# Patient Record
Sex: Male | Born: 1960 | Race: White | Hispanic: No | Marital: Married | Smoking: Never smoker
Health system: Southern US, Community
[De-identification: ages and names within clinical notes are randomized; demographics above are authoritative.]

## PROBLEM LIST (undated history)

## (undated) DIAGNOSIS — R0600 Dyspnea, unspecified: Secondary | ICD-10-CM

## (undated) DIAGNOSIS — T8859XA Other complications of anesthesia, initial encounter: Secondary | ICD-10-CM

## (undated) DIAGNOSIS — I4891 Unspecified atrial fibrillation: Secondary | ICD-10-CM

## (undated) DIAGNOSIS — N289 Disorder of kidney and ureter, unspecified: Secondary | ICD-10-CM

## (undated) DIAGNOSIS — M1990 Unspecified osteoarthritis, unspecified site: Secondary | ICD-10-CM

## (undated) DIAGNOSIS — T4145XA Adverse effect of unspecified anesthetic, initial encounter: Secondary | ICD-10-CM

## (undated) DIAGNOSIS — M109 Gout, unspecified: Secondary | ICD-10-CM

## (undated) DIAGNOSIS — N186 End stage renal disease: Secondary | ICD-10-CM

## (undated) DIAGNOSIS — R7303 Prediabetes: Secondary | ICD-10-CM

## (undated) DIAGNOSIS — C4492 Squamous cell carcinoma of skin, unspecified: Secondary | ICD-10-CM

## (undated) DIAGNOSIS — I5032 Chronic diastolic (congestive) heart failure: Secondary | ICD-10-CM

## (undated) DIAGNOSIS — I10 Essential (primary) hypertension: Secondary | ICD-10-CM

## (undated) DIAGNOSIS — H269 Unspecified cataract: Secondary | ICD-10-CM

## (undated) DIAGNOSIS — R42 Dizziness and giddiness: Secondary | ICD-10-CM

## (undated) DIAGNOSIS — I77810 Thoracic aortic ectasia: Secondary | ICD-10-CM

## (undated) DIAGNOSIS — E785 Hyperlipidemia, unspecified: Secondary | ICD-10-CM

## (undated) DIAGNOSIS — I480 Paroxysmal atrial fibrillation: Secondary | ICD-10-CM

## (undated) DIAGNOSIS — Z992 Dependence on renal dialysis: Secondary | ICD-10-CM

## (undated) DIAGNOSIS — N2881 Hypertrophy of kidney: Secondary | ICD-10-CM

## (undated) DIAGNOSIS — I499 Cardiac arrhythmia, unspecified: Secondary | ICD-10-CM

## (undated) HISTORY — DX: Squamous cell carcinoma of skin, unspecified: C44.92

## (undated) HISTORY — DX: Unspecified cataract: H26.9

## (undated) HISTORY — PX: EYE SURGERY: SHX253

## (undated) HISTORY — PX: TOTAL NEPHRECTOMY: SHX415

## (undated) HISTORY — DX: Dizziness and giddiness: R42

## (undated) HISTORY — DX: Unspecified atrial fibrillation: I48.91

## (undated) HISTORY — DX: Disorder of kidney and ureter, unspecified: N28.9

## (undated) HISTORY — PX: AV FISTULA PLACEMENT: SHX1204

## (undated) HISTORY — DX: Hypertrophy of kidney: N28.81

## (undated) HISTORY — PX: OTHER SURGICAL HISTORY: SHX169

## (undated) HISTORY — DX: Paroxysmal atrial fibrillation: I48.0

## (undated) HISTORY — PX: HERNIA REPAIR: SHX51

---

## 1999-08-03 ENCOUNTER — Emergency Department (HOSPITAL_COMMUNITY): Admission: EM | Admit: 1999-08-03 | Discharge: 1999-08-03 | Payer: Self-pay | Admitting: Emergency Medicine

## 1999-12-02 HISTORY — PX: KNEE SURGERY: SHX244

## 2000-12-03 ENCOUNTER — Emergency Department (HOSPITAL_COMMUNITY): Admission: EM | Admit: 2000-12-03 | Discharge: 2000-12-03 | Payer: Self-pay | Admitting: Emergency Medicine

## 2000-12-03 ENCOUNTER — Encounter: Payer: Self-pay | Admitting: Emergency Medicine

## 2000-12-04 ENCOUNTER — Encounter: Payer: Self-pay | Admitting: Orthopedic Surgery

## 2000-12-05 ENCOUNTER — Encounter (INDEPENDENT_AMBULATORY_CARE_PROVIDER_SITE_OTHER): Payer: Self-pay | Admitting: Specialist

## 2000-12-05 ENCOUNTER — Observation Stay (HOSPITAL_COMMUNITY): Admission: RE | Admit: 2000-12-05 | Discharge: 2000-12-06 | Payer: Self-pay | Admitting: Orthopedic Surgery

## 2001-08-15 ENCOUNTER — Emergency Department (HOSPITAL_COMMUNITY): Admission: EM | Admit: 2001-08-15 | Discharge: 2001-08-15 | Payer: Self-pay | Admitting: Emergency Medicine

## 2001-11-20 ENCOUNTER — Inpatient Hospital Stay (HOSPITAL_COMMUNITY): Admission: EM | Admit: 2001-11-20 | Discharge: 2001-11-23 | Payer: Self-pay | Admitting: Emergency Medicine

## 2001-11-20 ENCOUNTER — Encounter: Payer: Self-pay | Admitting: Cardiology

## 2006-08-10 ENCOUNTER — Emergency Department (HOSPITAL_COMMUNITY): Admission: EM | Admit: 2006-08-10 | Discharge: 2006-08-10 | Payer: Self-pay | Admitting: Emergency Medicine

## 2007-05-14 ENCOUNTER — Emergency Department (HOSPITAL_COMMUNITY): Admission: EM | Admit: 2007-05-14 | Discharge: 2007-05-14 | Payer: Self-pay | Admitting: Emergency Medicine

## 2008-08-08 ENCOUNTER — Ambulatory Visit: Payer: Self-pay | Admitting: Cardiovascular Disease

## 2008-08-15 ENCOUNTER — Ambulatory Visit: Payer: Self-pay

## 2008-08-22 ENCOUNTER — Ambulatory Visit: Payer: Self-pay

## 2009-05-04 ENCOUNTER — Encounter (INDEPENDENT_AMBULATORY_CARE_PROVIDER_SITE_OTHER): Payer: Self-pay | Admitting: *Deleted

## 2010-06-04 ENCOUNTER — Encounter (INDEPENDENT_AMBULATORY_CARE_PROVIDER_SITE_OTHER): Payer: Self-pay | Admitting: *Deleted

## 2010-12-31 NOTE — Letter (Signed)
Summary: Previsit letter  [facility]  [place], [place] [address]   Phone: [phone]  Fax: [phone]       06/04/2010 MRN: [id number]  [address] [place], [place]  [address]  Dear Mr. Verastegui,  Welcome to the Gastroenterology Division at [facility].    You are scheduled to see a nurse for your pre-procedure visit on June 17, 2010 at 1:30 PM on the 3rd floor at [facility] [place].  We ask that you try to arrive at our office 15 minutes prior to your appointment time to allow for check-in.  Your nurse visit will consist of discussing your medical and surgical history, your immediate family medical history, and your medications.    Please bring a complete list of all your medications or, if you prefer, bring the medication bottles and we will list them.  We will need to be aware of both prescribed and over the counter drugs.  We will need to know exact dosage information as well.  If you are on blood thinners (Coumadin, Plavix, Aggrenox, Ticlid, etc.) please call our office today/prior to your appointment, as we need to consult with your physician about holding your medication.   Please be prepared to read and sign documents such as consent forms, a financial agreement, and acknowledgement forms.  If necessary, and with your consent, a friend or relative is welcome to sit-in on the nurse visit with you.  Please bring your insurance card so that we may make a copy of it.  If your insurance requires a referral to see a specialist, please bring your referral form from your primary care physician.  No co-pay is required for this nurse visit.     If you cannot keep your appointment, please call [phone] to cancel or reschedule prior to your appointment date.  This allows [place] the opportunity to schedule an appointment for another patient in need of care.    Thank you for choosing [facility] for your  medical needs.  We appreciate the opportunity to care for you.  Please visit [place] at our website  to learn more about our practice.                     Sincerely.                                                                                                                   The Gastroenterology Division

## 2011-03-24 ENCOUNTER — Ambulatory Visit (INDEPENDENT_AMBULATORY_CARE_PROVIDER_SITE_OTHER): Payer: 59 | Admitting: Surgery

## 2011-03-24 ENCOUNTER — Encounter (INDEPENDENT_AMBULATORY_CARE_PROVIDER_SITE_OTHER): Payer: 59

## 2011-03-24 DIAGNOSIS — N186 End stage renal disease: Secondary | ICD-10-CM

## 2011-03-24 DIAGNOSIS — N184 Chronic kidney disease, stage 4 (severe): Secondary | ICD-10-CM

## 2011-03-24 DIAGNOSIS — Z01810 Encounter for preprocedural cardiovascular examination: Secondary | ICD-10-CM

## 2011-03-25 NOTE — Assessment & Plan Note (Signed)
OFFICE VISIT  [name] [name] DOB:  [birth year]                                       03/24/2011 CHART#:[id number]  REASON FOR VISIT:  Dialysis access.  HISTORY:  This is a very pleasant 50 year old gentleman I am seeing at the request of Dr. Lorrene [name] for dialysis access.  The patient is not yet on dialysis.  He is right-handed.  His renal failure is secondary to presumed hypertension.  The patient suffers from hypertension and hypercholesterolemia which are medically managed.  REVIEW OF SYSTEMS:  CARDIOVASCULAR:  Positive for remote history of arrhythmia. MUSCULOSKELETAL:  Positive for arthritis and joint pain.  PAST MEDICAL HISTORY:  Hypertension, hypercholesterolemia, heart murmur (Dr. Johnsie [name] is his cardiologist).  PAST SURGICAL HISTORY:  Right knee repair of ruptured patella tendon and cardiac catheterization in 2001.  SOCIAL HISTORY:  He is married, 2 children.  Does not drink or smoke.  FAMILY HISTORY:  Positive for heart disease in his mother.  ALLERGIES:  None.  PHYSICAL EXAMINATION:  Vital signs:  Heart rate 60, blood pressure 120/82, O2 sats 99%.  General:  Well-appearing, in no distress. Respirations:  Nonlabored.  Cardiovascular:  Palpable radial and left brachial pulse.  Musculoskeletal:  No major deformities.  Neurological: No focal deficits.  DIAGNOSTIC STUDIES:  Vein mapping was performed today.  He has an adequate cephalic vein beginning at the antecubital crease.  ASSESSMENT AND PLAN:  Chronic renal insufficiency needing permanent access.  Based on the patient's vein mapping I think he would be a good candidate for a left upper arm AV fistula.  I discussed the risks and benefits of the procedure including the risk of nonmaturity and the need for future interventions, the risk of steal syndrome.  I discussed that I may make parallel longitudinal incisions which would be dictated based on the location of the artery and vein.   This will be a decision made in the operating room.  We have scheduled this operation for May 18.    [name] Abrahams, MD Electronically Signed  VWB/MEDQ  D:  03/24/2011  T:  03/25/2011  Job:  [id number]  cc:   [name] [name]. [name] [name], M.D.

## 2011-04-08 NOTE — Procedures (Unsigned)
CEPHALIC VEIN MAPPING  INDICATION:  Preoperative vein mapping for AVF placement.  HISTORY: Chronic kidney disease stage 4, hypertension, hyperlipidemia.  EXAM: The right cephalic vein is compressible with diameter measurements ranging from 0.28 to 0.60 cm.  The right basilic vein is compressible with diameter measurements ranging from 0.27 to 0.37 cm.  The left cephalic vein is compressible with diameter measurements ranging from 0.10 to 0.47 cm.  The left basilic vein is compressible with diameter measurements ranging from 0.25 to 0.29 cm.  See attached worksheet for all measurements.  IMPRESSION:  Patent right and left cephalic and basilic veins with diameter measurements as described above.  ___________________________________________ V. Leia Alf, MD  EM/MEDQ  D:  03/24/2011  T:  03/24/2011  Job:  [id number]

## 2011-04-14 ENCOUNTER — Other Ambulatory Visit (HOSPITAL_COMMUNITY): Payer: 59

## 2011-04-15 ENCOUNTER — Encounter (HOSPITAL_COMMUNITY)
Admission: RE | Admit: 2011-04-15 | Discharge: 2011-04-15 | Disposition: A | Discharge status: Home / Self Care | Payer: 59 | Source: Ambulatory Visit | Attending: Surgery | Admitting: Surgery

## 2011-04-15 ENCOUNTER — Other Ambulatory Visit: Payer: Self-pay | Admitting: Surgery

## 2011-04-15 ENCOUNTER — Ambulatory Visit (HOSPITAL_COMMUNITY)
Admission: RE | Admit: 2011-04-15 | Discharge: 2011-04-15 | Disposition: A | Discharge status: Home / Self Care | Payer: 59 | Source: Ambulatory Visit | Attending: Surgery | Admitting: Surgery

## 2011-04-15 DIAGNOSIS — N186 End stage renal disease: Secondary | ICD-10-CM

## 2011-04-15 DIAGNOSIS — Z01818 Encounter for other preprocedural examination: Secondary | ICD-10-CM | POA: Insufficient documentation

## 2011-04-15 DIAGNOSIS — Z01812 Encounter for preprocedural laboratory examination: Secondary | ICD-10-CM | POA: Insufficient documentation

## 2011-04-15 DIAGNOSIS — Z01810 Encounter for preprocedural cardiovascular examination: Secondary | ICD-10-CM | POA: Insufficient documentation

## 2011-04-15 DIAGNOSIS — I10 Essential (primary) hypertension: Secondary | ICD-10-CM | POA: Insufficient documentation

## 2011-04-15 LAB — POCT I-STAT 4, (NA,K, GLUC, HGB,HCT)
Glucose, Bld: 99 mg/dL (ref 70–99)
HCT: 45 % (ref 39.0–52.0)
Hemoglobin: 15.3 g/dL (ref 13.0–17.0)
Potassium: 3.9 mEq/L (ref 3.5–5.1)
Sodium: 142 mEq/L (ref 135–145)

## 2011-04-15 LAB — SURGICAL PCR SCREEN
MRSA, PCR: NEGATIVE
Staphylococcus aureus: POSITIVE — AB

## 2011-04-15 NOTE — Assessment & Plan Note (Signed)
[place] OFFICE NOTE   Arihaan, Pyatt Nathanial                     MRN:          [id number]  DATE:08/08/2008                            DOB:          1961/04/01    HISTORY OF PRESENT ILLNESS:  [name] is referred today by our  Primary Care Department and also [facility].  He was recently  hospitalized for paroxysmal atrial fibrillation.  I receive records from  [facility] and the time of the review 20 minutes, approximately  35 pages.  The patient was admitted on July 25, 2008 and discharged on  July 27, 2008.  He had substernal chest pain and palpitations.  He was  found to be in rapid atrial fibrillation.  He subsequently converted on  Cardizem.   He had a Mali score of 2 and was not sent home on Coumadin.  There was  some issue regarding taking a very high-dose aspirin.   The patient previously had been seen by me in 2003 for an episode of  atrial fibrillation.  He has not had a recurrence since that time.  He  has been hypertensive with mild chronic renal insufficiency.  He had a  cath back in 2003, which showed no significant coronary artery disease.  He had a MUGA scan, showed an EF of 64%.   Coronary risk factors include hyperlipidemia and hypertension.   Reviewing the records from [facility], he had an echocardiogram  that was essentially normal.  He did have septal thickness of 15 mm.   The patient and his wife do not want to be on Coumadin.  They understand  the relative risk and benefits, given the Mali score 2 and 2 isolated  episodes, I would agree.   However, the patient was apparently told not to go home on some blood  pressure medicine.  I told him that, I thought it was really important  for him to keep his blood pressure under good control since he does have  LVH by echocardiography.  It is not clear to me that his blood pressures  has been properly  controlled.   He is also under a lot of stress.  His daughter from a first marriage is  getting married on Friday and he is not going to be walking her down the  aisle, was not invited to be part of the wedding, this has caused quite  a bit of stress.  He is trying to work through this and we talked about  it for while.  Otherwise, he did have significant chest pressure when he  had his rapid atrial fibrillation.  He ruled out for myocardial  infarction, did not have a wall motion abnormality, but he needs a  followup stress test.  His baseline EKG after conversion showed  nonspecific ST-T wave changes.  I have told him, I think it would be  reasonable to have a followup stress Myoview to assess his heart rate  and blood pressure response to exercise and rule out coronary artery  disease, given his multiple risk factors.  His the past medical history  is otherwise remarkable for an isolated episode of PAF 8 years ago,  hypertension, hyperlipidemia, and gout.  He has mild chronic renal  insufficiency.  He has had previous right knee surgery for meniscus  tear.   He is happily married.  He has children with the previous wife, which  has caused some stress recently.  He does not smoke or drink.  He is  fairly sedentary.  He is up over 300 pounds.  We talked about his diet  some.  Interestingly, he does not have a lot of carbohydrates.   FAMILY HISTORY:  Noncontributory.   ALLERGIES:  He has no known allergies.   CURRENT MEDICATIONS:  1. Micardis 40 a day.  2. Lisinopril and HCTZ 20/25.  3. Lipitor 10 a day.  4. TriCor 145 a day.  5. Amlodipine 10 a day.  6. Allopurinol 100 a day.  7. Baby aspirin.   PHYSICAL EXAMINATION:  GENERAL:  Remarkable for an overweight white  male, in no distress.  VITAL SIGNS:  His weight is 305 pounds, pulse is 70 and regular, blood  pressure 140/80, respiratory rate 14, afebrile.  HEENT:  Unremarkable.  NECK:  Carotids are normal without bruit, no  lymphadenopathy, no  thyromegaly, no JVP elevation.  LUNGS:  Clear.  Good diaphragmatic motion.  No wheezing.  S1 and S2.  Normal heart sounds.  PMI normal.  ABDOMEN:  Benign.  Bowel sounds positive.  No AAA, no tenderness, no  bruit, no hepatosplenomegaly, no hepatojugular reflux, no tenderness, no  bruit.  EXTREMITIES:  Distal pulses are intact.  No edema.  NEURO:  Nonfocal.  SKIN:  Warm and dry.  MUSCULOSKELETAL:  No muscular weakness.   IMPRESSION:  1. Paroxysmal atrial fibrillation.  Mali score 2.  Continue aspirin      and beta-blocker therapy.  2. Chest pain.  Multiple coronary risk factors, nonspecific ST-T wave      changes on baseline EKG.  Followup stress Myoview.  3. Hyperlipidemia.  Continue statin drug.  Lipid and liver profile in      6 months.  4. Anxiety and stress, situational.  Encouraged to talk over the      situation with his daughter.  We would be happy to prescribe p.r.n.      Xanax and selective serotonin reuptake inhibitor in the short term.      He will call me if he thinks he needs it.  Overall, I think it is a      right decision      not to initiate Coumadin therapy and I will see him back in 6      months so long his stress Myoview is nonischemic.     Wallis Bamberg. Johnsie Cancel, MD, [facility]  Electronically Signed    PCN/MedQ  DD: 08/08/2008  DT: 08/09/2008  Job #: [id number]   cc:   Heinz Knuckles. Norins, MD

## 2011-04-18 ENCOUNTER — Ambulatory Visit (HOSPITAL_COMMUNITY)
Admission: RE | Admit: 2011-04-18 | Discharge: 2011-04-18 | Disposition: A | Discharge status: Home / Self Care | Payer: 59 | Source: Ambulatory Visit | Attending: Surgery | Admitting: Surgery

## 2011-04-18 DIAGNOSIS — N186 End stage renal disease: Secondary | ICD-10-CM

## 2011-04-18 DIAGNOSIS — Z01812 Encounter for preprocedural laboratory examination: Secondary | ICD-10-CM | POA: Insufficient documentation

## 2011-04-18 DIAGNOSIS — N189 Chronic kidney disease, unspecified: Secondary | ICD-10-CM | POA: Insufficient documentation

## 2011-04-18 DIAGNOSIS — E669 Obesity, unspecified: Secondary | ICD-10-CM | POA: Insufficient documentation

## 2011-04-18 DIAGNOSIS — Z01818 Encounter for other preprocedural examination: Secondary | ICD-10-CM | POA: Insufficient documentation

## 2011-04-18 DIAGNOSIS — I129 Hypertensive chronic kidney disease with stage 1 through stage 4 chronic kidney disease, or unspecified chronic kidney disease: Secondary | ICD-10-CM | POA: Insufficient documentation

## 2011-04-18 DIAGNOSIS — I120 Hypertensive chronic kidney disease with stage 5 chronic kidney disease or end stage renal disease: Secondary | ICD-10-CM

## 2011-04-18 DIAGNOSIS — Z01810 Encounter for preprocedural cardiovascular examination: Secondary | ICD-10-CM | POA: Insufficient documentation

## 2011-04-18 LAB — POCT I-STAT 4, (NA,K, GLUC, HGB,HCT)
HCT: 45 % (ref 39.0–52.0)
Hemoglobin: 15.3 g/dL (ref 13.0–17.0)
Potassium: 3.8 mEq/L (ref 3.5–5.1)

## 2011-04-18 NOTE — Discharge Summary (Signed)
[place]. [facility]  Patient:    [name] [name], [name] [name] Visit Number: [id number] MRN: [id number]          Service Type: MED Location: [phone] Attending Physician:  Wadie Lessen Dictated by:   Mannie Stabile, P.A. Admit Date:  11/20/2001 Discharge Date: 11/23/2001   CC:         [name] [name], M.D.   Referring Physician Discharge [place]  REASON FOR ADMISSION:  [name] [name] is a 49 year old male with history of reportedly normal cardiac catheterization approximately four years ago and cardiac risk factors notable for hypertension and family history of coronary artery disease who presented with new-onset tachypalpitations associated with lightheadedness.  Given its persistence, he presented to the emergency room and was found in atrial fibrillation with ventricular response of approximately 140 BPM.  Please refer to dictated admission note for full details.  LABORATORY DATA:  Cardiac enzymes:  Elevated CPK 379/MB 6 (1.6%); troponin I 0.02, 0.01.  TSH 1.29.  Serial CBCs normal.  Elevated potassium on admission (7.4) - hemolyzed.  BUN 30/creatinine 1.9 on admission - 19/1.5 respectively at discharge.  Heparin-associated antibodies pending.  Admission CXR:  Negative.  HOSPITAL COURSE:  Patient was admitted for management and evaluation of new-onset atrial fibrillation with rapid ventricular response.  Following a loading dose of 20 mg IV diltiazem, he was placed on diltiazem drip.  He subsequently converted to NSR while in the emergency room and remained in normal rhythm thereafter.  In fact, he developed a sinus bradycardia and was taken off any A-V nodal blocking agents.  Serial cardiac enzymes were negative for myocardial ischemia.  Of note, patient had also received an initial dose of 0.5 mg IV digoxin while in the emergency room.  Cardiac workup consisted of a 2-D echocardiogram which suggested mild LV dysfunction.  This was reviewed by  Dr. Johnsie Cancel who recommended following this up with an outpatient MUGA scan for assessment of ejection fraction.  Recommended medications at time of discharge:  Low-dose aspirin and Altace.  DISCHARGE MEDICATIONS: 1. Coated aspirin 81 mg q.d. 2. Altace 5 mg q.d.  INSTRUCTIONS:  Patient will have a MUGA scan on January 07, 2002 at 2 p.m. for assessment of ejection fraction.  He is then scheduled to follow up with Dr. Johnsie Cancel at 4:15 p.m., later that same day.  DISCHARGE DIAGNOSES: 1. New-onset paroxysmal atrial fibrillation with rapid ventricular response.    a. Converted to sinus bradycardia after intravenous diltiazem and digoxin.    b. Mild left ventricular dysfunction (2-D echocardiogram) - follow up MUGA       scan recommended. 2. History of normal coronary arteries/left ventricular function.    Cardiac catheterization March 1998. 3. Hypertension. 4. Mild renal insufficiency. 5. History of gout. Dictated by:   Mannie Stabile, P.A. Attending Physician:  Wadie Lessen DD:  11/23/01 TD:  11/23/01 Job: [id number]

## 2011-04-18 NOTE — H&P (Signed)
[place]. [facility]  Patient:    [name] [name], [name] [name] Visit Number: [id number] MRN: [id number]          Service Type: MED Location: [phone] Attending Physician:  Wadie Lessen Dictated by:   Loretha Brasil Lia Foyer, M.D. [facility] Admit Date:  11/20/2001   CC:         [name], M.D.   History and Physical  CHIEF COMPLAINT: Palpitations.  HISTORY OF PRESENT ILLNESS: Kyrion is a very pleasant 50 year old white male, who has had previous cardiac catheterization four years ago, but no subsequent follow-up.  He was not told of any problems.  He has had a history of hypertension and has been followed by Dr. Lorrene Reid.  Today he presented as at about 10:30 p.m. last night while sitting had light headedness and some nausea.  He denies any chest pain.  He slept but awoke with persistent palpitations this morning and came to the emergency room, where EKG revealed atrial fibrillation with rapid ventricular response.  He was given intravenous diltiazem in the emergency room with reduction in his heart rate to 90.  He denies any exertional chest pain.  He had walking pneumonia about two weeks ago and was given Z-Pak for this.  PAST MEDICAL HISTORY:  1. Hypertension x 4 years.  2. Prior cardiac catheterization.  3. Right knee surgery for patella rupture.  4. Gout.  MEDICATIONS:  1. Norvasc 10 mg q.d.  2. Prinzide 10/12.5 mg b.i.d.  3. Allopurinol 100 mg q.d.  ALLERGIES: No known drug allergies.  SOCIAL HISTORY: The patient is married.  He has two children.  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  He, unfortunately, drinks three liters of Diet Coke daily.  FAMILY HISTORY: Mother in her 82s and has CAD and prior percutaneous intervention.  REVIEW OF SYSTEMS: He denies fever, chills, sweats, or weight loss.  HEENT: Unremarkable.  There has been mild dyspnea with effort.  GU:  Negative. NEUROLOGIC/PSYCHOLOGIC/MUSCULOSKELETAL: Negative.  GI: No bleeding.  All other systems were negative.  There is no prior history of rheumatic fever.  PHYSICAL EXAMINATION:  GENERAL: The patient is an alert and oriented, well-developed, well-nourished male in no acute distress.  VITAL SIGNS: Temperature 98.7 degrees, pulse 83, respiratory rate 20, blood pressure 142/63.  HEENT: PERRL.  EOMI.  Sclerae clear.  NECK: No carotid bruits.  Neck supple.  Upstrokes brisk.  No lymphadenopathy.  HEART: Cardiac rhythm regular with normal first and second heart sound, and no significant murmur, rub, or gallop.  CHEST: Lung fields clear to auscultation and percussion.  ABDOMEN: No obvious masses.  Slight protuberance without significant bruits. No hepatosplenomegaly noted.  EXTREMITIES: No rashes.  No edema.  NEUROLOGIC: Nonfocal.  RECTAL: Deferred.  LABORATORY DATA: Hemoglobin 18, hematocrit 52.  Sodium 136, potassium 3.9, chloride 111, CO2 23, BUN 30, creatinine 1.9, glucose 109.  EKG reveals atrial fibrillation with rapid ventricular response and minor nonspecific ST-T wave abnormalities; vertical axis noted.  Chest x-ray pending.  IMPRESSION:  1. New onset paroxysmal atrial fibrillation with rapid ventricular response,     less than 24 hours in duration.  2. History of hypertension.  3. Mild renal insufficiency, question secondary to hypertension and     medications.  4. Mild elevation in hemoglobin.  5. Excess Diet Coke use.  6. Recent pneumonia.  PLAN:  1. Lanoxin load and addition of Cardizem to regimen.  2. Hold other antihypertensive medications at the  present time.  3. Consult Dr. Jamal Maes with regard to the patients allopurinol.  4. Add heparin to regimen.  5. Check thyroid function studies.  6. Obtain 2D echocardiogram.  7. Oral diltiazem for rate control. Dictated by:   Loretha Brasil Lia Foyer, M.D. [place] Attending Physician:  Wadie Lessen DD:   10/21/01 TD:  11/22/01 Job: [id number]

## 2011-04-18 NOTE — Op Note (Signed)
[facility]  Patient:    [name] [name], [name] [name]                     MRN: [id number] Proc. Date: 12/05/00 Adm. Date:  [id number] Disc. Date: [id number] Attending:  Wynona Meals K                           Operative Report  PREOPERATIVE DIAGNOSIS:  Torn patellar tendon, right knee.  POSTOPERATIVE DIAGNOSIS:  Avulsion of portion of tibial tubercle with disruption of patellar tendon, right knee.  OPERATION:  Removal of avulsed portion of tibial tubercle and repair of patellar tendon, right knee.  SURGEON:  Laurice Record. [place]ton, M.D.  ASSISTANT:  Nurse.  ANESTHESIA:  General.  PATHOLOGY AND JUSTIFICATION FOR PROCEDURE:  He had never had any problems with his right knee until the night of December 02, 2000 when he slipped on the ice at work, twisting his right knee.  He was seen by Dr. Arlan Organ. Doran Durand. in the office on December 03, 2000, with considerable pain in the right knee and was noted to have a sulcus in the patellar tendon and was very tender over the tibial tubercle.  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  He is here today for repair; see operative description below for additional details and pathology.  DESCRIPTION OF PROCEDURE:  After satisfactory general anesthesia and pneumatic tourniquet, DuraPrep from tourniquet to ankle, he was draped in a sterile field.  A vertical midline incision from roughly the midportion of the patella down to slightly past the tibial tubercle and went down through the tendon and inspected the patellar tendon.  It was intact with no evidence of injury proximally.  Distally, I could feel motion of the attachment of the patellar tendon and I opened the tendon vertically at the area of motion and sulcus and found a 2 x 1-cm fragment with a good bit of yellowish fluid  present, indicating injury where it had avulsed off the parent tibial tubercle. With sharp dissection, I excised this fragment and then reconstituted the defect in the patellar tendon with interrupted #1 Vicryl sutures deep and a second layer superficially.  Distally, I wanted to advance the tendon and I placed a Mitek SuperAnchor in the tibial tubercle and brought the two limbs proximally on the two halves of the patellar tendon so that it cinched it down.  I could pull this distally and then I also crossed them in figure-of-eight fashion so that we had a double fixation and knot; this gave a nice smooth patellar tendon with the previous defect corrected.  I then buttressed this distally with several more sutures using the wings of the Mitek anchor suture.  The wound was then once again irrigated with antibiotic solution, as we had throughout the case.  The tendon sheath was reapproximated with interrupted 2-0 Vicryls and the subcutaneous tissue and skin with staples.  Betadine, Adaptic, dry sterile dressing and knee immobilizer were applied.  He tolerated the procedure well and was taken to the recovery room in satisfactory condition with no known complications.  Just prior to completing the skin staples, I did place 0.5% Marcaine with Adrenalin in the wound.  DD:  12/05/00 TD:  12/05/00 Job: [id number]

## 2011-04-22 NOTE — Op Note (Signed)
NAMEHERSHEY, CURCIO            ACCOUNT NO.:  [id number]  MEDICAL RECORD NO.:  [id number]           PATIENT TYPE:  O  LOCATION:  [facility]                         FACILITY:  [facility]  PHYSICIAN:  Theotis Burrow IV, MDDATE OF BIRTH:  [birth year]  DATE OF PROCEDURE:  04/18/2011 DATE OF DISCHARGE:  04/18/2011                              OPERATIVE REPORT   PREOPERATIVE DIAGNOSIS:  Chronic renal insufficiency.  POSTOPERATIVE DIAGNOSIS:  Chronic renal insufficiency.  PROCEDURE PERFORMED: 1. Exploration left cephalic vein. 2. Left basilic vein transposition.  ANESTHESIA:  MAC.  SURGEON: 1. Leia Alf, MD  ASSISTANT:  Evorn Gong, PA  BLOOD LOSS:  Minimal.  FINDINGS:  The cephalic vein was occluded and not usable for the cephalic vein fistula.  Therefore, basilic vein transposition was performed.  INDICATIONS:  This is a 50 year old gentleman now here on renal replacement therapy who requires fistula for future access.  PROCEDURE:  The patient was identified in the holding area and taken to room 8, placed supine on the table.  MAC anesthesia was administered. The patient was prepped and draped in usual fashion.  A time-out was called.  Antibiotics were given.  Ultrasound was used to map the course of the cephalic vein of the upper arm.  A transverse incision was made above the antecubital crease.  The cephalic vein was identified within the incision and mobilized proximally and distally.  Once adequate vein had been exposed, the brachial artery was dissected out sharply.  This was a 3.5-mm artery without significant disease.  The patient was heparinized.  I placed a right-angle on the distal cephalic vein and transected.  The distal end was ligated with 2-0 silk tie.  I then went to instill the vein with heparinized saline, however, there was no lumen.  I therefore transected the vein more proximal and again it was without a lumen.  It was chronically occluded and  I finally was able to get to a lumen within the cephalic vein.  The vein was too short to reach the brachial artery.  At this point, the patient was deemed not to be a candidate for cephalic vein fistula.  I have not discussed proceeding with basilic vein transposition.  At this time, I did evaluate the basilic vein with ultrasound.  It was a robust looking vein approximately 4-5 mm by ultrasound.  At this point, I left the room and met with the patient's wife in a counseling room.  I told her the findings and told her the options which would be termination of the procedure and bring him back for basilic vein transposition which would be the most likely.  The next option will be going ahead and proceeding with this at this time.  We felt that proceeding with the basilic vein transposition was warranted in this scenario.  I then went back to the room.  The patient at this time was interactive and therefore I told him the findings and expressed proceeding with basilic vein transposition and he agreed.  Through the medial incisions in the arm the basilic vein was exposed and circumferentially dissected free ligating the side branches with 3-0  silk ties and metal clips.  The vein was very robust at least 4-5 mm throughout its course.  It was dissected out up to the axilla where it joined the brachial vein.  Once the basilic vein was fully mobilized, the right angle was placed distally.  The vein was transected, the distal end was ligated with 2-0 silk tie.  The vein was brought out onto the field and with a serrefine clamp at the basilic brachial junction the vein was instilled with heparinized saline.  It dilated nicely and was marked with an ink pen to ensure proper orientation.  Next, a straight tunneler was used to create a tunnel in the subcutaneous tissue.  The patient was re-heparinized.  The artery was occluded and then 11 blade was used to make an arteriotomy which was extended  longitudinally with Potts scissors.  The vein was then cut to the appropriate length and beveled to fit the size of the arteriotomy. A running anastomosis was created with 6-0 Prolene.  Prior to completion, appropriate flush maneuvers were performed and the anastomosis was completed.  There was an excellent thrill within the fistula.  There were no kinks present.  The patient had preservation of palpable radial pulse.  At this point in time, the patient's heparin was reversed with protamine.  The incisions were closed with two layers of 3- 0 Vicryl and Dermabond was placed to the wound.  The patient tolerated the procedure well and there were no complications. Dictating X     V. Leia Alf, MD     VWB/MEDQ  D:  04/20/2011  T:  04/21/2011  Job:  [id number]  Electronically Signed by Orvan Falconer IV MD on 04/22/2011 10:11:23 PM

## 2011-05-01 ENCOUNTER — Ambulatory Visit: Payer: 59

## 2011-05-01 NOTE — Assessment & Plan Note (Signed)
OFFICE VISIT  [name], [name] DOB:  July 17, 1961                                       05/01/2011 CHART#:[id number]  DATE OF SURGERY:  Apr 18, 2011.  The patient is a 50 year old gentleman who presents for evaluation of left basilic vein transposition.  The patient had an exploration of the left cephalic vein and left basilic vein transposition performed on Apr 18, 2011 in anticipation of need for hemodialysis in the future.  The patient's cephalic vein was occluded at the time and was not usable for fistula; therefore, the basilic vein transposition was performed.  The patient presented for evaluation by his nephrologist yesterday, who stated that she did not hear a bruit in the fistula and requested evaluation today.  The patient denies all signs and symptoms of steal. Again, he is not on dialysis at this time.  PHYSICAL EXAMINATION:  Blood pressure 121/78, right arm sitting, heart rate 87, O2 saturation 99% on room air.  In general, he is well- nourished in no acute distress.  There is a 2+ left radial pulse noted. The hand is warm and well-perfused.  There is no thrill or bruit present in the basilic vein transposition.  The incisions are clean, dry, and intact and healing well.  There is no edema in the left upper extremity.  ASSESSMENT/PLAN:  The patient's fistula has failed.  He is not on hemodialysis yet and has a follow-up appointment scheduled with Dr. Trula Slade for routine evaluation on May 12, 2011.  The patient has been instructed to continue to proceed with this follow-up appointment, at which time Dr. Trula Slade will make a decision regarding whether or not to proceed with additional access procedures at this time or wait until the patient's dialysis is imminent.  The patient understands and is satisfied with this plan.  Leta Baptist, PA  Charles E. Fields, MD Electronically Signed  AY/MEDQ  D:  05/01/2011  T:  05/01/2011  Job:   [id number]

## 2011-05-12 ENCOUNTER — Ambulatory Visit (INDEPENDENT_AMBULATORY_CARE_PROVIDER_SITE_OTHER): Payer: 59 | Admitting: Surgery

## 2011-05-12 ENCOUNTER — Ambulatory Visit: Payer: 59 | Admitting: Surgery

## 2011-05-12 DIAGNOSIS — N186 End stage renal disease: Secondary | ICD-10-CM

## 2011-05-12 DIAGNOSIS — T82898A Other specified complication of vascular prosthetic devices, implants and grafts, initial encounter: Secondary | ICD-10-CM

## 2011-05-13 NOTE — Assessment & Plan Note (Signed)
OFFICE VISIT  Gillott, Rockney H DOB:  12-27-1960                                       05/12/2011 CHART#:[id number]  The patient is a 50 year old gentleman not yet on renal replacement therapy.  He underwent left basilic vein transposition on 04/18/2011. Unfortunately this occluded.  He did develop the flu a week after his operation.  I cannot think of any other explanation as to why this occluded.  Nonetheless he needs new access.  Based on his vein mapping I think he would be a candidate for right radiocephalic fistula.  His most recent creatinine was 2.7.  He is scheduled to see Dr. Lorrene Reid in July. He would like to talk with her and then call and schedule the fistula following that.  I think that sounds reasonable.  I am clearing him to go back to work in 1 month because of his left arm surgery.    Eldridge Abrahams, MD Electronically Signed  VWB/MEDQ  D:  05/12/2011  T:  05/13/2011  Job:  [id number]  cc:   Elzie Rings. Lorrene Reid, M.D.

## 2011-05-21 ENCOUNTER — Ambulatory Visit: Payer: 59

## 2011-05-22 ENCOUNTER — Ambulatory Visit: Payer: 59

## 2011-09-18 LAB — CBC
HCT: 46.1
Platelets: 211
WBC: 8.7

## 2011-09-18 LAB — DIFFERENTIAL
Basophils Absolute: 0
Basophils Relative: 0
Eosinophils Relative: 3
Monocytes Absolute: 0.4
Monocytes Relative: 5

## 2011-09-18 LAB — COMPREHENSIVE METABOLIC PANEL
ALT: 40
AST: 33
Albumin: 4
Alkaline Phosphatase: 36 — ABNORMAL LOW
BUN: 24 — ABNORMAL HIGH
Chloride: 109
GFR calc Af Amer: 42 — ABNORMAL LOW
Potassium: 4.1
Sodium: 142
Total Bilirubin: 1.4 — ABNORMAL HIGH
Total Protein: 8.1

## 2013-07-27 ENCOUNTER — Emergency Department (HOSPITAL_COMMUNITY)
Admission: EM | Admit: 2013-07-27 | Discharge: 2013-07-27 | Disposition: A | Discharge status: Home / Self Care | Payer: 59 | Attending: Emergency Medicine | Admitting: Emergency Medicine

## 2013-07-27 ENCOUNTER — Emergency Department (HOSPITAL_COMMUNITY): Payer: 59

## 2013-07-27 ENCOUNTER — Encounter (HOSPITAL_COMMUNITY): Payer: Self-pay | Admitting: *Deleted

## 2013-07-27 DIAGNOSIS — M109 Gout, unspecified: Secondary | ICD-10-CM | POA: Insufficient documentation

## 2013-07-27 DIAGNOSIS — Z87448 Personal history of other diseases of urinary system: Secondary | ICD-10-CM | POA: Insufficient documentation

## 2013-07-27 DIAGNOSIS — Z79899 Other long term (current) drug therapy: Secondary | ICD-10-CM | POA: Insufficient documentation

## 2013-07-27 DIAGNOSIS — Z7982 Long term (current) use of aspirin: Secondary | ICD-10-CM | POA: Insufficient documentation

## 2013-07-27 DIAGNOSIS — R002 Palpitations: Secondary | ICD-10-CM | POA: Insufficient documentation

## 2013-07-27 DIAGNOSIS — I10 Essential (primary) hypertension: Secondary | ICD-10-CM | POA: Insufficient documentation

## 2013-07-27 HISTORY — DX: Disorder of kidney and ureter, unspecified: N28.9

## 2013-07-27 HISTORY — DX: Gout, unspecified: M10.9

## 2013-07-27 HISTORY — DX: Essential (primary) hypertension: I10

## 2013-07-27 LAB — CBC
HCT: 45.4 % (ref 39.0–52.0)
Hemoglobin: 15.8 g/dL (ref 13.0–17.0)
MCHC: 34.8 g/dL (ref 30.0–36.0)
MCV: 88.2 fL (ref 78.0–100.0)
RDW: 13.2 % (ref 11.5–15.5)

## 2013-07-27 LAB — BASIC METABOLIC PANEL
BUN: 34 mg/dL — ABNORMAL HIGH (ref 6–23)
Chloride: 106 mEq/L (ref 96–112)
Creatinine, Ser: 3.08 mg/dL — ABNORMAL HIGH (ref 0.50–1.35)
GFR calc Af Amer: 25 mL/min — ABNORMAL LOW (ref >90)
GFR calc non Af Amer: 21 mL/min — ABNORMAL LOW (ref >90)
Glucose, Bld: 111 mg/dL — ABNORMAL HIGH (ref 70–99)
Potassium: 3.8 mEq/L (ref 3.5–5.1)

## 2013-07-27 NOTE — ED Provider Notes (Signed)
CSN: [id number]     Arrival date & time 07/27/13  1635 History   First MD Initiated Contact with Patient 07/27/13 1653     Chief Complaint  Patient presents with  . Palpitations   (Consider location/radiation/quality/duration/timing/severity/associated sxs/prior Treatment) HPI This is a 89 are old male with a history of hypertension, and kidney disease who presents with palpitations. The patient reports getting home from work at approximately 1 AM this morning. At 3 AM he had onset of feeling like his heart was racing. He took his pulse and it is best we 160 beats per minute. At that time he had no chest pain or shortness of breath. He states he took B. deep breaths which seem to help. He states that he stayed in bed all day and at times noted his pulse to be as low as 45. At 3 pm he states he began to feel he did. He denies any chest pain shortness of breath or diaphoresis at that time. Since then all his symptoms have resolved. He has a history of similar symptoms for which he is followed by cardiology. He had a stress test about 5 years ago. He takes a daily 81 mg aspirin. He is completely asymptomatic at this time. Past Medical History  Diagnosis Date  . Hypertension   . Renal disorder   . Gout    History reviewed. No pertinent past surgical history. History reviewed. No pertinent family history. History  Substance Use Topics  . Smoking status: Not on file  . Smokeless tobacco: Not on file  . Alcohol Use: Not on file    Review of Systems  Constitutional: Negative.  Negative for fever.  Respiratory: Negative.  Negative for chest tightness and shortness of breath.   Cardiovascular: Positive for palpitations. Negative for chest pain.  Gastrointestinal: Negative.  Negative for abdominal pain.  Genitourinary: Negative.  Negative for dysuria.  Musculoskeletal: Negative for back pain and joint swelling.  Skin: Negative for rash.  Neurological: Negative for headaches.  All other systems  reviewed and are negative.    Allergies  Review of patient's allergies indicates no known allergies.  Home Medications   Current Outpatient Rx  Name  Route  Sig  Dispense  Refill  . amLODipine (NORVASC) 10 MG tablet   Oral   Take 10 mg by mouth daily.         Marland Kitchen aspirin 81 MG tablet   Oral   Take 81 mg by mouth daily.         Marland Kitchen atorvastatin (LIPITOR) 10 MG tablet   Oral   Take 10 mg by mouth daily.         . febuxostat (ULORIC) 40 MG tablet   Oral   Take 80 mg by mouth daily.         . fenofibrate 160 MG tablet   Oral   Take 160 mg by mouth daily.         Marland Kitchen lisinopril-hydrochlorothiazide (PRINZIDE,ZESTORETIC) 20-25 MG per tablet   Oral   Take 1 tablet by mouth daily.         . Vitamin D, Ergocalciferol, (DRISDOL) 50000 UNITS CAPS capsule   Oral   Take 50,000 Units by mouth. Take twice a week.No specific days          BP 119/77  Pulse 104  Resp 12  Wt 300 lb (136.079 kg)  SpO2 100% Physical Exam  Nursing note and vitals reviewed. Constitutional: He is oriented to person, place, and  time. He appears well-developed and well-nourished.  HENT:  Head: Normocephalic and atraumatic.  Neck: Neck supple.  Cardiovascular: Normal rate, regular rhythm and normal heart sounds.   No murmur heard. Pulmonary/Chest: Effort normal and breath sounds normal. No respiratory distress. He has no wheezes.  Abdominal: Soft. Bowel sounds are normal. There is no tenderness. There is no rebound.  Musculoskeletal: He exhibits no edema.  Lymphadenopathy:    He has no cervical adenopathy.  Neurological: He is alert and oriented to person, place, and time.  Skin: Skin is warm and dry.  Psychiatric: He has a normal mood and affect.    ED Course  Procedures (including critical care time) Labs Review Labs Reviewed  BASIC METABOLIC PANEL - Abnormal; Notable for the following:    Glucose, Bld 111 (*)    BUN 34 (*)    Creatinine, Ser 3.08 (*)    GFR calc non Af Amer 21  (*)    GFR calc Af Amer 25 (*)    All other components within normal limits  CBC  POCT I-STAT TROPONIN I   Imaging Review Dg Chest 2 View  07/27/2013   CLINICAL DATA:  Palpitations.  EXAM: CHEST  2 VIEW  COMPARISON:  04/15/2011.  FINDINGS: Right diaphragmatic eventration. Left basilar calcified granuloma. Normal sized heart and clear lungs. Unremarkable bones.  IMPRESSION: No acute abnormality.   Electronically Signed   By: Enrique Sack   On: 07/27/2013 17:55   EKG independently reviewed by myself: Sinus rhythm with a rate of 92, no evidence of ST elevation or ischemia, no arrhythmia present, no interval prolongation noted. Unchanged from prior  MDM   1. Palpitations     This a 52 year old male who presents with palpitations. He is currently asymptomatic. He is nontoxic-appearing on exam. Initial vital signs were notable for heart rate of 104. EKG shows no evidence of arrhythmia. Patient denies any chest pain or shortness of breath during these episodes. Patient has been followed by cardiologist in the past for similar episodes. Screening lab work was done including troponin which is negative. Chest x-ray is within normal limits. Patient remained asymptomatic on telemetry while in the ER. Lab work was notable for creatinine of 3.08 which is elevated from our last known creatinine. The patient states that he is followed every 3 months and that he thinks his creatinine is 2.8 at baseline. Patient will be discharged home. He was instructed to call his cardiologist tomorrow for followup. After history, exam, and medical workup I feel the patient has been appropriately medically screened and is safe for discharge home. Pertinent diagnoses were discussed with the patient. Patient was given return precautions.  Merryl Hacker, MD 07/27/13 2052

## 2013-07-27 NOTE — ED Notes (Signed)
Placed patient back on the monitor and introduced myself to him and his family that is at bedside.

## 2013-07-27 NOTE — ED Notes (Signed)
Pt in c/o palpitations that started this am around 3, pt states he rested throughout the day, he could tell his heart rate was irregular, history of the same, states the symptoms seem to have improved this evening, denies chest pain at this time or during this event, denies shortness of breath, pt alert, skin warm and dry

## 2013-07-27 NOTE — ED Notes (Signed)
Patient transported to X-ray 

## 2013-07-27 NOTE — ED Notes (Signed)
Patient presents with c/o palpitations for the past 12 hours. Started early this AM around 3am while he was in the bed sleeping and has lasted all day until around 4 pm, just prior to him leaving to come here. Patient has stayed in bed all day. Has gotten up periodically to use the restroom, etc. Denies any CP, SOB, dizziness, N/V, abd pain or headaches. Endorses some diaphoresis this afternoon. Patient has had this happen about 4 times in the past. Evaluated by cardiologist for this 5 years ago with negative stress test. Currently patient denies any palpitations, CP, feelings of heart racing, SOB. Skin warm and dry. VSS and WNL, NAD noted at this time.

## 2013-08-29 ENCOUNTER — Encounter: Payer: Self-pay | Admitting: Cardiovascular Disease

## 2013-09-20 ENCOUNTER — Encounter: Payer: Self-pay | Admitting: *Deleted

## 2013-09-20 DIAGNOSIS — N289 Disorder of kidney and ureter, unspecified: Secondary | ICD-10-CM | POA: Insufficient documentation

## 2013-09-20 DIAGNOSIS — M109 Gout, unspecified: Secondary | ICD-10-CM | POA: Insufficient documentation

## 2013-09-20 DIAGNOSIS — I10 Essential (primary) hypertension: Secondary | ICD-10-CM | POA: Insufficient documentation

## 2013-09-20 DIAGNOSIS — I4891 Unspecified atrial fibrillation: Secondary | ICD-10-CM | POA: Insufficient documentation

## 2013-09-21 ENCOUNTER — Encounter: Payer: Self-pay | Admitting: Cardiovascular Disease

## 2013-09-21 ENCOUNTER — Ambulatory Visit (INDEPENDENT_AMBULATORY_CARE_PROVIDER_SITE_OTHER): Payer: 59 | Admitting: Cardiovascular Disease

## 2013-09-21 VITALS — BP 126/86 | HR 55 | Ht 71.0 in | Wt 303.0 lb

## 2013-09-21 DIAGNOSIS — I4891 Unspecified atrial fibrillation: Secondary | ICD-10-CM

## 2013-09-21 DIAGNOSIS — I480 Paroxysmal atrial fibrillation: Secondary | ICD-10-CM

## 2013-09-21 DIAGNOSIS — N289 Disorder of kidney and ureter, unspecified: Secondary | ICD-10-CM

## 2013-09-21 DIAGNOSIS — I10 Essential (primary) hypertension: Secondary | ICD-10-CM

## 2013-09-21 NOTE — Progress Notes (Signed)
Patient ID: [name] [name], male   DOB: 09-Mar-1961, 52 y.o.   MRN: [id number]       52 yo referred by Dr Mercy Moore for irregular heart beat.  I have seen him in 2003 and 2009  His mother in law is a patient of mine [employer].  He has had a normal cath in 2003 and normal myovue in 2009.  Has CRF.  Failed LUE fistula by Dr Trula Slade.  Concerned that he may not be a renal transplant candidate because of his obesity.  Gets very infrequent palpitations. Seen in ER 8/27 with presumed 12 hour episode of PAF  By time he was evaluated in ER NSR.  Has not wanted to be on coumadin in past [place] score 2.  No chest pain syncope or dyspnea.  Started on lopressor by Dr Lorrene Reid.  Had some calf tightness and he lowered dose himself.  Asymptomatic now ROS: Denies fever, malais, weight loss, blurry vision, decreased visual acuity, cough, sputum, SOB, hemoptysis, pleuritic pain, palpitaitons, heartburn, abdominal pain, melena, lower extremity edema, claudication, or rash.  All other systems reviewed and negative   General: Affect appropriate Healthy:  appears stated age 52: normal Neck supple with no adenopathy JVP normal no bruits no thyromegaly Lungs clear with no wheezing and good diaphragmatic motion Heart:  S1/S2 no murmur,rub, gallop or click PMI normal Abdomen: benighn, BS positve, no tenderness, no AAA no bruit.  No HSM or HJR Distal pulses intact with no bruits  Failed LUE fistula with no thrill  No edema Neuro non-focal Skin warm and dry No muscular weakness  Medications Current Outpatient Prescriptions  Medication Sig Dispense Refill  . amLODipine (NORVASC) 10 MG tablet Take 10 mg by mouth daily.      Marland Kitchen aspirin 81 MG tablet Take 81 mg by mouth daily.      Marland Kitchen atorvastatin (LIPITOR) 10 MG tablet Take 10 mg by mouth daily.      . febuxostat (ULORIC) 40 MG tablet Take 80 mg by mouth daily.      . fenofibrate 160 MG tablet Take 160 mg by mouth daily.      Marland Kitchen lisinopril-hydrochlorothiazide  (PRINZIDE,ZESTORETIC) 20-25 MG per tablet Take 1 tablet by mouth daily.      . Vitamin D, Ergocalciferol, (DRISDOL) 50000 UNITS CAPS capsule Take 50,000 Units by mouth. Take twice a week.No specific days       No current facility-administered medications for this visit.    Allergies Review of patient's allergies indicates no known allergies.  Family History: Family History  Problem Relation Age of Onset  . CAD      Social History: History   Social History  . Marital Status: Married    Spouse Name: N/A    Number of Children: N/A  . Years of Education: N/A   Occupational History  . Not on file.   Social History Main Topics  . Smoking status: Never Smoker   . Smokeless tobacco: Not on file  . Alcohol Use: No  . Drug Use: No  . Sexual Activity: Not on file   Other Topics Concern  . Not on file   Social History Narrative  . No narrative on file    Electrocardiogram:  8/27  SR rate 92 normal ECG   Assessment and Plan

## 2013-09-21 NOTE — Patient Instructions (Signed)
Your physician wants you to follow-up in:   [place] will receive a reminder letter in the mail two months in advance. If you don't receive a letter, please call our office to schedule the follow-up appointment. Your physician recommends that you continue on your current medications as directed. Please refer to the Current Medication list given to you today. Your physician has requested that you have an echocardiogram. Echocardiography is a painless test that uses sound waves to create images of your heart. It provides your doctor with information about the size and shape of your heart and how well your heart's chambers and valves are working. This procedure takes approximately one hour. There are no restrictions for this procedure.   DR  Kaylyn Lim  [phone]

## 2013-09-21 NOTE — Assessment & Plan Note (Signed)
Well controlled.  Continue current medications and low sodium Dash type diet.    

## 2013-09-21 NOTE — Assessment & Plan Note (Signed)
He really wants to pursue transplant evaluation. Discussed lapband evaluation with Dr Hassell Done so he could loose enough weight to be a candidate for transplant He will f/u with this.  Some of the diets he has looked into are not compatable with his CRF due to excess fluid or protein.  F/U Dr Lorrene Reid  No rub or uremic symptoms

## 2013-09-21 NOTE — Assessment & Plan Note (Signed)
Infrequent and stable with Mali 2  Continue asa and beta blocker Echo to assess chamber sizes

## 2013-09-30 ENCOUNTER — Ambulatory Visit (INDEPENDENT_AMBULATORY_CARE_PROVIDER_SITE_OTHER): Payer: Self-pay | Admitting: Surgery

## 2013-10-06 ENCOUNTER — Other Ambulatory Visit: Payer: Self-pay

## 2013-10-06 ENCOUNTER — Ambulatory Visit (HOSPITAL_COMMUNITY): Payer: 59 | Attending: Cardiovascular Disease

## 2013-10-06 DIAGNOSIS — I4891 Unspecified atrial fibrillation: Secondary | ICD-10-CM

## 2013-10-06 DIAGNOSIS — I480 Paroxysmal atrial fibrillation: Secondary | ICD-10-CM

## 2013-10-11 ENCOUNTER — Telehealth: Payer: Self-pay | Admitting: Cardiovascular Disease

## 2013-10-11 NOTE — Telephone Encounter (Signed)
New message     Returned Christine's call.  Pls call back at your convenience.dp

## 2013-10-11 NOTE — Telephone Encounter (Signed)
PT'S WIFE  AWARE OF ECHO RESULTS./CY

## 2013-10-14 ENCOUNTER — Ambulatory Visit (INDEPENDENT_AMBULATORY_CARE_PROVIDER_SITE_OTHER): Payer: Self-pay | Admitting: Surgery

## 2014-01-04 ENCOUNTER — Encounter (INDEPENDENT_AMBULATORY_CARE_PROVIDER_SITE_OTHER): Payer: Self-pay | Admitting: Surgery

## 2014-01-04 ENCOUNTER — Ambulatory Visit (INDEPENDENT_AMBULATORY_CARE_PROVIDER_SITE_OTHER): Payer: 59 | Admitting: Surgery

## 2014-01-04 HISTORY — DX: Morbid (severe) obesity due to excess calories: E66.01

## 2014-01-04 NOTE — Patient Instructions (Signed)
Avoid simple carbohydrates

## 2014-01-04 NOTE — Progress Notes (Signed)
Chief Complaint:  Obesity and chronic renal failure.  Needs to lose weight to be candidate for renal transplant  History of Present Illness:  [name] [name] is an 53 y.o. male who comes in with his wife to discuss possible lap band surgery. He is 5 feet 11 and weighs 306 pounds giving him a BMI of 43. He is however a very large framed man and muscular so in his case the BMI probably over states his degree of obesity.  I spent about 40 minutes in toto talking with them about weight loss surgery and about changes in behavior and diet to make the successful. I think that before recommending a lap band I would want to try work with him to see if I could help him lose his much as possible with a diet. I went over a diet that is low in simple carbohydrates and with fluids and proteins in a fashion that should make this weight loss achievable. He has underlying idiopathic renal failure although it may be from untreated hypertension.  They seemed very motivated to try to get his weight down so he can be a transplant candidate as he would like to avoid going on hemodialysis. I also considered chemical treatment with medications such as phentermine but these have implications for folks with hypertension and with chronic renal disease.  Past Medical History  Diagnosis Date  . Hypertension   . Renal insufficiency   . Gout   . PAF (paroxysmal atrial fibrillation)     Past Surgical History  Procedure Laterality Date  . Knee surgery      Current Outpatient Prescriptions  Medication Sig Dispense Refill  . amLODipine (NORVASC) 10 MG tablet Take 10 mg by mouth daily.      Marland Kitchen aspirin 81 MG tablet Take 81 mg by mouth daily.      Marland Kitchen atorvastatin (LIPITOR) 10 MG tablet Take 10 mg by mouth daily.      . febuxostat (ULORIC) 40 MG tablet Take 80 mg by mouth daily.      . fenofibrate 160 MG tablet Take 160 mg by mouth daily.      Marland Kitchen lisinopril-hydrochlorothiazide (PRINZIDE,ZESTORETIC) 20-25 MG per tablet Take 1  tablet by mouth daily.      . metoprolol succinate (TOPROL-XL) 50 MG 24 hr tablet Take 50 mg by mouth daily. Take with or immediately following a meal.      . Vitamin D, Ergocalciferol, (DRISDOL) 50000 UNITS CAPS capsule Take 50,000 Units by mouth every 7 (seven) days. Take twice a week.No specific days       No current facility-administered medications for this visit.   Review of patient's allergies indicates no known allergies. Family History  Problem Relation Age of Onset  . CAD    . Hypertension Mother   . Cancer Father     bone  . Cancer Sister     breast  . Cancer Sister     breast   Social History:   reports that he has never smoked. He has never used smokeless tobacco. He reports that he does not drink alcohol or use illicit drugs.   REVIEW OF SYSTEMS - PERTINENT POSITIVES ONLY: Noncontributory.  Physical Exam:   Blood pressure 140/98, pulse 72, temperature 97.4 F (36.3 C), temperature source Temporal, resp. rate 15, height 5\' 11"  (1.803 m), weight 306 lb 6.4 oz (138.982 kg). Body mass index is 42.75 kg/(m^2).  Gen:  WDWN white male NAD  Neurological: Alert and oriented to person, place,  and time. Motor and sensory function is grossly intact  Head: Normocephalic and atraumatic.  Eyes: Conjunctivae are normal. Pupils are equal, round, and reactive to light. No scleral icterus.  Neck: Normal range of motion. Neck supple. No tracheal deviation or thyromegaly present.  Abdomen:  Prominent umbilical hernia and some centripetal obesity. Musculoskeletal: Normal range of motion. Extremities are nontender. No cyanosis, edema or clubbing noted Lymphadenopathy: No cervical, preauricular, postauricular or axillary adenopathy is present Skin: Skin is warm and dry. No rash noted. No diaphoresis. No erythema. No pallor. Pscyh: Normal mood and affect. Behavior is normal. Judgment and thought content normal.   LABORATORY RESULTS: No results found for this or any previous visit (from  the past 48 hour(s)).  RADIOLOGY RESULTS: No results found.  Problem List: Patient Active Problem List   Diagnosis Date Noted  . Morbid obesity BMI 42 01/04/2014  . Hypertension   . Renal disorder   . Gout   . PAF (paroxysmal atrial fibrillation)     Assessment & Plan: Obesity and chronic renal failure. Plan dietary intervention. I will work with him on diet alone   and we'll see him back in 6 weeks and see what her progress he is making.    Matt B. Hassell Done, MD, [facility] [phone] beeper [phone]  01/04/2014 11:48 AM

## 2014-02-17 ENCOUNTER — Encounter (INDEPENDENT_AMBULATORY_CARE_PROVIDER_SITE_OTHER): Payer: Self-pay | Admitting: Surgery

## 2014-02-17 ENCOUNTER — Ambulatory Visit (INDEPENDENT_AMBULATORY_CARE_PROVIDER_SITE_OTHER): Payer: 59 | Admitting: Surgery

## 2014-02-17 DIAGNOSIS — I480 Paroxysmal atrial fibrillation: Secondary | ICD-10-CM

## 2014-02-17 DIAGNOSIS — I4891 Unspecified atrial fibrillation: Secondary | ICD-10-CM

## 2014-02-17 DIAGNOSIS — I10 Essential (primary) hypertension: Secondary | ICD-10-CM

## 2014-02-17 DIAGNOSIS — M109 Gout, unspecified: Secondary | ICD-10-CM

## 2014-02-17 NOTE — Progress Notes (Signed)
Chief Complaint:  Morbid obesity and occult renal failure-impending need for kidney transplant  History of Present Illness:  [name] [name] is an 53 y.o. male 2  saw him last has lost 12 pounds. At that time we talked about a 10 carbon dioxide and I suggested ways to avoid salt. Since then his creatinine apparently has gone up a little and his blood pressure has gone down and he is feeling much better. They have decided they would like to move forward with laparoscopic adjustable gastric band and I will agree to do that. 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   Past Medical History  Diagnosis Date  . Hypertension   . Renal insufficiency   . Gout   . PAF (paroxysmal atrial fibrillation)     Past Surgical History  Procedure Laterality Date  . Knee surgery      Current Outpatient Prescriptions  Medication Sig Dispense Refill  . amLODipine (NORVASC) 10 MG tablet Take 10 mg by mouth daily.      Marland Kitchen aspirin 81 MG tablet Take 81 mg by mouth daily.      Marland Kitchen atorvastatin (LIPITOR) 10 MG tablet Take 10 mg by mouth daily.      . febuxostat (ULORIC) 40 MG tablet Take 80 mg by mouth daily.      . fenofibrate 160 MG tablet Take 160 mg by mouth daily.      Marland Kitchen lisinopril-hydrochlorothiazide (PRINZIDE,ZESTORETIC) 20-25 MG per tablet Take 1 tablet by mouth daily.      . metoprolol succinate (TOPROL-XL) 50 MG 24 hr tablet Take 50 mg by mouth daily. Take with or immediately following a meal.      . Vitamin D, Ergocalciferol, (DRISDOL) 50000 UNITS CAPS capsule Take 50,000 Units by mouth every 7 (seven) days. Take twice a week.No specific days       No current facility-administered medications for this visit.   Review of patient's allergies indicates no known allergies. Family History  Problem Relation Age of Onset  . CAD    . Hypertension Mother   . Cancer Father     bone  . Cancer Sister     breast  . Cancer Sister     breast   Social History:   reports that he has never smoked. He  has never used smokeless tobacco. He reports that he does not drink alcohol or use illicit drugs.   REVIEW OF SYSTEMS - PERTINENT POSITIVES ONLY: Positive for occult hypertension probably leading to his renal failure.  Physical Exam:   Pulse 82, temperature 98.4 F (36.9 C), temperature source Temporal, resp. rate 14, height 5\' 11"  (1.803 m), weight 295 lb 6.4 oz (133.993 kg). Body mass index is 41.22 kg/(m^2).  Gen:  WDWN white male NAD  Neurological: Alert and oriented to person, place, and time. Motor and sensory function is grossly intact  Head: Normocephalic and atraumatic.  Eyes: Conjunctivae are normal. Pupils are equal, round, and reactive to light. No scleral icterus.  Neck: Normal range of motion. Neck supple. No tracheal deviation or thyromegaly present.  Cardiovascular:  SR without murmurs or gallops.  No carotid bruits Respiratory: Effort normal.  No respiratory distress. No chest wall tenderness. Breath sounds normal.  No wheezes, rales or rhonchi.  Abdomen:  Umbilical hernia the would need to be repaired at the time of his lap band. GU: Musculoskeletal: Normal range of motion. Extremities are nontender. No cyanosis, edema or clubbing noted Lymphadenopathy: No cervical, preauricular, postauricular or axillary adenopathy is  present Skin: Skin is warm and dry. No rash noted. No diaphoresis. No erythema. No pallor. Pscyh: Normal mood and affect. Behavior is normal. Judgment and thought content normal.   LABORATORY RESULTS: No results found for this or any previous visit (from the past 48 hour(s)).  RADIOLOGY RESULTS: No results found.  Problem List: Patient Active Problem List   Diagnosis Date Noted  . Morbid obesity BMI 42 01/04/2014  . Hypertension   . Renal disorder   . Gout   . PAF (paroxysmal atrial fibrillation)     Assessment & Plan: Renal failure, obesity and need to lose weight to qualify for a renal transplant. Umbilical hernia We'll work toward approval  for laparoscopic adjustable gastric banding.    Matt B. Hassell Done, MD, [facility] [phone] beeper [phone]  02/17/2014 5:28 PM

## 2014-02-17 NOTE — Addendum Note (Signed)
Addended by: Ivor Costa on: 02/17/2014 05:39 PM   Modules accepted: Orders

## 2014-02-17 NOTE — Patient Instructions (Signed)

## 2014-03-13 ENCOUNTER — Ambulatory Visit (HOSPITAL_COMMUNITY): Payer: 59

## 2014-03-13 ENCOUNTER — Other Ambulatory Visit: Payer: Self-pay

## 2014-03-13 ENCOUNTER — Ambulatory Visit (HOSPITAL_COMMUNITY)
Admission: RE | Admit: 2014-03-13 | Discharge: 2014-03-13 | Disposition: A | Discharge status: Home / Self Care | Payer: 59 | Source: Ambulatory Visit | Attending: Surgery | Admitting: Surgery

## 2014-03-13 ENCOUNTER — Other Ambulatory Visit (HOSPITAL_COMMUNITY): Payer: 59

## 2014-03-13 DIAGNOSIS — I4891 Unspecified atrial fibrillation: Secondary | ICD-10-CM | POA: Insufficient documentation

## 2014-03-13 DIAGNOSIS — I10 Essential (primary) hypertension: Secondary | ICD-10-CM

## 2014-03-13 DIAGNOSIS — I480 Paroxysmal atrial fibrillation: Secondary | ICD-10-CM

## 2014-03-13 DIAGNOSIS — M109 Gout, unspecified: Secondary | ICD-10-CM

## 2014-03-13 DIAGNOSIS — K7689 Other specified diseases of liver: Secondary | ICD-10-CM | POA: Insufficient documentation

## 2014-03-13 DIAGNOSIS — K8020 Calculus of gallbladder without cholecystitis without obstruction: Secondary | ICD-10-CM | POA: Insufficient documentation

## 2014-03-13 DIAGNOSIS — Q619 Cystic kidney disease, unspecified: Secondary | ICD-10-CM | POA: Insufficient documentation

## 2014-03-13 DIAGNOSIS — J8410 Pulmonary fibrosis, unspecified: Secondary | ICD-10-CM | POA: Insufficient documentation

## 2014-03-13 DIAGNOSIS — N289 Disorder of kidney and ureter, unspecified: Secondary | ICD-10-CM | POA: Insufficient documentation

## 2014-03-13 DIAGNOSIS — Z6841 Body Mass Index (BMI) 40.0 and over, adult: Secondary | ICD-10-CM | POA: Insufficient documentation

## 2014-03-17 ENCOUNTER — Other Ambulatory Visit (HOSPITAL_COMMUNITY): Payer: 59

## 2014-03-17 ENCOUNTER — Ambulatory Visit (HOSPITAL_COMMUNITY): Payer: 59

## 2014-03-20 ENCOUNTER — Telehealth (INDEPENDENT_AMBULATORY_CARE_PROVIDER_SITE_OTHER): Payer: Self-pay

## 2014-03-20 ENCOUNTER — Other Ambulatory Visit (INDEPENDENT_AMBULATORY_CARE_PROVIDER_SITE_OTHER): Payer: Self-pay

## 2014-03-20 DIAGNOSIS — R935 Abnormal findings on diagnostic imaging of other abdominal regions, including retroperitoneum: Secondary | ICD-10-CM

## 2014-03-20 NOTE — Telephone Encounter (Signed)
Called and left message for patient to call our office, need to speak with him regarding his [place] results.

## 2014-03-23 ENCOUNTER — Other Ambulatory Visit (HOSPITAL_COMMUNITY): Payer: Self-pay | Admitting: Urology

## 2014-03-23 DIAGNOSIS — N281 Cyst of kidney, acquired: Secondary | ICD-10-CM

## 2014-03-30 ENCOUNTER — Encounter: Payer: Self-pay | Admitting: Dietician

## 2014-03-30 ENCOUNTER — Encounter: Payer: 59 | Attending: Surgery | Admitting: Dietician

## 2014-03-30 DIAGNOSIS — Z713 Dietary counseling and surveillance: Secondary | ICD-10-CM | POA: Insufficient documentation

## 2014-03-30 DIAGNOSIS — Z01818 Encounter for other preprocedural examination: Secondary | ICD-10-CM | POA: Insufficient documentation

## 2014-03-30 NOTE — Progress Notes (Signed)
  Pre-Op Assessment Visit:  Pre-Operative LAGB Surgery  Medical Nutrition Therapy:  Appt start time: 1400   End time:  [id number]  Patient was seen on 03/30/2014 for Pre-Operative LAGB Nutrition Assessment. Assessment and letter of approval faxed to [facility] Bariatric Surgery Program coordinator on 03/30/2014.   Preferred Learning Style:   No preference indicated   Learning Readiness:   Ready  Handouts given during visit include:  Pre-Op Goals Bariatric Surgery Protein Shakes  Teaching Method Utilized: Visual Auditory Hands on  Barriers to learning/adherence to lifestyle change: none  Demonstrated degree of understanding via:  Teach Back   [facility] to [facility] the [facility] to enroll in Pre-Op and Post-Op Nutrition Education when surgery date is scheduled.

## 2014-04-03 ENCOUNTER — Ambulatory Visit (HOSPITAL_COMMUNITY)
Admission: RE | Admit: 2014-04-03 | Discharge: 2014-04-03 | Disposition: A | Discharge status: Home / Self Care | Payer: 59 | Source: Ambulatory Visit | Attending: Urology | Admitting: Urology

## 2014-04-03 ENCOUNTER — Other Ambulatory Visit (HOSPITAL_COMMUNITY): Payer: Self-pay | Admitting: Urology

## 2014-04-03 DIAGNOSIS — N1330 Unspecified hydronephrosis: Secondary | ICD-10-CM | POA: Insufficient documentation

## 2014-04-03 DIAGNOSIS — N281 Cyst of kidney, acquired: Secondary | ICD-10-CM

## 2014-04-03 DIAGNOSIS — K8020 Calculus of gallbladder without cholecystitis without obstruction: Secondary | ICD-10-CM | POA: Insufficient documentation

## 2014-04-03 DIAGNOSIS — N289 Disorder of kidney and ureter, unspecified: Secondary | ICD-10-CM | POA: Insufficient documentation

## 2014-04-03 LAB — CBC WITH DIFFERENTIAL/PLATELET
BASOS ABS: 0.1 10*3/uL (ref 0.0–0.1)
BASOS PCT: 1 % (ref 0–1)
Eosinophils Absolute: 0.2 10*3/uL (ref 0.0–0.7)
Eosinophils Relative: 3 % (ref 0–5)
HCT: 40.6 % (ref 39.0–52.0)
HEMOGLOBIN: 13.7 g/dL (ref 13.0–17.0)
Lymphocytes Relative: 24 % (ref 12–46)
Lymphs Abs: 1.7 10*3/uL (ref 0.7–4.0)
MCH: 29.3 pg (ref 26.0–34.0)
MCHC: 33.7 g/dL (ref 30.0–36.0)
MCV: 86.9 fL (ref 78.0–100.0)
MONOS PCT: 5 % (ref 3–12)
Monocytes Absolute: 0.3 10*3/uL (ref 0.1–1.0)
NEUTROS ABS: 4.6 10*3/uL (ref 1.7–7.7)
NEUTROS PCT: 67 % (ref 43–77)
Platelets: 231 10*3/uL (ref 150–400)
RBC: 4.67 MIL/uL (ref 4.22–5.81)
RDW: 14.1 % (ref 11.5–15.5)
WBC: 6.9 10*3/uL (ref 4.0–10.5)

## 2014-04-03 LAB — COMPREHENSIVE METABOLIC PANEL
ALBUMIN: 4.2 g/dL (ref 3.5–5.2)
ALK PHOS: 30 U/L — AB (ref 39–117)
ALT: 30 U/L (ref 0–53)
AST: 31 U/L (ref 0–37)
BUN: 46 mg/dL — ABNORMAL HIGH (ref 6–23)
CO2: 26 mEq/L (ref 19–32)
Calcium: 10 mg/dL (ref 8.4–10.5)
Chloride: 105 mEq/L (ref 96–112)
Creat: 3.46 mg/dL — ABNORMAL HIGH (ref 0.50–1.35)
Glucose, Bld: 145 mg/dL — ABNORMAL HIGH (ref 70–99)
POTASSIUM: 4.1 meq/L (ref 3.5–5.3)
SODIUM: 139 meq/L (ref 135–145)
TOTAL PROTEIN: 7 g/dL (ref 6.0–8.3)
Total Bilirubin: 0.6 mg/dL (ref 0.2–1.2)

## 2014-04-04 LAB — T4: T4 TOTAL: 8 ug/dL (ref 5.0–12.5)

## 2014-04-04 LAB — H. PYLORI ANTIBODY, IGG: H Pylori IgG: >8 {ISR} — ABNORMAL HIGH

## 2014-04-04 LAB — TSH: TSH: 1.877 u[IU]/mL (ref 0.350–4.500)

## 2014-04-07 ENCOUNTER — Encounter (HOSPITAL_COMMUNITY)
Admission: RE | Admit: 2014-04-07 | Discharge: 2014-04-07 | Disposition: A | Discharge status: Home / Self Care | Payer: 59 | Source: Ambulatory Visit | Attending: Urology | Admitting: Urology

## 2014-04-07 DIAGNOSIS — N1330 Unspecified hydronephrosis: Secondary | ICD-10-CM

## 2014-04-07 MED ORDER — FUROSEMIDE 10 MG/ML IJ SOLN
60.0000 mg | Freq: Once | INTRAMUSCULAR | Status: AC
  Administered 2014-04-07: 60 mg via INTRAVENOUS
  Filled 2014-04-07: qty 6

## 2014-04-07 MED ORDER — TECHNETIUM TC 99M MERTIATIDE
16.4000 | Freq: Once | INTRAVENOUS | Status: AC | PRN
  Administered 2014-04-07: 16 via INTRAVENOUS

## 2014-05-22 ENCOUNTER — Encounter: Payer: 59 | Attending: Surgery

## 2014-05-22 DIAGNOSIS — Z01818 Encounter for other preprocedural examination: Secondary | ICD-10-CM | POA: Insufficient documentation

## 2014-05-22 DIAGNOSIS — Z713 Dietary counseling and surveillance: Secondary | ICD-10-CM | POA: Insufficient documentation

## 2014-05-23 NOTE — Progress Notes (Signed)
  Pre-Operative Nutrition Class:  Appt start time: 3343   End time:  1830.  Patient was seen on 05/22/2014 for Pre-Operative Bariatric Surgery Education at the [facility].   Surgery date: 06/20/2014 Surgery type: LAGB Start weight at [facility]: 299 lbs on 03/30/2014 Weight today: 297.5 lbs  TANITA  BODY COMP RESULTS  05/22/2014   BMI (kg/m^2) 42.7   Fat Mass (lbs) 129   Fat Free Mass (lbs) 168.5   Total Body Water (lbs) 123.5   Samples given per MNT protocol. Patient educated on appropriate usage:   Unjury Protein powder (vanilla - qty 1)  Lot#: [id number]  Exp: 09/2015   Bariatric Advantage Calcium citrate (cinnamon - qty 1)  Lot#: [id number]  Exp: 07/2014   Bariactiv Multivitamin (qty 1)  Lot#: [id number]  Exp: 04/2015   Premier protein shake (vanilla - qty 1)  Lot #: [id number]  Exp: 08/2014    The following the learning objectives were met by the patient during this course:  Identify Pre-Op Dietary Goals and will begin 2 weeks pre-operatively  Identify appropriate sources of fluids and proteins   State protein recommendations and appropriate sources pre and post-operatively  Identify Post-Operative Dietary Goals and will follow for 2 weeks post-operatively  Identify appropriate multivitamin and calcium sources  Describe the need for physical activity post-operatively and will follow MD recommendations  State when to call healthcare provider regarding medication questions or post-operative complications  Handouts given during class include:  Pre-Op Bariatric Surgery Diet Handout  Protein Shake Handout  Post-Op Bariatric Surgery Nutrition Handout  BELT Program Information Flyer  Support Group Information Flyer  [facility] Bariatric Supplements Price List  Follow-Up Plan: Patient will follow-up at [facility] 2 weeks post operatively for diet advancement per MD.

## 2014-06-12 ENCOUNTER — Other Ambulatory Visit (HOSPITAL_COMMUNITY): Payer: Self-pay | Admitting: *Deleted

## 2014-06-12 NOTE — Patient Instructions (Addendum)
[name] [name]  06/12/2014                           YOUR PROCEDURE IS SCHEDULED ON: 7/221/15 AT 7:15AM               ENTER THRU [facility] MAIN HOSPITAL ENTRANCE AND                            FOLLOW  SIGNS TO SHORT STAY CENTER                 ARRIVE AT SHORT STAY AT: 5:30 AM               CALL THIS NUMBER IF ANY PROBLEMS THE DAY OF SURGERY :               [phone]                                REMEMBER:   Do not eat food or drink liquids AFTER MIDNIGHT                  Take these medicines the morning of surgery with               A SIPS OF WATER :  AMLODIPINE / LIPITOR / ULORIC / FENFIBRATE / METOPROLOL       Do not wear jewelry, make-up   Do not wear lotions, powders, or perfumes.   Do not shave legs or underarms 12 hrs. before surgery (men may shave face)  Do not bring valuables to the hospital.  Contacts, dentures or bridgework may not be worn into surgery.  Leave suitcase in the car. After surgery it may be brought to your room.  For patients admitted to the hospital more than one night, checkout time is            11:00 AM                                                       The day of discharge.   Patients discharged the day of surgery will not be allowed to drive home.            If going home same day of surgery, must have someone stay with you              FIRST 24 hrs at home and arrange for some one to drive you              home from hospital.   ________________________________________________________________________                                                                        [place]  Before surgery, you can play an important role.  Because skin is not sterile, your skin needs to be as free of germs as possible.  You can  reduce the number of germs on your skin by washing with CHG (chlorahexidine gluconate) soap before surgery.  CHG is an antiseptic cleaner which kills germs and bonds with the skin  to continue killing germs even after washing. Please DO NOT use if you have an allergy to CHG or antibacterial soaps.  If your skin becomes reddened/irritated stop using the CHG and inform your nurse when you arrive at Short Stay. Do not shave (including legs and underarms) for at least 48 hours prior to the first CHG shower.  You may shave your face. Please follow these instructions carefully:   1.  Shower with CHG Soap the night before surgery and the  morning of Surgery.   2.  If you choose to wash your hair, wash your hair first as usual with your  normal  Shampoo.   3.  After you shampoo, rinse your hair and body thoroughly to remove the  shampoo.                                         4.  Use CHG as you would any other liquid soap.  You can apply chg directly  to the skin and wash . Gently wash with scrungie or clean wascloth    5.  Apply the CHG Soap to your body ONLY FROM THE NECK DOWN.   Do not use on open                           Wound or open sores. Avoid contact with eyes, ears mouth and genitals (private parts).                        Genitals (private parts) with your normal soap.              6.  Wash thoroughly, paying special attention to the area where your surgery  will be performed.   7.  Thoroughly rinse your body with warm water from the neck down.   8.  DO NOT shower/wash with your normal soap after using and rinsing off  the CHG Soap .                9.  Pat yourself dry with a clean towel.             10.  Wear clean pajamas.             11.  Place clean sheets on your bed the night of your first shower and do not  sleep with pets.  Day of Surgery : Do not apply any lotions/deodorants the morning of surgery.  Please wear clean clothes to the hospital/surgery center.  FAILURE TO FOLLOW THESE INSTRUCTIONS MAY RESULT IN THE CANCELLATION OF YOUR SURGERY    PATIENT  SIGNATURE_________________________________  ______________________________________________________________________   PT'S WIFE NOTIFIED OF PT'S SURGERY TIME CHANGE TO 1:45 PM TOMORROW AND PT SHOULD ARRIVE TO SHORT STAY BY 11:45 AM - NO FOOD AFTER MIDNIGHT TONIGHT - BUT WATER OK TO DRINK UNTIL 7:45 AM DAY OF SURGERY - NOTHING AFTER 7:45 AM.  PAT [name] Ruggerio RN

## 2014-06-12 NOTE — Progress Notes (Signed)
Dr. Hassell Done - Please enter preop orders in Epic for [name] [name]  - he is coming to [facility] tomorrow  - Tuesday 7/14 for his preop / labs.  Thanks.

## 2014-06-13 ENCOUNTER — Encounter (HOSPITAL_COMMUNITY)
Admission: RE | Admit: 2014-06-13 | Discharge: 2014-06-13 | Disposition: A | Discharge status: Home / Self Care | Payer: 59 | Source: Ambulatory Visit | Attending: Surgery | Admitting: Surgery

## 2014-06-13 ENCOUNTER — Ambulatory Visit (INDEPENDENT_AMBULATORY_CARE_PROVIDER_SITE_OTHER): Payer: 59 | Admitting: Surgery

## 2014-06-13 ENCOUNTER — Encounter (INDEPENDENT_AMBULATORY_CARE_PROVIDER_SITE_OTHER): Payer: Self-pay

## 2014-06-13 ENCOUNTER — Encounter (INDEPENDENT_AMBULATORY_CARE_PROVIDER_SITE_OTHER): Payer: Self-pay | Admitting: Surgery

## 2014-06-13 ENCOUNTER — Encounter (HOSPITAL_COMMUNITY): Payer: Self-pay

## 2014-06-13 ENCOUNTER — Encounter (HOSPITAL_COMMUNITY): Payer: Self-pay | Admitting: Pharmacy Technician

## 2014-06-13 DIAGNOSIS — Z01812 Encounter for preprocedural laboratory examination: Secondary | ICD-10-CM | POA: Insufficient documentation

## 2014-06-13 DIAGNOSIS — Z01818 Encounter for other preprocedural examination: Secondary | ICD-10-CM | POA: Insufficient documentation

## 2014-06-13 HISTORY — DX: Hyperlipidemia, unspecified: E78.5

## 2014-06-13 HISTORY — DX: Cardiac arrhythmia, unspecified: I49.9

## 2014-06-13 HISTORY — DX: Unspecified osteoarthritis, unspecified site: M19.90

## 2014-06-13 LAB — CBC
HCT: 41.9 % (ref 39.0–52.0)
HEMOGLOBIN: 14.1 g/dL (ref 13.0–17.0)
MCH: 29.6 pg (ref 26.0–34.0)
MCHC: 33.7 g/dL (ref 30.0–36.0)
MCV: 87.8 fL (ref 78.0–100.0)
Platelets: 236 10*3/uL (ref 150–400)
RBC: 4.77 MIL/uL (ref 4.22–5.81)
RDW: 12.9 % (ref 11.5–15.5)
WBC: 6.3 10*3/uL (ref 4.0–10.5)

## 2014-06-13 LAB — COMPREHENSIVE METABOLIC PANEL
ALT: 41 U/L (ref 0–53)
AST: 40 U/L — ABNORMAL HIGH (ref 0–37)
Albumin: 4.1 g/dL (ref 3.5–5.2)
Alkaline Phosphatase: 37 U/L — ABNORMAL LOW (ref 39–117)
Anion gap: 15 (ref 5–15)
BUN: 45 mg/dL — ABNORMAL HIGH (ref 6–23)
CALCIUM: 10.3 mg/dL (ref 8.4–10.5)
CO2: 27 meq/L (ref 19–32)
CREATININE: 3.46 mg/dL — AB (ref 0.50–1.35)
Chloride: 98 mEq/L (ref 96–112)
GFR calc Af Amer: 21 mL/min — ABNORMAL LOW (ref >90)
GFR, EST NON AFRICAN AMERICAN: 18 mL/min — AB (ref >90)
GLUCOSE: 84 mg/dL (ref 70–99)
Potassium: 4 mEq/L (ref 3.7–5.3)
Sodium: 140 mEq/L (ref 137–147)
Total Bilirubin: 0.8 mg/dL (ref 0.3–1.2)
Total Protein: 7.8 g/dL (ref 6.0–8.3)

## 2014-06-13 MED ORDER — HYDROCODONE-ACETAMINOPHEN 7.5-325 MG/15ML PO SOLN: 15.0000 mL | ORAL | Status: DC | PRN

## 2014-06-13 NOTE — Progress Notes (Signed)
06/13/14 1352  OBSTRUCTIVE SLEEP APNEA  Have you ever been diagnosed with sleep apnea through a sleep study? No  Do you snore loudly (loud enough to be heard through closed doors)?  0  Do you often feel tired, fatigued, or sleepy during the daytime? 0  Has anyone observed you stop breathing during your sleep? 0  Do you have, or are you being treated for high blood pressure? 1  BMI more than 35 kg/m2? 1  Age over 56 years old? 1  Neck circumference greater than 40 cm/16 inches? 1  Gender: 1  Obstructive Sleep Apnea Score 5  Score 4 or greater  Results sent to PCP

## 2014-06-13 NOTE — Progress Notes (Signed)
Chief Complaint:  Morbid obesity and occult renal failure-impending need for kidney transplant  History of Present Illness:  [name] [name] is an 53 y.o. male 2  saw him last has lost 12 pounds. At that time we talked about a 10 carbon dioxide and I suggested ways to avoid salt. Since then his creatinine apparently has gone up a little and his blood pressure has gone down and he is feeling much better. They have decided they would like to move forward with laparoscopic adjustable gastric band and I will agree to do that. 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   Past Medical History  Diagnosis Date  . Gout   . PAF (paroxysmal atrial fibrillation)   . Hypertension   . Dysrhythmia     EPISODES OF A FIB "EVERY FEW YRS" WAS GIVEN METOPROLOL TO HELP CONTROL - NO PROBLEM FOR PAST YEAR - FOLLOWED BY DR.L NISHAN  . Hyperlipidemia   . Arthritis   . Renal insufficiency     DIDNEY DISEASE - FOLLOWED BY DR. Lorrene Reid NOTES ON CHART    Past Surgical History  Procedure Laterality Date  . Knee surgery  2001  . Av fistula placement      "IT CLOSED UP AND DOES NOT WORK"  . Cataracts removed      Current Outpatient Prescriptions  Medication Sig Dispense Refill  . amLODipine (NORVASC) 10 MG tablet Take 10 mg by mouth every morning.       Marland Kitchen aspirin EC 81 MG tablet Take 81 mg by mouth daily.      Marland Kitchen atorvastatin (LIPITOR) 10 MG tablet Take 10 mg by mouth every morning.       . Febuxostat (ULORIC) 80 MG TABS Take 80 mg by mouth daily.      . fenofibrate 160 MG tablet Take 160 mg by mouth daily.      . furosemide (LASIX) 40 MG tablet Take 40 mg by mouth daily.      Marland Kitchen HYDROcodone-acetaminophen (HYCET) 7.5-325 mg/15 ml solution Take 15 mLs by mouth every 4 (four) hours as needed for moderate pain.  300 mL  0  . metoprolol (LOPRESSOR) 50 MG tablet Take 50 mg by mouth every morning.      . Vitamin D, Ergocalciferol, (DRISDOL) 50000 UNITS CAPS capsule Take 50,000 Units by mouth every 7 (seven) days.  Wednesday       No current facility-administered medications for this visit.   Review of patient's allergies indicates no known allergies. Family History  Problem Relation Age of Onset  . CAD    . Hypertension Mother   . Cancer Father     bone  . Cancer Sister     breast  . Cancer Sister     breast   Social History:   reports that he has never smoked. He has never used smokeless tobacco. He reports that he does not drink alcohol or use illicit drugs.   REVIEW OF SYSTEMS - PERTINENT POSITIVES ONLY: Positive for occult hypertension probably leading to his renal failure.  Physical Exam:   Blood pressure 138/86, pulse 66, temperature 97.1 F (36.2 C), temperature source Temporal, resp. rate 16, height _0  (1.778 m), weight 293 lb 6.4 oz (133.085 kg). Body mass index is 42.1 kg/(m^2).  Gen:  WDWN white male NAD  Neurological: Alert and oriented to person, place, and time. Motor and sensory function is grossly intact  Head: Normocephalic and atraumatic.  Eyes: Conjunctivae are normal. Pupils are equal, round,  and reactive to light. No scleral icterus.  Neck: Normal range of motion. Neck supple. No tracheal deviation or thyromegaly present.  Cardiovascular:  SR without murmurs or gallops.  No carotid bruits Respiratory: Effort normal.  No respiratory distress. No chest wall tenderness. Breath sounds normal.  No wheezes, rales or rhonchi.  Abdomen:  Umbilical hernia the would need to be repaired at the time of his lap band. GU: Musculoskeletal: Normal range of motion. Extremities are nontender. No cyanosis, edema or clubbing noted Lymphadenopathy: No cervical, preauricular, postauricular or axillary adenopathy is present Skin: Skin is warm and dry. No rash noted. No diaphoresis. No erythema. No pallor. Pscyh: Normal mood and affect. Behavior is normal. Judgment and thought content normal.   LABORATORY RESULTS: Results for orders placed during the hospital encounter of 06/13/14  (from the past 48 hour(s))  CBC     Status: None   Collection Time    06/13/14  2:10 PM      Result Value Ref Range   WBC 6.3  4.0 - 10.5 K/uL   RBC 4.77  4.22 - 5.81 MIL/uL   Hemoglobin 14.1  13.0 - 17.0 g/dL   HCT 41.9  39.0 - 52.0 %   MCV 87.8  78.0 - 100.0 fL   MCH 29.6  26.0 - 34.0 pg   MCHC 33.7  30.0 - 36.0 g/dL   RDW 12.9  11.5 - 15.5 %   Platelets 236  150 - 400 K/uL  COMPREHENSIVE METABOLIC PANEL     Status: Abnormal   Collection Time    06/13/14  2:10 PM      Result Value Ref Range   Sodium 140  137 - 147 mEq/L   Potassium 4.0  3.7 - 5.3 mEq/L   Chloride 98  96 - 112 mEq/L   CO2 27  19 - 32 mEq/L   Glucose, Bld 84  70 - 99 mg/dL   BUN 45 (*) 6 - 23 mg/dL   Creatinine, Ser 3.46 (*) 0.50 - 1.35 mg/dL   Calcium 10.3  8.4 - 10.5 mg/dL   Total Protein 7.8  6.0 - 8.3 g/dL   Albumin 4.1  3.5 - 5.2 g/dL   AST 40 (*) 0 - 37 U/L   ALT 41  0 - 53 U/L   Alkaline Phosphatase 37 (*) 39 - 117 U/L   Total Bilirubin 0.8  0.3 - 1.2 mg/dL   GFR calc non Af Amer 18 (*) >90 mL/min   GFR calc Af Amer 21 (*) >90 mL/min   Comment: (NOTE)     The eGFR has been calculated using the CKD EPI equation.     This calculation has not been validated in all clinical situations.     eGFR's persistently <90 mL/min signify possible Chronic Kidney     Disease.   Anion gap 15  5 - 15    RADIOLOGY RESULTS: No results found.  MRI . 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  2. Severe right renal cortical thinning.  3. Left kidney appears normal on this noncontrast exam.  4. Cholelithiasis    Problem List: Patient Active Problem List   Diagnosis Date Noted  . Morbid obesity BMI 42 01/04/2014  . Hypertension   . Renal disorder   . Gout   . PAF (paroxysmal atrial fibrillation)     Assessment & Plan: Renal failure, obesity and need to lose weight to qualify for a renal transplant. Umbilical hernia-he also has gallstones which  we will observe since I did  not want to contaminate the umbilical hernia repair with mesh for placement of the lap band with potentially contaminated bile. We discussed the procedure again and answered questions. He is ready for his surgery next week. Orders placed in EPIC. For lapband and repair of ventral hernia    Matt B. Hassell Done, MD, [facility] [phone] beeper [phone]  06/13/2014 5:09 PM

## 2014-06-13 NOTE — Progress Notes (Signed)
Pt came for preop visit - no orders in epic - labs done per anesthesia (CBC / CMET)

## 2014-06-15 NOTE — Progress Notes (Signed)
Pt's wife contacted about surgery time change to 2:50 pm - instructed to arrive to short stay at 12:45 pm - instructed pt may have clear liquids until 8:45 am

## 2014-06-20 ENCOUNTER — Encounter (HOSPITAL_COMMUNITY)
Admission: RE | Disposition: A | Discharge status: Home / Self Care | Payer: Self-pay | Source: Ambulatory Visit | Attending: Surgery

## 2014-06-20 ENCOUNTER — Encounter (HOSPITAL_COMMUNITY): Payer: 59 | Admitting: Certified Registered Nurse Anesthetist

## 2014-06-20 ENCOUNTER — Ambulatory Visit (HOSPITAL_COMMUNITY): Payer: 59 | Admitting: Certified Registered Nurse Anesthetist

## 2014-06-20 ENCOUNTER — Encounter (HOSPITAL_COMMUNITY): Payer: Self-pay | Admitting: *Deleted

## 2014-06-20 ENCOUNTER — Observation Stay (HOSPITAL_COMMUNITY)
Admission: RE | Admit: 2014-06-20 | Discharge: 2014-06-21 | Disposition: A | Discharge status: Home / Self Care | Payer: 59 | Source: Ambulatory Visit | Attending: Surgery | Admitting: Surgery

## 2014-06-20 DIAGNOSIS — Z79899 Other long term (current) drug therapy: Secondary | ICD-10-CM | POA: Diagnosis not present

## 2014-06-20 DIAGNOSIS — N189 Chronic kidney disease, unspecified: Secondary | ICD-10-CM | POA: Diagnosis not present

## 2014-06-20 DIAGNOSIS — Z7982 Long term (current) use of aspirin: Secondary | ICD-10-CM | POA: Diagnosis not present

## 2014-06-20 DIAGNOSIS — I10 Essential (primary) hypertension: Secondary | ICD-10-CM

## 2014-06-20 DIAGNOSIS — I4891 Unspecified atrial fibrillation: Secondary | ICD-10-CM | POA: Insufficient documentation

## 2014-06-20 DIAGNOSIS — Z8719 Personal history of other diseases of the digestive system: Secondary | ICD-10-CM

## 2014-06-20 DIAGNOSIS — M109 Gout, unspecified: Secondary | ICD-10-CM | POA: Insufficient documentation

## 2014-06-20 DIAGNOSIS — Z9884 Bariatric surgery status: Secondary | ICD-10-CM

## 2014-06-20 DIAGNOSIS — K436 Other and unspecified ventral hernia with obstruction, without gangrene: Secondary | ICD-10-CM | POA: Diagnosis not present

## 2014-06-20 DIAGNOSIS — Z6841 Body Mass Index (BMI) 40.0 and over, adult: Secondary | ICD-10-CM | POA: Insufficient documentation

## 2014-06-20 DIAGNOSIS — Z9889 Other specified postprocedural states: Secondary | ICD-10-CM

## 2014-06-20 DIAGNOSIS — I129 Hypertensive chronic kidney disease with stage 1 through stage 4 chronic kidney disease, or unspecified chronic kidney disease: Secondary | ICD-10-CM | POA: Diagnosis not present

## 2014-06-20 DIAGNOSIS — E785 Hyperlipidemia, unspecified: Secondary | ICD-10-CM | POA: Diagnosis not present

## 2014-06-20 HISTORY — DX: Bariatric surgery status: Z98.84

## 2014-06-20 HISTORY — PX: INSERTION OF MESH: SHX5868

## 2014-06-20 HISTORY — PX: LAPAROSCOPIC GASTRIC BANDING: SHX1100

## 2014-06-20 LAB — CBC
HCT: 40.9 % (ref 39.0–52.0)
Hemoglobin: 13.5 g/dL (ref 13.0–17.0)
MCH: 29.5 pg (ref 26.0–34.0)
MCHC: 33 g/dL (ref 30.0–36.0)
MCV: 89.5 fL (ref 78.0–100.0)
Platelets: 191 10*3/uL (ref 150–400)
RBC: 4.57 MIL/uL (ref 4.22–5.81)
RDW: 13.2 % (ref 11.5–15.5)
WBC: 9.5 10*3/uL (ref 4.0–10.5)

## 2014-06-20 LAB — CREATININE, SERUM
CREATININE: 3.59 mg/dL — AB (ref 0.50–1.35)
GFR calc Af Amer: 20 mL/min — ABNORMAL LOW (ref >90)
GFR, EST NON AFRICAN AMERICAN: 18 mL/min — AB (ref >90)

## 2014-06-20 SURGERY — GASTRIC BANDING, LAPAROSCOPIC
Anesthesia: General | Site: Abdomen

## 2014-06-20 MED ORDER — SODIUM CHLORIDE 0.9 % IV SOLN
INTRAVENOUS | Status: DC
  Administered 2014-06-20: 1000 mL via INTRAVENOUS
  Administered 2014-06-20: 17:00:00 via INTRAVENOUS

## 2014-06-20 MED ORDER — AMLODIPINE BESYLATE 10 MG PO TABS
10.0000 mg | ORAL_TABLET | Freq: Every morning | ORAL | Status: DC
  Administered 2014-06-21: 10 mg via ORAL
  Filled 2014-06-20: qty 1

## 2014-06-20 MED ORDER — PHENYLEPHRINE HCL 10 MG/ML IJ SOLN
10.0000 mg | INTRAVENOUS | Status: DC | PRN
  Administered 2014-06-20: 10 ug/min via INTRAVENOUS

## 2014-06-20 MED ORDER — GLYCOPYRROLATE 0.2 MG/ML IJ SOLN
INTRAMUSCULAR | Status: AC
  Filled 2014-06-20: qty 3

## 2014-06-20 MED ORDER — FUROSEMIDE 40 MG PO TABS
40.0000 mg | ORAL_TABLET | Freq: Every day | ORAL | Status: DC
  Administered 2014-06-20 – 2014-06-21 (×2): 40 mg via ORAL
  Filled 2014-06-20 (×2): qty 1

## 2014-06-20 MED ORDER — NEOSTIGMINE METHYLSULFATE 10 MG/10ML IV SOLN
INTRAVENOUS | Status: AC
  Filled 2014-06-20: qty 1

## 2014-06-20 MED ORDER — UNJURY VANILLA POWDER
2.0000 [oz_av] | Freq: Four times a day (QID) | ORAL | Status: DC
  Administered 2014-06-21: 2 [oz_av] via ORAL

## 2014-06-20 MED ORDER — DEXTROSE 5 % IV SOLN
2.0000 g | INTRAVENOUS | Status: AC
  Administered 2014-06-20: 2 g via INTRAVENOUS

## 2014-06-20 MED ORDER — MIDAZOLAM HCL 2 MG/2ML IJ SOLN
INTRAMUSCULAR | Status: AC
  Filled 2014-06-20: qty 2

## 2014-06-20 MED ORDER — MIDAZOLAM HCL 5 MG/5ML IJ SOLN
INTRAMUSCULAR | Status: DC | PRN
  Administered 2014-06-20: 2 mg via INTRAVENOUS

## 2014-06-20 MED ORDER — HYDROCODONE-ACETAMINOPHEN 7.5-325 MG/15ML PO SOLN: 15.0000 mL | ORAL | Status: DC | PRN

## 2014-06-20 MED ORDER — ACETAMINOPHEN 160 MG/5ML PO SOLN: 325.0000 mg | ORAL | Status: DC | PRN

## 2014-06-20 MED ORDER — BUPIVACAINE LIPOSOME 1.3 % IJ SUSP
20.0000 mL | Freq: Once | INTRAMUSCULAR | Status: DC
  Filled 2014-06-20: qty 20

## 2014-06-20 MED ORDER — NEOSTIGMINE METHYLSULFATE 10 MG/10ML IV SOLN
INTRAVENOUS | Status: DC | PRN
  Administered 2014-06-20: 4 mg via INTRAVENOUS

## 2014-06-20 MED ORDER — SUCCINYLCHOLINE CHLORIDE 20 MG/ML IJ SOLN
INTRAMUSCULAR | Status: DC | PRN
  Administered 2014-06-20: 140 mg via INTRAVENOUS

## 2014-06-20 MED ORDER — DEXAMETHASONE SODIUM PHOSPHATE 10 MG/ML IJ SOLN
INTRAMUSCULAR | Status: DC | PRN
  Administered 2014-06-20: 10 mg via INTRAVENOUS

## 2014-06-20 MED ORDER — MORPHINE SULFATE 2 MG/ML IJ SOLN
2.0000 mg | INTRAMUSCULAR | Status: DC | PRN
  Administered 2014-06-20: 2 mg via INTRAVENOUS
  Administered 2014-06-20 – 2014-06-21 (×3): 4 mg via INTRAVENOUS
  Filled 2014-06-20 (×3): qty 2
  Filled 2014-06-20: qty 1

## 2014-06-20 MED ORDER — CISATRACURIUM BESYLATE (PF) 10 MG/5ML IV SOLN
INTRAVENOUS | Status: DC | PRN
Start: 2014-06-20 — End: 2014-06-20
  Administered 2014-06-20: 2 mg via INTRAVENOUS
  Administered 2014-06-20: 6 mg via INTRAVENOUS

## 2014-06-20 MED ORDER — PROMETHAZINE HCL 25 MG/ML IJ SOLN: 6.2500 mg | INTRAMUSCULAR | Status: DC | PRN

## 2014-06-20 MED ORDER — HEPARIN SODIUM (PORCINE) 5000 UNIT/ML IJ SOLN
5000.0000 [IU] | INTRAMUSCULAR | Status: AC
  Administered 2014-06-20: 5000 [IU] via SUBCUTANEOUS
  Filled 2014-06-20: qty 1

## 2014-06-20 MED ORDER — OXYCODONE HCL 5 MG/5ML PO SOLN: 5.0000 mg | ORAL | Status: DC | PRN

## 2014-06-20 MED ORDER — PROPOFOL 10 MG/ML IV BOLUS
INTRAVENOUS | Status: DC | PRN
  Administered 2014-06-20: 50 mg via INTRAVENOUS

## 2014-06-20 MED ORDER — SODIUM CHLORIDE 0.9 % IJ SOLN
INTRAMUSCULAR | Status: AC
  Filled 2014-06-20: qty 30

## 2014-06-20 MED ORDER — PHENYLEPHRINE HCL 10 MG/ML IJ SOLN
INTRAMUSCULAR | Status: AC
Start: 2014-06-20 — End: 2014-06-20
  Filled 2014-06-20: qty 1

## 2014-06-20 MED ORDER — PROPOFOL 10 MG/ML IV BOLUS
INTRAVENOUS | Status: AC
  Filled 2014-06-20: qty 20

## 2014-06-20 MED ORDER — DEXTROSE 5 % IV SOLN
INTRAVENOUS | Status: AC
  Filled 2014-06-20: qty 2

## 2014-06-20 MED ORDER — HEPARIN SODIUM (PORCINE) 5000 UNIT/ML IJ SOLN
5000.0000 [IU] | Freq: Three times a day (TID) | INTRAMUSCULAR | Status: DC
  Administered 2014-06-21: 5000 [IU] via SUBCUTANEOUS
  Filled 2014-06-20 (×4): qty 1

## 2014-06-20 MED ORDER — ACETAMINOPHEN 160 MG/5ML PO SOLN: 650.0000 mg | ORAL | Status: DC | PRN

## 2014-06-20 MED ORDER — SODIUM CHLORIDE 0.9 % IJ SOLN
INTRAMUSCULAR | Status: AC
  Filled 2014-06-20: qty 10

## 2014-06-20 MED ORDER — LACTATED RINGERS IV SOLN
INTRAVENOUS | Status: DC
Start: 2014-06-20 — End: 2014-06-20

## 2014-06-20 MED ORDER — METOPROLOL TARTRATE 50 MG PO TABS
50.0000 mg | ORAL_TABLET | Freq: Every morning | ORAL | Status: DC
  Administered 2014-06-21: 50 mg via ORAL
  Filled 2014-06-20: qty 1

## 2014-06-20 MED ORDER — ONDANSETRON HCL 4 MG/2ML IJ SOLN
INTRAMUSCULAR | Status: DC | PRN
  Administered 2014-06-20: 4 mg via INTRAVENOUS

## 2014-06-20 MED ORDER — UNJURY CHICKEN SOUP POWDER: 2.0000 [oz_av] | Freq: Four times a day (QID) | ORAL | Status: DC

## 2014-06-20 MED ORDER — HYDROMORPHONE HCL PF 1 MG/ML IJ SOLN: 0.2500 mg | INTRAMUSCULAR | Status: DC | PRN

## 2014-06-20 MED ORDER — PHENYLEPHRINE HCL 10 MG/ML IJ SOLN
INTRAMUSCULAR | Status: DC | PRN
  Administered 2014-06-20 (×2): 40 ug via INTRAVENOUS

## 2014-06-20 MED ORDER — LIDOCAINE HCL (CARDIAC) 20 MG/ML IV SOLN
INTRAVENOUS | Status: DC | PRN
  Administered 2014-06-20: 60 mg via INTRAVENOUS

## 2014-06-20 MED ORDER — ONDANSETRON HCL 4 MG/2ML IJ SOLN
INTRAMUSCULAR | Status: AC
  Filled 2014-06-20: qty 2

## 2014-06-20 MED ORDER — GLYCOPYRROLATE 0.2 MG/ML IJ SOLN
INTRAMUSCULAR | Status: DC | PRN
  Administered 2014-06-20: 0.6 mg via INTRAVENOUS
  Administered 2014-06-20 (×2): 0.2 mg via INTRAVENOUS

## 2014-06-20 MED ORDER — SODIUM CHLORIDE 0.9 % IV SOLN
INTRAVENOUS | Status: DC
  Administered 2014-06-20 – 2014-06-21 (×2): via INTRAVENOUS

## 2014-06-20 MED ORDER — PHENYLEPHRINE 40 MCG/ML (10ML) SYRINGE FOR IV PUSH (FOR BLOOD PRESSURE SUPPORT)
PREFILLED_SYRINGE | INTRAVENOUS | Status: AC
  Filled 2014-06-20: qty 10

## 2014-06-20 MED ORDER — EPHEDRINE SULFATE 50 MG/ML IJ SOLN
INTRAMUSCULAR | Status: DC | PRN
  Administered 2014-06-20: 10 mg via INTRAVENOUS

## 2014-06-20 MED ORDER — SODIUM CHLORIDE 0.9 % IJ SOLN
INTRAMUSCULAR | Status: DC | PRN
  Administered 2014-06-20: 20 mL via INTRAVENOUS

## 2014-06-20 MED ORDER — LACTATED RINGERS IV SOLN: INTRAVENOUS | Status: DC

## 2014-06-20 MED ORDER — UNJURY CHOCOLATE CLASSIC POWDER
2.0000 [oz_av] | Freq: Four times a day (QID) | ORAL | Status: DC
  Administered 2014-06-21: 2 [oz_av] via ORAL

## 2014-06-20 MED ORDER — BUPIVACAINE LIPOSOME 1.3 % IJ SUSP
INTRAMUSCULAR | Status: DC | PRN
  Administered 2014-06-20: 20 mL

## 2014-06-20 MED ORDER — ONDANSETRON HCL 4 MG/2ML IJ SOLN: 4.0000 mg | INTRAMUSCULAR | Status: DC | PRN

## 2014-06-20 MED ORDER — EPHEDRINE SULFATE 50 MG/ML IJ SOLN
INTRAMUSCULAR | Status: AC
  Filled 2014-06-20: qty 1

## 2014-06-20 MED ORDER — FENTANYL CITRATE 0.05 MG/ML IJ SOLN
INTRAMUSCULAR | Status: DC | PRN
  Administered 2014-06-20: 50 ug via INTRAVENOUS
  Administered 2014-06-20: 100 ug via INTRAVENOUS
  Administered 2014-06-20 (×2): 50 ug via INTRAVENOUS

## 2014-06-20 MED ORDER — LIDOCAINE HCL (CARDIAC) 20 MG/ML IV SOLN
INTRAVENOUS | Status: AC
  Filled 2014-06-20: qty 5

## 2014-06-20 MED ORDER — FENTANYL CITRATE 0.05 MG/ML IJ SOLN
INTRAMUSCULAR | Status: AC
  Filled 2014-06-20: qty 5

## 2014-06-20 SURGICAL SUPPLY — 55 items
BAND LAP 10.0 W/TUBES (Band) ×3 IMPLANT
BENZOIN TINCTURE PRP APPL 2/3 (GAUZE/BANDAGES/DRESSINGS) IMPLANT
BLADE SURG 15 STRL LF DISP TIS (BLADE) ×2 IMPLANT
BLADE SURG 15 STRL SS (BLADE) ×1
DECANTER SPIKE VIAL GLASS SM (MISCELLANEOUS) ×6 IMPLANT
DERMABOND ADVANCED (GAUZE/BANDAGES/DRESSINGS) ×1
DERMABOND ADVANCED .7 DNX12 (GAUZE/BANDAGES/DRESSINGS) ×2 IMPLANT
DEVICE SUT QUICK LOAD TK 5 (STAPLE) ×9 IMPLANT
DEVICE SUT TI-KNOT TK 5X26 (MISCELLANEOUS) ×3 IMPLANT
DEVICE SUTURE ENDOST 10MM (ENDOMECHANICALS) IMPLANT
DISSECTOR BLUNT TIP ENDO 5MM (MISCELLANEOUS) IMPLANT
DRAPE CAMERA CLOSED 9X96 (DRAPES) ×3 IMPLANT
ELECT REM PT RETURN 9FT ADLT (ELECTROSURGICAL) ×3
ELECTRODE REM PT RTRN 9FT ADLT (ELECTROSURGICAL) ×2 IMPLANT
GAUZE SPONGE 4X4 12PLY STRL (GAUZE/BANDAGES/DRESSINGS) ×3 IMPLANT
GLOVE BIOGEL M 8.0 STRL (GLOVE) ×3 IMPLANT
GOWN SPEC L4 XLG W/TWL (GOWN DISPOSABLE) ×3 IMPLANT
GOWN STRL REUS W/TWL XL LVL3 (GOWN DISPOSABLE) ×9 IMPLANT
HOVERMATT SINGLE USE (MISCELLANEOUS) ×3 IMPLANT
KIT BASIN OR (CUSTOM PROCEDURE TRAY) ×3 IMPLANT
MESH HERNIA 1X4 RECT BARD (Mesh General) ×2 IMPLANT
MESH HERNIA BARD 1X4 (Mesh General) ×1 IMPLANT
MESH VENTRALEX ST 8CM LRG (Mesh General) ×3 IMPLANT
NEEDLE SPNL 22GX3.5 QUINCKE BK (NEEDLE) ×3 IMPLANT
PACK UNIVERSAL I (CUSTOM PROCEDURE TRAY) ×3 IMPLANT
SCISSORS LAP 5X45 EPIX DISP (ENDOMECHANICALS) IMPLANT
SET IRRIG TUBING LAPAROSCOPIC (IRRIGATION / IRRIGATOR) IMPLANT
SHEARS CURVED HARMONIC AC 45CM (MISCELLANEOUS) IMPLANT
SLEEVE ADV FIXATION 5X100MM (TROCAR) IMPLANT
SLEEVE ENDOPATH XCEL 5M (ENDOMECHANICALS) ×6 IMPLANT
SOLUTION ANTI FOG 6CC (MISCELLANEOUS) ×3 IMPLANT
SPONGE LAP 18X18 X RAY DECT (DISPOSABLE) ×3 IMPLANT
STAPLER VISISTAT 35W (STAPLE) ×3 IMPLANT
STRIP CLOSURE SKIN 1/2X4 (GAUZE/BANDAGES/DRESSINGS) IMPLANT
SUT ETHIBOND 2 0 SH (SUTURE) ×3
SUT ETHIBOND 2 0 SH 36X2 (SUTURE) ×6 IMPLANT
SUT NOVA NAB GS-21 0 18 T12 DT (SUTURE) ×6 IMPLANT
SUT PROLENE 2 0 CT2 30 (SUTURE) ×3 IMPLANT
SUT SILK 0 (SUTURE) ×1
SUT SILK 0 30XBRD TIE 6 (SUTURE) ×2 IMPLANT
SUT SURGIDAC NAB ES-9 0 48 120 (SUTURE) IMPLANT
SUT VIC AB 2-0 SH 27 (SUTURE)
SUT VIC AB 2-0 SH 27X BRD (SUTURE) IMPLANT
SUT VIC AB 4-0 SH 18 (SUTURE) ×3 IMPLANT
SYRINGE 20CC LL (MISCELLANEOUS) ×6 IMPLANT
TACKER 5MM HERNIA 3.5CML NAB (ENDOMECHANICALS) ×3 IMPLANT
TOWEL OR 17X26 10 PK STRL BLUE (TOWEL DISPOSABLE) ×3 IMPLANT
TOWEL OR NON WOVEN STRL DISP B (DISPOSABLE) ×3 IMPLANT
TROCAR ADV FIXATION 12X100MM (TROCAR) ×3 IMPLANT
TROCAR BLADELESS 15MM (ENDOMECHANICALS) ×3 IMPLANT
TROCAR BLADELESS OPT 5 100 (ENDOMECHANICALS) ×3 IMPLANT
TROCAR XCEL NON-BLD 11X100MML (ENDOMECHANICALS) IMPLANT
TROCAR XCEL UNIV SLVE 11M 100M (ENDOMECHANICALS) IMPLANT
TUBE CALIBRATION LAPBAND (TUBING) ×3 IMPLANT
TUBING INSUFFLATION 10FT LAP (TUBING) ×3 IMPLANT

## 2014-06-20 NOTE — Anesthesia Postprocedure Evaluation (Signed)
  Anesthesia Post-op Note  Patient: [name] [name]  Procedure(s) Performed: Procedure(s) (LRB): LAPAROSCOPIC GASTRIC BANDING and VENTRAL HERNIA REPAIR WITH MESH (N/A) INSERTION OF MESH (N/A)  Patient Location: PACU  Anesthesia Type: General  Level of Consciousness: awake and alert   Airway and Oxygen Therapy: Patient Spontanous Breathing  Post-op Pain: mild  Post-op Assessment: Post-op Vital signs reviewed, Patient's Cardiovascular Status Stable, Respiratory Function Stable, Patent Airway and No signs of Nausea or vomiting  Last Vitals:  Filed Vitals:   06/20/14 1756  BP:   Pulse:   Temp: 36.7 C  Resp:     Post-op Vital Signs: stable   Complications: No apparent anesthesia complications

## 2014-06-20 NOTE — Interval H&P Note (Signed)
History and Physical Interval Note:  06/20/2014 3:05 PM  [name] [name]  has presented today for surgery, with the diagnosis of morbid obesity umbilical hernia   The various methods of treatment have been discussed with the patient and family. After consideration of risks, benefits and other options for treatment, the patient has consented to  Procedure(s): LAPAROSCOPIC GASTRIC BANDING and umbilical hernia repair  (N/A) INSERTION OF MESH (N/A) as a surgical intervention .  The patient's history has been reviewed, patient examined, no change in status, stable for surgery.  I have reviewed the patient's chart and labs.  Questions were answered to the patient's satisfaction.     Milayna Rotenberg B

## 2014-06-20 NOTE — Op Note (Signed)
06/20/2014  Surgeon: Kaylyn Lim, MD, FACS Asst:  Alphonsa Overall, MD, FACS  Procedure: Laparoscopic adjustable gastric banding with APS system;  Repair of 1.5 cm incarcerated ventral hernia (omentum)  Anes:  General  EBL:  Minimal  Description of Procedure  The patient was taken to OR # 1 and given general anesthesia.  After a prep with PCMX the patient was draped and a timeout performed.  Access to the abdomen was achieved with a 0 degree Optiview technique through the left upper quadrant.    Adhesions were marked with a large mass of omentum incarcerated in the supraumbilical ventral hernia.  This was manually extracted revealing a fascial defect and a large sac.  Ports were placed to the the right of the midline including a 15 trocar in  the right upper quadrant placed obliquely.  The Sherrie Sport was used to retract the left lateral segment and the peritoneum was incised along the left crus.   The EJ junction as assessed for a hiatus hernia and no hiatal hernia was seen.  A balloon test was not performed.    The pars flaccida technique was utilized to insert the blunt "finger " dissector from right to left behind the stomach.  This created a target zone to pass the band passer.     The lapband APS  Had been previously flushed and was inserted through the 15 trocar.  It was placed in the tip of "the finger"  and pulled around behind the stomach. It was snapped in place over calibration tubing.    The band was plicated with 3 sutures of surgidec with a free needle.  Position looked good.  A transverse incision was then made over the hernia sac which was dissected from the surrounding tissue and then amputated.  The resultant defect was < 2 cm and a 3.2 inch Ventralex patch was sutured horizontally and above and below before closing the mesh primarily.  This was checked laparoscopically and one suture was replaced.  A Protac was used to completely secure it anteriorally.    The tubing was brought  out through the lower incision on the right and connected to the port which had mesh sewn onto the back and was placed into the subcutaneous pocket. Incisions were closed with 4-0 vicryl.    The incisions were injected with nothing because marcaine is excreted by the kidney and he has borderline renal failure and closed with 4-0 vicryl and Liquiband.     The patient was taken to the PACU in stable condition.    [name] [name] Done, [place], [facility], [place]

## 2014-06-20 NOTE — Progress Notes (Signed)
PACU Nursing Note: Pt has AV graft in Left Upper Area, pt states has been non-functioning since insertion, therfore no thrill or bruit noted, will continue to implement "Restricted Extremity" precautions, CMS assessed of Lt arm, w/ Lt radial pulse 2+, cap refill wnl and motor/ sensory wnl.   Stormy Card, BSN, RN, CPAN, CCRN

## 2014-06-20 NOTE — H&P (View-Only) (Signed)
Chief Complaint:  Morbid obesity and occult renal failure-impending need for kidney transplant  History of Present Illness:  [name] [name] is an 53 y.o. male 2  saw him last has lost 12 pounds. At that time we talked about a 10 carbon dioxide and I suggested ways to avoid salt. Since then his creatinine apparently has gone up a little and his blood pressure has gone down and he is feeling much better. They have decided they would like to move forward with laparoscopic adjustable gastric band and I will agree to do that. 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   Past Medical History  Diagnosis Date  . Gout   . PAF (paroxysmal atrial fibrillation)   . Hypertension   . Dysrhythmia     EPISODES OF A FIB "EVERY FEW YRS" WAS GIVEN METOPROLOL TO HELP CONTROL - NO PROBLEM FOR PAST YEAR - FOLLOWED BY DR.L NISHAN  . Hyperlipidemia   . Arthritis   . Renal insufficiency     DIDNEY DISEASE - FOLLOWED BY DR. Lorrene Reid NOTES ON CHART    Past Surgical History  Procedure Laterality Date  . Knee surgery  2001  . Av fistula placement      "IT CLOSED UP AND DOES NOT WORK"  . Cataracts removed      Current Outpatient Prescriptions  Medication Sig Dispense Refill  . amLODipine (NORVASC) 10 MG tablet Take 10 mg by mouth every morning.       Marland Kitchen aspirin EC 81 MG tablet Take 81 mg by mouth daily.      Marland Kitchen atorvastatin (LIPITOR) 10 MG tablet Take 10 mg by mouth every morning.       . Febuxostat (ULORIC) 80 MG TABS Take 80 mg by mouth daily.      . fenofibrate 160 MG tablet Take 160 mg by mouth daily.      . furosemide (LASIX) 40 MG tablet Take 40 mg by mouth daily.      Marland Kitchen HYDROcodone-acetaminophen (HYCET) 7.5-325 mg/15 ml solution Take 15 mLs by mouth every 4 (four) hours as needed for moderate pain.  300 mL  0  . metoprolol (LOPRESSOR) 50 MG tablet Take 50 mg by mouth every morning.      . Vitamin D, Ergocalciferol, (DRISDOL) 50000 UNITS CAPS capsule Take 50,000 Units by mouth every 7 (seven) days.  Wednesday       No current facility-administered medications for this visit.   Review of patient's allergies indicates no known allergies. Family History  Problem Relation Age of Onset  . CAD    . Hypertension Mother   . Cancer Father     bone  . Cancer Sister     breast  . Cancer Sister     breast   Social History:   reports that he has never smoked. He has never used smokeless tobacco. He reports that he does not drink alcohol or use illicit drugs.   REVIEW OF SYSTEMS - PERTINENT POSITIVES ONLY: Positive for occult hypertension probably leading to his renal failure.  Physical Exam:   Blood pressure 138/86, pulse 66, temperature 97.1 F (36.2 C), temperature source Temporal, resp. rate 16, height _0  (1.778 m), weight 293 lb 6.4 oz (133.085 kg). Body mass index is 42.1 kg/(m^2).  Gen:  WDWN white male NAD  Neurological: Alert and oriented to person, place, and time. Motor and sensory function is grossly intact  Head: Normocephalic and atraumatic.  Eyes: Conjunctivae are normal. Pupils are equal, round,  and reactive to light. No scleral icterus.  Neck: Normal range of motion. Neck supple. No tracheal deviation or thyromegaly present.  Cardiovascular:  SR without murmurs or gallops.  No carotid bruits Respiratory: Effort normal.  No respiratory distress. No chest wall tenderness. Breath sounds normal.  No wheezes, rales or rhonchi.  Abdomen:  Umbilical hernia the would need to be repaired at the time of his lap band. GU: Musculoskeletal: Normal range of motion. Extremities are nontender. No cyanosis, edema or clubbing noted Lymphadenopathy: No cervical, preauricular, postauricular or axillary adenopathy is present Skin: Skin is warm and dry. No rash noted. No diaphoresis. No erythema. No pallor. Pscyh: Normal mood and affect. Behavior is normal. Judgment and thought content normal.   LABORATORY RESULTS: Results for orders placed during the hospital encounter of 06/13/14  (from the past 48 hour(s))  CBC     Status: None   Collection Time    06/13/14  2:10 PM      Result Value Ref Range   WBC 6.3  4.0 - 10.5 K/uL   RBC 4.77  4.22 - 5.81 MIL/uL   Hemoglobin 14.1  13.0 - 17.0 g/dL   HCT 41.9  39.0 - 52.0 %   MCV 87.8  78.0 - 100.0 fL   MCH 29.6  26.0 - 34.0 pg   MCHC 33.7  30.0 - 36.0 g/dL   RDW 12.9  11.5 - 15.5 %   Platelets 236  150 - 400 K/uL  COMPREHENSIVE METABOLIC PANEL     Status: Abnormal   Collection Time    06/13/14  2:10 PM      Result Value Ref Range   Sodium 140  137 - 147 mEq/L   Potassium 4.0  3.7 - 5.3 mEq/L   Chloride 98  96 - 112 mEq/L   CO2 27  19 - 32 mEq/L   Glucose, Bld 84  70 - 99 mg/dL   BUN 45 (*) 6 - 23 mg/dL   Creatinine, Ser 3.46 (*) 0.50 - 1.35 mg/dL   Calcium 10.3  8.4 - 10.5 mg/dL   Total Protein 7.8  6.0 - 8.3 g/dL   Albumin 4.1  3.5 - 5.2 g/dL   AST 40 (*) 0 - 37 U/L   ALT 41  0 - 53 U/L   Alkaline Phosphatase 37 (*) 39 - 117 U/L   Total Bilirubin 0.8  0.3 - 1.2 mg/dL   GFR calc non Af Amer 18 (*) >90 mL/min   GFR calc Af Amer 21 (*) >90 mL/min   Comment: (NOTE)     The eGFR has been calculated using the CKD EPI equation.     This calculation has not been validated in all clinical situations.     eGFR's persistently <90 mL/min signify possible Chronic Kidney     Disease.   Anion gap 15  5 - 15    RADIOLOGY RESULTS: No results found.  MRI . 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  2. Severe right renal cortical thinning.  3. Left kidney appears normal on this noncontrast exam.  4. Cholelithiasis    Problem List: Patient Active Problem List   Diagnosis Date Noted  . Morbid obesity BMI 42 01/04/2014  . Hypertension   . Renal disorder   . Gout   . PAF (paroxysmal atrial fibrillation)     Assessment & Plan: Renal failure, obesity and need to lose weight to qualify for a renal transplant. Umbilical hernia-he also has gallstones which  we will observe since I did  not want to contaminate the umbilical hernia repair with mesh for placement of the lap band with potentially contaminated bile. We discussed the procedure again and answered questions. He is ready for his surgery next week. Orders placed in EPIC. For lapband and repair of ventral hernia    Matt B. Hassell Done, MD, [facility] [phone] beeper [phone]  06/13/2014 5:09 PM

## 2014-06-20 NOTE — Transfer of Care (Signed)
Immediate Anesthesia Transfer of Care Note  Patient: [name] [name]  Procedure(s) Performed: Procedure(s): LAPAROSCOPIC GASTRIC BANDING and VENTRAL HERNIA REPAIR WITH MESH (N/A) INSERTION OF MESH (N/A)  Patient Location: PACU  Anesthesia Type:General  Level of Consciousness: awake, alert , oriented and patient cooperative  Airway & Oxygen Therapy: Patient Spontanous Breathing and Patient connected to face mask oxygen  Post-op Assessment: Report given to PACU RN, Post -op Vital signs reviewed and stable and Patient moving all extremities X 4  Post vital signs: stable  Complications: No apparent anesthesia complications

## 2014-06-20 NOTE — Anesthesia Preprocedure Evaluation (Addendum)
Anesthesia Evaluation  Patient identified by MRN, date of birth, ID band Patient awake    Reviewed: Allergy & Precautions, H&P , NPO status , Patient's Chart, lab work & pertinent test results  Airway Mallampati: III TM Distance: >3 FB Neck ROM: Full    Dental  (+) Teeth Intact, Dental Advisory Given   Pulmonary neg pulmonary ROS,  breath sounds clear to auscultation  Pulmonary exam normal       Cardiovascular hypertension, Pt. on medications and Pt. on home beta blockers + dysrhythmias Atrial Fibrillation Rhythm:Regular Rate:Normal     Neuro/Psych negative neurological ROS  negative psych ROS   GI/Hepatic negative GI ROS, Neg liver ROS,   Endo/Other  Morbid obesity  Renal/GU CRFRenal disease  negative genitourinary   Musculoskeletal negative musculoskeletal ROS (+)   Abdominal   Peds  Hematology negative hematology ROS (+)   Anesthesia Other Findings   Reproductive/Obstetrics                         Anesthesia Physical Anesthesia Plan  ASA: III  Anesthesia Plan: General   Post-op Pain Management:    Induction: Intravenous  Airway Management Planned: Oral ETT  Additional Equipment:   Intra-op Plan:   Post-operative Plan: Extubation in OR  Informed Consent: I have reviewed the patients History and Physical, chart, labs and discussed the procedure including the risks, benefits and alternatives for the proposed anesthesia with the patient or authorized representative who has indicated his/her understanding and acceptance.   Dental advisory given  Plan Discussed with: CRNA  Anesthesia Plan Comments:         Anesthesia Quick Evaluation

## 2014-06-21 ENCOUNTER — Telehealth (INDEPENDENT_AMBULATORY_CARE_PROVIDER_SITE_OTHER): Payer: Self-pay

## 2014-06-21 ENCOUNTER — Encounter (HOSPITAL_COMMUNITY): Payer: Self-pay | Admitting: Surgery

## 2014-06-21 ENCOUNTER — Observation Stay (HOSPITAL_COMMUNITY): Payer: 59

## 2014-06-21 DIAGNOSIS — Z8719 Personal history of other diseases of the digestive system: Secondary | ICD-10-CM

## 2014-06-21 DIAGNOSIS — Z9889 Other specified postprocedural states: Secondary | ICD-10-CM

## 2014-06-21 HISTORY — DX: Personal history of other diseases of the digestive system: Z87.19

## 2014-06-21 LAB — CBC WITH DIFFERENTIAL/PLATELET
Basophils Absolute: 0 10*3/uL (ref 0.0–0.1)
Basophils Relative: 0 % (ref 0–1)
Eosinophils Absolute: 0 10*3/uL (ref 0.0–0.7)
Eosinophils Relative: 0 % (ref 0–5)
HCT: 41.5 % (ref 39.0–52.0)
Hemoglobin: 13.5 g/dL (ref 13.0–17.0)
LYMPHS ABS: 0.6 10*3/uL — AB (ref 0.7–4.0)
Lymphocytes Relative: 7 % — ABNORMAL LOW (ref 12–46)
MCH: 29.5 pg (ref 26.0–34.0)
MCHC: 32.5 g/dL (ref 30.0–36.0)
MCV: 90.8 fL (ref 78.0–100.0)
Monocytes Absolute: 0.4 10*3/uL (ref 0.1–1.0)
Monocytes Relative: 4 % (ref 3–12)
NEUTROS PCT: 89 % — AB (ref 43–77)
Neutro Abs: 7.7 10*3/uL (ref 1.7–7.7)
PLATELETS: 195 10*3/uL (ref 150–400)
RBC: 4.57 MIL/uL (ref 4.22–5.81)
RDW: 13.4 % (ref 11.5–15.5)
WBC: 8.7 10*3/uL (ref 4.0–10.5)

## 2014-06-21 LAB — BASIC METABOLIC PANEL
Anion gap: 14 (ref 5–15)
BUN: 53 mg/dL — ABNORMAL HIGH (ref 6–23)
CO2: 22 mEq/L (ref 19–32)
Calcium: 9.8 mg/dL (ref 8.4–10.5)
Chloride: 105 mEq/L (ref 96–112)
Creatinine, Ser: 3.61 mg/dL — ABNORMAL HIGH (ref 0.50–1.35)
GFR calc non Af Amer: 18 mL/min — ABNORMAL LOW (ref >90)
GFR, EST AFRICAN AMERICAN: 20 mL/min — AB (ref >90)
GLUCOSE: 147 mg/dL — AB (ref 70–99)
POTASSIUM: 4.8 meq/L (ref 3.7–5.3)
SODIUM: 141 meq/L (ref 137–147)

## 2014-06-21 MED ORDER — IOHEXOL 300 MG/ML  SOLN
25.0000 mL | Freq: Once | INTRAMUSCULAR | Status: AC | PRN
  Administered 2014-06-21: 20 mL via ORAL

## 2014-06-21 NOTE — Telephone Encounter (Signed)
LMOM to call back po.

## 2014-06-21 NOTE — Progress Notes (Signed)
Nutrition Education Note  Received consult for diet education per DROP protocol.   Discussed 2 week post op diet with pt. Emphasized that liquids must be non carbonated, non caffeinated, and sugar free. Fluid goals discussed. Pt to follow up with outpatient bariatric RD for further diet progression after 2 weeks. Multivitamins and minerals also reviewed. Teach back method used, pt expressed understanding, expect good compliance.   Diet: First 2 Weeks  You will see the nutritionist about two (2) weeks after your surgery. The nutritionist will increase the types of foods you can eat if you are handling liquids well:  If you have severe vomiting or nausea and cannot handle clear liquids lasting longer than 1 day, call your surgeon  Protein Shake  Drink at least 2 ounces of shake 5-6 times per day  Each serving of protein shakes (usually 8 - 12 ounces) should have a minimum of:  15 grams of protein  And no more than 5 grams of carbohydrate  Goal for protein each day:  Men = 80 grams per day  Women = 60 grams per day  Protein powder may be added to fluids such as non-fat milk or Lactaid milk or Soy milk (limit to 35 grams added protein powder per serving)   Hydration  Slowly increase the amount of water and other clear liquids as tolerated (See Acceptable Fluids)  Slowly increase the amount of protein shake as tolerated  Sip fluids slowly and throughout the day  May use sugar substitutes in small amounts (no more than 6 - 8 packets per day; i.e. Splenda)   Fluid Goal  The first goal is to drink at least 8 ounces of protein shake/drink per day (or as directed by the nutritionist); some examples of protein shakes are [employer], [employer], EAS Edge HP, and Unjury. See handout from pre-op Bariatric Education Class:  Slowly increase the amount of protein shake you drink as tolerated  You may find it easier to slowly sip shakes throughout the day  It is important to get your proteins  in first  Your fluid goal is to drink 64 - 100 ounces of fluid daily  It may take a few weeks to build up to this  32 oz (or more) should be clear liquids  And  32 oz (or more) should be full liquids (see below for examples)  Liquids should not contain sugar, caffeine, or carbonation   Clear Liquids:  Water or Sugar-free flavored water (i.e. Fruit H2O, Propel)  Decaffeinated coffee or tea (sugar-free)  Crystal Lite, Wyler's Lite, Minute Maid Lite  Sugar-free Jell-O  Bouillon or broth  Sugar-free Popsicle: *Less than 20 calories each; Limit 1 per day   Full Liquids:  Protein Shakes/Drinks + 2 choices per day of other full liquids  Full liquids must be:  No More Than 12 grams of Carbs per serving  No More Than 3 grams of Fat per serving  Strained low-fat cream soup  Non-Fat milk  Fat-free Lactaid Milk  Sugar-free yogurt (Dannon Lite & Fit, [place] yogurt)     [place] MS, RD, [place] [phone] Pager [phone] Weekend/After Hours Pager

## 2014-06-21 NOTE — Progress Notes (Signed)
Pt stated his last undocumented void was around 1100  AM before his surgery. Since after his surgery, his  documented total urine output around 2340 was only 75 cc and a bladder scan volume of 200 ml. 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 MD aware of pt's idiopathic renal failure as per note. Will continue to monitor.

## 2014-06-21 NOTE — Discharge Summary (Signed)
Physician Discharge Summary  Patient ID: [name] [name] MRN: [id number] DOB/AGE: [birth year] 53 y.o.  Admit date: 06/20/2014 Discharge date: 06/21/2014  Admission Diagnoses:  Renal failure and obesity precluding renal transplantation; ventral hernia  Discharge Diagnoses:  same  Active Problems:   Lapband APS July 2015   Status post gastric banding   Surgery:  Lapband APS  And open ventral hernia repair with mesh  Discharged Condition: improved  Hospital Course:   Had surgery.  PD 1 had UGI to assess band which looked good.  Ready for discharge  Consults: none  Significant Diagnostic Studies: UGI    Discharge Exam: Blood pressure 134/79, pulse 66, temperature 98.6 F (37 C), temperature source Oral, resp. rate 18, height 5\' 10"  (1.778 m), weight 287 lb (130.182 kg), SpO2 99.00%. Incisions OK  Disposition: 01-Home or Self Care  Discharge Instructions   Discharge instructions    Complete by:  As directed   Follow bariatric dietary guidelines until first fill in approximately 6 weeks     Increase activity slowly    Complete by:  As directed      No wound care    Complete by:  As directed             Medication List         amLODipine 10 MG tablet  Commonly known as:  NORVASC  Take 10 mg by mouth every morning.     aspirin EC 81 MG tablet  Take 81 mg by mouth daily.     atorvastatin 10 MG tablet  Commonly known as:  LIPITOR  Take 10 mg by mouth every morning.     fenofibrate 160 MG tablet  Take 160 mg by mouth daily.     furosemide 40 MG tablet  Commonly known as:  LASIX  Take 40 mg by mouth daily.     HYDROcodone-acetaminophen 7.5-325 mg/15 ml solution  Commonly known as:  HYCET  Take 15 mLs by mouth every 4 (four) hours as needed for moderate pain.     metoprolol 50 MG tablet  Commonly known as:  LOPRESSOR  Take 50 mg by mouth every morning.     ULORIC 80 MG Tabs  Generic drug:  Febuxostat  Take 80 mg by mouth daily.     Vitamin D  (Ergocalciferol) 50000 UNITS Caps capsule  Commonly known as:  DRISDOL  Take 50,000 Units by mouth every 7 (seven) days. Wednesday           Follow-up Information   Follow up with Pedro Earls, MD.   Specialty:  General Surgery   Contact information:   [address] [place] [address] [phone]       Signed: Pedro Earls 06/21/2014, 1:32 PM

## 2014-06-21 NOTE — Progress Notes (Signed)
Patient is alert and oriented.  Pain is controlled, and patient is tolerating fluids.  Advanced to protein shake today.  Reviewed Adjustable gastric band discharge instructions with patient, patient able to articulate understanding.  Provided information on BELT program, Support Group and [facility] outpatient pharmacy. All questions answered, will continue to monitor.

## 2014-06-21 NOTE — Discharge Instructions (Signed)
Laparoscopic Gastric Band Surgery Care After These instructions give you information on caring for yourself after your procedure. Your doctor may also give you more specific instructions. Call your doctor if you have any problems or questions after your procedure. HOME CARE   Take walks throughout the day. Do not sit for longer than 1 hour while awake for 4 to 6 weeks.  You may shower 2 days after surgery. Pat the surgery cuts (incisions) dry. Do not rub the surgery cuts.  Do your coughing and deep breathing exercises.  Do not lift, push, or pull anything heavy until your doctor says it is okay.  Only take medicines as told by your doctor. Do not drive while taking pain medicine.  Drink plenty of fluids to keep your pee (urine) clear or pale yellow.  Stay on a liquid diet as long as your doctor tells you to.  Do not drink caffeine for 1 month.  Change bandages (dressings) as told by your doctor.  Check your surgery cuts for redness, pufffiness (swelling), abnormal coloring, fluid, or bleeding.  Follow your doctor's advice about vitamin and protein needs after surgery. GET HELP RIGHT AWAY IF:  You feel sick to your stomach (nauseous) and throw up (vomit).  You have pain and discomfort with swallowing.  You develop shortness of breath or difficulty breathing.  You have pain, puffiness, or feel warmth on your lower body.  You have very bad calf pain or pain not relieved by medicine.  You have a temperature by mouth above 102 F (38.9 C).  Your surgery cuts look red, puffy, or they leak fluid.  Your poop (stool) is black, tarry, or dark red.  You have chills.  You have chest pain.  You feel confused.  You have slurred speech.  You feel lightheaded when standing.  You suddenly feel weak.  You have any questions or concerns. MAKE SURE YOU:   Understand these instructions.  Will watch your condition.  Will get help right away if you are not doing well or get  worse. Document Released: 12/20/2010 Document Revised: 03/14/2013 Document Reviewed: 12/20/2010 [facility] Patient Information 2015 [place], [place]. This information is not intended to replace advice given to you by your health care provider. Make sure you discuss any questions you have with your health care provider.     GASTRIC BYPASS/SLEEVE  Home Care Instructions   These instructions are to help you care for yourself when you go home.  Call: If you have any problems. Call [phone] and ask for the surgeon on call If you need immediate assistance come to the ER at [facility]. Tell the ER staff you are a new post-op gastric bypass or gastric sleeve patient  Signs and symptoms to report: Severe  vomiting or nausea If you cannot handle clear liquids for longer than 1 day, call your surgeon Abdominal pain which does not get better after taking your pain medication Fever greater than 100.4  F and chills Heart rate over 100 beats a minute Trouble breathing Chest pain Redness,  swelling, drainage, or foul odor at incision (surgical) sites If your incisions open or pull apart Swelling or pain in calf (lower leg) Diarrhea (Loose bowel movements that happen often), frequent watery, uncontrolled bowel movements Constipation, (no bowel movements for 3 days) if this happens: Take Milk of Magnesia, 2 tablespoons by mouth, 3 times a day for 2 days if needed Stop taking Milk of Magnesia once you have had a bowel movement Call your doctor if  constipation continues Or Take Miralax  (instead of Milk of Magnesia) following the label instructions Stop taking Miralax once you have had a bowel movement Call your doctor if constipation continues Anything you think is abnormal for you   Normal side effects after surgery: Unable to sleep at night or unable to concentrate Irritability Being tearful (crying) or depressed  These are common complaints, possibly related to your anesthesia, stress of  surgery, and change in lifestyle, that usually go away a few weeks after surgery. If these feelings continue, call your medical doctor.  Wound Care: You may have surgical glue, steri-strips, or staples over your incisions after surgery Surgical glue: Looks like clear film over your incisions and will wear off a little at a time Steri-strips: Adhesive strips of tape over your incisions. You may notice a yellowish color on skin under the steri-strips. This is used to make the steri-strips stick better. Do not pull the steri-strips off - let them fall off Staples: Staples may be removed before you leave the hospital If you go home with staples, call [facility] for an appointment with your surgeons nurse to have staples removed 10 days after surgery, [phone] Showering: You may shower two (2) days after your surgery unless your surgeon tells you differently Wash gently around incisions with warm soapy water, rinse well, and gently pat dry If you have a drain (tube from your incision), you may need someone to hold this while you shower No tub baths until staples are removed and incisions are healed   Medications: Medications should be liquid or crushed if larger than the size of a dime Extended release pills (medication that releases a little bit at a time through the  day) should not be crushed Depending on the size and number of medications you take, you may need to space (take a few throughout the day)/change the time you take your medications so that you do not over-fill your pouch (smaller stomach) Make sure you follow-up with you primary care physician to make medication changes needed during rapid weight loss and life -style changes If you have diabetes, follow up with your doctor that orders your diabetes medication(s) within one week after surgery and check your blood sugar regularly  Do not drive while taking narcotics (pain medications)  Do not take acetaminophen  (Tylenol) and Roxicet or Lortab Elixir at the same time since these pain medications contain acetaminophen   Diet:  First 2 Weeks You will see the nutritionist about two (2) weeks after your surgery. The nutritionist will increase the types of foods you can eat if you are handling liquids well: If you have severe vomiting or nausea and cannot handle clear liquids lasting longer than 1 day call your surgeon Protein Shake Drink at least 2 ounces of shake 5-6 times per day Each serving of protein shakes (usually 8-12 ounces) should have a minimum of: 15 grams of protein And no more than 5 grams of carbohydrate Goal for protein each day: Men = 80 grams per day Women = 60 grams per day    Protein powder may be added to fluids such as non-fat milk or Lactaid milk or Soy milk (limit to 35 grams added protein powder per serving)  Hydration Slowly increase the amount of water and other clear liquids as tolerated (See Acceptable Fluids) Slowly increase the amount of protein shake as tolerated Sip fluids slowly and throughout the day May use sugar substitutes in small amounts (no more than  6-8 packets per day; i.e. Splenda)  Fluid Goal The first goal is to drink at least 8 ounces of protein shake/drink per day (or as directed by the nutritionist); some examples of protein shakes are [employer], [employer], EAS Edge HP, and Unjury. - See handout from pre-op Bariatric Education Class: Slowly increase the amount of protein shake you drink as tolerated You may find it easier to slowly sip shakes throughout the day It is important to get your proteins in first Your fluid goal is to drink 64-100 ounces of fluid daily It may take a few weeks to build up to this  32 oz. (or more) should be clear liquids And 32 oz. (or more) should be full liquids (see below for examples) Liquids should not contain sugar, caffeine, or carbonation  Clear Liquids: Water of Sugar-free flavored water (i.e. Fruit  HO, Propel) Decaffeinated coffee or tea (sugar-free) Crystal lite, Wylers Lite, Minute Maid Lite Sugar-free Jell-O Bouillon or broth Sugar-free Popsicle:    - Less than 20 calories each; Limit 1 per day  Full Liquids:                   Protein Shakes/Drinks + 2 choices per day of other full liquids Full liquids must be: No More Than 12 grams of Carbs per serving No More Than 3 grams of Fat per serving Strained low-fat cream soup Non-Fat milk Fat-free Lactaid Milk Sugar-free yogurt (Dannon Lite & Fit, Greek yogurt)    Vitamins and Minerals Start 1 day after surgery unless otherwise directed by your surgeon 2 Chewable Multivitamin / Multimineral Supplement with iron (i.e. Centrum for Adults) Vitamin B-12, 350-500 micrograms sub-lingual (place tablet under the tongue) each day Chewable Calcium Citrate with Vitamin D-3 (Example: 3 Chewable Calcium  Plus 600 with Vitamin D-3) Take 500 mg three (3) times a day for a total of 1500 mg each day Do not take all 3 doses of calcium at one time as it may cause constipation, and you can only absorb 500 mg at a time Do not mix multivitamins containing iron with calcium supplements;  take 2 hours apart Do not substitute Tums (calcium carbonate) for your calcium Menstruating women and those at risk for anemia ( a blood disease that causes weakness) may need extra iron Talk to your doctor to see if you need more iron If you need extra iron: Total daily Iron recommendation (including Vitamins) is 50 to 100 mg Iron/day Do not stop taking or change any vitamins or minerals until you talk to your nutritionist or surgeon Your nutritionist and/or surgeon must approve all vitamin and mineral supplements   Activity and Exercise: It is important to continue walking at home. Limit your physical activity as instructed by your doctor. During this time, use these guidelines: Do not lift anything greater than ten  (10) pounds for at least two (2) weeks Do not  go back to work or drive until Engineer, production says you can You may have sex when you feel comfortable It is VERY important for male patients to use a reliable birth control method; fertility often increase after surgery Do not get pregnant for at least 18 months Start exercising as soon as your doctor tells you that you can Make sure your doctor approves any physical activity Start with a simple walking program Walk 5-15 minutes each day, 7 days per week Slowly increase until you are walking 30-45 minutes per day Consider joining our [place] program. [phone] or email [email or web address]  Special Instructions Things to remember: Free counseling is available for you and your family through collaboration between [facility] and [place]. Please call [phone] and leave a message Use your CPAP when sleeping if this applies to you Consider buying a medical alert bracelet that says you had lap-band surgery     You will likely have your first fill (fluid added to your band) 6 - 8 weeks after surgery [facility] has a free Bariatric Surgery Support Group that meets monthly, the 3rd Thursday, [place]. You can see classes online at [email or web address] It is very important to keep all follow up appointments with your surgeon, nutritionist, primary care physician, and behavioral health practitioner After the first year, please follow up with your bariatric surgeon and nutritionist at least once a year in order to maintain best weight loss results                    [facility]:  [place]: [phone]               Bariatric Nurse Coordinator: [phone]  Gastric Bypass/Sleeve Home Care Instructions  Rev. 01/2013                                                         Reviewed and Endorsed                                                    by [facility] Patient  Education Committee, Jan, 2014

## 2014-06-30 ENCOUNTER — Encounter (HOSPITAL_COMMUNITY): Payer: Self-pay | Admitting: Surgery

## 2014-06-30 NOTE — OR Nursing (Signed)
Update to reflect additional mesh added to the achart. 1x4 Bard Mesh per Rhetta Mura RN

## 2014-07-04 ENCOUNTER — Telehealth (HOSPITAL_COMMUNITY): Payer: Self-pay

## 2014-07-04 ENCOUNTER — Encounter: Payer: 59 | Attending: Surgery

## 2014-07-04 DIAGNOSIS — Z713 Dietary counseling and surveillance: Secondary | ICD-10-CM | POA: Insufficient documentation

## 2014-07-04 DIAGNOSIS — Z01818 Encounter for other preprocedural examination: Secondary | ICD-10-CM | POA: Insufficient documentation

## 2014-07-04 NOTE — Patient Instructions (Signed)
Patient to follow Phase 3A-Soft, High Protein Diet and follow-up at [facility] in 6 weeks for 2 months post-op nutrition visit for diet advancement. 

## 2014-07-04 NOTE — Progress Notes (Signed)
Bariatric Class:  Appt start time: 1530 end time:  1630.  2 Week Post-Operative Nutrition Class  Patient was seen on 07/04/2014 for Post-[facility]ative [facility].   Surgery date: 06/20/2014 Surgery type: LAGB Start weight at [facility]: 299 lbs on 03/30/2014 Weight today: 266.5 lbs  Weight change: 31 lbs  TANITA  BODY COMP RESULTS  05/22/2014 07/04/14   BMI (kg/m^2) 42.7 38.2   Fat Mass (lbs) 129 117.5   Fat Free Mass (lbs) 168.5 149.0   Total Body Water (lbs) 123.5 109.0    The following the learning objectives were met by the patient during this course:  Identifies Phase 3A (Soft, High Proteins) Dietary Goals and will begin from 2 weeks post-operatively to 2 months post-operatively  Identifies appropriate sources of fluids and proteins   States protein recommendations and appropriate sources post-operatively  Identifies the need for appropriate texture modifications, mastication, and bite sizes when consuming solids  Identifies appropriate multivitamin and calcium sources post-operatively  Describes the need for physical activity post-operatively and will follow MD recommendations  States when to call healthcare provider regarding medication questions or post-operative complications  Handouts given during class include:  Phase 3A: Soft, High Protein Diet Handout  Band Fill Guidelines Handout  Follow-Up Plan: Patient will follow-up at [facility] in 4 weeks for 6 week post-op nutrition visit for diet advancement per MD.

## 2014-07-04 NOTE — Telephone Encounter (Signed)
Attempted DROP discharge phone call, no answer, left message, no call back received  Made discharge phone call to patient per DROP protocol. Asking the following questions.    1. Do you have someone to care for you now that you are home?   2. Are you having pain now that is not relieved by your pain medication?   3. Are you able to drink the recommended daily amount of fluids (48 ounces minimum/day) and protein (60-80 grams/day) as prescribed by the dietitian or nutritional counselor?   4. Are you taking the vitamins and minerals as prescribed?   5. Do you have the "on call" number to contact your surgeon if you have a problem or question?   6. Are your incisions free of redness, swelling or drainage? (If steri strips, address that these can fall off, shower as tolerated)  7. Have your bowels moved since your surgery?  If not, are you passing gas?   8. Are you up and walking 3-4 times per day?      1. Do you have an appointment made to see your surgeon in the next month?   2. Were you provided your discharge medications before your surgery or before you were discharged from the hospital and are you taking them without problem?   3. Were you provided phone numbers to the clinic/surgeon's office?   4. Did you watch the patient education video module in the (clinic, surgeon's office, etc.) before your surgery?  5. Do you have a discharge checklist that was provided to you in the hospital to reference with instructions on how to take care of yourself after surgery?   6. Did you see a dietitian or nutritional counselor while you were in the hospital?   7. Do you have an appointment to see a dietitian or nutritional counselor in the next month?

## 2014-07-13 ENCOUNTER — Encounter (INDEPENDENT_AMBULATORY_CARE_PROVIDER_SITE_OTHER): Payer: Self-pay

## 2014-07-13 ENCOUNTER — Ambulatory Visit (INDEPENDENT_AMBULATORY_CARE_PROVIDER_SITE_OTHER): Payer: 59 | Admitting: Surgery

## 2014-07-13 VITALS — BP 124/82 | HR 47 | Ht 70.0 in | Wt 266.0 lb

## 2014-07-13 DIAGNOSIS — Z9884 Bariatric surgery status: Secondary | ICD-10-CM

## 2014-07-13 NOTE — Progress Notes (Signed)
Lapband Fill Encounter Problem List:   Patient Active Problem List   Diagnosis Date Noted  . S/P repair of ventral hernia 06/21/2014  . Lapband APS July 2015 06/20/2014  . Status post gastric banding 06/20/2014  . Morbid obesity BMI 42 01/04/2014  . Hypertension   . Renal disorder   . Gout   . PAF (paroxysmal atrial fibrillation)     Cindi [place] Body mass index is 38.17 kg/(m^2). Weight loss since surgery  29  Having regurgitation?:  no  Feel that they need a fill?  Not yet 6 weeks  Nocturnal reflux?  no  Amount of fill  0     Instructions given and weight loss goals discussed.    First post op visit.  Looks great.  Down 29 lbs.  Will see back in 3 weeks for first fill.   Matt B. Hassell Done, MD, FACS

## 2014-08-01 ENCOUNTER — Encounter: Payer: 59 | Attending: Surgery | Admitting: Dietician

## 2014-08-01 DIAGNOSIS — Z01818 Encounter for other preprocedural examination: Secondary | ICD-10-CM | POA: Insufficient documentation

## 2014-08-01 DIAGNOSIS — Z713 Dietary counseling and surveillance: Secondary | ICD-10-CM | POA: Insufficient documentation

## 2014-08-01 NOTE — Patient Instructions (Signed)
Goals:  Follow Phase 3B: High Protein + Non-Starchy Vegetables  Eat 3-6 small meals/snacks, every 3-5 hrs  Increase lean protein foods to meet 60g goal  Keep fluid to 32 oz per doctor's orders  Avoid drinking 15 minutes before, during and 30 minutes after eating  Aim for >30 min of physical activity daily  Resume calcium and multivitamins

## 2014-08-01 NOTE — Progress Notes (Signed)
Follow-up visit:  8 Weeks Post-Operative LAGB Surgery  Medical Nutrition Therapy:  Appt start time: [id number] end time:  1815.  Primary concerns today: Post-operative Bariatric Surgery Nutrition Management. Returns with a 22 lb fat loss. Feeling great! Tolerating most foods and had some problems tolerating foods if he doesn't chew well/eats too quickly.  Started back to work today. Has only one functioning kidney and trying to keep fluid to 32 oz per day. Try to lose weight to get kidney transplant. Also has a protein restriction but is not sure how much that is. Will find out next week next week when has appointment with kidney doctor.   Surgery date: 06/20/2014 Surgery type: LAGB Start weight at [facility]: 299 lbs on 03/30/2014 Weight today: 259.5 lbs  Weight change: 7 lbs, 22 lbs of fat loss Total weight loss: 39.5 lbs Goal weight: 220 lbs Goal achieved: 50%  TANITA  BODY COMP RESULTS  05/22/2014 07/04/14 08/01/14   BMI (kg/m^2) 42.7 38.2 37.2   Fat Mass (lbs) 129 117.5 95.5   Fat Free Mass (lbs) 168.5 149.0 164.0   Total Body Water (lbs) 123.5 109.0 120.0    Preferred Learning Style:   No preference indicated   Learning Readiness:   Ready  24-hr recall: B (AM): Premier or atkins shake (15-30 g) Snk (AM): scrambled egg (6g protein) L (PM): none Snk (PM): ham and cheese (14 g)  D (PM): 3 oz grilled chicken (21 g) Snk (PM): none  Fluid intake: 24 oz water + 11 oz protein shake and may have 16 unsweet tea (sometimes)  35-51 oz Estimated total protein intake: 56 - 71 g  Medications: see list Supplementation: stopped taking multivitamins and calcium   Using straws: Yes when gets tea Drinking while eating: No Hair loss: No Carbonated beverages: No N/V/D/C: sometimes will vomit if eats too quickly Dumping syndrome: None Last Lap-Band fill: has an appointment for 08/03/2014  Recent physical activity:  Walking 4 miles per day (about 45 minutes)  Progress Towards Goal(s):  In  progress.  Handouts given during visit include:  Phase 3B High Protein + Non Starchy Vegetables   Nutritional Diagnosis:  Parker-3.3 Overweight/obesity related to past poor dietary habits and physical inactivity as evidenced by patient w/ recent LAGB surgery following dietary guidelines for continued weight loss.    Intervention:  Nutrition education/diet advancment.  Teaching Method Utilized:  Visual Auditory Hands on  Barriers to learning/adherence to lifestyle change: none  Demonstrated degree of understanding via:  Teach Back   Monitoring/Evaluation:  Dietary intake, exercise, lap band fills, and body weight. Follow up in 1 months for 3 month post-op visit.

## 2014-08-03 ENCOUNTER — Encounter (INDEPENDENT_AMBULATORY_CARE_PROVIDER_SITE_OTHER): Payer: 59 | Admitting: Surgery

## 2014-08-11 ENCOUNTER — Other Ambulatory Visit: Payer: Self-pay | Admitting: *Deleted

## 2014-08-11 DIAGNOSIS — Z01810 Encounter for preprocedural cardiovascular examination: Secondary | ICD-10-CM

## 2014-08-11 DIAGNOSIS — N184 Chronic kidney disease, stage 4 (severe): Secondary | ICD-10-CM

## 2014-08-15 ENCOUNTER — Other Ambulatory Visit (HOSPITAL_COMMUNITY): Payer: 59

## 2014-08-15 ENCOUNTER — Encounter (HOSPITAL_COMMUNITY): Payer: 59

## 2014-08-15 ENCOUNTER — Ambulatory Visit: Payer: 59 | Admitting: Vascular Surgery

## 2014-09-15 ENCOUNTER — Encounter: Payer: Self-pay | Admitting: Surgery

## 2014-09-18 ENCOUNTER — Ambulatory Visit (INDEPENDENT_AMBULATORY_CARE_PROVIDER_SITE_OTHER)
Admission: RE | Admit: 2014-09-18 | Discharge: 2014-09-18 | Disposition: A | Discharge status: Home / Self Care | Payer: 59 | Source: Ambulatory Visit | Attending: Vascular Surgery | Admitting: Vascular Surgery

## 2014-09-18 ENCOUNTER — Ambulatory Visit (INDEPENDENT_AMBULATORY_CARE_PROVIDER_SITE_OTHER): Payer: 59 | Admitting: Surgery

## 2014-09-18 ENCOUNTER — Ambulatory Visit (HOSPITAL_COMMUNITY)
Admission: RE | Admit: 2014-09-18 | Discharge: 2014-09-18 | Disposition: A | Discharge status: Home / Self Care | Payer: 59 | Source: Ambulatory Visit | Attending: Surgery | Admitting: Surgery

## 2014-09-18 ENCOUNTER — Encounter: Payer: Self-pay | Admitting: Surgery

## 2014-09-18 ENCOUNTER — Ambulatory Visit: Payer: 59 | Admitting: Dietician

## 2014-09-18 VITALS — BP 138/82 | HR 46 | Temp 98.3°F | Resp 16 | Ht 71.0 in | Wt 259.0 lb

## 2014-09-18 DIAGNOSIS — N184 Chronic kidney disease, stage 4 (severe): Secondary | ICD-10-CM

## 2014-09-18 DIAGNOSIS — N185 Chronic kidney disease, stage 5: Secondary | ICD-10-CM

## 2014-09-18 DIAGNOSIS — Z01810 Encounter for preprocedural cardiovascular examination: Secondary | ICD-10-CM

## 2014-09-18 DIAGNOSIS — Z4931 Encounter for adequacy testing for hemodialysis: Secondary | ICD-10-CM | POA: Insufficient documentation

## 2014-09-18 HISTORY — DX: Chronic kidney disease, stage 5: N18.5

## 2014-09-18 HISTORY — DX: Encounter for adequacy testing for hemodialysis: Z49.31

## 2014-09-18 NOTE — Progress Notes (Signed)
Patient name: [name] [name] MRN: [id number] DOB: [birth year] Sex: male     Chief Complaint  Patient presents with  . Chronic Kidney Disease    Eval of HD access, (Had BVT Left arm 04-2011 never used)  Ref- Dr. Lorrene Reid   Patient is right handed.     HISTORY OF PRESENT ILLNESS: The patient comes in today for further evaluation.  He suffers from end-stage renal disease.  He is not yet on dialysis.  His renal failure secondary to hypertension.  He is right-handed.  Previously been 2012 I performed a left basilic vein transposition.  This failed prematurely.  A failed around a time he suffered the flu.  There is no underlying explanation.  At the time of his surgery I explored the cephalic vein and this was found to be occluded.  He is here today for new access.  He has recently undergone a lap band procedure and has lost 50 pounds since July.  He continues to take a stat for high cholesterol  Past Medical History  Diagnosis Date  . Gout   . PAF (paroxysmal atrial fibrillation)   . Hypertension   . Dysrhythmia     EPISODES OF A FIB "EVERY FEW YRS" WAS GIVEN METOPROLOL TO HELP CONTROL - NO PROBLEM FOR PAST YEAR - FOLLOWED BY DR.L NISHAN  . Hyperlipidemia   . Arthritis   . Renal insufficiency     DIDNEY DISEASE - FOLLOWED BY DR. Lorrene Reid NOTES ON CHART    Past Surgical History  Procedure Laterality Date  . Knee surgery  2001  . Av fistula placement      "IT CLOSED UP AND DOES NOT WORK"  . Cataracts removed    . Laparoscopic gastric banding N/A 06/20/2014    Procedure: LAPAROSCOPIC GASTRIC BANDING and VENTRAL HERNIA REPAIR WITH MESH;  Surgeon: Pedro Earls, MD;  Location: [facility];  Service: General;  Laterality: N/A;  . Insertion of mesh N/A 06/20/2014    Procedure: INSERTION OF MESH;  Surgeon: Pedro Earls, MD;  Location: [facility];  Service: General;  Laterality: N/A;    History   Social History  . Marital Status: Married    Spouse Name: N/A    Number of Children: N/A  .  Years of Education: N/A   Occupational History  . Not on file.   Social History Main Topics  . Smoking status: Never Smoker   . Smokeless tobacco: Never Used  . Alcohol Use: No  . Drug Use: No  . Sexual Activity: Not on file   Other Topics Concern  . Not on file   Social History Narrative  . No narrative on file    Family History  Problem Relation Age of Onset  . CAD    . Hypertension Mother   . Cancer Father     bone  . Cancer Sister     breast  . Cancer Sister     breast    Allergies as of 09/18/2014  . (No Known Allergies)    Current Outpatient Prescriptions on File Prior to Visit  Medication Sig Dispense Refill  . amLODipine (NORVASC) 10 MG tablet Take 10 mg by mouth every morning.       Marland Kitchen aspirin EC 81 MG tablet Take 81 mg by mouth daily.      Marland Kitchen atorvastatin (LIPITOR) 10 MG tablet Take 10 mg by mouth every morning.       . Febuxostat (ULORIC) 80 MG TABS Take  80 mg by mouth daily.      . furosemide (LASIX) 40 MG tablet Take 40 mg by mouth daily.      . metoprolol (LOPRESSOR) 50 MG tablet Take 50 mg by mouth every morning.      . Vitamin D, Ergocalciferol, (DRISDOL) 50000 UNITS CAPS capsule Take 50,000 Units by mouth every 7 (seven) days. Wednesday      . fenofibrate 160 MG tablet Take 160 mg by mouth daily.       No current facility-administered medications on file prior to visit.     REVIEW OF SYSTEMS: Cardiovascular: No chest pain, chest pressure, palpitations, orthopnea, or dyspnea on exertion. No claudication or rest pain,  No history of DVT or phlebitis. Pulmonary: No productive cough, asthma or wheezing. Neurologic: No weakness, paresthesias, aphasia, or amaurosis. No dizziness. Hematologic: No bleeding problems or clotting disorders. Musculoskeletal: No joint pain or joint swelling. Gastrointestinal: No blood in stool or hematemesis Genitourinary: No dysuria or hematuria. Psychiatric:: No history of major depression. Integumentary: No rashes or  ulcers. Constitutional: No fever or chills.  PHYSICAL EXAMINATION:   Vital signs are BP 138/82  Pulse 46  Temp(Src) 98.3 F (36.8 C) (Oral)  Resp 16  Ht 5\' 11"  (1.803 m)  Wt 259 lb (117.482 kg)  BMI 36.14 kg/m2  SpO2 98% General: The patient appears their stated age. HEENT:  No gross abnormalities Pulmonary:  Non labored breathing Musculoskeletal: There are no major deformities. Neurologic: No focal weakness or paresthesias are detected, Skin: There are no ulcer or rashes noted. Psychiatric: The patient has normal affect. Cardiovascular: There is a regular rate and rhythm without significant murmur appreciated.  Palpable right radial pulse/   Diagnostic Studies I have reviewed his vein mapping.  This shows an adequate right cephalic vein.  He does have an abnormal Allen's test bilaterally  Assessment: Stage IV renal failure Plan: The patient will be scheduled for a right radiocephalic fistula.  I discussed the risks and benefits of the procedure, especially the risk of steal given his abnormal Allen test.  We also discussed the need for future interventions for non-maturity.  His operation is been scheduled for Friday, November 29  V. Leia Alf, M.D. [facility] Office: [phone] Pager:  [phone]

## 2014-09-19 ENCOUNTER — Other Ambulatory Visit: Payer: Self-pay

## 2014-10-10 ENCOUNTER — Encounter: Payer: Self-pay | Admitting: Gastroenterology

## 2014-10-25 ENCOUNTER — Encounter (HOSPITAL_COMMUNITY): Payer: Self-pay | Admitting: *Deleted

## 2014-10-25 NOTE — Progress Notes (Signed)
   10/25/14 [place]  Have you ever been diagnosed with sleep apnea through a sleep study? No  Do you snore loudly (loud enough to be heard through closed doors)?  0  Do you often feel tired, fatigued, or sleepy during the daytime? 0  Has anyone observed you stop breathing during your sleep? 0  Do you have, or are you being treated for high blood pressure? 1  BMI more than 35 kg/m2? 1  Age over 55 years old? 1  Gender: 1  Obstructive Sleep Apnea Score 4

## 2014-10-26 MED ORDER — SODIUM CHLORIDE 0.9 % IV SOLN: INTRAVENOUS | Status: DC

## 2014-10-26 MED ORDER — DEXTROSE 5 % IV SOLN
1.5000 g | INTRAVENOUS | Status: AC
  Administered 2014-10-27: 1.5 g via INTRAVENOUS
  Filled 2014-10-26: qty 1.5

## 2014-10-27 ENCOUNTER — Encounter (HOSPITAL_COMMUNITY)
Admission: RE | Disposition: A | Discharge status: Home / Self Care | Payer: Self-pay | Source: Ambulatory Visit | Attending: Surgery

## 2014-10-27 ENCOUNTER — Ambulatory Visit (HOSPITAL_COMMUNITY): Payer: 59 | Admitting: Certified Registered Nurse Anesthetist

## 2014-10-27 ENCOUNTER — Encounter (HOSPITAL_COMMUNITY): Payer: Self-pay | Admitting: *Deleted

## 2014-10-27 ENCOUNTER — Ambulatory Visit (HOSPITAL_COMMUNITY)
Admission: RE | Admit: 2014-10-27 | Discharge: 2014-10-27 | Disposition: A | Discharge status: Home / Self Care | Payer: 59 | Source: Ambulatory Visit | Attending: Vascular Surgery | Admitting: Vascular Surgery

## 2014-10-27 DIAGNOSIS — Z8249 Family history of ischemic heart disease and other diseases of the circulatory system: Secondary | ICD-10-CM | POA: Insufficient documentation

## 2014-10-27 DIAGNOSIS — Z808 Family history of malignant neoplasm of other organs or systems: Secondary | ICD-10-CM | POA: Insufficient documentation

## 2014-10-27 DIAGNOSIS — N186 End stage renal disease: Secondary | ICD-10-CM | POA: Diagnosis not present

## 2014-10-27 DIAGNOSIS — I120 Hypertensive chronic kidney disease with stage 5 chronic kidney disease or end stage renal disease: Secondary | ICD-10-CM | POA: Insufficient documentation

## 2014-10-27 DIAGNOSIS — E785 Hyperlipidemia, unspecified: Secondary | ICD-10-CM | POA: Diagnosis not present

## 2014-10-27 DIAGNOSIS — Z803 Family history of malignant neoplasm of breast: Secondary | ICD-10-CM | POA: Diagnosis not present

## 2014-10-27 DIAGNOSIS — I480 Paroxysmal atrial fibrillation: Secondary | ICD-10-CM | POA: Diagnosis not present

## 2014-10-27 DIAGNOSIS — I4891 Unspecified atrial fibrillation: Secondary | ICD-10-CM | POA: Insufficient documentation

## 2014-10-27 DIAGNOSIS — Z6837 Body mass index (BMI) 37.0-37.9, adult: Secondary | ICD-10-CM | POA: Diagnosis not present

## 2014-10-27 DIAGNOSIS — M109 Gout, unspecified: Secondary | ICD-10-CM | POA: Diagnosis not present

## 2014-10-27 DIAGNOSIS — N184 Chronic kidney disease, stage 4 (severe): Secondary | ICD-10-CM

## 2014-10-27 HISTORY — PX: AV FISTULA PLACEMENT: SHX1204

## 2014-10-27 LAB — POCT I-STAT 4, (NA,K, GLUC, HGB,HCT)
Glucose, Bld: 114 mg/dL — ABNORMAL HIGH (ref 70–99)
HCT: 41 % (ref 39.0–52.0)
Hemoglobin: 13.9 g/dL (ref 13.0–17.0)
POTASSIUM: 3.6 meq/L — AB (ref 3.7–5.3)
Sodium: 142 mEq/L (ref 137–147)

## 2014-10-27 SURGERY — ARTERIOVENOUS (AV) FISTULA CREATION
Anesthesia: Monitor Anesthesia Care | Site: Arm Lower | Laterality: Right

## 2014-10-27 MED ORDER — ROCURONIUM BROMIDE 50 MG/5ML IV SOLN
INTRAVENOUS | Status: AC
  Filled 2014-10-27: qty 1

## 2014-10-27 MED ORDER — CHLORHEXIDINE GLUCONATE CLOTH 2 % EX PADS: 6.0000 | MEDICATED_PAD | Freq: Once | CUTANEOUS | Status: DC

## 2014-10-27 MED ORDER — MIDAZOLAM HCL 2 MG/2ML IJ SOLN
INTRAMUSCULAR | Status: AC
  Filled 2014-10-27: qty 2

## 2014-10-27 MED ORDER — SODIUM CHLORIDE 0.9 % IV SOLN
INTRAVENOUS | Status: DC | PRN
  Administered 2014-10-27: 08:00:00 via INTRAVENOUS

## 2014-10-27 MED ORDER — BUPIVACAINE HCL (PF) 0.5 % IJ SOLN
INTRAMUSCULAR | Status: DC | PRN
  Administered 2014-10-27: 30 mL

## 2014-10-27 MED ORDER — 0.9 % SODIUM CHLORIDE (POUR BTL) OPTIME
TOPICAL | Status: DC | PRN
  Administered 2014-10-27: 1000 mL

## 2014-10-27 MED ORDER — SUCCINYLCHOLINE CHLORIDE 20 MG/ML IJ SOLN
INTRAMUSCULAR | Status: AC
  Filled 2014-10-27: qty 1

## 2014-10-27 MED ORDER — PROPOFOL 10 MG/ML IV BOLUS
INTRAVENOUS | Status: AC
  Filled 2014-10-27: qty 20

## 2014-10-27 MED ORDER — OXYCODONE HCL 5 MG PO TABS: 5.0000 mg | ORAL_TABLET | Freq: Once | ORAL | Status: DC | PRN

## 2014-10-27 MED ORDER — OXYCODONE-ACETAMINOPHEN 5-325 MG PO TABS: 1.0000 | ORAL_TABLET | Freq: Four times a day (QID) | ORAL | Status: DC | PRN

## 2014-10-27 MED ORDER — PROMETHAZINE HCL 25 MG/ML IJ SOLN: 6.2500 mg | INTRAMUSCULAR | Status: DC | PRN

## 2014-10-27 MED ORDER — OXYCODONE HCL 5 MG/5ML PO SOLN: 5.0000 mg | Freq: Once | ORAL | Status: DC | PRN

## 2014-10-27 MED ORDER — ONDANSETRON HCL 4 MG/2ML IJ SOLN
INTRAMUSCULAR | Status: DC | PRN
  Administered 2014-10-27: 4 mg via INTRAVENOUS

## 2014-10-27 MED ORDER — ONDANSETRON HCL 4 MG/2ML IJ SOLN
INTRAMUSCULAR | Status: AC
  Filled 2014-10-27: qty 2

## 2014-10-27 MED ORDER — LIDOCAINE HCL (CARDIAC) 20 MG/ML IV SOLN
INTRAVENOUS | Status: DC | PRN
  Administered 2014-10-27: 100 mg via INTRAVENOUS

## 2014-10-27 MED ORDER — LIDOCAINE-EPINEPHRINE (PF) 1 %-1:200000 IJ SOLN
INTRAMUSCULAR | Status: DC | PRN
  Administered 2014-10-27: 30 mL

## 2014-10-27 MED ORDER — FENTANYL CITRATE 0.05 MG/ML IJ SOLN
INTRAMUSCULAR | Status: AC
  Filled 2014-10-27: qty 5

## 2014-10-27 MED ORDER — FENTANYL CITRATE 0.05 MG/ML IJ SOLN
INTRAMUSCULAR | Status: DC | PRN
  Administered 2014-10-27 (×2): 25 ug via INTRAVENOUS

## 2014-10-27 MED ORDER — LIDOCAINE-EPINEPHRINE (PF) 1 %-1:200000 IJ SOLN
INTRAMUSCULAR | Status: AC
  Filled 2014-10-27: qty 10

## 2014-10-27 MED ORDER — MIDAZOLAM HCL 5 MG/5ML IJ SOLN
INTRAMUSCULAR | Status: DC | PRN
  Administered 2014-10-27 (×2): 1 mg via INTRAVENOUS

## 2014-10-27 MED ORDER — HYDROMORPHONE HCL 1 MG/ML IJ SOLN: 0.2500 mg | INTRAMUSCULAR | Status: DC | PRN

## 2014-10-27 MED ORDER — PROPOFOL 10 MG/ML IV BOLUS
INTRAVENOUS | Status: DC | PRN
  Administered 2014-10-27: 20 mg via INTRAVENOUS
  Administered 2014-10-27: 10 mg via INTRAVENOUS

## 2014-10-27 MED ORDER — SODIUM CHLORIDE 0.9 % IR SOLN
Status: DC | PRN
  Administered 2014-10-27: 07:00:00

## 2014-10-27 MED ORDER — ARTIFICIAL TEARS OP OINT
TOPICAL_OINTMENT | OPHTHALMIC | Status: AC
  Filled 2014-10-27: qty 3.5

## 2014-10-27 MED ORDER — LIDOCAINE HCL (CARDIAC) 20 MG/ML IV SOLN
INTRAVENOUS | Status: AC
  Filled 2014-10-27: qty 5

## 2014-10-27 MED ORDER — PROPOFOL INFUSION 10 MG/ML OPTIME
INTRAVENOUS | Status: DC | PRN
  Administered 2014-10-27: 45 ug/kg/min via INTRAVENOUS

## 2014-10-27 SURGICAL SUPPLY — 37 items
ARMBAND PINK RESTRICT EXTREMIT (MISCELLANEOUS) ×2 IMPLANT
BLADE SURG 10 STRL SS (BLADE) ×2 IMPLANT
CANISTER SUCTION 2500CC (MISCELLANEOUS) ×2 IMPLANT
CLIP TI MEDIUM 6 (CLIP) ×2 IMPLANT
CLIP TI WIDE RED SMALL 6 (CLIP) ×2 IMPLANT
COVER PROBE W GEL 5X96 (DRAPES) ×2 IMPLANT
COVER SURGICAL LIGHT HANDLE (MISCELLANEOUS) ×2 IMPLANT
DECANTER SPIKE VIAL GLASS SM (MISCELLANEOUS) ×2 IMPLANT
DRAIN PENROSE 1/4X12 LTX STRL (WOUND CARE) ×2 IMPLANT
ELECT REM PT RETURN 9FT ADLT (ELECTROSURGICAL) ×2
ELECTRODE REM PT RTRN 9FT ADLT (ELECTROSURGICAL) ×1 IMPLANT
GLOVE BIO SURGEON STRL SZ 6.5 (GLOVE) ×2 IMPLANT
GLOVE BIO SURGEON STRL SZ7 (GLOVE) ×2 IMPLANT
GLOVE BIOGEL PI IND STRL 6.5 (GLOVE) ×1 IMPLANT
GLOVE BIOGEL PI IND STRL 7.5 (GLOVE) ×1 IMPLANT
GLOVE BIOGEL PI INDICATOR 6.5 (GLOVE) ×1
GLOVE BIOGEL PI INDICATOR 7.5 (GLOVE) ×1
GLOVE SURG SS PI 7.0 STRL IVOR (GLOVE) ×4 IMPLANT
GOWN STRL REUS W/ TWL LRG LVL3 (GOWN DISPOSABLE) ×3 IMPLANT
GOWN STRL REUS W/ TWL XL LVL3 (GOWN DISPOSABLE) ×1 IMPLANT
GOWN STRL REUS W/TWL LRG LVL3 (GOWN DISPOSABLE) ×3
GOWN STRL REUS W/TWL XL LVL3 (GOWN DISPOSABLE) ×1
KIT BASIN OR (CUSTOM PROCEDURE TRAY) ×2 IMPLANT
KIT ROOM TURNOVER OR (KITS) ×2 IMPLANT
LIQUID BAND (GAUZE/BANDAGES/DRESSINGS) ×2 IMPLANT
NS IRRIG 1000ML POUR BTL (IV SOLUTION) ×2 IMPLANT
PACK CV ACCESS (CUSTOM PROCEDURE TRAY) ×2 IMPLANT
PAD ARMBOARD 7.5X6 YLW CONV (MISCELLANEOUS) ×4 IMPLANT
PROBE PENCIL 8 MHZ STRL DISP (MISCELLANEOUS) ×2 IMPLANT
SPONGE SURGIFOAM ABS GEL 100 (HEMOSTASIS) IMPLANT
SUT MNCRL AB 4-0 PS2 18 (SUTURE) ×2 IMPLANT
SUT PROLENE 6 0 BV (SUTURE) ×2 IMPLANT
SUT PROLENE 7 0 BV 1 (SUTURE) ×2 IMPLANT
SUT VIC AB 3-0 SH 27 (SUTURE) ×1
SUT VIC AB 3-0 SH 27X BRD (SUTURE) ×1 IMPLANT
UNDERPAD 30X30 INCONTINENT (UNDERPADS AND DIAPERS) ×2 IMPLANT
WATER STERILE IRR 1000ML POUR (IV SOLUTION) ×2 IMPLANT

## 2014-10-27 NOTE — H&P (Signed)
Brief History and Physical  History of Present Illness  [name] [name] is a 53 y.o. male who presents with chief complaint: ESRD.  The patient presents today for R RC AVF placement.    Past Medical History  Diagnosis Date  . Gout   . PAF (paroxysmal atrial fibrillation)   . Hypertension   . Dysrhythmia     EPISODES OF A FIB "EVERY FEW YRS" WAS GIVEN METOPROLOL TO HELP CONTROL - NO PROBLEM FOR PAST YEAR - FOLLOWED BY DR.L NISHAN  . Hyperlipidemia   . Arthritis   . Renal insufficiency     DIDNEY DISEASE - FOLLOWED BY DR. Lorrene Reid NOTES ON CHART    Past Surgical History  Procedure Laterality Date  . Knee surgery  2001  . Av fistula placement      "IT CLOSED UP AND DOES NOT WORK"  . Cataracts removed    . Laparoscopic gastric banding N/A 06/20/2014    Procedure: LAPAROSCOPIC GASTRIC BANDING and VENTRAL HERNIA REPAIR WITH MESH;  Surgeon: Pedro Earls, MD;  Location: [facility];  Service: General;  Laterality: N/A;  . Insertion of mesh N/A 06/20/2014    Procedure: INSERTION OF MESH;  Surgeon: Pedro Earls, MD;  Location: [facility];  Service: General;  Laterality: N/A;  . Hernia repair      History   Social History  . Marital Status: Married    Spouse Name: N/A    Number of Children: N/A  . Years of Education: N/A   Occupational History  . Not on file.   Social History Main Topics  . Smoking status: Never Smoker   . Smokeless tobacco: Never Used  . Alcohol Use: No  . Drug Use: No  . Sexual Activity: Not on file   Other Topics Concern  . Not on file   Social History Narrative    Family History  Problem Relation Age of Onset  . CAD    . Hypertension Mother   . Cancer Father     bone  . Cancer Sister     breast  . Cancer Sister     breast    No current facility-administered medications on file prior to encounter.   Current Outpatient Prescriptions on File Prior to Encounter  Medication Sig Dispense Refill  . amLODipine (NORVASC) 10 MG tablet Take  10 mg by mouth every morning.     Marland Kitchen aspirin EC 81 MG tablet Take 81 mg by mouth daily.    Marland Kitchen atorvastatin (LIPITOR) 10 MG tablet Take 10 mg by mouth every morning.     . Febuxostat (ULORIC) 80 MG TABS Take 80 mg by mouth daily.    . furosemide (LASIX) 40 MG tablet Take 40 mg by mouth daily.    . metoprolol (LOPRESSOR) 50 MG tablet Take 25 mg by mouth every morning.     . Vitamin D, Ergocalciferol, (DRISDOL) 50000 UNITS CAPS capsule Take 50,000 Units by mouth every 7 (seven) days. Wednesday      No Known Allergies  Review of Systems: As listed above, otherwise negative.  Physical Examination  Filed Vitals:   10/27/14 0600  BP: 125/92  Pulse: 52  Temp: 98.6 F (37 C)  TempSrc: Oral  Resp: 16  Height: 5\' 10"  (1.778 m)  Weight: 260 lb (117.935 kg)  SpO2: 100%    General: A&O x 3, WDWN  Pulmonary: Sym exp, good air movt, CTAB, no rales, rhonchi, & wheezing  Cardiac: RRR, Nl S1, S2, no  Murmurs, rubs or gallops  Gastrointestinal: soft, NTND, -G/R, - HSM, - masses, - CVAT B  Musculoskeletal: M/S 5/5 throughout , Extremities without ischemic changes   Laboratory [name] is a 53 y.o. male who presents with: ESRD.   The patient is scheduled for: R RC AVF  Risk, benefits, and alternatives to access surgery were discussed.  The patient is aware the risks include but are not limited to: bleeding, infection, steal syndrome, nerve damage, ischemic monomelic neuropathy, failure to mature, and need for additional procedures.  The patient is aware of the risks and agrees to proceed.  Adele Barthel, MD [facility] [place] Office: [phone] Pager: [phone]  10/27/2014, 7:15 AM

## 2014-10-27 NOTE — Transfer of Care (Signed)
Immediate Anesthesia Transfer of Care Note  Patient: [name] [name]  Procedure(s) Performed: Procedure(s): ARTERIOVENOUS (AV) FISTULA CREATION (Right)  Patient Location: PACU  Anesthesia Type:MAC  Level of Consciousness: awake, alert , oriented and patient cooperative  Airway & Oxygen Therapy: Patient Spontanous Breathing  Post-op Assessment: Report given to PACU RN, Post -op Vital signs reviewed and stable and Patient moving all extremities  Post vital signs: Reviewed and stable  Complications: No apparent anesthesia complications

## 2014-10-27 NOTE — Anesthesia Postprocedure Evaluation (Signed)
  Anesthesia Post-op Note  Patient: [name] [name]  Procedure(s) Performed: Procedure(s): ARTERIOVENOUS (AV) FISTULA CREATION (Right)  Patient Location: PACU  Anesthesia Type:MAC  Level of Consciousness: awake and alert   Airway and Oxygen Therapy: Patient Spontanous Breathing  Post-op Pain: mild  Post-op Assessment: Post-op Vital signs reviewed  Post-op Vital Signs: stable  Last Vitals:  Filed Vitals:   10/27/14 0955  BP: 116/80  Pulse: 49  Temp: 36.7 C  Resp: 15    Complications: No apparent anesthesia complications

## 2014-10-27 NOTE — Anesthesia Preprocedure Evaluation (Addendum)
Anesthesia Evaluation  Patient identified by MRN, date of birth, ID band Patient awake    Reviewed: Allergy & Precautions, H&P , NPO status , Patient's Chart, lab work & pertinent test results  Airway Mallampati: III  TM Distance: >3 FB Neck ROM: Full    Dental  (+) Teeth Intact, Dental Advisory Given   Pulmonary neg pulmonary ROS,  breath sounds clear to auscultation  Pulmonary exam normal       Cardiovascular hypertension, Pt. on medications and Pt. on home beta blockers + dysrhythmias Atrial Fibrillation Rhythm:Regular Rate:Normal     Neuro/Psych negative neurological ROS  negative psych ROS   GI/Hepatic negative GI ROS, Neg liver ROS,   Endo/Other  Morbid obesity  Renal/GU ESRF     Musculoskeletal   Abdominal (+) + obese,   Peds  Hematology negative hematology ROS (+)   Anesthesia Other Findings   Reproductive/Obstetrics                            Anesthesia Physical Anesthesia Plan  ASA: III  Anesthesia Plan: MAC   Post-op Pain Management:    Induction: Intravenous  Airway Management Planned:   Additional Equipment:   Intra-op Plan:   Post-operative Plan:   Informed Consent: I have reviewed the patients History and Physical, chart, labs and discussed the procedure including the risks, benefits and alternatives for the proposed anesthesia with the patient or authorized representative who has indicated his/her understanding and acceptance.     Plan Discussed with:   Anesthesia Plan Comments:        Anesthesia Quick Evaluation

## 2014-10-27 NOTE — Addendum Note (Signed)
Addendum  created 10/27/14 1101 by Rogers Blocker, CRNA   Modules edited: Anesthesia Flowsheet

## 2014-10-27 NOTE — Op Note (Signed)
OPERATIVE NOTE   PROCEDURE: right radiocephaic arteriovenous fistula placement  PRE-OPERATIVE DIAGNOSIS: chronic kidney disease stage IV   POST-OPERATIVE DIAGNOSIS: same as above   SURGEON: Quantavis Obryant LIANG-YU, MD  ANESTHESIA: local and MAC  ESTIMATED BLOOD LOSS: 50 cc  FINDING(S): 1.  Cephalic vein 123XX123 mm distally 2.  Radial artery 2.5 mm 3.  Weak thrill at end of case 4.  Dopperable radial and ulnar signals  SPECIMEN(S):  none  INDICATIONS:   [name] [name] is a 53 y.o. male who presents with chronic kidney disease stage IV.  Pt had previous failed a left basilic vein transposition.  The patient is scheduled for right radiocephalic arteriovenous fistula placement.  The patient is aware the risks include but are not limited to: bleeding, infection, steal syndrome, nerve damage, ischemic monomelic neuropathy, failure to mature, and need for additional procedures.  The patient is aware of the risks of the procedure and elects to proceed forward.  DESCRIPTION: After full informed written consent was obtained from the patient, the patient was brought back to the operating room and placed supine upon the operating table.  Prior to induction, the patient received IV antibiotics.   After obtaining adequate anesthesia, the patient was then prepped and draped in the standard fashion for a right arm access procedure.  I turned my attention first to identifying the patient's distal cephalic vein and radial artery.  Using SonoSite guidance, the location of these vessels were marked out on the skin.   At this point, I injected local anesthetic to obtain a field block of the wrist.  In total, I injected about 10 mL of a 1:1 mixture of 0.5% Marcaine without epinephrine and 1% lidocaine with epinephrine.  I made a longitudinal incision over the distal cephalic vein at the level of the wrist and dissected through the subcutaneous tissue and fascia to gain exposure of the vein.  The vein  appeared to >3.0 mm more proximally but distally was 2.5-3.0 mm.  I then made an incision over the radial artery and dissected out the artery with electrocautery and blunt dissection.  This was noted to be 2.5 mm in diameter externally.  This was dissected out proximally and distally and controlled with vessel loops .  I The distal segment of the vein was ligated with a  2-0 silk, and the vein was transected.  The proximal segment was interrogated with serial dilators.  The vein accepted up to a 3 mm dilator without any difficulty.  I then instilled the heparinized saline into the vein and clamped it.  I dissected a subcutaneous tunnel from the radial artery to the vein and delivered vein to the radial artery exposure, taking care to maintain orientation of the fistula.  At this point, I reset my exposure of the radial artery and clamped the artery under tension proximally and distally.  I made an arteriotomy with a #11 blade, and then I extended the arteriotomy with a Potts scissor.  I injected heparinized saline proximal and distal to this arteriotomy.  The vein was then sewn to the artery in an end-to-side configuration with a running stitch of 7-0 Prolene.  Prior to completing this anastomosis, I allowed the vein and artery to backbleed.  There was no evidence of clot from any vessels.  I completed the anastomosis in the usual fashion and then released all vessel loops and clamps.  There was a faintly palpable  thrill in the venous outflow, and there was a dopplerable radial  and ulnar signal.  At this point, I irrigated out the surgical wound.  There was no further active bleeding.  The subcutaneous tissue was reapproximated with a running stitch of 3-0 Vicryl.  The skin was then reapproximated with a running subcuticular stitch of 4-0 Vicryl.  The skin was then cleaned, dried, and reinforced with Dermabond.  The patient tolerated this procedure well.   COMPLICATIONS: none  CONDITION: stable   Hinda Lenis, MD 10/27/2014 9:18 AM

## 2014-10-30 ENCOUNTER — Telehealth: Payer: Self-pay | Admitting: Vascular Surgery

## 2014-10-30 ENCOUNTER — Encounter (HOSPITAL_COMMUNITY): Payer: Self-pay | Admitting: Vascular Surgery

## 2014-10-30 NOTE — Telephone Encounter (Addendum)
-----   Message from Mena Goes, RN sent at 10/30/2014  9:55 AM EST ----- Regarding: Schedule   ----- Message -----    From: Conrad [place]in[place], MD    Sent: 10/27/2014   9:23 AM      To: [facility] [address]  [name] [name] [id number] Apr 17, 1961  Procedure: R RC AVF   Follow-up: 6 weeks    10/30/14: spoke with pts wife to schedule, dpm

## 2014-10-31 ENCOUNTER — Ambulatory Visit: Payer: 59 | Admitting: Dietician

## 2014-11-13 DIAGNOSIS — I150 Renovascular hypertension: Secondary | ICD-10-CM | POA: Insufficient documentation

## 2014-11-13 DIAGNOSIS — E782 Mixed hyperlipidemia: Secondary | ICD-10-CM | POA: Insufficient documentation

## 2014-11-13 HISTORY — DX: Renovascular hypertension: I15.0

## 2014-11-13 HISTORY — DX: Mixed hyperlipidemia: E78.2

## 2014-11-15 ENCOUNTER — Ambulatory Visit (AMBULATORY_SURGERY_CENTER): Payer: Self-pay | Admitting: *Deleted

## 2014-11-15 VITALS — Ht 70.0 in | Wt 260.4 lb

## 2014-11-15 DIAGNOSIS — Z1211 Encounter for screening for malignant neoplasm of colon: Secondary | ICD-10-CM

## 2014-11-15 MED ORDER — MOVIPREP 100 G PO SOLR: 1.0000 | Freq: Once | ORAL | Status: DC

## 2014-11-15 NOTE — Progress Notes (Signed)
No egg or soy allergy. ewm No blood thinners, no diet pills. ewm No issues with past sedation. ewm No home 02 use. ewm Pt doing colon for potential kidney transplant, stage 5 kidney dx, right fistula but no dialysis yet. ewm Pt has fluid restrictions and will check with his kidney doc about fluid intake for this colon.  Pt will talk to doc and call me if issues. ewm Pt wants report faxed to [facility] fax [phone]

## 2014-11-16 ENCOUNTER — Telehealth: Payer: Self-pay | Admitting: *Deleted

## 2014-11-16 NOTE — Telephone Encounter (Addendum)
Dr Fuller Plan, I saw this Pt yesterday in pre visit. He has chronic kidney disease, has recently 11-27 had a fistula placed and is waiting to be placed on the transplant list. He does not take any blood thinners. He has not started dialysis yet.  He does require a fluid restriction and was going to discuss this with his doctor and call back and notify us if his prep instructions were going to be an issue. Due to his chronic kidney disease, i just wanted to be sure it was ok for him to be done in the [facility].  He needs to have colon performed to be placed on the transplant list. He also has a history of a fib that occurs every 6 years he states.   Please advise   Thanks for your time.  Lelan Pons PV

## 2014-11-16 NOTE — Telephone Encounter (Signed)
CKD is ok for the [place]. Renal patients are fine with MoviPrep. He can check with his nephrologist if he would like to but it is not necessary to check on MoviPrep.

## 2014-11-17 NOTE — Telephone Encounter (Signed)
Pt notified per Gretchen Portela RN that ok to do procedure at [facility] and to use Moviprep

## 2014-11-29 ENCOUNTER — Encounter: Payer: Self-pay | Admitting: Gastroenterology

## 2014-11-29 ENCOUNTER — Ambulatory Visit (AMBULATORY_SURGERY_CENTER): Payer: 59 | Admitting: Gastroenterology

## 2014-11-29 VITALS — BP 156/94 | HR 48 | Temp 96.6°F | Resp 30 | Ht 70.0 in | Wt 260.0 lb

## 2014-11-29 DIAGNOSIS — D129 Benign neoplasm of anus and anal canal: Secondary | ICD-10-CM

## 2014-11-29 DIAGNOSIS — D123 Benign neoplasm of transverse colon: Secondary | ICD-10-CM

## 2014-11-29 DIAGNOSIS — D122 Benign neoplasm of ascending colon: Secondary | ICD-10-CM

## 2014-11-29 DIAGNOSIS — Z1211 Encounter for screening for malignant neoplasm of colon: Secondary | ICD-10-CM

## 2014-11-29 DIAGNOSIS — K621 Rectal polyp: Secondary | ICD-10-CM

## 2014-11-29 DIAGNOSIS — D128 Benign neoplasm of rectum: Secondary | ICD-10-CM

## 2014-11-29 MED ORDER — SODIUM CHLORIDE 0.9 % IV SOLN: 500.0000 mL | INTRAVENOUS | Status: DC

## 2014-11-29 NOTE — Progress Notes (Signed)
Called to room to assist during endoscopic procedure.  Patient ID and intended procedure confirmed with present staff. Received instructions for my participation in the procedure from the performing physician.  

## 2014-11-29 NOTE — Op Note (Signed)
[place]  [address] [place], [address]   COLONOSCOPY PROCEDURE REPORT PATIENT: [name] [name], [name] [name]  MR#: [id number] BIRTHDATE: 15-Aug-1961 , 28  yrs. old GENDER: male ENDOSCOPIST: Ladene Artist, MD, Marval Regal REFERRED BY: Salomon Mast, MD PROCEDURE DATE:  11/29/2014 PROCEDURE:   Colonoscopy with hot biopsy/bipolar and Colonoscopy with snare polypectomy First Screening Colonoscopy - Avg.  risk and is 50 yrs.  old or older Yes.  Prior Negative Screening - Now for repeat screening. N/A  History of Adenoma - Now for follow-up colonoscopy & has been > or = to 3 yrs.  N/A  Polyps Removed Today? Yes. ASA CLASS:   Class III INDICATIONS:average risk for colorectal cancer. MEDICATIONS: Monitored anesthesia care, Propofol 200 mg IV, and lidocaine 100 mg IV DESCRIPTION OF PROCEDURE:   After the risks benefits and alternatives of the procedure were thoroughly explained, informed consent was obtained.  The digital rectal exam revealed no abnormalities of the rectum.   The [id number]  endoscope was introduced through the anus and advanced to the cecum, which was identified by both the appendix and ileocecal valve. No adverse events experienced.   The quality of the prep was good, using MoviPrep  The instrument was then slowly withdrawn as the colon was fully examined.  COLON FINDINGS: A semi-pedunculated polyp measuring 10 mm in size was found in the ascending colon.  A polypectomy was performed using snare cautery. The resection was complete, the polyp tissue was completely retrieved and sent to histology.  A sessile polyp measuring 7 mm in size was found in the transverse colon.  A polypectomy was performed with a cold snare.  The resection was complete, the polyp tissue was completely retrieved and sent to histology. A sessile polyp measuring 6 mm in size was found in the rectum.  A polypectomy was performed using hot forceps. Intially cold forceps were used  however with persistent oozing I used hot forceps and hemostasis was acheived.  The resection was complete, the polyp tissue was completely retrieved and sent to histology. There was mild diverticulosis noted in the sigmoid colon.  The examination was otherwise normal.  Retroflexed views revealed no abnormalities. The time to cecum=1 minutes 47 seconds. Withdrawal time=18 minutes 29 seconds. The scope was withdrawn and the procedure completed. COMPLICATIONS: There were no immediate complications. ENDOSCOPIC IMPRESSION: 1.   Semi-pedunculated polyp in the ascending colon; polypectomy performed using snare cautery 2.   Sessile polyp in the transverse colon; polypectomy performed with a cold snare 3.   Sessile polyp in the rectum; polypectomy performed using hot forceps 4.   Mild diverticulosis  in the sigmoid colon  RECOMMENDATIONS: 1.  Hold Aspirin and all other NSAIDS for 2 weeks. 2.  Repeat colonoscopy in 3 years if largest polyp is adenomatous or if all 3 are adenomatous; 5 years if 1-2 of the smaller polyps are adenomatous; otherwise 10 years  eSigned:  Ladene Artist, MD, [facility] 11/29/2014 8:45 AM

## 2014-11-29 NOTE — Progress Notes (Signed)
No problems noted in the recovery room. maw 

## 2014-11-29 NOTE — Progress Notes (Signed)
Report to PACU, RN, vss, BBS= Clear.  

## 2014-11-29 NOTE — Patient Instructions (Addendum)
YOU HAD AN ENDOSCOPIC PROCEDURE TODAY AT THE [facility]: Refer to the procedure report that was given to you for any specific questions about what was found during the examination.  If the procedure report does not answer your questions, please call your gastroenterologist to clarify.  If you requested that your care partner not be given the details of your procedure findings, then the procedure report has been included in a sealed envelope for you to review at your convenience later.  YOU SHOULD EXPECT: Some feelings of bloating in the abdomen. Passage of more gas than usual.  Walking can help get rid of the air that was put into your GI tract during the procedure and reduce the bloating. If you had a lower endoscopy (such as a colonoscopy or flexible sigmoidoscopy) you may notice spotting of blood in your stool or on the toilet paper. If you underwent a bowel prep for your procedure, then you may not have a normal bowel movement for a few days.  DIET: Your first meal following the procedure should be a light meal and then it is ok to progress to your normal diet.  A half-sandwich or bowl of soup is an example of a good first meal.  Heavy or fried foods are harder to digest and may make you feel nauseous or bloated.  Likewise meals heavy in dairy and vegetables can cause extra gas to form and this can also increase the bloating.  Drink plenty of fluids but you should avoid alcoholic beverages for 24 hours.  ACTIVITY: Your care partner should take you home directly after the procedure.  You should plan to take it easy, moving slowly for the rest of the day.  You can resume normal activity the day after the procedure however you should NOT DRIVE or use heavy machinery for 24 hours (because of the sedation medicines used during the test).    SYMPTOMS TO REPORT IMMEDIATELY: A gastroenterologist can be reached at any hour.  During normal business hours, 8:30 AM to 5:00 PM Monday through Friday,  call [phone].  After hours and on weekends, please call the GI answering service at [phone] who will take a message and have the physician on call contact you.   Following lower endoscopy (colonoscopy or flexible sigmoidoscopy):  Excessive amounts of blood in the stool  Significant tenderness or worsening of abdominal pains  Swelling of the abdomen that is new, acute  Fever of 100F or higher  FOLLOW UP: If any biopsies were taken you will be contacted by phone or by letter within the next 1-3 weeks.  Call your gastroenterologist if you have not heard about the biopsies in 3 weeks.  Our staff will call the home number listed on your records the next business day following your procedure to check on you and address any questions or concerns that you may have at that time regarding the information given to you following your procedure. This is a courtesy call and so if there is no answer at the home number and we have not heard from you through the emergency physician on call, we will assume that you have returned to your regular daily activities without incident.  SIGNATURES/CONFIDENTIALITY: You and/or your care partner have signed paperwork which will be entered into your electronic medical record.  These signatures attest to the fact that that the information above on your After Visit Summary has been reviewed and is understood.  Full responsibility of the confidentiality of this   discharge information lies with you and/or your care-partner.     Handouts were given to your care partner on polyps, diverticulosis, and a high fiber diet with liberal fluid intake according to renal diet. You may resume your current medications today.  Except hold aspirin and all other anti-inflammatory medications (NSAIDS) for 2 weeks.  Hold aspirin 81 mg for 2 weeks too per Dr. Fuller Plan. Await biopsy results. Please call if any questions or concerns.

## 2014-12-04 ENCOUNTER — Telehealth: Payer: Self-pay

## 2014-12-04 NOTE — Telephone Encounter (Signed)
  Follow up Call-  Call back number 11/29/2014  Post procedure Call Back phone  # [phone]  Permission to leave phone message Yes     Patient questions:  Do you have a fever, pain , or abdominal swelling? No. Pain Score  0 *  Have you tolerated food without any problems? Yes.    Have you been able to return to your normal activities? Yes.    Do you have any questions about your discharge instructions: Diet   No. Medications  No. Follow up visit  No.  Do you have questions or concerns about your Care? No.  Actions: * If pain score is 4 or above: No action needed, pain <4.  I spoke with the pt's wife, the pt was not available.  No problems noted. maw

## 2014-12-06 ENCOUNTER — Encounter: Payer: Self-pay | Admitting: Gastroenterology

## 2014-12-07 ENCOUNTER — Encounter: Payer: Self-pay | Admitting: Vascular Surgery

## 2014-12-08 ENCOUNTER — Encounter: Payer: Self-pay | Admitting: Vascular Surgery

## 2014-12-08 ENCOUNTER — Ambulatory Visit (INDEPENDENT_AMBULATORY_CARE_PROVIDER_SITE_OTHER): Payer: Self-pay | Admitting: Vascular Surgery

## 2014-12-08 VITALS — BP 136/95 | HR 76 | Temp 98.6°F | Resp 16 | Ht 70.0 in | Wt 260.0 lb

## 2014-12-08 DIAGNOSIS — N185 Chronic kidney disease, stage 5: Secondary | ICD-10-CM

## 2014-12-08 NOTE — Progress Notes (Signed)
    Postoperative Access Visit   History of Present Illness  EREK FOTH is a 54 y.o. year old male who presents for postoperative follow-up for: R RC AVF (Date: 10/27/14).  The patient's wounds are healed.  The patient notes no steal symptoms.  The patient is able to complete their activities of daily living.  The patient's current symptoms are: none.  For VQI Use Only  PRE-ADM LIVING: Home  AMB STATUS: Ambulatory  Physical Examination Filed Vitals:   12/08/14 1623  BP: 136/95  Pulse: 76  Temp: 98.6 F (37 C)  Resp: 16    RUE: Incision is healed, skin feels warm, hand grip is 5/5, sensation in digits is intact, palpable thrill, bruit can be auscultated , on Sonosite: few large branches but main channel is >6 mm throughout  Medical Decision Making  HUNTER TRINGALE is a 54 y.o. year old male who presents s/p R RC AVF.  The patient's access is ready for use.  Thank you for allowing [place] to participate in this patient's care.  Adele Barthel, MD [facility] Office: [phone] Pager: [phone]  12/08/2014, 4:43 PM

## 2015-01-16 ENCOUNTER — Ambulatory Visit: Payer: Self-pay | Admitting: Podiatry

## 2015-02-14 ENCOUNTER — Telehealth: Payer: Self-pay | Admitting: *Deleted

## 2015-02-14 NOTE — Telephone Encounter (Signed)
APPT  MADE   PT  COMING  IN   03-21-15 AT  3:35  PM./CY

## 2015-02-14 NOTE — Telephone Encounter (Signed)
LM TO CALL  BACK  IS OVER DUE FOR  AN APPT   LAST  APPT  WITH DR Johnsie Cancel  WAS  08-2013 WAS  SUPPOSE TO RETURN  IN   6 MO  HAS NOT  BEEN  BACK  SINCE .Adonis Housekeeper

## 2015-03-07 ENCOUNTER — Other Ambulatory Visit: Payer: Self-pay

## 2015-03-07 DIAGNOSIS — T82510A Breakdown (mechanical) of surgically created arteriovenous fistula, initial encounter: Secondary | ICD-10-CM

## 2015-03-08 ENCOUNTER — Encounter: Payer: Self-pay | Admitting: Vascular Surgery

## 2015-03-09 ENCOUNTER — Ambulatory Visit (INDEPENDENT_AMBULATORY_CARE_PROVIDER_SITE_OTHER): Payer: 59 | Admitting: Vascular Surgery

## 2015-03-09 ENCOUNTER — Encounter: Payer: Self-pay | Admitting: Vascular Surgery

## 2015-03-09 ENCOUNTER — Ambulatory Visit (HOSPITAL_COMMUNITY)
Admission: RE | Admit: 2015-03-09 | Discharge: 2015-03-09 | Disposition: A | Discharge status: Home / Self Care | Payer: 59 | Source: Ambulatory Visit | Attending: Vascular Surgery | Admitting: Vascular Surgery

## 2015-03-09 VITALS — BP 124/91 | HR 62 | Ht 70.0 in | Wt 257.0 lb

## 2015-03-09 DIAGNOSIS — T82591A Other mechanical complication of surgically created arteriovenous shunt, initial encounter: Secondary | ICD-10-CM | POA: Insufficient documentation

## 2015-03-09 DIAGNOSIS — Y832 Surgical operation with anastomosis, bypass or graft as the cause of abnormal reaction of the patient, or of later complication, without mention of misadventure at the time of the procedure: Secondary | ICD-10-CM | POA: Insufficient documentation

## 2015-03-09 DIAGNOSIS — T82510A Breakdown (mechanical) of surgically created arteriovenous fistula, initial encounter: Secondary | ICD-10-CM

## 2015-03-09 DIAGNOSIS — N185 Chronic kidney disease, stage 5: Secondary | ICD-10-CM | POA: Diagnosis not present

## 2015-03-09 NOTE — Progress Notes (Signed)
Established Dialysis Access  History of Present Illness  [name] [name] is a 54 y.o. (12-15-1960) male who presents for re-evaluation for permanent access.  The patient is right hand dominant.  Previous access procedures have been completed in the left and right arm.  The patient's complication from previous access procedures include: thrombosis.  He recently was hospitalized for a R nephrectomy.  His RC AVF had been functioning well when it acutely thrombosed during that hospitalization.  It has been > 2 weeks since the thrombosis of the R RC AVF.  The patient is in the process of evaluation for a kidney transplant.   Past Medical History  Diagnosis Date  . Gout   . PAF (paroxysmal atrial fibrillation)     happens about every 6 years-sees nishan  . Hypertension   . Dysrhythmia     EPISODES OF A FIB "EVERY FEW YRS" WAS GIVEN METOPROLOL TO HELP CONTROL - NO PROBLEM FOR PAST YEAR - FOLLOWED BY DR.L NISHAN  . Hyperlipidemia   . Arthritis   . Renal insufficiency     KIDNEY DISEASE - FOLLOWED BY DR. Lorrene Reid NOTES ON CHART  . Cataract     bilateral- right removed, left present  . Enlarged kidney     right    Past Surgical History  Procedure Laterality Date  . Knee surgery  2001  . Av fistula placement      "IT CLOSED UP AND DOES NOT WORK"  . Cataracts removed Right   . Laparoscopic gastric banding N/A 06/20/2014    Procedure: LAPAROSCOPIC GASTRIC BANDING and VENTRAL HERNIA REPAIR WITH MESH;  Surgeon: Pedro Earls, MD;  Location: [facility];  Service: General;  Laterality: N/A;  . Insertion of mesh N/A 06/20/2014    Procedure: INSERTION OF MESH;  Surgeon: Pedro Earls, MD;  Location: [facility];  Service: General;  Laterality: N/A;  . Hernia repair    . Av fistula placement Right 10/27/2014    Procedure: ARTERIOVENOUS (AV) FISTULA CREATION;  Surgeon: Conrad Liberty, MD;  Location: [facility];  Service: Vascular;  Laterality: Right;  . Total nephrectomy Right     History   Social  History  . Marital Status: Married    Spouse Name: N/A  . Number of Children: N/A  . Years of Education: N/A   Occupational History  . Not on file.   Social History Main Topics  . Smoking status: Never Smoker   . Smokeless tobacco: Never Used  . Alcohol Use: No  . Drug Use: No  . Sexual Activity: Not on file   Other Topics Concern  . Not on file   Social History Narrative    Family History  Problem Relation Age of Onset  . CAD    . Hypertension Mother   . Cancer Father     bone  . Cancer Sister     breast  . Cancer Sister     breast  . Colon cancer Neg Hx   . Rectal cancer Neg Hx   . Stomach cancer Neg Hx     Current Outpatient Prescriptions on File Prior to Visit  Medication Sig Dispense Refill  . amLODipine (NORVASC) 10 MG tablet Take 10 mg by mouth every morning.     Marland Kitchen aspirin EC 81 MG tablet Take 81 mg by mouth daily.    Marland Kitchen atorvastatin (LIPITOR) 10 MG tablet Take 10 mg by mouth every morning.     . Febuxostat (ULORIC) 80 MG TABS  Take 80 mg by mouth daily.    . furosemide (LASIX) 40 MG tablet Take 40 mg by mouth daily.    . metoprolol (LOPRESSOR) 50 MG tablet Take 25 mg by mouth every morning.     Marland Kitchen MOVIPREP 100 G SOLR     . oxyCODONE-acetaminophen (PERCOCET/ROXICET) 5-325 MG per tablet     . Vitamin D, Ergocalciferol, (DRISDOL) 50000 UNITS CAPS capsule      No current facility-administered medications on file prior to visit.    No Known Allergies  REVIEW OF SYSTEMS:  (Positives checked otherwise negative)  CARDIOVASCULAR:  []  chest pain, []  chest pressure, []  palpitations, []  shortness of breath when laying flat, []  shortness of breath with exertion,  []  pain in feet when walking, []  pain in feet when laying flat, []  history of blood clot in veins (DVT), []  history of phlebitis, []  swelling in legs, []  varicose veins  PULMONARY:  []  productive cough, []  asthma, []  wheezing  NEUROLOGIC:  []  weakness in arms or legs, []  numbness in arms or legs, []   difficulty speaking or slurred speech, []  temporary loss of vision in one eye, []  dizziness  HEMATOLOGIC:  []  bleeding problems, []  problems with blood clotting too easily  MUSCULOSKEL:  []  joint pain, []  joint swelling  GASTROINTEST:  []  vomiting blood, []  blood in stool     GENITOURINARY:  []  burning with urination, []  blood in urine  PSYCHIATRIC:  []  history of major depression  INTEGUMENTARY:  []  rashes, []  ulcers     Physical Examination  Filed Vitals:   03/09/15 1121 03/09/15 1124  BP: 141/101 124/91  Pulse: 61 62  Height: 5\' 10"  (1.778 m)   Weight: 257 lb (116.574 kg)   SpO2: 100%    Body mass index is 36.88 kg/(m^2).  General: A&O x 3, WD, WN  Pulmonary: Sym exp, good air movt, CTAB, no rales, rhonchi, & wheezing  Cardiac: RRR, Nl S1, S2, no Murmurs, rubs or gallops  Vascular: Vessel Right Left  Radial Palpable Palpable  Ulnar Faintly Palpable Faintly Palpable  Brachial Palpable Palpable   Gastrointestinal: soft, NTND, -G/R, - HSM, - masses, - CVAT B  Musculoskeletal: M/S 5/5 throughout , Extremities without  ischemic changes , no palpable thrill in access in R arm, no bruit in access  Neurologic: Pain and light touch intact in extremities , Motor exam as listed above  Medical Decision Making  [name] [name] is a 54 y.o. male who presents with chronic kidney disease stage V                                          To date, a L BVT and R RC AVF have failed.  I suspect this most recent failure is related relative hypotension during her recent nephrectomy.  He is out of the window for thrombectomy of this fistula, so I would proceed with a R BC AVF.  He is scheduled for a possible lap. Cholecystectomy soon, so we discussed delaying until after that cholecystectomy.  I had an extensive discussion with this patient in regards to the nature of access surgery, including risk, benefits, and alternatives.    The patient is aware that the risks of access  surgery include but are not limited to: bleeding, infection, steal syndrome, nerve damage, ischemic monomelic neuropathy, failure of access to mature, and possible need for additional access procedures  in the future.  The patient has agreed to proceed with the above procedure which will be scheduled 2 weeks after his lap cholecystectomy.Adele Barthel, MD [facility] Office: [phone] Pager: [phone]  03/09/2015, 12:21 PM

## 2015-03-21 ENCOUNTER — Encounter: Payer: Self-pay | Admitting: Cardiovascular Disease

## 2015-03-21 ENCOUNTER — Ambulatory Visit (INDEPENDENT_AMBULATORY_CARE_PROVIDER_SITE_OTHER): Payer: 59 | Admitting: Cardiovascular Disease

## 2015-03-21 VITALS — BP 140/96 | HR 52 | Ht 70.0 in | Wt 261.1 lb

## 2015-03-21 DIAGNOSIS — I10 Essential (primary) hypertension: Secondary | ICD-10-CM

## 2015-03-21 NOTE — Progress Notes (Signed)
Patient ID: [name] [name], male   DOB: November 20, 1961, 54 y.o.   MRN: [id number]      54 y.o. referred by Dr Mercy Moore for irregular heart beat in 2014 .  I have seen him in 2003 and 2009  His mother in law is a patient of mine [employer].  He has had a normal cath in 2003 and normal myovue in 2009.  Has CRF.  Failed LUE fistula by Dr Trula Slade.  Concerned that he may not be a renal transplant candidate because of his obesity.  Gets very infrequent palpitations. Seen in ER 8/27 with presumed 12 hour episode of PAF  By time he was evaluated in ER NSR.  Has not wanted to be on coumadin in past [place] score 2.  No chest pain syncope or dyspnea.  Started on lopressor by Dr Lorrene Reid.  Had some calf tightness and he lowered dose himself.  Asymptomatic now  Since last visit had RUE fistula placed by Dr Bridgett Larsson  10/2014 but this failed also  Had lapband by Dr Hassell Done 05/2014  Lost over 40 lbs    Transplant evaluation at [facility].  Normal myovue a month ago Needs GB surgery with Dr Hassell Done for stones before transplant    ROS: Denies fever, malais, weight loss, blurry vision, decreased visual acuity, cough, sputum, SOB, hemoptysis, pleuritic pain, palpitaitons, heartburn, abdominal pain, melena, lower extremity edema, claudication, or rash.  All other systems reviewed and negative   General: Affect appropriate Healthy:  appears stated age 54: normal Neck supple with no adenopathy JVP normal no bruits no thyromegaly Lungs clear with no wheezing and good diaphragmatic motion Heart:  S1/S2 no murmur,rub, gallop or click PMI normal Abdomen: benighn, BS positve, no tenderness, no AAA no bruit.  No HSM or HJR Distal pulses intact with no bruits  Failed LUE fistula with no thrill  Failed Right wrist fistula  No edema Neuro non-focal Skin warm and dry No muscular weakness  Medications Current Outpatient Prescriptions  Medication Sig Dispense Refill  . amLODipine (NORVASC) 10 MG tablet Take 10 mg by mouth  every morning.     Marland Kitchen aspirin EC 81 MG tablet Take 81 mg by mouth daily.    Marland Kitchen atorvastatin (LIPITOR) 10 MG tablet Take 10 mg by mouth every morning.     . colchicine 0.6 MG tablet     . DOCQLACE 100 MG capsule     . Febuxostat (ULORIC) 80 MG TABS Take 80 mg by mouth daily.    . furosemide (LASIX) 40 MG tablet Take 40 mg by mouth daily.    Marland Kitchen HYDROcodone-acetaminophen (NORCO/VICODIN) 5-325 MG per tablet     . metoprolol (LOPRESSOR) 50 MG tablet Take 25 mg by mouth every morning.     Marland Kitchen MOVIPREP 100 G SOLR     . oxyCODONE-acetaminophen (PERCOCET/ROXICET) 5-325 MG per tablet     . predniSONE (DELTASONE) 10 MG tablet     . Vitamin D, Ergocalciferol, (DRISDOL) 50000 UNITS CAPS capsule      No current facility-administered medications for this visit.    Allergies Review of patient's allergies indicates no known allergies.  Family History: Family History  Problem Relation Age of Onset  . CAD    . Hypertension Mother   . Cancer Father     bone  . Cancer Sister     breast  . Cancer Sister     breast  . Colon cancer Neg Hx   . Rectal cancer Neg Hx   .  Stomach cancer Neg Hx     Social History: History   Social History  . Marital Status: Married    Spouse Name: N/A  . Number of Children: N/A  . Years of Education: N/A   Occupational History  . Not on file.   Social History Main Topics  . Smoking status: Never Smoker   . Smokeless tobacco: Never Used  . Alcohol Use: No  . Drug Use: No  . Sexual Activity: Not on file   Other Topics Concern  . Not on file   Social History Narrative    Electrocardiogram:  07/2713  SR rate 92 normal ECG  03/21/15  SR rate 52 normal   Assessment and Plan Chest Pain:  Resolved normal cath 2003 and normal myovue 2009 and 01/2015 at [facility] CRF:  Cr just under 4 no signs of uremia and still urinating.  Advised him not to have anymore fistula's placed Can have emergent dialysis if needed with catheter rather than wasting all his sights before  potential transplant F/U with Dr Bridgett Larsson and Lorrene Reid PAF  :  Maintaining NSR  Continue beta blocker and ASA  Cholesterol:  Continue statin  GBD:  Apparently has a lot of stones and needs cholycystectomy before renal translplant can be done f/u Dr Ulla Potash

## 2015-03-21 NOTE — Patient Instructions (Signed)
Medication Instructions:  Your physician recommends that you continue on your current medications as directed. Please refer to the Current Medication list given to you today.   Labwork: NONE  Testing/Procedures: NONE  Follow-Up: Your physician wants you to follow-up in: 6 MONTHS  WITH  DR NISHAN  You will receive a reminder letter in the mail two months in advance. If you don't receive a letter, please call our office to schedule the follow-up appointment.  Any Other Special Instructions Will Be Listed Below (If Applicable).   

## 2015-05-25 ENCOUNTER — Ambulatory Visit: Payer: Self-pay | Admitting: Surgery

## 2015-06-25 ENCOUNTER — Encounter (HOSPITAL_COMMUNITY): Payer: Self-pay

## 2015-06-27 NOTE — Patient Instructions (Addendum)
[name] [name]  06/27/2015   Your procedure is scheduled on: Friday 07/13/2015  Report to [facility] Main  Entrance take [place]  elevators to 3rd floor to  [place] at  [place] AM.  Call this number if you have problems the morning of surgery [phone]   Remember: ONLY 1 PERSON MAY GO WITH YOU TO SHORT STAY TO GET  READY MORNING OF [place].  Do not eat food or drink liquids :After Midnight.                REMEMBER TO USE FLEETS ENEMA NIGHT BEFORE SURGERY!     Take these medicines the morning of surgery with A SIP OF WATER: Amlodipine, Metoprolol, use opthalmic drops, Uloric                               You may not have any metal on your body including hair pins and              piercings  Do not wear jewelry, make-up, lotions, powders or perfumes, deodorant             Do not wear nail polish.  Do not shave  48 hours prior to surgery.              Men may shave face and neck.   Do not bring valuables to the hospital. [place].  Contacts, dentures or bridgework may not be worn into surgery.  Leave suitcase in the car. After surgery it may be brought to your room.     Patients discharged the day of surgery will not be allowed to drive home.  Name and phone number of your driver:  Special Instructions: N/A              Please read over the following fact sheets you were given: _____________________________________________________________________             [facility] - Preparing for Surgery Before surgery, you can play an important role.  Because skin is not sterile, your skin needs to be as free of germs as possible.  You can reduce the number of germs on your skin by washing with CHG (chlorahexidine gluconate) soap before surgery.  CHG is an antiseptic cleaner which kills germs and bonds with the skin to continue killing germs even after washing. Please DO NOT use if you have an allergy  to CHG or antibacterial soaps.  If your skin becomes reddened/irritated stop using the CHG and inform your nurse when you arrive at Short Stay. Do not shave (including legs and underarms) for at least 48 hours prior to the first CHG shower.  You may shave your face/neck. Please follow these instructions carefully:  1.  Shower with CHG Soap the night before surgery and the  morning of Surgery.  2.  If you choose to wash your hair, wash your hair first as usual with your  normal  shampoo.  3.  After you shampoo, rinse your hair and body thoroughly to remove the  shampoo.                           4.  Use CHG as you would  any other liquid soap.  You can apply chg directly  to the skin and wash                       Gently with a scrungie or clean washcloth.  5.  Apply the CHG Soap to your body ONLY FROM THE NECK DOWN.   Do not use on face/ open                           Wound or open sores. Avoid contact with eyes, ears mouth and genitals (private parts).                       Wash face,  Genitals (private parts) with your normal soap.             6.  Wash thoroughly, paying special attention to the area where your surgery  will be performed.  7.  Thoroughly rinse your body with warm water from the neck down.  8.  DO NOT shower/wash with your normal soap after using and rinsing off  the CHG Soap.                9.  Pat yourself dry with a clean towel.            10.  Wear clean pajamas.            11.  Place clean sheets on your bed the night of your first shower and do not  sleep with pets. Day of Surgery : Do not apply any lotions/deodorants the morning of surgery.  Please wear clean clothes to the hospital/surgery center.  FAILURE TO FOLLOW THESE INSTRUCTIONS MAY RESULT IN THE CANCELLATION OF YOUR SURGERY PATIENT SIGNATURE_________________________________  NURSE SIGNATURE__________________________________  ________________________________________________________________________

## 2015-06-28 ENCOUNTER — Encounter (HOSPITAL_COMMUNITY)
Admission: RE | Admit: 2015-06-28 | Discharge: 2015-06-28 | Disposition: A | Discharge status: Home / Self Care | Payer: Commercial Managed Care - HMO | Source: Ambulatory Visit | Attending: Surgery | Admitting: Surgery

## 2015-06-28 ENCOUNTER — Encounter (HOSPITAL_COMMUNITY): Payer: Self-pay

## 2015-06-28 DIAGNOSIS — Z01812 Encounter for preprocedural laboratory examination: Secondary | ICD-10-CM | POA: Diagnosis not present

## 2015-06-28 DIAGNOSIS — K8080 Other cholelithiasis without obstruction: Secondary | ICD-10-CM | POA: Diagnosis not present

## 2015-06-28 HISTORY — DX: Other complications of anesthesia, initial encounter: T88.59XA

## 2015-06-28 HISTORY — DX: Adverse effect of unspecified anesthetic, initial encounter: T41.45XA

## 2015-06-28 LAB — CBC
HCT: 39.5 % (ref 39.0–52.0)
Hemoglobin: 12.9 g/dL — ABNORMAL LOW (ref 13.0–17.0)
MCH: 29.5 pg (ref 26.0–34.0)
MCHC: 32.7 g/dL (ref 30.0–36.0)
MCV: 90.4 fL (ref 78.0–100.0)
PLATELETS: 185 10*3/uL (ref 150–400)
RBC: 4.37 MIL/uL (ref 4.22–5.81)
RDW: 13.4 % (ref 11.5–15.5)
WBC: 5.6 10*3/uL (ref 4.0–10.5)

## 2015-06-28 LAB — BASIC METABOLIC PANEL
Anion gap: 9 (ref 5–15)
BUN: 59 mg/dL — ABNORMAL HIGH (ref 6–20)
CHLORIDE: 107 mmol/L (ref 101–111)
CO2: 25 mmol/L (ref 22–32)
Calcium: 9.6 mg/dL (ref 8.9–10.3)
Creatinine, Ser: 4.03 mg/dL — ABNORMAL HIGH (ref 0.61–1.24)
GFR calc Af Amer: 18 mL/min — ABNORMAL LOW (ref >60)
GFR, EST NON AFRICAN AMERICAN: 15 mL/min — AB (ref >60)
Glucose, Bld: 116 mg/dL — ABNORMAL HIGH (ref 65–99)
Potassium: 4.2 mmol/L (ref 3.5–5.1)
Sodium: 141 mmol/L (ref 135–145)

## 2015-06-28 NOTE — Progress Notes (Signed)
   06/28/15 1108  OBSTRUCTIVE SLEEP APNEA  Have you ever been diagnosed with sleep apnea through a sleep study? No  Do you snore loudly (loud enough to be heard through closed doors)?  0  Do you often feel tired, fatigued, or sleepy during the daytime? 0  Has anyone observed you stop breathing during your sleep? 0  Do you have, or are you being treated for high blood pressure? 1  BMI more than 35 kg/m2? 1  Age over 56 years old? 1  Neck circumference greater than 40 cm/16 inches? 1  Gender: 1

## 2015-07-12 MED ORDER — DEXTROSE 5 % IV SOLN
3.0000 g | INTRAVENOUS | Status: AC
  Administered 2015-07-13: 3 g via INTRAVENOUS
  Filled 2015-07-12: qty 3000

## 2015-07-13 ENCOUNTER — Observation Stay (HOSPITAL_COMMUNITY)
Admission: RE | Admit: 2015-07-13 | Discharge: 2015-07-14 | Disposition: A | Discharge status: Home / Self Care | Payer: Commercial Managed Care - HMO | Source: Ambulatory Visit | Attending: Surgery | Admitting: Surgery

## 2015-07-13 ENCOUNTER — Ambulatory Visit (HOSPITAL_COMMUNITY): Payer: Commercial Managed Care - HMO | Admitting: Certified Registered Nurse Anesthetist

## 2015-07-13 ENCOUNTER — Encounter (HOSPITAL_COMMUNITY)
Admission: RE | Disposition: A | Discharge status: Home / Self Care | Payer: Self-pay | Source: Ambulatory Visit | Attending: Surgery

## 2015-07-13 ENCOUNTER — Encounter (HOSPITAL_COMMUNITY): Payer: Self-pay | Admitting: *Deleted

## 2015-07-13 ENCOUNTER — Ambulatory Visit (HOSPITAL_COMMUNITY): Payer: Commercial Managed Care - HMO

## 2015-07-13 DIAGNOSIS — I120 Hypertensive chronic kidney disease with stage 5 chronic kidney disease or end stage renal disease: Secondary | ICD-10-CM | POA: Insufficient documentation

## 2015-07-13 DIAGNOSIS — Z9884 Bariatric surgery status: Secondary | ICD-10-CM | POA: Diagnosis not present

## 2015-07-13 DIAGNOSIS — Z79899 Other long term (current) drug therapy: Secondary | ICD-10-CM | POA: Diagnosis not present

## 2015-07-13 DIAGNOSIS — K8010 Calculus of gallbladder with chronic cholecystitis without obstruction: Principal | ICD-10-CM | POA: Insufficient documentation

## 2015-07-13 DIAGNOSIS — I480 Paroxysmal atrial fibrillation: Secondary | ICD-10-CM | POA: Insufficient documentation

## 2015-07-13 DIAGNOSIS — E785 Hyperlipidemia, unspecified: Secondary | ICD-10-CM | POA: Insufficient documentation

## 2015-07-13 DIAGNOSIS — M1990 Unspecified osteoarthritis, unspecified site: Secondary | ICD-10-CM | POA: Diagnosis not present

## 2015-07-13 DIAGNOSIS — M109 Gout, unspecified: Secondary | ICD-10-CM | POA: Diagnosis not present

## 2015-07-13 DIAGNOSIS — Z9049 Acquired absence of other specified parts of digestive tract: Secondary | ICD-10-CM

## 2015-07-13 DIAGNOSIS — N19 Unspecified kidney failure: Secondary | ICD-10-CM

## 2015-07-13 DIAGNOSIS — Z905 Acquired absence of kidney: Secondary | ICD-10-CM | POA: Diagnosis not present

## 2015-07-13 DIAGNOSIS — Z992 Dependence on renal dialysis: Secondary | ICD-10-CM | POA: Diagnosis not present

## 2015-07-13 DIAGNOSIS — Z6838 Body mass index (BMI) 38.0-38.9, adult: Secondary | ICD-10-CM | POA: Diagnosis not present

## 2015-07-13 DIAGNOSIS — H269 Unspecified cataract: Secondary | ICD-10-CM | POA: Diagnosis not present

## 2015-07-13 DIAGNOSIS — Z7982 Long term (current) use of aspirin: Secondary | ICD-10-CM | POA: Diagnosis not present

## 2015-07-13 DIAGNOSIS — N186 End stage renal disease: Secondary | ICD-10-CM | POA: Diagnosis not present

## 2015-07-13 DIAGNOSIS — Z419 Encounter for procedure for purposes other than remedying health state, unspecified: Secondary | ICD-10-CM

## 2015-07-13 HISTORY — DX: Unspecified kidney failure: N19

## 2015-07-13 HISTORY — DX: Acquired absence of other specified parts of digestive tract: Z90.49

## 2015-07-13 HISTORY — PX: CHOLECYSTECTOMY: SHX55

## 2015-07-13 LAB — CBC
HCT: 35.4 % — ABNORMAL LOW (ref 39.0–52.0)
Hemoglobin: 11.6 g/dL — ABNORMAL LOW (ref 13.0–17.0)
MCH: 29.2 pg (ref 26.0–34.0)
MCHC: 32.8 g/dL (ref 30.0–36.0)
MCV: 89.2 fL (ref 78.0–100.0)
PLATELETS: 171 10*3/uL (ref 150–400)
RBC: 3.97 MIL/uL — ABNORMAL LOW (ref 4.22–5.81)
RDW: 13.2 % (ref 11.5–15.5)
WBC: 8.6 10*3/uL (ref 4.0–10.5)

## 2015-07-13 LAB — BASIC METABOLIC PANEL
ANION GAP: 8 (ref 5–15)
BUN: 50 mg/dL — ABNORMAL HIGH (ref 6–20)
CALCIUM: 9 mg/dL (ref 8.9–10.3)
CHLORIDE: 112 mmol/L — AB (ref 101–111)
CO2: 22 mmol/L (ref 22–32)
Creatinine, Ser: 4.24 mg/dL — ABNORMAL HIGH (ref 0.61–1.24)
GFR calc Af Amer: 17 mL/min — ABNORMAL LOW (ref >60)
GFR calc non Af Amer: 14 mL/min — ABNORMAL LOW (ref >60)
GLUCOSE: 148 mg/dL — AB (ref 65–99)
POTASSIUM: 4.1 mmol/L (ref 3.5–5.1)
SODIUM: 142 mmol/L (ref 135–145)

## 2015-07-13 SURGERY — LAPAROSCOPIC CHOLECYSTECTOMY WITH INTRAOPERATIVE CHOLANGIOGRAM
Anesthesia: General

## 2015-07-13 MED ORDER — METOPROLOL TARTRATE 25 MG PO TABS
25.0000 mg | ORAL_TABLET | Freq: Every morning | ORAL | Status: DC
  Administered 2015-07-14: 25 mg via ORAL
  Filled 2015-07-13: qty 1

## 2015-07-13 MED ORDER — PHENYLEPHRINE 40 MCG/ML (10ML) SYRINGE FOR IV PUSH (FOR BLOOD PRESSURE SUPPORT)
PREFILLED_SYRINGE | INTRAVENOUS | Status: AC
  Filled 2015-07-13: qty 10

## 2015-07-13 MED ORDER — SODIUM CHLORIDE 0.9 % IV SOLN
INTRAVENOUS | Status: DC
  Administered 2015-07-13: 13:00:00 via INTRAVENOUS

## 2015-07-13 MED ORDER — AMLODIPINE BESYLATE 10 MG PO TABS
10.0000 mg | ORAL_TABLET | Freq: Every morning | ORAL | Status: DC
  Administered 2015-07-14: 10 mg via ORAL
  Filled 2015-07-13: qty 1

## 2015-07-13 MED ORDER — ONDANSETRON HCL 4 MG/2ML IJ SOLN
INTRAMUSCULAR | Status: AC
  Filled 2015-07-13: qty 2

## 2015-07-13 MED ORDER — FEBUXOSTAT 40 MG PO TABS
80.0000 mg | ORAL_TABLET | Freq: Every morning | ORAL | Status: DC
  Administered 2015-07-13 – 2015-07-14 (×2): 80 mg via ORAL
  Filled 2015-07-13 (×2): qty 2

## 2015-07-13 MED ORDER — LIDOCAINE HCL (CARDIAC) 20 MG/ML IV SOLN
INTRAVENOUS | Status: AC
  Filled 2015-07-13: qty 5

## 2015-07-13 MED ORDER — ONDANSETRON HCL 4 MG/2ML IJ SOLN
INTRAMUSCULAR | Status: DC | PRN
  Administered 2015-07-13: 4 mg via INTRAVENOUS

## 2015-07-13 MED ORDER — LIDOCAINE HCL (CARDIAC) 20 MG/ML IV SOLN
INTRAVENOUS | Status: DC | PRN
  Administered 2015-07-13: 50 mg via INTRAVENOUS

## 2015-07-13 MED ORDER — ONDANSETRON 4 MG PO TBDP: 4.0000 mg | ORAL_TABLET | Freq: Four times a day (QID) | ORAL | Status: DC | PRN

## 2015-07-13 MED ORDER — MIDAZOLAM HCL 5 MG/5ML IJ SOLN
INTRAMUSCULAR | Status: DC | PRN
  Administered 2015-07-13: 2 mg via INTRAVENOUS

## 2015-07-13 MED ORDER — LACTATED RINGERS IV SOLN
INTRAVENOUS | Status: DC | PRN
  Administered 2015-07-13: 11:00:00 via INTRAVENOUS

## 2015-07-13 MED ORDER — GLYCOPYRROLATE 0.2 MG/ML IJ SOLN
INTRAMUSCULAR | Status: DC | PRN
Start: 2015-07-13 — End: 2015-07-13
  Administered 2015-07-13: .6 mg via INTRAVENOUS

## 2015-07-13 MED ORDER — HYDROCODONE-ACETAMINOPHEN 5-325 MG PO TABS
1.0000 | ORAL_TABLET | ORAL | Status: DC | PRN
  Administered 2015-07-13 (×2): 2 via ORAL
  Filled 2015-07-13 (×2): qty 2

## 2015-07-13 MED ORDER — EPHEDRINE SULFATE 50 MG/ML IJ SOLN
INTRAMUSCULAR | Status: DC | PRN
  Administered 2015-07-13 (×5): 10 mg via INTRAVENOUS

## 2015-07-13 MED ORDER — HEPARIN SODIUM (PORCINE) 5000 UNIT/ML IJ SOLN
5000.0000 [IU] | Freq: Three times a day (TID) | INTRAMUSCULAR | Status: DC
  Administered 2015-07-13 – 2015-07-14 (×2): 5000 [IU] via SUBCUTANEOUS
  Filled 2015-07-13 (×5): qty 1

## 2015-07-13 MED ORDER — ONDANSETRON HCL 4 MG/2ML IJ SOLN: 4.0000 mg | Freq: Four times a day (QID) | INTRAMUSCULAR | Status: DC | PRN

## 2015-07-13 MED ORDER — MORPHINE SULFATE 2 MG/ML IJ SOLN: 1.0000 mg | INTRAMUSCULAR | Status: DC | PRN

## 2015-07-13 MED ORDER — 0.9 % SODIUM CHLORIDE (POUR BTL) OPTIME
TOPICAL | Status: DC | PRN
  Administered 2015-07-13: 1000 mL

## 2015-07-13 MED ORDER — DEXTROSE-NACL 5-0.45 % IV SOLN
INTRAVENOUS | Status: DC
  Administered 2015-07-13: 17:00:00 via INTRAVENOUS

## 2015-07-13 MED ORDER — GLYCOPYRROLATE 0.2 MG/ML IJ SOLN
INTRAMUSCULAR | Status: AC
  Filled 2015-07-13: qty 3

## 2015-07-13 MED ORDER — SODIUM CHLORIDE 0.9 % IV SOLN
INTRAVENOUS | Status: DC | PRN
  Administered 2015-07-13: 09:00:00 via INTRAVENOUS

## 2015-07-13 MED ORDER — MIDAZOLAM HCL 2 MG/2ML IJ SOLN
INTRAMUSCULAR | Status: AC
  Filled 2015-07-13: qty 4

## 2015-07-13 MED ORDER — FUROSEMIDE 40 MG PO TABS
40.0000 mg | ORAL_TABLET | Freq: Every morning | ORAL | Status: DC
  Administered 2015-07-13 – 2015-07-14 (×2): 40 mg via ORAL
  Filled 2015-07-13 (×2): qty 1

## 2015-07-13 MED ORDER — SODIUM CHLORIDE 0.9 % IJ SOLN
INTRAMUSCULAR | Status: DC | PRN
  Administered 2015-07-13: 12 mL

## 2015-07-13 MED ORDER — HEPARIN SODIUM (PORCINE) 5000 UNIT/ML IJ SOLN
5000.0000 [IU] | Freq: Once | INTRAMUSCULAR | Status: AC
  Administered 2015-07-13: 5000 [IU] via SUBCUTANEOUS
  Filled 2015-07-13: qty 1

## 2015-07-13 MED ORDER — BUPIVACAINE LIPOSOME 1.3 % IJ SUSP
20.0000 mL | Freq: Once | INTRAMUSCULAR | Status: DC
  Filled 2015-07-13: qty 20

## 2015-07-13 MED ORDER — HYDROMORPHONE HCL 1 MG/ML IJ SOLN: 0.2500 mg | INTRAMUSCULAR | Status: DC | PRN

## 2015-07-13 MED ORDER — LACTATED RINGERS IR SOLN
Status: DC | PRN
  Administered 2015-07-13: 1000 mL

## 2015-07-13 MED ORDER — TRAZODONE HCL 50 MG PO TABS: 100.0000 mg | ORAL_TABLET | Freq: Every evening | ORAL | Status: DC | PRN

## 2015-07-13 MED ORDER — PREDNISOLONE ACETATE 1 % OP SUSP: 1.0000 [drp] | Freq: Three times a day (TID) | OPHTHALMIC | Status: DC

## 2015-07-13 MED ORDER — BUPIVACAINE-EPINEPHRINE (PF) 0.25% -1:200000 IJ SOLN
INTRAMUSCULAR | Status: AC
  Filled 2015-07-13: qty 30

## 2015-07-13 MED ORDER — PANTOPRAZOLE SODIUM 40 MG IV SOLR
40.0000 mg | Freq: Every day | INTRAVENOUS | Status: DC
  Administered 2015-07-13: 40 mg via INTRAVENOUS
  Filled 2015-07-13 (×2): qty 40

## 2015-07-13 MED ORDER — BUPIVACAINE LIPOSOME 1.3 % IJ SUSP
INTRAMUSCULAR | Status: DC | PRN
  Administered 2015-07-13: 20 mL

## 2015-07-13 MED ORDER — PROPOFOL 10 MG/ML IV BOLUS
INTRAVENOUS | Status: DC | PRN
  Administered 2015-07-13: 200 mg via INTRAVENOUS

## 2015-07-13 MED ORDER — COLCHICINE 0.6 MG PO TABS: 0.6000 mg | ORAL_TABLET | Freq: Every day | ORAL | Status: DC | PRN

## 2015-07-13 MED ORDER — LACTATED RINGERS IV SOLN: INTRAVENOUS | Status: DC

## 2015-07-13 MED ORDER — GATIFLOXACIN 0.5 % OP SOLN: 1.0000 [drp] | Freq: Four times a day (QID) | OPHTHALMIC | Status: DC

## 2015-07-13 MED ORDER — HEMOSTATIC AGENTS (NO CHARGE) OPTIME
TOPICAL | Status: DC | PRN
  Administered 2015-07-13: 1 via TOPICAL

## 2015-07-13 MED ORDER — NEOSTIGMINE METHYLSULFATE 10 MG/10ML IV SOLN
INTRAVENOUS | Status: AC
Start: 2015-07-13 — End: 2015-07-13
  Filled 2015-07-13: qty 1

## 2015-07-13 MED ORDER — NEOSTIGMINE METHYLSULFATE 10 MG/10ML IV SOLN
INTRAVENOUS | Status: DC | PRN
  Administered 2015-07-13: 4 mg via INTRAVENOUS

## 2015-07-13 MED ORDER — PROPOFOL 10 MG/ML IV BOLUS
INTRAVENOUS | Status: AC
  Filled 2015-07-13: qty 20

## 2015-07-13 MED ORDER — ROCURONIUM BROMIDE 100 MG/10ML IV SOLN
INTRAVENOUS | Status: AC
  Filled 2015-07-13: qty 1

## 2015-07-13 MED ORDER — FENTANYL CITRATE (PF) 250 MCG/5ML IJ SOLN
INTRAMUSCULAR | Status: AC
  Filled 2015-07-13: qty 25

## 2015-07-13 MED ORDER — ROCURONIUM BROMIDE 100 MG/10ML IV SOLN
INTRAVENOUS | Status: DC | PRN
  Administered 2015-07-13: 40 mg via INTRAVENOUS
  Administered 2015-07-13: 5 mg via INTRAVENOUS
  Administered 2015-07-13 (×2): 10 mg via INTRAVENOUS

## 2015-07-13 MED ORDER — FENTANYL CITRATE (PF) 100 MCG/2ML IJ SOLN
INTRAMUSCULAR | Status: DC | PRN
  Administered 2015-07-13: 50 ug via INTRAVENOUS
  Administered 2015-07-13: 100 ug via INTRAVENOUS

## 2015-07-13 SURGICAL SUPPLY — 34 items
APPLIER CLIP ROT 10 11.4 M/L (STAPLE) ×2
BENZOIN TINCTURE PRP APPL 2/3 (GAUZE/BANDAGES/DRESSINGS) IMPLANT
CABLE HIGH FREQUENCY MONO STRZ (ELECTRODE) ×2 IMPLANT
CATH REDDICK CHOLANGI 4FR 50CM (CATHETERS) ×2 IMPLANT
CLIP APPLIE ROT 10 11.4 M/L (STAPLE) ×1 IMPLANT
COVER MAYO STAND STRL (DRAPES) ×2 IMPLANT
COVER SURGICAL LIGHT HANDLE (MISCELLANEOUS) ×2 IMPLANT
DECANTER SPIKE VIAL GLASS SM (MISCELLANEOUS) IMPLANT
DEVICE TROCAR PUNCTURE CLOSURE (ENDOMECHANICALS) ×2 IMPLANT
DRAPE C-ARM 42X120 X-RAY (DRAPES) ×2 IMPLANT
DRAPE LAPAROSCOPIC ABDOMINAL (DRAPES) ×2 IMPLANT
ELECT REM PT RETURN 9FT ADLT (ELECTROSURGICAL) ×2
ELECTRODE REM PT RTRN 9FT ADLT (ELECTROSURGICAL) ×1 IMPLANT
GLOVE BIOGEL M 8.0 STRL (GLOVE) ×2 IMPLANT
GOWN STRL REUS W/TWL XL LVL3 (GOWN DISPOSABLE) ×8 IMPLANT
HEMOSTAT SURGICEL 2X14 (HEMOSTASIS) ×2 IMPLANT
HEMOSTAT SURGICEL 4X8 (HEMOSTASIS) IMPLANT
IV CATH AUTO 14GX1.75 SAFE ORG (IV SOLUTION) ×2 IMPLANT
KIT BASIN OR (CUSTOM PROCEDURE TRAY) ×2 IMPLANT
LIQUID BAND (GAUZE/BANDAGES/DRESSINGS) ×2 IMPLANT
POUCH RETRIEVAL ECOSAC 10 (ENDOMECHANICALS) IMPLANT
POUCH RETRIEVAL ECOSAC 10MM (ENDOMECHANICALS)
SCISSORS LAP 5X45 EPIX DISP (ENDOMECHANICALS) ×2 IMPLANT
SCRUB PCMX 4 OZ (MISCELLANEOUS) ×2 IMPLANT
SET IRRIG TUBING LAPAROSCOPIC (IRRIGATION / IRRIGATOR) ×2 IMPLANT
SLEEVE XCEL OPT CAN 5 100 (ENDOMECHANICALS) ×6 IMPLANT
STRIP CLOSURE SKIN 1/2X4 (GAUZE/BANDAGES/DRESSINGS) IMPLANT
SUT VIC AB 4-0 SH 18 (SUTURE) ×2 IMPLANT
SYR 20CC LL (SYRINGE) ×2 IMPLANT
TOWEL OR 17X26 10 PK STRL BLUE (TOWEL DISPOSABLE) ×2 IMPLANT
TRAY LAPAROSCOPIC (CUSTOM PROCEDURE TRAY) ×2 IMPLANT
TROCAR BLADELESS OPT 5 100 (ENDOMECHANICALS) ×2 IMPLANT
TROCAR XCEL BLUNT TIP 100MML (ENDOMECHANICALS) IMPLANT
TROCAR XCEL NON-BLD 11X100MML (ENDOMECHANICALS) ×2 IMPLANT

## 2015-07-13 NOTE — Anesthesia Postprocedure Evaluation (Signed)
  Anesthesia Post-op Note  Patient: [name] [name]  Procedure(s) Performed: Procedure(s) (LRB): LAPAROSCOPIC CHOLECYSTECTOMY WITH INTRAOPERATIVE CHOLANGIOGRAM (N/A)  Patient Location: PACU  Anesthesia Type: General  Level of Consciousness: awake and alert   Airway and Oxygen Therapy: Patient Spontanous Breathing  Post-op Pain: mild  Post-op Assessment: Post-op Vital signs reviewed, Patient's Cardiovascular Status Stable, Respiratory Function Stable, Patent Airway and No signs of Nausea or vomiting  Last Vitals:  Filed Vitals:   07/13/15 1315  BP: 116/76  Pulse: 65  Temp:   Resp: 11    Post-op Vital Signs: stable   Complications: No apparent anesthesia complications

## 2015-07-13 NOTE — Anesthesia Procedure Notes (Signed)
Procedure Name: Intubation Date/Time: 07/13/2015 10:47 AM Performed by: Dimas Millin, Retia Cordle F Pre-anesthesia Checklist: Patient identified, Emergency Drugs available, Suction available, Patient being monitored and Timeout performed Patient Re-evaluated:Patient Re-evaluated prior to inductionOxygen Delivery Method: Circle system utilized Preoxygenation: Pre-oxygenation with 100% oxygen Intubation Type: IV induction Ventilation: Mask ventilation without difficulty Laryngoscope Size: Miller and 2 Grade View: Grade I Tube type: Oral Tube size: 7.0 mm Number of attempts: 1 Airway Equipment and Method: Stylet Placement Confirmation: ETT inserted through vocal cords under direct vision,  positive ETCO2 and breath sounds checked- equal and bilateral Secured at: 22 cm Tube secured with: Tape Dental Injury: Teeth and Oropharynx as per pre-operative assessment

## 2015-07-13 NOTE — Op Note (Signed)
[facility]   07/13/2015  12:58 PM  Procedure: Laparoscopic Cholecystectomy with intraoperative cholangiogram  Surgeon: Catalina Antigua B. Hassell Done, MD, FACS Asst:  Ralene Ok, MD, FACS  Anes:  General  Drains:  None  Findings: Severe chronic cholecystitis with normal IOC  Description of Procedure: The patient was taken to OR 4 and given general anesthesia.  The patient was prepped with PCMX and draped sterilely. A time out was performed.  Access to the abdomen was achieved with a 5 mm Optiview through the left upper quadrant.  Port placement included an 11 mm in the upper midline and three other 5 mm trocars.  Numerous adhesions from the prior nephrectomy were noted below the umbilicus involving the small intestine.  I worked around these as they were not readily amenable to laparoscopic lysis.    The gallbladder was visualized and the fundus was grasped and the gallbladder was elevated. Traction on the infundibulum allowed for successful demonstration of the critical view. Inflammatory changes were severe and chronic.  The GB had many black stones.  The cystic duct was identified and clipped up on the gallbladder and an incision was made in the cystic duct and the Reddick catheter was inserted after milking the cystic duct of any debris. A dynamic cholangiogram was performed which demonstrated a large CBD with free flow into the duodenum.    The cystic duct was then triple clipped and divided, the cystic artery was double clipped and divided and then the gallbladder was removed from the gallbladder bed. Removal of the gallbladder from the gallbladder bed was difficult and there were venous lakes in the GB bed.  The gallbladder was then placed in a bag and brought out through one of the trocar sites. The gallbladder bed was inspected and no bleeding or bile leaks were seen.   Laparoscopic visualization was used when closing fascial defects for trocar sites in the upper midline with an Endoclose  and 0 vicryl.   Incisions were injected with Exparel and closed with 4-0 Vicryl and Liquiban on the skin.  Sponge and needle count were correct.    The patient was taken to the recovery room in satisfactory condition.

## 2015-07-13 NOTE — Transfer of Care (Signed)
Immediate Anesthesia Transfer of Care Note  Patient: [name] [name]  Procedure(s) Performed: Procedure(s): LAPAROSCOPIC CHOLECYSTECTOMY WITH INTRAOPERATIVE CHOLANGIOGRAM (N/A)  Patient Location: PACU  Anesthesia Type:General  Level of Consciousness: awake, alert  and oriented  Airway & Oxygen Therapy: Patient Spontanous Breathing and Patient connected to face mask oxygen  Post-op Assessment: Report given to RN and Post -op Vital signs reviewed and stable  Post vital signs: Reviewed and stable  Last Vitals:  Filed Vitals:   07/13/15 0742  BP: 132/92  Pulse: 63  Temp: 37 C  Resp: 18    Complications: No apparent anesthesia complications

## 2015-07-13 NOTE — H&P (Signed)
Chief Complaint:  gallstones  History of Present Illness:  [name] [name] is an 54 y.o. male who has had a lapband to lose weight to qualify for a renal transplant.  He has developed gallstones which will need to be removed before transplantation can occur.    Past Medical History  Diagnosis Date  . Gout   . PAF (paroxysmal atrial fibrillation)     happens about every 6 years-sees nishan  . Hypertension   . Hyperlipidemia   . Arthritis   . Renal insufficiency     KIDNEY DISEASE - FOLLOWED BY DR. Lorrene Reid NOTES ON CHART  . Cataract     bilateral- right removed, left present  . Enlarged kidney     right  . Dysrhythmia   . Complication of anesthesia     problem with bladder not waking up after surgery-for right nephrectomy    Past Surgical History  Procedure Laterality Date  . Knee surgery  2001  . Av fistula placement      "IT CLOSED UP AND DOES NOT WORK"  . Cataracts removed Right   . Laparoscopic gastric banding N/A 06/20/2014    Procedure: LAPAROSCOPIC GASTRIC BANDING and VENTRAL HERNIA REPAIR WITH MESH;  Surgeon: Pedro Earls, MD;  Location: [facility];  Service: General;  Laterality: N/A;  . Insertion of mesh N/A 06/20/2014    Procedure: INSERTION OF MESH;  Surgeon: Pedro Earls, MD;  Location: [facility];  Service: General;  Laterality: N/A;  . Hernia repair    . Av fistula placement Right 10/27/2014    Procedure: ARTERIOVENOUS (AV) FISTULA CREATION;  Surgeon: Conrad Dunlap, MD;  Location: [facility];  Service: Vascular;  Laterality: Right;  . Total nephrectomy Right   . Eye surgery      left cataract surgery-lens implant    Current Facility-Administered Medications  Medication Dose Route Frequency Provider Last Rate Last Dose  . ceFAZolin (ANCEF) 3 g in dextrose 5 % 50 mL IVPB  3 g Intravenous 30 min Pre-Op Johnathan Hausen, MD       Facility-Administered Medications Ordered in Other Encounters  Medication Dose Route Frequency Provider Last Rate Last Dose  . 0.9 %  sodium  chloride infusion    Continuous PRN Maxwell Caul, CRNA       Review of patient's allergies indicates no known allergies. Family History  Problem Relation Age of Onset  . CAD    . Hypertension Mother   . Cancer Father     bone  . Cancer Sister     breast  . Cancer Sister     breast  . Colon cancer Neg Hx   . Rectal cancer Neg Hx   . Stomach cancer Neg Hx    Social History:   reports that he has never smoked. He has never used smokeless tobacco. He reports that he does not drink alcohol or use illicit drugs.   REVIEW OF SYSTEMS : Negative except for see problem list  Physical Exam:   Blood pressure 132/92, pulse 63, temperature 98.6 F (37 C), temperature source Oral, resp. rate 18, height 5\' 10"  (1.778 m), weight 121.564 kg (268 lb), SpO2 100 %. Body mass index is 38.45 kg/(m^2).  Gen:  WDWN WM NAD  Neurological: Alert and oriented to person, place, and time. Motor and sensory function is grossly intact  Head: Normocephalic and atraumatic.  Eyes: Conjunctivae are normal. Pupils are equal, round, and reactive to light. No scleral icterus.  Neck: Normal range  of motion. Neck supple. No tracheal deviation or thyromegaly present.  Cardiovascular:  SR without murmurs or gallops.  No carotid bruits Breast:  Not examined Respiratory: Effort normal.  No respiratory distress. No chest wall tenderness. Breath sounds normal.  No wheezes, rales or rhonchi.  Abdomen:  Prior nephrectomy and lapband placed  GU:  Not examined Musculoskeletal: Normal range of motion. Extremities are nontender. No cyanosis, edema or clubbing noted Lymphadenopathy: No cervical, preauricular, postauricular or axillary adenopathy is present Skin: Skin is warm and dry. No rash noted. No diaphoresis. No erythema. No pallor. Pscyh: Normal mood and affect. Behavior is normal. Judgment and thought content normal.   LABORATORY RESULTS: No results found for this or any previous visit (from the past 48  hour(s)).   RADIOLOGY RESULTS: No results found.  Problem List: Patient Active Problem List   Diagnosis Date Noted  . Chronic kidney disease, stage V 09/18/2014  . Encounter for adequacy testing for dialysis 09/18/2014  . S/P repair of ventral hernia 06/21/2014  . Lapband APS July 2015 06/20/2014  . Status post gastric banding 06/20/2014  . Morbid obesity BMI 42 01/04/2014  . Hypertension   . Renal disorder   . Gout   . PAF (paroxysmal atrial fibrillation)     Assessment & Plan: Gallstones; for lap chole    Matt B. Hassell Done, MD, [facility] [phone] beeper [phone]  07/13/2015 9:22 AM

## 2015-07-13 NOTE — Anesthesia Preprocedure Evaluation (Addendum)
Anesthesia Evaluation  Patient identified by MRN, date of birth, ID band Patient awake    Reviewed: Allergy & Precautions, H&P , NPO status , Patient's Chart, lab work & pertinent test results  Airway Mallampati: III  TM Distance: >3 FB Neck ROM: Full    Dental  (+) Dental Advisory Given, Edentulous Upper, Missing All right side lower teeth missing:   Pulmonary neg pulmonary ROS,  breath sounds clear to auscultation  Pulmonary exam normal       Cardiovascular hypertension, Pt. on medications and Pt. on home beta blockers Normal cardiovascular exam+ dysrhythmias Atrial Fibrillation Rhythm:Regular Rate:Normal     Neuro/Psych negative neurological ROS  negative psych ROS   GI/Hepatic negative GI ROS, Neg liver ROS,   Endo/Other  Morbid obesity  Renal/GU      Musculoskeletal   Abdominal (+) + obese,   Peds  Hematology negative hematology ROS (+)   Anesthesia Other Findings   Reproductive/Obstetrics                            Anesthesia Physical Anesthesia Plan  ASA: III  Anesthesia Plan: General   Post-op Pain Management:    Induction: Intravenous  Airway Management Planned: Oral ETT  Additional Equipment:   Intra-op Plan:   Post-operative Plan: Extubation in OR  Informed Consent:   Plan Discussed with: Surgeon  Anesthesia Plan Comments:         Anesthesia Quick Evaluation

## 2015-07-14 DIAGNOSIS — K8010 Calculus of gallbladder with chronic cholecystitis without obstruction: Secondary | ICD-10-CM | POA: Diagnosis not present

## 2015-07-14 LAB — COMPREHENSIVE METABOLIC PANEL
ALT: 38 U/L (ref 17–63)
ANION GAP: 9 (ref 5–15)
AST: 53 U/L — ABNORMAL HIGH (ref 15–41)
Albumin: 3.2 g/dL — ABNORMAL LOW (ref 3.5–5.0)
Alkaline Phosphatase: 49 U/L (ref 38–126)
BILIRUBIN TOTAL: 0.8 mg/dL (ref 0.3–1.2)
BUN: 47 mg/dL — ABNORMAL HIGH (ref 6–20)
CALCIUM: 9.1 mg/dL (ref 8.9–10.3)
CO2: 23 mmol/L (ref 22–32)
Chloride: 110 mmol/L (ref 101–111)
Creatinine, Ser: 4.28 mg/dL — ABNORMAL HIGH (ref 0.61–1.24)
GFR calc Af Amer: 16 mL/min — ABNORMAL LOW (ref >60)
GFR calc non Af Amer: 14 mL/min — ABNORMAL LOW (ref >60)
GLUCOSE: 123 mg/dL — AB (ref 65–99)
Potassium: 3.5 mmol/L (ref 3.5–5.1)
Sodium: 142 mmol/L (ref 135–145)
TOTAL PROTEIN: 6.4 g/dL — AB (ref 6.5–8.1)

## 2015-07-14 LAB — CBC
HCT: 34.3 % — ABNORMAL LOW (ref 39.0–52.0)
Hemoglobin: 11.1 g/dL — ABNORMAL LOW (ref 13.0–17.0)
MCH: 29.1 pg (ref 26.0–34.0)
MCHC: 32.4 g/dL (ref 30.0–36.0)
MCV: 89.8 fL (ref 78.0–100.0)
PLATELETS: 165 10*3/uL (ref 150–400)
RBC: 3.82 MIL/uL — ABNORMAL LOW (ref 4.22–5.81)
RDW: 13.4 % (ref 11.5–15.5)
WBC: 9.2 10*3/uL (ref 4.0–10.5)

## 2015-07-14 NOTE — Discharge Instructions (Signed)
Laparoscopic Cholecystectomy Laparoscopic cholecystectomy is surgery to remove the gallbladder. The gallbladder is located in the upper right part of the abdomen, behind the liver. It is a storage sac for bile produced in the liver. Bile aids in the digestion and absorption of fats. Cholecystectomy is often done for inflammation of the gallbladder (cholecystitis). This condition is usually caused by a buildup of gallstones (cholelithiasis) in your gallbladder. Gallstones can block the flow of bile, resulting in inflammation and pain. In severe cases, emergency surgery may be required. When emergency surgery is not required, you will have time to prepare for the procedure. Laparoscopic surgery is an alternative to open surgery. Laparoscopic surgery has a shorter recovery time. Your common bile duct may also need to be examined during the procedure. If stones are found in the common bile duct, they may be removed. LET [facility] CARE PROVIDER KNOW ABOUT:  Any allergies you have.  All medicines you are taking, including vitamins, herbs, eye drops, creams, and over-the-counter medicines.  Previous problems you or members of your family have had with the use of anesthetics.  Any blood disorders you have.  Previous surgeries you have had.  Medical conditions you have. RISKS AND COMPLICATIONS Generally, this is a safe procedure. However, as with any procedure, complications can occur. Possible complications include:  Infection.  Damage to the common bile duct, nerves, arteries, veins, or other internal organs such as the stomach, liver, or intestines.  Bleeding.  A stone may remain in the common bile duct.  A bile leak from the cyst duct that is clipped when your gallbladder is removed.  The need to convert to open surgery, which requires a larger incision in the abdomen. This may be necessary if your surgeon thinks it is not safe to continue with a laparoscopic procedure. BEFORE THE  PROCEDURE  Ask your health care provider about changing or stopping any regular medicines. You will need to stop taking aspirin or blood thinners at least 5 days prior to surgery.  Do not eat or drink anything after midnight the night before surgery.  Let your health care provider know if you develop a cold or other infectious problem before surgery. PROCEDURE   You will be given medicine to make you sleep through the procedure (general anesthetic). A breathing tube will be placed in your mouth.  When you are asleep, your surgeon will make several small cuts (incisions) in your abdomen.  A thin, lighted tube with a tiny camera on the end (laparoscope) is inserted through one of the small incisions. The camera on the laparoscope sends a picture to a TV screen in the operating room. This gives the surgeon a good view inside your abdomen.  A gas will be pumped into your abdomen. This expands your abdomen so that the surgeon has more room to perform the surgery.  Other tools needed for the procedure are inserted through the other incisions. The gallbladder is removed through one of the incisions.  After the removal of your gallbladder, the incisions will be closed with stitches, staples, or skin glue. AFTER THE PROCEDURE  You will be taken to a recovery area where your progress will be checked often.  You may be allowed to go home the same day if your pain is controlled and you can tolerate liquids. Document Released: 11/17/2005 Document Revised: 09/07/2013 Document Reviewed: 06/29/2013 [facility] Patient Information 2015 [place], [place]. This information is not intended to replace advice given to you by your health care provider. Make  sure you discuss any questions you have with your health care provider.   CCS ______[facility] LAPAROSCOPIC SURGERY: POST OP INSTRUCTIONS Always review your discharge instruction sheet given to you by the facility where your surgery was  performed. IF YOU HAVE DISABILITY OR FAMILY LEAVE FORMS, YOU MUST BRING THEM TO THE OFFICE FOR PROCESSING.   DO NOT GIVE THEM TO YOUR DOCTOR.  1. A prescription for pain medication may be given to you upon discharge.  Take your pain medication as prescribed, if needed.  If narcotic pain medicine is not needed, then you may take acetaminophen (Tylenol) or ibuprofen (Advil) as needed. 2. Take your usually prescribed medications unless otherwise directed. 3. If you need a refill on your pain medication, please contact your pharmacy.  They will contact our office to request authorization. Prescriptions will not be filled after 5pm or on week-ends. 4. You should follow a light diet the first few days after arrival home, such as soup and crackers, etc.  Be sure to include lots of fluids daily. 5. Most patients will experience some swelling and bruising in the area of the incisions.  Ice packs will help.  Swelling and bruising can take several days to resolve.  6. It is common to experience some constipation if taking pain medication after surgery.  Increasing fluid intake and taking a stool softener (such as Colace) will usually help or prevent this problem from occurring.  A mild laxative (Milk of Magnesia or Miralax) should be taken according to package instructions if there are no bowel movements after 48 hours. 7. Unless discharge instructions indicate otherwise, you may remove your bandages 24-48 hours after surgery, and you may shower at that time.  You may have steri-strips (small skin tapes) in place directly over the incision.  These strips should be left on the skin for 7-10 days.  If your surgeon used skin glue on the incision, you may shower in 24 hours.  The glue will flake off over the next 2-3 weeks.  Any sutures or staples will be removed at the office during your follow-up visit. 8. ACTIVITIES:  You may resume regular (light) daily activities beginning the next day--such as daily self-care,  walking, climbing stairs--gradually increasing activities as tolerated.  You may have sexual intercourse when it is comfortable.  Refrain from any heavy lifting or straining until approved by your doctor. a. You may drive when you are no longer taking prescription pain medication, you can comfortably wear a seatbelt, and you can safely maneuver your car and apply brakes. b. RETURN TO WORK:  __________________________________________________________ 9. You should see your doctor in the office for a follow-up appointment approximately 2-3 weeks after your surgery.  Make sure that you call for this appointment within a day or two after you arrive home to insure a convenient appointment time. 10. OTHER INSTRUCTIONS: __________________________________________________________________________________________________________________________ __________________________________________________________________________________________________________________________ WHEN TO CALL YOUR DOCTOR: 1. Fever over 101.0 2. Inability to urinate 3. Continued bleeding from incision. 4. Increased pain, redness, or drainage from the incision. 5. Increasing abdominal pain  The clinic staff is available to answer your questions during regular business hours.  Please dont hesitate to call and ask to speak to one of the nurses for clinical concerns.  If you have a medical emergency, go to the nearest emergency room or call 911.  A surgeon from [facility] is always on call at the hospital. [address], [place], [place], [place]  [address] ? [id number],  [place], [place]   [phone] ? [phone] ? FAX [phone] Web site: [email or web address]

## 2015-07-14 NOTE — Progress Notes (Signed)
Discharge instructions discussed with patient and wife until no further questions ask. Iv discontinued. Am assessment unchanged

## 2015-07-14 NOTE — Progress Notes (Signed)
Patient ID: AGUSTUS TOBOLSKI, male   DOB: 11-Sep-1961, 54 y.o.   MRN: [id number] 1 Day Post-Op  Subjective: No complaints this morning. Just mild soreness. Tolerating fluids without nausea. Up walking in the halls.  Objective: Vital signs in last 24 hours: Temp:  [97.7 F (36.5 C)-98.4 F (36.9 C)] 98.3 F (36.8 C) (08/13 0625) Pulse Rate:  [54-84] 66 (08/13 1032) Resp:  [10-21] 18 (08/13 0625) BP: (115-148)/(65-91) 148/89 mmHg (08/13 1032) SpO2:  [95 %-100 %] 98 % (08/13 0625)    Intake/Output from previous day: 08/12 0701 - 08/13 0700 In: 3131.3 [P.O.:850; I.V.:2281.3] Out: 1200 [Urine:1200] Intake/Output this shift:    General appearance: alert, cooperative and no distress GI: normal findings: soft, non-tender Incision/Wound: clean and dry  Lab Results:   Recent Labs  07/13/15 1335 07/14/15 0540  WBC 8.6 9.2  HGB 11.6* 11.1*  HCT 35.4* 34.3*  PLT 171 165   BMET  Recent Labs  07/13/15 1335 07/14/15 0540  NA 142 142  K 4.1 3.5  CL 112* 110  CO2 22 23  GLUCOSE 148* 123*  BUN 50* 47*  CREATININE 4.24* 4.28*  CALCIUM 9.0 9.1     Studies/Results: Dg Cholangiogram Operative  07/13/2015   CLINICAL DATA:  Right upper quadrant abdominal pain. Laparoscopic cholecystectomy.  EXAM: INTRAOPERATIVE CHOLANGIOGRAM  FLUOROSCOPY TIME:  26 seconds  COMPARISON:  Abdominal MRI - 04/03/2014  FINDINGS: Intraoperative cholangiographic images of the right upper abdominal quadrant during laparoscopic cholecystectomy are provided for review.  Surgical clips overlie the expected location of the gallbladder fossa.  Contrast injection demonstrates selective cannulation of the central aspect of the cystic duct.  There is passage of contrast through the central aspect of the cystic duct with filling of a non dilated common bile duct. There is passage of contrast though the CBD and into the descending portion of the duodenum.  There is minimal reflux of injected contrast into the common  hepatic duct and central aspect of the non dilated intrahepatic biliary system.  There are no discrete filling defects within the opacified portions of the biliary system to suggest the presence of choledocholithiasis.  IMPRESSION: No evidence of choledocholithiasis.   Electronically Signed   By: Sandi Mariscal M.D.   On: 07/13/2015 13:07    Anti-infectives: Anti-infectives    Start     Dose/Rate Route Frequency Ordered Stop   07/13/15 0600  ceFAZolin (ANCEF) 3 g in dextrose 5 % 50 mL IVPB     3 g 160 mL/hr over 30 Minutes Intravenous 30 min pre-op 07/12/15 1337 07/13/15 0935      Assessment/Plan: s/p Procedure(s): LAPAROSCOPIC CHOLECYSTECTOMY WITH INTRAOPERATIVE CHOLANGIOGRAM Doing well postoperatively without apparent complication. Renal function stable. Okay for discharge.      Phong Isenberg T 07/14/2015

## 2015-07-16 ENCOUNTER — Encounter (HOSPITAL_COMMUNITY): Payer: Self-pay | Admitting: Surgery

## 2015-07-30 NOTE — Discharge Summary (Signed)
Physician Discharge Summary  Patient ID: [name] [name] MRN: [id number] DOB/AGE: 05/19/1961 54 y.o.  Admit date: 07/13/2015 Discharge date: 07/30/2015  Admission Diagnoses:  Chronic cholecystitis with gallstones-awaiting kidney transplant  Discharge Diagnoses:  same  Active Problems:   Status post laparoscopic cholecystectomy August 2016   Renal failure   Surgery:  Laparoscopic cholecystectomy  Discharged Condition: improved  Hospital Course:   Had surgery.  Difficult due to adhesions.  Kept overnight for observation  Consults: none  Significant Diagnostic Studies: path    Discharge Exam: Blood pressure 148/89, pulse 66, temperature 98.3 F (36.8 C), temperature source Oral, resp. rate 18, height 5\' 10"  (1.778 m), weight 121.564 kg (268 lb), SpO2 98 %. Incisions OK  Disposition: 01-Home or Self Care  Discharge Instructions    Discharge patient    Complete by:  As directed             Medication List    TAKE these medications        amLODipine 10 MG tablet  Commonly known as:  NORVASC  Take 10 mg by mouth every morning.     aspirin EC 81 MG tablet  Take 81 mg by mouth every morning.     atorvastatin 10 MG tablet  Commonly known as:  LIPITOR  Take 10 mg by mouth every morning.     colchicine 0.6 MG tablet  Take 0.6 mg by mouth daily as needed (GOUT FLARE UP).     furosemide 40 MG tablet  Commonly known as:  LASIX  Take 40 mg by mouth every morning.     metoprolol 50 MG tablet  Commonly known as:  LOPRESSOR  Take 25 mg by mouth every morning.     prednisoLONE acetate 1 % ophthalmic suspension  Commonly known as:  PRED FORTE  Place 1 drop into the left eye 3 (three) times daily.     traZODone 100 MG tablet  Commonly known as:  DESYREL  Take 1 tablet by mouth at bedtime as needed for sleep.     ULORIC 80 MG Tabs  Generic drug:  Febuxostat  Take 80 mg by mouth every morning.     VIGAMOX 0.5 % ophthalmic solution  Generic drug:  moxifloxacin   Place 1 drop into the left eye 3 (three) times daily.           Follow-up Information    Follow up with Johnathan Hausen B, MD In 3 weeks.   Specialty:  General Surgery   Contact information:   [address] [place] [address] [phone]       Signed: Pedro Earls 07/30/2015, 7:09 AM

## 2015-08-08 ENCOUNTER — Other Ambulatory Visit: Payer: Self-pay

## 2015-08-08 ENCOUNTER — Telehealth: Payer: Self-pay

## 2015-08-08 NOTE — Telephone Encounter (Signed)
-----   Message from Conrad [place], MD sent at 08/08/2015  8:13 AM EDT ----- Can go ahead and schedule  ----- Message -----    From: Gregery Na, RN    Sent: 08/07/2015   1:00 PM      To: Conrad , MD  Mr. Cassani wife called today wanting to schedule (R) arm B-C AVF for sometime in September. His last OV with you was on 03/09/15. Would you like for pt. to follow-up in the office or can I go ahead and schedule surgery? Also, according to your last office note, he needed to have cholecystectomy prior to AVF. He did have lap. chole. on 07/13/15. Thanks.  Colletta [place]

## 2015-08-10 ENCOUNTER — Encounter (HOSPITAL_COMMUNITY): Payer: 59

## 2015-08-10 ENCOUNTER — Other Ambulatory Visit (HOSPITAL_COMMUNITY): Payer: 59

## 2015-08-14 ENCOUNTER — Ambulatory Visit: Payer: 59 | Admitting: Vascular Surgery

## 2015-08-17 ENCOUNTER — Encounter (HOSPITAL_COMMUNITY): Payer: Self-pay

## 2015-08-17 ENCOUNTER — Encounter (HOSPITAL_COMMUNITY)
Admission: RE | Admit: 2015-08-17 | Discharge: 2015-08-17 | Disposition: A | Discharge status: Home / Self Care | Payer: Commercial Managed Care - HMO | Source: Ambulatory Visit | Attending: Vascular Surgery | Admitting: Vascular Surgery

## 2015-08-17 DIAGNOSIS — N185 Chronic kidney disease, stage 5: Secondary | ICD-10-CM | POA: Insufficient documentation

## 2015-08-17 DIAGNOSIS — Z01818 Encounter for other preprocedural examination: Secondary | ICD-10-CM | POA: Insufficient documentation

## 2015-08-17 NOTE — Pre-Procedure Instructions (Signed)
    KINKADE JACO  08/17/2015      [place] [address] - [place], [address] [facility] [address] [place]  [address] Phone: [phone] Fax: [phone]    Your procedure is scheduled on 08-22-2015   Wednesday .  Report to [facility] Admitting at 6:30 A.M.   Call this number if you have problems the morning of surgery:  [phone]   Remember:  Do not eat food or drink liquids after midnight.   Take these medicines the morning of surgery with A SIP OF WATER amlodipine(Norvasc),atorvastatin(lipitor),Febuyostat(Uloric),metoptolol(Lopressor)   Do not wear jewelry.  Do not wear lotions, powders, or perfumes.  You may not wear deodorant.  Do not shave 48 hours prior to surgery.  Men may shave face and neck.  Do not bring valuables to the hospital.  [facility] is not responsible for any belongings or valuables.  Contacts, dentures or bridgework may not be worn into surgery.    For patients admitted to the hospital, discharge time will be determined by your treatment team.  Patients discharged the day of surgery will not be allowed to drive home.    Special instructions:  See attached sheet "Preparing for Surgery" for instructions on CHG shower  Please read over the following fact sheets that you were given. Pain Booklet and Surgical Site Infection Prevention

## 2015-08-21 MED ORDER — DEXTROSE 5 % IV SOLN
1.5000 g | INTRAVENOUS | Status: AC
  Administered 2015-08-22: 1.5 g via INTRAVENOUS
  Filled 2015-08-21: qty 1.5

## 2015-08-21 NOTE — Anesthesia Preprocedure Evaluation (Addendum)
Anesthesia Evaluation  Patient identified by MRN, date of birth, ID band Patient awake    Reviewed: Allergy & Precautions, NPO status , Patient's Chart, lab work & pertinent test results  History of Anesthesia Complications Negative for: history of anesthetic complications  Airway Mallampati: I  TM Distance: >3 FB Neck ROM: Full    Dental  (+) Edentulous Upper, Edentulous Lower   Pulmonary neg pulmonary ROS,    breath sounds clear to auscultation       Cardiovascular hypertension, Pt. on medications and Pt. on home beta blockers (-) angina+ dysrhythmias Atrial Fibrillation  Rhythm:Regular Rate:Normal  '14 ECHO: EF [id number], grade 2 diastolic dysfunction, mild MR   Neuro/Psych negative neurological ROS     GI/Hepatic negative GI ROS, Neg liver ROS,   Endo/Other  Morbid obesity (s/p gastric band)  Renal/GU ESRFRenal disease (no dialysis yet, K+ 4.5)     Musculoskeletal   Abdominal (+) + obese,   Peds  Hematology negative hematology ROS (+)   Anesthesia Other Findings   Reproductive/Obstetrics                           Anesthesia Physical Anesthesia Plan  ASA: III  Anesthesia Plan: MAC   Post-op Pain Management:    Induction: Intravenous  Airway Management Planned: Simple Face Mask and Natural Airway  Additional Equipment:   Intra-op Plan:   Post-operative Plan:   Informed Consent: I have reviewed the patients History and Physical, chart, labs and discussed the procedure including the risks, benefits and alternatives for the proposed anesthesia with the patient or authorized representative who has indicated his/her understanding and acceptance.   Dental advisory given  Plan Discussed with: CRNA and Surgeon  Anesthesia Plan Comments: (Plan routine monitors, MAC)        Anesthesia Quick Evaluation

## 2015-08-22 ENCOUNTER — Encounter (HOSPITAL_COMMUNITY): Payer: Self-pay | Admitting: Certified Registered Nurse Anesthetist

## 2015-08-22 ENCOUNTER — Ambulatory Visit (HOSPITAL_COMMUNITY)
Admission: RE | Admit: 2015-08-22 | Discharge: 2015-08-22 | Disposition: A | Discharge status: Home / Self Care | Payer: Commercial Managed Care - HMO | Source: Ambulatory Visit | Attending: Vascular Surgery | Admitting: Vascular Surgery

## 2015-08-22 ENCOUNTER — Telehealth: Payer: Self-pay | Admitting: Vascular Surgery

## 2015-08-22 ENCOUNTER — Ambulatory Visit (HOSPITAL_COMMUNITY): Payer: Commercial Managed Care - HMO | Admitting: Anesthesiology

## 2015-08-22 ENCOUNTER — Encounter (HOSPITAL_COMMUNITY)
Admission: RE | Disposition: A | Discharge status: Home / Self Care | Payer: Self-pay | Source: Ambulatory Visit | Attending: Vascular Surgery

## 2015-08-22 DIAGNOSIS — Z6839 Body mass index (BMI) 39.0-39.9, adult: Secondary | ICD-10-CM | POA: Diagnosis not present

## 2015-08-22 DIAGNOSIS — E785 Hyperlipidemia, unspecified: Secondary | ICD-10-CM | POA: Insufficient documentation

## 2015-08-22 DIAGNOSIS — M109 Gout, unspecified: Secondary | ICD-10-CM | POA: Diagnosis not present

## 2015-08-22 DIAGNOSIS — N186 End stage renal disease: Secondary | ICD-10-CM | POA: Insufficient documentation

## 2015-08-22 DIAGNOSIS — N185 Chronic kidney disease, stage 5: Secondary | ICD-10-CM | POA: Diagnosis not present

## 2015-08-22 DIAGNOSIS — M1990 Unspecified osteoarthritis, unspecified site: Secondary | ICD-10-CM | POA: Insufficient documentation

## 2015-08-22 DIAGNOSIS — Z9884 Bariatric surgery status: Secondary | ICD-10-CM | POA: Diagnosis not present

## 2015-08-22 DIAGNOSIS — I480 Paroxysmal atrial fibrillation: Secondary | ICD-10-CM | POA: Insufficient documentation

## 2015-08-22 DIAGNOSIS — I120 Hypertensive chronic kidney disease with stage 5 chronic kidney disease or end stage renal disease: Secondary | ICD-10-CM | POA: Insufficient documentation

## 2015-08-22 HISTORY — PX: AV FISTULA PLACEMENT: SHX1204

## 2015-08-22 LAB — POCT I-STAT 4, (NA,K, GLUC, HGB,HCT)
Glucose, Bld: 105 mg/dL — ABNORMAL HIGH (ref 65–99)
HCT: 34 % — ABNORMAL LOW (ref 39.0–52.0)
Hemoglobin: 11.6 g/dL — ABNORMAL LOW (ref 13.0–17.0)
POTASSIUM: 4.5 mmol/L (ref 3.5–5.1)
SODIUM: 142 mmol/L (ref 135–145)

## 2015-08-22 SURGERY — ARTERIOVENOUS (AV) FISTULA CREATION
Anesthesia: Monitor Anesthesia Care | Site: Arm Upper | Laterality: Right

## 2015-08-22 MED ORDER — THROMBIN 20000 UNITS EX SOLR
CUTANEOUS | Status: AC
  Filled 2015-08-22: qty 20000

## 2015-08-22 MED ORDER — SODIUM CHLORIDE 0.9 % IV SOLN
INTRAVENOUS | Status: DC
  Administered 2015-08-22: 08:00:00 via INTRAVENOUS

## 2015-08-22 MED ORDER — MIDAZOLAM HCL 2 MG/2ML IJ SOLN
INTRAMUSCULAR | Status: AC
  Filled 2015-08-22: qty 4

## 2015-08-22 MED ORDER — PROPOFOL 10 MG/ML IV BOLUS
INTRAVENOUS | Status: AC
  Filled 2015-08-22: qty 20

## 2015-08-22 MED ORDER — LIDOCAINE HCL (PF) 1 % IJ SOLN
INTRAMUSCULAR | Status: AC
  Filled 2015-08-22: qty 30

## 2015-08-22 MED ORDER — OXYCODONE HCL 5 MG PO TABS: 5.0000 mg | ORAL_TABLET | Freq: Four times a day (QID) | ORAL | Status: DC | PRN

## 2015-08-22 MED ORDER — STERILE WATER FOR INJECTION IJ SOLN
INTRAMUSCULAR | Status: AC
  Filled 2015-08-22: qty 10

## 2015-08-22 MED ORDER — EPHEDRINE SULFATE 50 MG/ML IJ SOLN
INTRAMUSCULAR | Status: AC
  Filled 2015-08-22: qty 1

## 2015-08-22 MED ORDER — SODIUM CHLORIDE 0.9 % IV SOLN
INTRAVENOUS | Status: DC | PRN
  Administered 2015-08-22: 09:00:00

## 2015-08-22 MED ORDER — MIDAZOLAM HCL 5 MG/5ML IJ SOLN
INTRAMUSCULAR | Status: DC | PRN
  Administered 2015-08-22: 2 mg via INTRAVENOUS

## 2015-08-22 MED ORDER — ROCURONIUM BROMIDE 50 MG/5ML IV SOLN
INTRAVENOUS | Status: AC
  Filled 2015-08-22: qty 1

## 2015-08-22 MED ORDER — FENTANYL CITRATE (PF) 100 MCG/2ML IJ SOLN
INTRAMUSCULAR | Status: DC | PRN
  Administered 2015-08-22 (×3): 25 ug via INTRAVENOUS

## 2015-08-22 MED ORDER — CHLORHEXIDINE GLUCONATE CLOTH 2 % EX PADS: 6.0000 | MEDICATED_PAD | Freq: Once | CUTANEOUS | Status: DC

## 2015-08-22 MED ORDER — LIDOCAINE HCL (PF) 1 % IJ SOLN
INTRAMUSCULAR | Status: DC | PRN
  Administered 2015-08-22: 5 mL via INTRADERMAL

## 2015-08-22 MED ORDER — FENTANYL CITRATE (PF) 250 MCG/5ML IJ SOLN
INTRAMUSCULAR | Status: AC
  Filled 2015-08-22: qty 5

## 2015-08-22 MED ORDER — SUCCINYLCHOLINE CHLORIDE 20 MG/ML IJ SOLN
INTRAMUSCULAR | Status: AC
  Filled 2015-08-22: qty 1

## 2015-08-22 MED ORDER — 0.9 % SODIUM CHLORIDE (POUR BTL) OPTIME
TOPICAL | Status: DC | PRN
  Administered 2015-08-22: 1000 mL

## 2015-08-22 MED ORDER — LIDOCAINE HCL (CARDIAC) 20 MG/ML IV SOLN
INTRAVENOUS | Status: DC | PRN
  Administered 2015-08-22: 100 mg via INTRAVENOUS

## 2015-08-22 MED ORDER — PROPOFOL INFUSION 10 MG/ML OPTIME
INTRAVENOUS | Status: DC | PRN
  Administered 2015-08-22: 75 ug/kg/min via INTRAVENOUS

## 2015-08-22 SURGICAL SUPPLY — 28 items
ARMBAND PINK RESTRICT EXTREMIT (MISCELLANEOUS) ×2 IMPLANT
CANISTER SUCTION 2500CC (MISCELLANEOUS) ×2 IMPLANT
CLIP TI MEDIUM 6 (CLIP) ×2 IMPLANT
CLIP TI WIDE RED SMALL 6 (CLIP) ×2 IMPLANT
COVER PROBE W GEL 5X96 (DRAPES) ×2 IMPLANT
DECANTER SPIKE VIAL GLASS SM (MISCELLANEOUS) ×2 IMPLANT
ELECT REM PT RETURN 9FT ADLT (ELECTROSURGICAL) ×2
ELECTRODE REM PT RTRN 9FT ADLT (ELECTROSURGICAL) ×1 IMPLANT
GLOVE BIO SURGEON STRL SZ 6.5 (GLOVE) ×4 IMPLANT
GLOVE BIO SURGEON STRL SZ7 (GLOVE) ×2 IMPLANT
GLOVE BIOGEL PI IND STRL 7.5 (GLOVE) ×1 IMPLANT
GLOVE BIOGEL PI INDICATOR 7.5 (GLOVE) ×1
GOWN STRL REUS W/ TWL LRG LVL3 (GOWN DISPOSABLE) ×3 IMPLANT
GOWN STRL REUS W/TWL LRG LVL3 (GOWN DISPOSABLE) ×3
KIT BASIN OR (CUSTOM PROCEDURE TRAY) ×2 IMPLANT
KIT ROOM TURNOVER OR (KITS) ×2 IMPLANT
LIQUID BAND (GAUZE/BANDAGES/DRESSINGS) ×2 IMPLANT
NS IRRIG 1000ML POUR BTL (IV SOLUTION) ×2 IMPLANT
PACK CV ACCESS (CUSTOM PROCEDURE TRAY) ×2 IMPLANT
PAD ARMBOARD 7.5X6 YLW CONV (MISCELLANEOUS) ×4 IMPLANT
SPONGE SURGIFOAM ABS GEL 100 (HEMOSTASIS) IMPLANT
SUT MNCRL AB 4-0 PS2 18 (SUTURE) ×2 IMPLANT
SUT PROLENE 6 0 BV (SUTURE) IMPLANT
SUT PROLENE 7 0 BV 1 (SUTURE) ×2 IMPLANT
SUT VIC AB 3-0 SH 27 (SUTURE) ×1
SUT VIC AB 3-0 SH 27X BRD (SUTURE) ×1 IMPLANT
UNDERPAD 30X30 INCONTINENT (UNDERPADS AND DIAPERS) ×2 IMPLANT
WATER STERILE IRR 1000ML POUR (IV SOLUTION) ×2 IMPLANT

## 2015-08-22 NOTE — Anesthesia Postprocedure Evaluation (Signed)
  Anesthesia Post-op Note  Patient: [name] [name]  Procedure(s) Performed: Procedure(s): BRACHIOCEPHALIC ARTERIOVENOUS (AV) FISTULA CREATION (Right)  Patient Location: PACU  Anesthesia Type:MAC  Level of Consciousness: awake, alert , oriented and patient cooperative  Airway and Oxygen Therapy: Patient Spontanous Breathing  Post-op Pain: none  Post-op Assessment: Post-op Vital signs reviewed, Patient's Cardiovascular Status Stable, Respiratory Function Stable, Patent Airway, No signs of Nausea or vomiting and Pain level controlled              Post-op Vital Signs: Reviewed and stable  Last Vitals:  Filed Vitals:   08/22/15 1104  BP: 114/74  Pulse: 59  Temp:   Resp:     Complications: No apparent anesthesia complications

## 2015-08-22 NOTE — H&P (Signed)
Brief History and Physical  History of Present Illness  [name] [name] is a 54 y.o. male who presents with chief complaint: ESRD.  The patient presents today for R BC AVF.  Pt's prior R RC AVF thrombosed.  His next option was a R BC AVF.    Past Medical History  Diagnosis Date  . Gout   . PAF (paroxysmal atrial fibrillation)     happens about every 6 years-sees nishan  . Hypertension   . Hyperlipidemia   . Arthritis   . Renal insufficiency     KIDNEY DISEASE - FOLLOWED BY DR. Lorrene Reid NOTES ON CHART  . Cataract     bilateral- right removed, left present  . Enlarged kidney     right  . Dysrhythmia   . Complication of anesthesia     problem with bladder not waking up after surgery-for right nephrectomy    Past Surgical History  Procedure Laterality Date  . Knee surgery  2001  . Av fistula placement      "IT CLOSED UP AND DOES NOT WORK"  . Cataracts removed Right   . Laparoscopic gastric banding N/A 06/20/2014    Procedure: LAPAROSCOPIC GASTRIC BANDING and VENTRAL HERNIA REPAIR WITH MESH;  Surgeon: Pedro Earls, MD;  Location: [facility];  Service: General;  Laterality: N/A;  . Insertion of mesh N/A 06/20/2014    Procedure: INSERTION OF MESH;  Surgeon: Pedro Earls, MD;  Location: [facility];  Service: General;  Laterality: N/A;  . Hernia repair    . Av fistula placement Right 10/27/2014    Procedure: ARTERIOVENOUS (AV) FISTULA CREATION;  Surgeon: Conrad Maurice, MD;  Location: [facility];  Service: Vascular;  Laterality: Right;  . Total nephrectomy Right   . Eye surgery      left cataract surgery-lens implant  . Cholecystectomy N/A 07/13/2015    Procedure: LAPAROSCOPIC CHOLECYSTECTOMY WITH INTRAOPERATIVE CHOLANGIOGRAM;  Surgeon: Johnathan Hausen, MD;  Location: [facility];  Service: General;  Laterality: N/A;    Social History   Social History  . Marital Status: Married    Spouse Name: N/A  . Number of Children: N/A  . Years of Education: N/A   Occupational History  .  Not on file.   Social History Main Topics  . Smoking status: Never Smoker   . Smokeless tobacco: Never Used  . Alcohol Use: No  . Drug Use: No  . Sexual Activity: Not on file   Other Topics Concern  . Not on file   Social History Narrative    Family History  Problem Relation Age of Onset  . CAD    . Hypertension Mother   . Cancer Father     bone  . Cancer Sister     breast  . Cancer Sister     breast  . Colon cancer Neg Hx   . Rectal cancer Neg Hx   . Stomach cancer Neg Hx     No current facility-administered medications on file prior to encounter.   Current Outpatient Prescriptions on File Prior to Encounter  Medication Sig Dispense Refill  . amLODipine (NORVASC) 10 MG tablet Take 10 mg by mouth every morning.     Marland Kitchen aspirin EC 81 MG tablet Take 81 mg by mouth every morning.     Marland Kitchen atorvastatin (LIPITOR) 10 MG tablet Take 10 mg by mouth every morning.     . colchicine 0.6 MG tablet Take 0.6 mg by mouth daily as needed (GOUT  FLARE UP).     . Febuxostat (ULORIC) 80 MG TABS Take 80 mg by mouth every morning.     . furosemide (LASIX) 40 MG tablet Take 40 mg by mouth every morning.     . metoprolol (LOPRESSOR) 50 MG tablet Take 25 mg by mouth every morning.     . traZODone (DESYREL) 100 MG tablet Take 100 mg by mouth at bedtime as needed for sleep.   3    No Known Allergies  Review of Systems: As listed above, otherwise negative.  Physical Examination  Filed Vitals:   08/22/15 0716  BP: 131/92  Pulse: 53  Temp: 98.2 F (36.8 C)  TempSrc: Oral  Resp: 20  Weight: 271 lb (122.925 kg)  SpO2: 100%    General: A&O x 3, WDWN  Pulmonary: Sym exp, good air movt, CTAB, no rales, rhonchi, & wheezing  Cardiac: RRR, Nl S1, S2, no Murmurs, rubs or gallops  Gastrointestinal: soft, NTND, -G/R, - HSM, - masses, - CVAT B  Musculoskeletal: M/S 5/5 throughout , Extremities without ischemic changes , R wrist inc healed, no bruit, not thrill  Laboratory See  iStat  Medical Decision Making  [name] [name] is a 54 y.o. male who presents with: ESRD.   The patient is scheduled for: R BCAVF  Risk, benefits, and alternatives to access surgery were discussed.  The patient is aware the risks include but are not limited to: bleeding, infection, steal syndrome, nerve damage, ischemic monomelic neuropathy, failure to mature, and need for additional procedures.  The patient is aware of the risks and agrees to proceed.  Adele Barthel, MD [facility] Office: [phone] Pager: [phone]  08/22/2015, 8:06 AM

## 2015-08-22 NOTE — Telephone Encounter (Signed)
-----   Message from Gabriel Earing, [place] sent at 08/22/2015  9:46 AM EDT ----- S/p right BC AVF 08/22/15.  Fu with Bridgett Larsson in 6 weeks.  No duplex.

## 2015-08-22 NOTE — Transfer of Care (Signed)
Immediate Anesthesia Transfer of Care Note  Patient: [name] [name]  Procedure(s) Performed: Procedure(s): BRACHIOCEPHALIC ARTERIOVENOUS (AV) FISTULA CREATION (Right)  Patient Location: PACU  Anesthesia Type:MAC  Level of Consciousness: awake, alert  and oriented  Airway & Oxygen Therapy: Patient Spontanous Breathing and Patient connected to face mask oxygen  Post-op Assessment: Report given to RN and Post -op Vital signs reviewed and stable  Post vital signs: Reviewed and stable  Last Vitals:  Filed Vitals:   08/22/15 0716  BP: 131/92  Pulse: 53  Temp: 36.8 C  Resp: 20    Complications: No apparent anesthesia complications

## 2015-08-22 NOTE — Telephone Encounter (Signed)
LM for pt re appt, dpm °

## 2015-08-22 NOTE — Op Note (Addendum)
OPERATIVE NOTE   PROCEDURE: Right brachiocephalic arteriovenous fistula placement  PRE-OPERATIVE DIAGNOSIS: end stage renal disease   POST-OPERATIVE DIAGNOSIS: same as above   SURGEON: Adele Barthel, MD  ASSISTANT(S): Leontine Locket, PAC   ANESTHESIA: local and MAC  ESTIMATED BLOOD LOSS: 50 cc  FINDING(S): 1.  Palpable thrill at end of case 2.  Palpable radial pulse at end of case  SPECIMEN(S):  none  INDICATIONS:   [name] [name] is a 54 y.o. male who presents with end stage renal disease .  The patient is scheduled for right brachiocephalic arteriovenous fistula placement.  The patient is aware the risks include but are not limited to: bleeding, infection, steal syndrome, nerve damage, ischemic monomelic neuropathy, failure to mature, and need for additional procedures.  The patient is aware of the risks of the procedure and elects to proceed forward.  DESCRIPTION: After full informed written consent was obtained from the patient, the patient was brought back to the operating room and placed supine upon the operating table.  Prior to induction, the patient received IV antibiotics.   After obtaining adequate anesthesia, the patient was then prepped and draped in the standard fashion for a right arm access procedure.  I turned my attention first to identifying the patient's cephalic vein and brachial artery.  Using SonoSite guidance, the location of these vessels were marked out on the skin.   At this point, I injected local anesthetic to obtain a field block of the antecubitum.  In total, I injected about 5 mL of 1% lidocaine without epinephrine.  I made a transverse incision at the level of the antecubitum and dissected through the subcutaneous tissue and fascia to gain exposure of the brachial artery.  This was noted to be 4 mm in diameter externally.  This was dissected out proximally and distally and controlled with vessel loops .  I then dissected out the cephalic vein.  This  was noted to be 4-5 mm in diameter externally.  The distal segment of the vein was ligated with a  2-0 silk, and the vein was transected.  The proximal segment was interrogated with serial dilators.  The vein accepted up to a 5 mm dilator without any difficulty.  I then instilled the heparinized saline into the vein and clamped it.  At this point, I reset my exposure of the brachial artery and placed the artery under tension proximally and distally.  I made an arteriotomy with a #11 blade, and then I extended the arteriotomy with a Potts scissor.  I injected heparinized saline proximal and distal to this arteriotomy.  The vein was then sewn to the artery in an end-to-side configuration with a running stitch of 7-0 Prolene.  Prior to completing this anastomosis, I allowed the vein and artery to backbleed.  There was no evidence of clot from any vessels.  I completed the anastomosis in the usual fashion and then released all vessel loops and clamps.  There was a palpable thrill in the venous outflow, and there was a palpable radial pulse.  At this point, I irrigated out the surgical wound.  There was no further active bleeding.  The subcutaneous tissue was reapproximated with a running stitch of 3-0 Vicryl.  The skin was then reapproximated with a running subcuticular stitch of 4-0 Vicryl.  The skin was then cleaned, dried, and reinforced with Dermabond.  The patient tolerated this procedure well.    COMPLICATIONS: none  CONDITION: stable   Adele Barthel, MD [facility] Office: [phone] Pager: [phone]  08/22/2015, 9:55 AM

## 2015-08-22 NOTE — Discharge Instructions (Signed)
° ° °  08/22/2015 [name] [name] [id number] November 19, 1961  Surgeon(s): Conrad Keewatin, MD  Procedure(s): BRACHIOCEPHALIC ARTERIOVENOUS (AV) FISTULA CREATION  x Do not stick fistula for 12 weeks

## 2015-08-23 ENCOUNTER — Encounter (HOSPITAL_COMMUNITY): Payer: Self-pay | Admitting: Vascular Surgery

## 2015-09-19 ENCOUNTER — Telehealth: Payer: Self-pay

## 2015-09-19 NOTE — Telephone Encounter (Signed)
Phone call from pt's spouse.  Reported pt. Has a hard knot at one end of left arm incision that is approx. size of peanut. Reported last week the pt. admitted to picking off a scab, and expressed "pus" out of the incision.  Stated that he is c/o soreness in area of knot.  Denied any fever/ chills.  Reported there is a "pink color" to the incision.  Denied redness or warmth.   Appt given for 9:00 AM 09/21/15.  Agreed with plan.  Advised to keep incision clean and dry.  Verb. Understanding.

## 2015-09-20 ENCOUNTER — Ambulatory Visit (INDEPENDENT_AMBULATORY_CARE_PROVIDER_SITE_OTHER): Payer: Commercial Managed Care - HMO | Admitting: Family

## 2015-09-20 ENCOUNTER — Encounter: Payer: Self-pay | Admitting: Vascular Surgery

## 2015-09-20 ENCOUNTER — Encounter: Payer: Self-pay | Admitting: Family

## 2015-09-20 VITALS — BP 144/90 | HR 81 | Ht 70.0 in | Wt 280.5 lb

## 2015-09-20 DIAGNOSIS — I770 Arteriovenous fistula, acquired: Secondary | ICD-10-CM

## 2015-09-20 DIAGNOSIS — T814XXA Infection following a procedure, initial encounter: Secondary | ICD-10-CM

## 2015-09-20 DIAGNOSIS — IMO0001 Reserved for inherently not codable concepts without codable children: Secondary | ICD-10-CM

## 2015-09-20 DIAGNOSIS — N184 Chronic kidney disease, stage 4 (severe): Secondary | ICD-10-CM

## 2015-09-20 MED ORDER — CEPHALEXIN 500 MG PO CAPS: 500.0000 mg | ORAL_CAPSULE | Freq: Three times a day (TID) | ORAL | Status: DC

## 2015-09-20 NOTE — Progress Notes (Signed)
    Postoperative Access Visit   History of Present Illness  [name] [name] is a 54 y.o. year old male patient of Dr. Bridgett Larsson who is s/p right brachiocephalic AVF [id number].  He returns today with c/o hard knot at the medial aspect of the AVF incision, and that he expressed pus from the knot. He denies fever or chills. He denies steal symptoms in his right hand: no tingling, numbness, or cold sensation.   The patient is right hand dominant. Previous access procedures have been completed in the left and right arm. The patient's complication from previous access procedures include: thrombosis. He recently was hospitalized for a R nephrectomy. His RC AVF had been functioning well when it acutely thrombosed during that hospitalization. The patient is in the process of evaluation for a kidney transplant.  His GFR was 16 on 08/02/15 (review of records) which is stage 4, not quite stage 5, CKD.  He is not yet on hemodialysis.  The patient's right upper arm AVF incision edges are well proximated. There is a 7 mm x 6 mm raised semi hard area at the medial end of the incision; when expressed there is scant blood oozing, no purulence. The raised area is pink, no erythema.  The patient is able to complete their activities of daily living.    For VQI Use Only  PRE-ADM LIVING: Home  AMB STATUS: Ambulatory  Physical Examination Filed Vitals:   09/20/15 0849 09/20/15 0850  BP: 150/95 144/90  Pulse: 81   Height: 5\' 10"  (1.778 m)   Weight: 280 lb 8 oz (127.234 kg)   SpO2: 100%    Body mass index is 40.25 kg/(m^2).   General: A&O x 3, WD, WN  Pulmonary: Sym exp, good air movt, CTAB, no rales, rhonchi, & wheezing  Cardiac: RRR, Nl S1, S2, no Murmurs, rubs or gallops  Vascular: Vessel Right Left  Radial Palpable Palpable  Ulnar Faintly Palpable Faintly Palpable  Brachial Palpable Palpable   Gastrointestinal: soft, NTND, -G/R, - HSM, - masses, - CVAT B  Musculoskeletal:  M/S 5/5 throughout , Extremities without ischemic changes , no palpable thrill in access in R arm, no bruit in access  Neurologic: Pain and light touch intact in extremities , Motor exam as listed above         RUE: Incision is healed, skin feels normal warm temperature, hand grip is 5/5, sensation in digits is intact, palpable thrill, bruit can be auscultated. The 6 mm x 7 mm raised area at the medial end of the incision appears to be an absorbable suture abscess, no suture is visualized.  Medical Decision Making  [name] [name] is a 54 y.o. year old male who presents s/p right brachiocephalic AVF [id number]. The raised area on his incision is not draining, appears to be a suture abscess which is a local irritation to the presence of a suture. Will place on 10 days of Keflex, 500 mg tid. Antibiotics have anti inflammatory properties in addition to anti infective.  Follow up with Dr. Bridgett Larsson as already scheduled on 10/12/15.   NICKEL, Sharmon Leyden, RN, MSN, FNP-C [facility] Office: [phone]  09/20/2015, 9:06 AM  Clinic MD: One[facility] Alar

## 2015-09-20 NOTE — Telephone Encounter (Signed)
Error in documenting the laterality of pt's procedure and symptoms; s/p Right BC AVF placement of 08/22/15; previous documentation indicated left side, which is incorrect.

## 2015-09-21 ENCOUNTER — Ambulatory Visit: Payer: 59 | Admitting: Vascular Surgery

## 2015-10-09 ENCOUNTER — Encounter: Payer: Self-pay | Admitting: Vascular Surgery

## 2015-10-12 ENCOUNTER — Encounter: Payer: Self-pay | Admitting: Vascular Surgery

## 2015-10-12 ENCOUNTER — Ambulatory Visit (INDEPENDENT_AMBULATORY_CARE_PROVIDER_SITE_OTHER): Payer: Commercial Managed Care - HMO | Admitting: Vascular Surgery

## 2015-10-12 VITALS — BP 130/81 | HR 81 | Ht 70.0 in | Wt 277.0 lb

## 2015-10-12 DIAGNOSIS — N185 Chronic kidney disease, stage 5: Secondary | ICD-10-CM

## 2015-10-12 NOTE — Progress Notes (Signed)
    Postoperative Access Visit   History of Present Illness  [name] [name] is a 54 y.o. year old male who presents for postoperative follow-up for: R BCAVF (Date: 08/22/15).  The patient's wounds are now healed.  The patient notes no steal symptoms.  The patient is able to complete their activities of daily living.  The patient's current symptoms are: none.  For VQI Use Only  PRE-ADM LIVING: Home  AMB STATUS: Ambulatory  Physical Examination Filed Vitals:   10/12/15 0836  BP: 130/81  Pulse:     RUE: Incision is healed, skin intact, area of prior stitch abscess resolved, skin feels warm, hand grip is 5/5, sensation in digits is intact, palpable thrill, bruit can be auscultated   Medical Decision Making  [name] [name] is a 54 y.o. year old male who presents s/p R BC AVF.  The patient's access is ready for use.  Thank you for allowing [place] to participate in this patient's care.  Adele Barthel, MD [facility] [place] [facility] Office: [phone] Pager: [phone]  10/12/2015, 8:48 AM

## 2015-12-07 ENCOUNTER — Encounter: Payer: Self-pay | Admitting: Cardiovascular Disease

## 2016-01-04 ENCOUNTER — Telehealth: Payer: Self-pay | Admitting: Cardiovascular Disease

## 2016-01-04 NOTE — Telephone Encounter (Signed)
Called patient's spouse (DPR). Patient is afraid to take NM stress test, because he thinks it will hurt him. Patient is having stress done at [facility] and was ordered from the Kidney transplant doctor. Informed patient and his spouse that the doctors there at [facility] would not have ordered the test if they felt like it was unsafe for patient to have done. Patient wants to wait and see Richardson Dopp PA next week before he does stress test.

## 2016-01-04 NOTE — Telephone Encounter (Signed)
New message   Pt is on a transplant list and pt is due for a NUC Stress test  Pt is not sure its safe enough

## 2016-01-09 NOTE — Progress Notes (Signed)
Cardiology Office Note:    Date:  01/10/2016   ID:  [name] [name], DOB 06-25-1961, MRN [id number]  PCP:  Pcp Not In System  Cardiologist:  Dr. Jenkins Rouge   Electrophysiologist:  N/a Nephrologist:  Dr. Lorrene Reid  Chief Complaint  Patient presents with  . Atrial Fibrillation    Follow-up     History of Present Illness:     [name] [name] is a 55 y.o. male with a hx of CKD/ESRD, HTN, PAF, HL.  Reported normal LHC in 2003.  He is being evaluated for renal transplant at [facility].  Echo 1/16 at [facility] was normal. ETT-Echo at submax exercise was neg for ischemic changes.  Still on transplant list.  He needs another nuclear stress test.  He is doing well. The patient denies chest pain, shortness of breath, syncope, orthopnea, PND or significant pedal edema.    Past Medical History  Diagnosis Date  . Gout   . PAF (paroxysmal atrial fibrillation) (HCC)     happens about every 6 years-sees nishan  . Hypertension   . Hyperlipidemia   . Arthritis   . Renal insufficiency     KIDNEY DISEASE - FOLLOWED BY DR. Lorrene Reid NOTES ON CHART  . Cataract     bilateral- right removed, left present  . Enlarged kidney     right  . Dysrhythmia   . Complication of anesthesia     problem with bladder not waking up after surgery-for right nephrectomy    Past Surgical History  Procedure Laterality Date  . Knee surgery  2001  . Av fistula placement      "IT CLOSED UP AND DOES NOT WORK"  . Cataracts removed Right   . Laparoscopic gastric banding N/A 06/20/2014    Procedure: LAPAROSCOPIC GASTRIC BANDING and VENTRAL HERNIA REPAIR WITH MESH;  Surgeon: Pedro Earls, MD;  Location: [facility];  Service: General;  Laterality: N/A;  . Insertion of mesh N/A 06/20/2014    Procedure: INSERTION OF MESH;  Surgeon: Pedro Earls, MD;  [facility];  Service: General;  Laterality: N/A;  . Hernia repair    . Av fistula placement Right 10/27/2014    Procedure: ARTERIOVENOUS (AV) FISTULA CREATION;  Surgeon:  Conrad Cedartown, MD;  Location: [place];  Service: Vascular;  Laterality: Right;  . Total nephrectomy Right   . Eye surgery      left cataract surgery-lens implant  . Cholecystectomy N/A 07/13/2015    Procedure: LAPAROSCOPIC CHOLECYSTECTOMY WITH INTRAOPERATIVE CHOLANGIOGRAM;  Surgeon: Johnathan Hausen, MD;  [facility];  Service: General;  Laterality: N/A;  . Av fistula placement Right 08/22/2015    Procedure: BRACHIOCEPHALIC ARTERIOVENOUS (AV) FISTULA CREATION;  Surgeon: Conrad So-Hi, MD;  Location: [place];  Service: Vascular;  Laterality: Right;    Current Medications: Outpatient Prescriptions Prior to Visit  Medication Sig Dispense Refill  . amLODipine (NORVASC) 10 MG tablet Take 10 mg by mouth every morning.     Marland Kitchen aspirin EC 81 MG tablet Take 81 mg by mouth every morning.     Marland Kitchen atorvastatin (LIPITOR) 10 MG tablet Take 10 mg by mouth every morning.     . colchicine 0.6 MG tablet Take 0.6 mg by mouth daily as needed (GOUT FLARE UP).     . Febuxostat (ULORIC) 80 MG TABS Take 80 mg by mouth every morning.     . furosemide (LASIX) 40 MG tablet Take 40 mg by mouth every morning.     . metoprolol (  LOPRESSOR) 50 MG tablet Take 25 mg by mouth every morning.     . traZODone (DESYREL) 100 MG tablet Take 100 mg by mouth at bedtime as needed for sleep.   3  . cephALEXin (KEFLEX) 500 MG capsule Take 1 capsule (500 mg total) by mouth 3 (three) times daily. (Patient not taking: Reported on 01/10/2016) 30 capsule 0  . oxyCODONE (ROXICODONE) 5 MG immediate release tablet Take 1 tablet (5 mg total) by mouth every 6 (six) hours as needed. (Patient not taking: Reported on 09/20/2015) 20 tablet 0   No facility-administered medications prior to visit.     Allergies:   Review of patient's allergies indicates no known allergies.   Social History   Social History  . Marital Status: Married    Spouse Name: N/A  . Number of Children: N/A  . Years of Education: N/A   Social History Main Topics  . Smoking  status: Never Smoker   . Smokeless tobacco: Never Used  . Alcohol Use: No  . Drug Use: No  . Sexual Activity: Not Asked   Other Topics Concern  . None   Social History Narrative     Family History:  The patient's family history includes Cancer in his father, sister, and sister; Heart disease in his mother; Hypertension in his mother. There is no history of Colon cancer, Rectal cancer, or Stomach cancer.   ROS:   Please see the history of present illness.    ROS All other systems reviewed and are negative.   Physical Exam:    VS:  BP 138/70 mmHg  Pulse 66  Ht 5\' 10"  (1.778 m)  Wt 279 lb 1.9 oz (126.608 kg)  BMI 40.05 kg/m2   GEN: Well nourished, well developed, in no acute distress HEENT: normal Neck: no JVD, no masses Cardiac: Normal [id number], RRR; 1/6 systolic murmur RUSB (radiation from fistula vs aortic sclerosis), trace bilateral LE edema Respiratory:  clear to auscultation bilaterally; no wheezing, rhonchi or rales GI: soft, nontender MS: no deformity or atrophy Skin: warm and dry, no rash Neuro: no focal deficits  Psych: Alert and oriented x 3, normal affect  Wt Readings from Last 3 Encounters:  01/10/16 279 lb 1.9 oz (126.608 kg)  10/12/15 277 lb (125.646 kg)  09/20/15 280 lb 8 oz (127.234 kg)      Studies/Labs Reviewed:     EKG:  EKG is   ordered today.  The ekg ordered today demonstrates NSR, HR 65, normal axis, QTc 468 ms  Recent Labs: 07/14/2015: ALT 38; BUN 47*; Creatinine, Ser 4.28*; Platelets 165 08/22/2015: Hemoglobin 11.6*; Potassium 4.5; Sodium 142   Recent Lipid Panel No results found for: CHOL, TRIG, HDL, CHOLHDL, VLDL, LDLCALC, LDLDIRECT  Additional studies/ records that were reviewed today include:   ETT-Echo ([facility]) 1/16 Negative ETT-Echo at submaximal HR  Echo ([facility]) 1/16 EF 50-55%, mild BAE, Ao sclerosis, mild MR, trace TR  Echo 11/14 Mild LVH, EF [id number], grade 2 diastolic dysfunction, mild MR, mild LAE   ASSESSMENT:     1.  Paroxysmal atrial fibrillation (HCC)   2. Chronic kidney disease, stage IV (severe) ([place])   3. Hyperlipidemia   4. Essential hypertension      PLAN:     In order of problems listed above:  1. PAF - Maintaining NSR.  He has wanted to avoid coumadin in the past.    2. CKD - On transplant list. Recent Creatinine 5.2.  He was concerned about have a stress test. I  discussed the test with him today.  He will have this at [facility].   3. HL - Managed by Neph.  4. HTN - Controlled.  Managed by Neph.    Medication Adjustments/Labs and Tests Ordered: Current medicines are reviewed at length with the patient today.  Concerns regarding medicines are outlined above.  Medication changes, Labs and Tests ordered today are outlined in the Patient Instructions noted below. Patient Instructions  Medication Instructions:  Your physician recommends that you continue on your current medications as directed. Please refer to the Current Medication list given to you today.  Labwork: NONE  Testing/Procedures: NONE  Follow-Up: Your physician wants you to follow-up in: [place] DR. Johnsie Cancel You will receive a reminder letter in the mail two months in advance. If you don't receive a letter, please call our office to schedule the follow-up appointment.  Any Other Special Instructions Will Be Listed Below (If Applicable).  If you need a refill on your cardiac medications before your next appointment, please call your pharmacy.      Signed, Richardson Dopp, PA-C  01/10/2016 5:32 PM    [facility] [address], [place], [place]  [address] Phone: [phone]; Fax: [phone]

## 2016-01-10 ENCOUNTER — Ambulatory Visit (INDEPENDENT_AMBULATORY_CARE_PROVIDER_SITE_OTHER): Payer: Commercial Managed Care - HMO | Admitting: Physician Assistant

## 2016-01-10 ENCOUNTER — Encounter: Payer: Self-pay | Admitting: Physician Assistant

## 2016-01-10 VITALS — BP 138/70 | HR 66 | Ht 70.0 in | Wt 279.1 lb

## 2016-01-10 DIAGNOSIS — E785 Hyperlipidemia, unspecified: Secondary | ICD-10-CM

## 2016-01-10 DIAGNOSIS — I10 Essential (primary) hypertension: Secondary | ICD-10-CM

## 2016-01-10 DIAGNOSIS — I480 Paroxysmal atrial fibrillation: Secondary | ICD-10-CM

## 2016-01-10 DIAGNOSIS — N184 Chronic kidney disease, stage 4 (severe): Secondary | ICD-10-CM | POA: Diagnosis not present

## 2016-01-10 NOTE — Patient Instructions (Addendum)

## 2016-02-18 ENCOUNTER — Telehealth: Payer: Self-pay | Admitting: Cardiovascular Disease

## 2016-02-18 NOTE — Telephone Encounter (Signed)
New Message  Pt has had a nuc stress test completed at [facility]. Have we received the results. Please call back to discuss them.

## 2016-02-19 NOTE — Telephone Encounter (Signed)
Left message to call back. No records received at this time for a Nuc Stress Test form [facility].

## 2016-02-27 NOTE — Telephone Encounter (Signed)
Received records from [facility]. Records have been scanned into patient's chart.

## 2016-11-27 ENCOUNTER — Inpatient Hospital Stay (HOSPITAL_COMMUNITY)
Admission: EM | Admit: 2016-11-27 | Discharge: 2016-12-03 | DRG: 308 | Disposition: A | Discharge status: Home / Self Care | Payer: Commercial Managed Care - HMO | Attending: Internal Medicine | Admitting: Internal Medicine

## 2016-11-27 ENCOUNTER — Encounter (HOSPITAL_COMMUNITY): Payer: Self-pay | Admitting: *Deleted

## 2016-11-27 DIAGNOSIS — N186 End stage renal disease: Secondary | ICD-10-CM | POA: Diagnosis present

## 2016-11-27 DIAGNOSIS — Z7682 Awaiting organ transplant status: Secondary | ICD-10-CM

## 2016-11-27 DIAGNOSIS — I4891 Unspecified atrial fibrillation: Secondary | ICD-10-CM | POA: Diagnosis present

## 2016-11-27 DIAGNOSIS — Z992 Dependence on renal dialysis: Secondary | ICD-10-CM

## 2016-11-27 DIAGNOSIS — R002 Palpitations: Secondary | ICD-10-CM | POA: Diagnosis not present

## 2016-11-27 DIAGNOSIS — Z7982 Long term (current) use of aspirin: Secondary | ICD-10-CM

## 2016-11-27 DIAGNOSIS — I480 Paroxysmal atrial fibrillation: Principal | ICD-10-CM | POA: Diagnosis present

## 2016-11-27 DIAGNOSIS — I5032 Chronic diastolic (congestive) heart failure: Secondary | ICD-10-CM

## 2016-11-27 DIAGNOSIS — I10 Essential (primary) hypertension: Secondary | ICD-10-CM | POA: Diagnosis present

## 2016-11-27 DIAGNOSIS — Z8249 Family history of ischemic heart disease and other diseases of the circulatory system: Secondary | ICD-10-CM

## 2016-11-27 DIAGNOSIS — D649 Anemia, unspecified: Secondary | ICD-10-CM | POA: Diagnosis present

## 2016-11-27 DIAGNOSIS — I132 Hypertensive heart and chronic kidney disease with heart failure and with stage 5 chronic kidney disease, or end stage renal disease: Secondary | ICD-10-CM | POA: Diagnosis present

## 2016-11-27 DIAGNOSIS — Z79899 Other long term (current) drug therapy: Secondary | ICD-10-CM

## 2016-11-27 DIAGNOSIS — Z9884 Bariatric surgery status: Secondary | ICD-10-CM

## 2016-11-27 DIAGNOSIS — I248 Other forms of acute ischemic heart disease: Secondary | ICD-10-CM | POA: Diagnosis present

## 2016-11-27 DIAGNOSIS — E876 Hypokalemia: Secondary | ICD-10-CM | POA: Diagnosis present

## 2016-11-27 DIAGNOSIS — E785 Hyperlipidemia, unspecified: Secondary | ICD-10-CM | POA: Diagnosis present

## 2016-11-27 HISTORY — DX: Chronic diastolic (congestive) heart failure: I50.32

## 2016-11-27 LAB — COMPREHENSIVE METABOLIC PANEL
ALK PHOS: 56 U/L (ref 38–126)
ALT: 26 U/L (ref 17–63)
AST: 26 U/L (ref 15–41)
Albumin: 3.9 g/dL (ref 3.5–5.0)
Anion gap: 14 (ref 5–15)
BUN: 22 mg/dL — ABNORMAL HIGH (ref 6–20)
CALCIUM: 9.2 mg/dL (ref 8.9–10.3)
CHLORIDE: 98 mmol/L — AB (ref 101–111)
CO2: 23 mmol/L (ref 22–32)
CREATININE: 5.43 mg/dL — AB (ref 0.61–1.24)
GFR, EST AFRICAN AMERICAN: 12 mL/min — AB (ref >60)
GFR, EST NON AFRICAN AMERICAN: 11 mL/min — AB (ref >60)
Glucose, Bld: 106 mg/dL — ABNORMAL HIGH (ref 65–99)
Potassium: 3.1 mmol/L — ABNORMAL LOW (ref 3.5–5.1)
Sodium: 135 mmol/L (ref 135–145)
TOTAL PROTEIN: 7.3 g/dL (ref 6.5–8.1)
Total Bilirubin: 1 mg/dL (ref 0.3–1.2)

## 2016-11-27 LAB — CBC WITH DIFFERENTIAL/PLATELET
Basophils Absolute: 0 10*3/uL (ref 0.0–0.1)
Basophils Relative: 0 %
EOS PCT: 3 %
Eosinophils Absolute: 0.3 10*3/uL (ref 0.0–0.7)
HCT: 34.4 % — ABNORMAL LOW (ref 39.0–52.0)
Hemoglobin: 11.8 g/dL — ABNORMAL LOW (ref 13.0–17.0)
LYMPHS ABS: 3 10*3/uL (ref 0.7–4.0)
LYMPHS PCT: 31 %
MCH: 33.1 pg (ref 26.0–34.0)
MCHC: 34.3 g/dL (ref 30.0–36.0)
MCV: 96.4 fL (ref 78.0–100.0)
MONO ABS: 0.6 10*3/uL (ref 0.1–1.0)
Monocytes Relative: 7 %
Neutro Abs: 5.6 10*3/uL (ref 1.7–7.7)
Neutrophils Relative %: 59 %
PLATELETS: 151 10*3/uL (ref 150–400)
RBC: 3.57 MIL/uL — AB (ref 4.22–5.81)
RDW: 13.4 % (ref 11.5–15.5)
WBC: 9.6 10*3/uL (ref 4.0–10.5)

## 2016-11-27 LAB — PROTIME-INR
INR: 1.01
Prothrombin Time: 13.3 seconds (ref 11.4–15.2)

## 2016-11-27 LAB — T4, FREE: FREE T4: 0.87 ng/dL (ref 0.61–1.12)

## 2016-11-27 LAB — MAGNESIUM: Magnesium: 1.9 mg/dL (ref 1.7–2.4)

## 2016-11-27 LAB — TSH: TSH: 2.626 u[IU]/mL (ref 0.350–4.500)

## 2016-11-27 MED ORDER — DILTIAZEM HCL 25 MG/5ML IV SOLN
10.0000 mg | Freq: Once | INTRAVENOUS | Status: AC
  Administered 2016-11-28: 10 mg via INTRAVENOUS
  Filled 2016-11-27: qty 5

## 2016-11-27 MED ORDER — SODIUM CHLORIDE 0.9 % IV BOLUS (SEPSIS)
1000.0000 mL | Freq: Once | INTRAVENOUS | Status: AC
  Administered 2016-11-27: 1000 mL via INTRAVENOUS

## 2016-11-27 MED ORDER — DILTIAZEM HCL 25 MG/5ML IV SOLN
5.0000 mg | Freq: Once | INTRAVENOUS | Status: AC
  Administered 2016-11-27: 5 mg via INTRAVENOUS
  Filled 2016-11-27: qty 5

## 2016-11-27 NOTE — ED Triage Notes (Addendum)
Pt. reports palpitations / tachycardia onset 9 pm this evening , his cardiologist is Dr. Johnsie Cancel , denies chest pain or SOB . Hemodialysis q Tues/Thurs/Sat.

## 2016-11-27 NOTE — ED Notes (Signed)
Pt reports sudden onset of palpitations at 9pm while watching TV. Denies chest pain. Pt has had similar episodes in the past. Per wife, pt has had this happen about every 6 years. Last time this happened, pt converted on his own after "a day or two." Pt is a home hemodialysis patient, Monday-Friday.

## 2016-11-27 NOTE — ED Provider Notes (Signed)
[place] DEPT Provider Note   CSN: [id number] Arrival date & time: 11/27/16  2145     History   Chief Complaint Chief Complaint  Patient presents with  . Palpitations    Heart Rate = 180's     HPI [name] [name] is a 55 y.o. male.  The history is provided by the patient.  Palpitations   This is a recurrent problem. The current episode started less than 1 hour ago. The problem occurs constantly. The problem has not changed since onset.On average, each episode lasts 1 hour. Associated symptoms include irregular heartbeat. Pertinent negatives include no fever, no malaise/fatigue, no numbness, no chest pain, no chest pressure, no claudication, no near-syncope, no orthopnea, no syncope, no nausea, no vomiting, no lower extremity edema, no shortness of breath and no sputum production. He has tried nothing for the symptoms.    Past Medical History:  Diagnosis Date  . Arthritis   . Cataract    bilateral- right removed, left present  . Chronic diastolic CHF (congestive heart failure) ([place])   . Complication of anesthesia    problem with bladder not waking up after surgery-for right nephrectomy  . Dysrhythmia   . Enlarged kidney    right  . Gout   . Hyperlipidemia   . Hypertension   . PAF (paroxysmal atrial fibrillation) (HCC)    happens about every 6 years-sees nishan  . Renal insufficiency    KIDNEY DISEASE - FOLLOWED BY DR. Lorrene Reid NOTES ON CHART    Patient Active Problem List   Diagnosis Date Noted  . ESRD on dialysis ([place]) 11/28/2016  . Hypokalemia 11/28/2016  . Chronic diastolic CHF (congestive heart failure) ([place])   . Status post laparoscopic cholecystectomy August 2016 07/13/2015  . Renal failure 07/13/2015  . Chronic kidney disease, stage V ([place]) 09/18/2014  . Encounter for adequacy testing for dialysis ([place]) 09/18/2014  . S/P repair of ventral hernia 06/21/2014  . Lapband APS July 2015 06/20/2014  . Status post gastric banding 06/20/2014  . Morbid  obesity BMI 42 01/04/2014  . Hypertension   . Renal disorder   . Gout   . Atrial fibrillation with RVR [facility])     Past Surgical History:  Procedure Laterality Date  . AV FISTULA PLACEMENT     "IT CLOSED UP AND DOES NOT WORK"  . AV FISTULA PLACEMENT Right 10/27/2014   Procedure: ARTERIOVENOUS (AV) FISTULA CREATION;  Surgeon: Conrad Clyde [place], MD;  Location: [place];  Service: Vascular;  Laterality: Right;  . AV FISTULA PLACEMENT Right 08/22/2015   Procedure: BRACHIOCEPHALIC ARTERIOVENOUS (AV) FISTULA CREATION;  Surgeon: Conrad [name], MD;  Location: [place];  Service: Vascular;  Laterality: Right;  . CATARACTS REMOVED Right   . CHOLECYSTECTOMY N/A 07/13/2015   Procedure: LAPAROSCOPIC CHOLECYSTECTOMY WITH INTRAOPERATIVE CHOLANGIOGRAM;  Surgeon: Johnathan Hausen, MD;  [facility];  Service: General;  Laterality: N/A;  . EYE SURGERY     left cataract surgery-lens implant  . HERNIA REPAIR    . INSERTION OF MESH N/A 06/20/2014   Procedure: INSERTION OF MESH;  Surgeon: Khamil Lamica Earls, MD;  [facility];  Service: General;  Laterality: N/A;  . KNEE SURGERY  2001  . LAPAROSCOPIC GASTRIC BANDING N/A 06/20/2014   Procedure: LAPAROSCOPIC GASTRIC BANDING and VENTRAL HERNIA REPAIR WITH MESH;  Surgeon: Tykera Skates Earls, MD;  [facility];  Service: General;  Laterality: N/A;  . TOTAL NEPHRECTOMY Right        Home Medications    Prior  to Admission medications   Medication Sig Start Date End Date Taking? Authorizing Provider  aspirin EC 81 MG tablet Take 81 mg by mouth every evening.    Yes Historical Provider, MD  atorvastatin (LIPITOR) 10 MG tablet Take 10 mg by mouth every evening.    Yes Historical Provider, MD  b complex-vitamin c-folic acid (NEPHRO-VITE) 0.8 MG TABS tablet Take 1 tablet by mouth every evening.   Yes Historical Provider, MD  metoprolol tartrate (LOPRESSOR) 25 MG tablet Take 25 mg by mouth every evening.   Yes Historical Provider, MD    Family History Family History    Problem Relation Age of Onset  . Hypertension Mother   . Heart disease Mother     before age 1  . Cancer Father     bone  . Cancer Sister     breast  . Cancer Sister     breast  . CAD    . Colon cancer Neg Hx   . Rectal cancer Neg Hx   . Stomach cancer Neg Hx     Social History Social History  Substance Use Topics  . Smoking status: Never Smoker  . Smokeless tobacco: Never Used  . Alcohol use No     Allergies   Patient has no known allergies.   Review of Systems Review of Systems  Constitutional: Negative for fever and malaise/fatigue.  Respiratory: Negative for sputum production and shortness of breath.   Cardiovascular: Positive for palpitations. Negative for chest pain, orthopnea, claudication, syncope and near-syncope.  Gastrointestinal: Negative for nausea and vomiting.  Neurological: Negative for numbness.  Ten systems are reviewed and are negative for acute change except as noted in the HPI    Physical Exam Updated Vital Signs BP 118/74   Pulse 94   Resp 18   SpO2 (!) 89%   Physical Exam  Constitutional: He is oriented to person, place, and time. He appears well-developed and well-nourished. No distress.  HENT:  Head: Normocephalic and atraumatic.  Nose: Nose normal.  Eyes: Conjunctivae and EOM are normal. Pupils are equal, round, and reactive to light. Right eye exhibits no discharge. Left eye exhibits no discharge. No scleral icterus.  Neck: Normal range of motion. Neck supple.  Cardiovascular: An irregularly irregular rhythm present. Tachycardia present.  Exam reveals no gallop and no friction rub.   No murmur heard. Pulmonary/Chest: Effort normal and breath sounds normal. No stridor. No respiratory distress. He has no rales.  Abdominal: Soft. He exhibits no distension. There is no tenderness.  Musculoskeletal: He exhibits no edema or tenderness.  Neurological: He is alert and oriented to person, place, and time.  Skin: Skin is warm and dry. No  rash noted. He is not diaphoretic. No erythema.  Psychiatric: He has a normal mood and affect.  Vitals reviewed.    ED Treatments / Results  Labs (all labs ordered are listed, but only abnormal results are displayed) Labs Reviewed  CBC WITH DIFFERENTIAL/PLATELET - Abnormal; Notable for the following:       Result Value   RBC 3.57 (*)    Hemoglobin 11.8 (*)    HCT 34.4 (*)    All other components within normal limits  COMPREHENSIVE METABOLIC PANEL - Abnormal; Notable for the following:    Potassium 3.1 (*)    Chloride 98 (*)    Glucose, Bld 106 (*)    BUN 22 (*)    Creatinine, Ser 5.43 (*)    GFR calc non Af Amer 11 (*)  GFR calc Af Amer 12 (*)    All other components within normal limits  TSH  T4, FREE  PROTIME-INR  MAGNESIUM  RAPID URINE DRUG SCREEN, HOSP PERFORMED  HEMOGLOBIN A1C  LIPID PANEL  TROPONIN I  TROPONIN I  TROPONIN I  BASIC METABOLIC PANEL  CBC    EKG  EKG Interpretation  Date/Time:  Thursday November 27 2016 21:53:35 EST Ventricular Rate:  169 PR Interval:    QRS Duration: 86 QT Interval:  304 QTC Calculation: 509 R Axis:   56 Text Interpretation:  Atrial fibrillation with rapid ventricular response Marked ST abnormality, possible inferior subendocardial injury Marked ST abnormality, possible anterolateral subendocardial injury Abnormal ECG Confirmed by [facility] MD, Marien Manship ([id number]) on 11/27/2016 10:11:13 PM       Radiology No results found.  Procedures .Cardioversion Date/Time: 11/28/2016 12:48 AM Performed by: Fatima Blank Authorized by: Fatima Blank   Consent:    Consent obtained:  Written   Consent given by:  Patient   Risks discussed:  Cutaneous burn, induced arrhythmia, pain and death   Alternatives discussed:  Rate-control medication Pre-procedure details:    Cardioversion basis:  Elective   Rhythm:  Atrial fibrillation   Electrode placement:  Anterior-posterior Attempt one:    Cardioversion mode:   Synchronous   Waveform:  Biphasic   Shock (Joules):  200   Shock outcome:  Conversion to normal sinus rhythm Post-procedure details:    Patient status:  Alert   Patient tolerance of procedure:  Tolerated well, no immediate complications   (including critical care time) Procedural sedation Performed by: Fatima Blank Consent: Verbal consent obtained. Risks and benefits: risks, benefits and alternatives were discussed Required items: required blood products, implants, devices, and/or special equipment available Patient identity confirmed: arm band and provided demographic data Time out: Immediately prior to procedure a "time out" was called to verify the correct patient, procedure, equipment, support staff and site/side marked as required.  Sedation type: moderate (conscious) sedation NPO time confirmed and considedered  Sedatives: versed 6mg   Physician Time at Bedside: 47min  Vitals: Vital signs were monitored during sedation. Cardiac Monitor, pulse oximeter Patient tolerance: Patient tolerated the procedure well with no immediate complications. Comments: Pt with uneventful recovered. Returned to pre-procedural sedation baseline   Medications Ordered in ED Medications  midazolam (VERSED) 2 MG/2ML injection (not administered)  midazolam (VERSED) injection (1 mg Intravenous Given 11/28/16 0042)  multivitamin (RENA-VIT) tablet 1 tablet (not administered)  metoprolol tartrate (LOPRESSOR) tablet 25 mg (not administered)  aspirin EC tablet 81 mg (not administered)  atorvastatin (LIPITOR) tablet 10 mg (not administered)  magnesium sulfate IVPB 1 g 100 mL (not administered)  potassium chloride 20 MEQ/15ML (10%) solution 40 mEq (not administered)  acetaminophen (TYLENOL) tablet 650 mg (not administered)  zolpidem (AMBIEN) tablet 5 mg (not administered)  ALPRAZolam (XANAX) tablet 0.25 mg (not administered)  heparin injection 5,000 Units (not administered)  sodium chloride 0.9  % bolus 1,000 mL (0 mLs Intravenous Stopped 11/27/16 2336)  diltiazem (CARDIZEM) injection 5 mg (5 mg Intravenous Given 11/27/16 2336)  diltiazem (CARDIZEM) injection 10 mg (10 mg Intravenous Given 11/28/16 0008)  midazolam (VERSED) injection 2 mg (2 mg Intravenous Given 11/28/16 0033)     Initial Impression / Assessment and Plan / ED Course  I have reviewed the triage vital signs and the nursing notes.  Pertinent labs & imaging results that were available during my care of the patient were reviewed by me and considered in my medical decision making (  see chart for details).  Clinical Course     AFRVR on hemodialysis for ESRD. Currently hemodynamically stable. We'll obtain screening labs and provide patient with IV fluid bolus. Will discuss case with cardiology given the patient's history of ESRD on dialysis.  Cardiology recommended 5 mg bolus of diltiazem. If that is unsuccessful we'll try for cardioversion and will admit for bridging to Coumadin.  Patient demented to stepdown unit under hospitalist management. Cardiology will follow  Final Clinical Impressions(s) / ED Diagnoses   Final diagnoses:  Chronic diastolic CHF (congestive heart failure) ([place])  Atrial fibrillation with RVR ([place])      Fatima Blank, MD 11/28/16 [phone]

## 2016-11-27 NOTE — ED Notes (Signed)
IV attempted x1 without success

## 2016-11-27 NOTE — ED Notes (Signed)
Nurse will try to pull from IV for TSH and T4

## 2016-11-28 ENCOUNTER — Observation Stay (HOSPITAL_BASED_OUTPATIENT_CLINIC_OR_DEPARTMENT_OTHER): Payer: Commercial Managed Care - HMO

## 2016-11-28 ENCOUNTER — Encounter (HOSPITAL_COMMUNITY): Payer: Self-pay | Admitting: Internal Medicine

## 2016-11-28 DIAGNOSIS — Z79899 Other long term (current) drug therapy: Secondary | ICD-10-CM | POA: Diagnosis not present

## 2016-11-28 DIAGNOSIS — I5032 Chronic diastolic (congestive) heart failure: Secondary | ICD-10-CM | POA: Diagnosis present

## 2016-11-28 DIAGNOSIS — D649 Anemia, unspecified: Secondary | ICD-10-CM | POA: Diagnosis present

## 2016-11-28 DIAGNOSIS — E785 Hyperlipidemia, unspecified: Secondary | ICD-10-CM | POA: Diagnosis present

## 2016-11-28 DIAGNOSIS — I248 Other forms of acute ischemic heart disease: Secondary | ICD-10-CM | POA: Diagnosis present

## 2016-11-28 DIAGNOSIS — R002 Palpitations: Secondary | ICD-10-CM | POA: Diagnosis present

## 2016-11-28 DIAGNOSIS — I480 Paroxysmal atrial fibrillation: Secondary | ICD-10-CM | POA: Diagnosis present

## 2016-11-28 DIAGNOSIS — I4891 Unspecified atrial fibrillation: Secondary | ICD-10-CM

## 2016-11-28 DIAGNOSIS — E876 Hypokalemia: Secondary | ICD-10-CM

## 2016-11-28 DIAGNOSIS — Z7982 Long term (current) use of aspirin: Secondary | ICD-10-CM | POA: Diagnosis not present

## 2016-11-28 DIAGNOSIS — N186 End stage renal disease: Secondary | ICD-10-CM

## 2016-11-28 DIAGNOSIS — I10 Essential (primary) hypertension: Secondary | ICD-10-CM

## 2016-11-28 DIAGNOSIS — Z8249 Family history of ischemic heart disease and other diseases of the circulatory system: Secondary | ICD-10-CM | POA: Diagnosis not present

## 2016-11-28 DIAGNOSIS — Z992 Dependence on renal dialysis: Secondary | ICD-10-CM

## 2016-11-28 DIAGNOSIS — Z7682 Awaiting organ transplant status: Secondary | ICD-10-CM | POA: Diagnosis not present

## 2016-11-28 DIAGNOSIS — Z9884 Bariatric surgery status: Secondary | ICD-10-CM | POA: Diagnosis not present

## 2016-11-28 DIAGNOSIS — I132 Hypertensive heart and chronic kidney disease with heart failure and with stage 5 chronic kidney disease, or end stage renal disease: Secondary | ICD-10-CM | POA: Diagnosis present

## 2016-11-28 HISTORY — DX: End stage renal disease: Z99.2

## 2016-11-28 HISTORY — DX: Unspecified atrial fibrillation: I48.91

## 2016-11-28 HISTORY — DX: End stage renal disease: N18.6

## 2016-11-28 HISTORY — DX: Hyperlipidemia, unspecified: E78.5

## 2016-11-28 HISTORY — DX: Hypokalemia: E87.6

## 2016-11-28 LAB — HEPARIN LEVEL (UNFRACTIONATED)
HEPARIN UNFRACTIONATED: 0.24 [IU]/mL — AB (ref 0.30–0.70)
Heparin Unfractionated: 0.25 IU/mL — ABNORMAL LOW (ref 0.30–0.70)

## 2016-11-28 LAB — BASIC METABOLIC PANEL
Anion gap: 10 (ref 5–15)
BUN: 23 mg/dL — AB (ref 6–20)
CHLORIDE: 100 mmol/L — AB (ref 101–111)
CO2: 27 mmol/L (ref 22–32)
CREATININE: 5.83 mg/dL — AB (ref 0.61–1.24)
Calcium: 8.9 mg/dL (ref 8.9–10.3)
GFR calc Af Amer: 11 mL/min — ABNORMAL LOW (ref >60)
GFR calc non Af Amer: 10 mL/min — ABNORMAL LOW (ref >60)
GLUCOSE: 123 mg/dL — AB (ref 65–99)
Potassium: 3.3 mmol/L — ABNORMAL LOW (ref 3.5–5.1)
SODIUM: 137 mmol/L (ref 135–145)

## 2016-11-28 LAB — CBC
HEMATOCRIT: 30.3 % — AB (ref 39.0–52.0)
HEMOGLOBIN: 10.4 g/dL — AB (ref 13.0–17.0)
MCH: 32.3 pg (ref 26.0–34.0)
MCHC: 34.3 g/dL (ref 30.0–36.0)
MCV: 94.1 fL (ref 78.0–100.0)
Platelets: 165 10*3/uL (ref 150–400)
RBC: 3.22 MIL/uL — AB (ref 4.22–5.81)
RDW: 13.1 % (ref 11.5–15.5)
WBC: 7.1 10*3/uL (ref 4.0–10.5)

## 2016-11-28 LAB — LIPID PANEL
Cholesterol: 74 mg/dL (ref 0–200)
HDL: 24 mg/dL — ABNORMAL LOW (ref >40)
LDL CALC: 39 mg/dL (ref 0–99)
Total CHOL/HDL Ratio: 3.1 RATIO
Triglycerides: 56 mg/dL (ref <150)
VLDL: 11 mg/dL (ref 0–40)

## 2016-11-28 LAB — TROPONIN I
TROPONIN I: 0.06 ng/mL — AB (ref <0.03)
TROPONIN I: 0.11 ng/mL — AB (ref <0.03)
TROPONIN I: 0.11 ng/mL — AB (ref <0.03)

## 2016-11-28 LAB — ECHOCARDIOGRAM COMPLETE
Height: 70 in
WEIGHTICAEL: 4449.6 [oz_av]

## 2016-11-28 LAB — HEMOGLOBIN A1C
HEMOGLOBIN A1C: 5.2 % (ref 4.8–5.6)
MEAN PLASMA GLUCOSE: 103 mg/dL

## 2016-11-28 LAB — MRSA PCR SCREENING: MRSA by PCR: NEGATIVE

## 2016-11-28 LAB — PROTIME-INR
INR: 1.18
Prothrombin Time: 15.1 seconds (ref 11.4–15.2)

## 2016-11-28 MED ORDER — METOPROLOL TARTRATE 25 MG PO TABS
25.0000 mg | ORAL_TABLET | Freq: Two times a day (BID) | ORAL | Status: DC
  Administered 2016-11-28 – 2016-12-03 (×10): 25 mg via ORAL
  Filled 2016-11-28 (×10): qty 1

## 2016-11-28 MED ORDER — MIDAZOLAM HCL 2 MG/2ML IJ SOLN
2.0000 mg | Freq: Once | INTRAMUSCULAR | Status: AC
  Administered 2016-11-28: 2 mg via INTRAVENOUS
  Filled 2016-11-28: qty 2

## 2016-11-28 MED ORDER — WARFARIN - PHARMACIST DOSING INPATIENT
Freq: Every day | Status: DC
  Administered 2016-11-28 – 2016-12-02 (×3)

## 2016-11-28 MED ORDER — METOPROLOL TARTRATE 25 MG PO TABS
25.0000 mg | ORAL_TABLET | Freq: Every evening | ORAL | Status: DC
Start: 2016-11-28 — End: 2016-11-28

## 2016-11-28 MED ORDER — MIDAZOLAM HCL 2 MG/2ML IJ SOLN
INTRAMUSCULAR | Status: AC | PRN
  Administered 2016-11-28 (×4): 1 mg via INTRAVENOUS

## 2016-11-28 MED ORDER — WARFARIN SODIUM 7.5 MG PO TABS
7.5000 mg | ORAL_TABLET | Freq: Once | ORAL | Status: AC
  Administered 2016-11-28: 7.5 mg via ORAL
  Filled 2016-11-28: qty 1

## 2016-11-28 MED ORDER — HEPARIN (PORCINE) IN NACL 100-0.45 UNIT/ML-% IJ SOLN
1950.0000 [IU]/h | INTRAMUSCULAR | Status: DC
  Administered 2016-11-28 (×2): 1750 [IU]/h via INTRAVENOUS
  Administered 2016-11-29 – 2016-12-02 (×6): 1950 [IU]/h via INTRAVENOUS
  Filled 2016-11-28 (×8): qty 250

## 2016-11-28 MED ORDER — HEPARIN BOLUS VIA INFUSION
5000.0000 [IU] | Freq: Once | INTRAVENOUS | Status: AC
  Administered 2016-11-28: 5000 [IU] via INTRAVENOUS
  Filled 2016-11-28: qty 5000

## 2016-11-28 MED ORDER — ASPIRIN EC 81 MG PO TBEC
81.0000 mg | DELAYED_RELEASE_TABLET | Freq: Every evening | ORAL | Status: DC
  Administered 2016-11-28 – 2016-12-02 (×5): 81 mg via ORAL
  Filled 2016-11-28 (×5): qty 1

## 2016-11-28 MED ORDER — ACETAMINOPHEN 325 MG PO TABS: 650.0000 mg | ORAL_TABLET | ORAL | Status: DC | PRN

## 2016-11-28 MED ORDER — WARFARIN SODIUM 5 MG PO TABS: 5.0000 mg | ORAL_TABLET | Freq: Every day | ORAL | Status: DC

## 2016-11-28 MED ORDER — WARFARIN VIDEO
Freq: Once | Status: AC
Start: 2016-11-28 — End: 2016-11-28
  Administered 2016-11-28: 11:00:00

## 2016-11-28 MED ORDER — ALPRAZOLAM 0.25 MG PO TABS
0.2500 mg | ORAL_TABLET | Freq: Two times a day (BID) | ORAL | Status: DC | PRN
  Administered 2016-11-28 – 2016-11-30 (×4): 0.25 mg via ORAL
  Filled 2016-11-28 (×5): qty 1

## 2016-11-28 MED ORDER — POTASSIUM CHLORIDE 20 MEQ/15ML (10%) PO SOLN: 40.0000 meq | Freq: Once | ORAL | Status: DC

## 2016-11-28 MED ORDER — HEPARIN (PORCINE) IN NACL 100-0.45 UNIT/ML-% IJ SOLN
1600.0000 [IU]/h | INTRAMUSCULAR | Status: DC
  Administered 2016-11-28: 1600 [IU]/h via INTRAVENOUS
  Filled 2016-11-28: qty 250

## 2016-11-28 MED ORDER — ATORVASTATIN CALCIUM 10 MG PO TABS
10.0000 mg | ORAL_TABLET | Freq: Every evening | ORAL | Status: DC
  Administered 2016-11-28 – 2016-12-02 (×5): 10 mg via ORAL
  Filled 2016-11-28 (×5): qty 1

## 2016-11-28 MED ORDER — PANTOPRAZOLE SODIUM 40 MG PO TBEC
40.0000 mg | DELAYED_RELEASE_TABLET | Freq: Every day | ORAL | Status: DC
  Administered 2016-11-28 – 2016-12-03 (×5): 40 mg via ORAL
  Filled 2016-11-28 (×5): qty 1

## 2016-11-28 MED ORDER — RENA-VITE PO TABS
1.0000 | ORAL_TABLET | Freq: Every day | ORAL | Status: DC
  Administered 2016-11-28 – 2016-12-02 (×5): 1 via ORAL
  Filled 2016-11-28 (×5): qty 1

## 2016-11-28 MED ORDER — HEPARIN SODIUM (PORCINE) 5000 UNIT/ML IJ SOLN: 5000.0000 [IU] | Freq: Three times a day (TID) | INTRAMUSCULAR | Status: DC

## 2016-11-28 MED ORDER — ZOLPIDEM TARTRATE 5 MG PO TABS: 5.0000 mg | ORAL_TABLET | Freq: Every evening | ORAL | Status: DC | PRN

## 2016-11-28 MED ORDER — MAGNESIUM SULFATE IN D5W 1-5 GM/100ML-% IV SOLN
1.0000 g | Freq: Once | INTRAVENOUS | Status: AC
Start: 2016-11-28 — End: 2016-11-28
  Administered 2016-11-28: 1 g via INTRAVENOUS
  Filled 2016-11-28: qty 100

## 2016-11-28 MED ORDER — MIDAZOLAM HCL 2 MG/2ML IJ SOLN
INTRAMUSCULAR | Status: AC
  Filled 2016-11-28: qty 4

## 2016-11-28 MED ORDER — SENNA 8.6 MG PO TABS
1.0000 | ORAL_TABLET | Freq: Every day | ORAL | Status: DC
  Administered 2016-11-28 – 2016-12-02 (×4): 8.6 mg via ORAL
  Filled 2016-11-28 (×5): qty 1

## 2016-11-28 MED ORDER — PATIENT'S GUIDE TO USING COUMADIN BOOK
Freq: Once | Status: AC
Start: 2016-11-28 — End: 2016-11-28
  Administered 2016-11-28: 11:00:00
  Filled 2016-11-28: qty 1

## 2016-11-28 MED ORDER — POTASSIUM CHLORIDE CRYS ER 20 MEQ PO TBCR
40.0000 meq | EXTENDED_RELEASE_TABLET | Freq: Once | ORAL | Status: AC
  Administered 2016-11-28: 40 meq via ORAL
  Filled 2016-11-28: qty 2

## 2016-11-28 NOTE — Progress Notes (Signed)
ANTICOAGULATION CONSULT NOTE - Initial Consult  Pharmacy Consult for Heparin and Coumadin Indication: atrial fibrillation  No Known Allergies  Patient Measurements:    Ht: 70 in  Wt: 126 kg  IBW: 73 kg Heparin Dosing Weight: 101 kg  Vital Signs: BP: 118/74 (12/29 0100) Pulse Rate: 94 (12/29 0100)  Labs:  Recent Labs  11/27/16 2159  HGB 11.8*  HCT 34.4*  PLT 151  LABPROT 13.3  INR 1.01  CREATININE 5.43*    CrCl cannot be calculated (Unknown ideal weight.).   Medical History: Past Medical History:  Diagnosis Date  . Arthritis   . Cataract    bilateral- right removed, left present  . Chronic diastolic CHF (congestive heart failure) ([place])   . Complication of anesthesia    problem with bladder not waking up after surgery-for right nephrectomy  . Dysrhythmia   . Enlarged kidney    right  . Gout   . Hyperlipidemia   . Hypertension   . PAF (paroxysmal atrial fibrillation) (HCC)    happens about every 6 years-sees nishan  . Renal insufficiency    KIDNEY DISEASE - FOLLOWED BY DR. Lorrene Reid NOTES ON CHART    Medications:  See electronic med rec  Assessment: 55 y.o. M presents with new onset afib with RVR. S/p DCCV in ED 12/29. Now to begin heparin bridge to coumadin. CBC ok on admission. Pt with ESRD - waiting on kidney transplant. Baseline INR 1.01.  Goal of Therapy:  Heparin level 0.3-0.7 units/ml; INR 2-3 Monitor platelets by anticoagulation protocol: Yes   Plan:  Heparin IV bolus 5000 units Heparin gtt at 1600 units/hr Will f/u heparin level in 8 hours Warfarin 7.5mg  tonight Daily heparin level, INR, and CBC  Sherlon Handing, PharmD, BCPS Clinical pharmacist, pager [phone] 11/28/2016,2:02 AM

## 2016-11-28 NOTE — Sedation Documentation (Signed)
Cardiology at bedside.

## 2016-11-28 NOTE — Discharge Instructions (Addendum)

## 2016-11-28 NOTE — Consult Note (Signed)
CARDIOLOGY CONSULT NOTE   Referring Physician: Addison Lank, MD Primary Physician: Dr. Lorrene Reid Primary Cardiologist: Dr. Johnsie Cancel Reason for Consultation: AF RVR  HPI: [name] [name] is a 55 year old male with a history of chronic diastolic heart failure, hypertension, hyperlipidemia, paroxysmal atrial fibrillation, and ESRD on hemodialysis who presents with atrial fibrillation with a rapid ventricular rate.  [name] [name] is currently being worked up for a renal transplant by [facility].  He was in his usual state of health when he noted the onset of rapid palpitations at 9 PM. He denies any other symptoms including fatigue, shortness of breath, chest pain, orthopnea, PND, lower extremity edema, changes in vision or hearing, or changes in strength or sensation. These prompted him to present to the emergency department. His initial EKG showed atrial fibrillation with heart rates as high as 180 bpm. He was given Diltiazem 5 mg IV x 1 and then Diltiazem 10 mg IV x 1 with minimal effect.  His recent cardiac studies can be found in care everywhere. His last echocardiogram was earlier this year and shows a normal ejection fraction and no significant valvular disease disease. His last stress SPECT was also earlier this year and was negative for inducible ischemia. His last episode of atrial fibrillation was 6 years ago.  After discussion with [name] [name] and his family (wife and son), we decided to proceed with urgent cardioversion. After informed consent, a single shock was given at 200 J to convert the patient to normal sinus rhythm.  Review of Systems:     Cardiac Review of Systems: {Y] = yes [ ]  = no  Chest Pain [    ]  Resting SOB [   ] Exertional SOB  [  ]  Orthopnea [  ]   Pedal Edema [   ]    Palpitations [X]  Syncope  [  ]   Presyncope [   ]  General Review of Systems: [Y] = yes [  ]=no Constitional: recent weight change [  ]; anorexia [  ]; fatigue [  ]; nausea [  ]; night sweats  [  ]; fever [  ]; or chills [  ];                                                                     Eyes : blurred vision [  ]; diplopia [   ]; vision changes [  ];  Amaurosis fugax[  ]; Resp: cough [  ];  wheezing[  ];  hemoptysis[  ];  PND [  ];  GI:  gallstones[  ], vomiting[  ];  dysphagia[  ]; melena[  ];  hematochezia [  ]; heartburn[  ];   GU: kidney stones [  ]; hematuria[  ];   dysuria [  ];  nocturia[  ]; incontinence [  ];             Skin: rash, swelling[  ];, hair loss[  ];  peripheral edema[  ];  or itching[  ]; Musculosketetal: myalgias[  ];  joint swelling[  ];  joint erythema[  ];  joint pain[  ];  back pain[  ];  Heme/Lymph: bruising[  ];  bleeding[  ];  anemia[  ];  Neuro: TIA[  ];  headaches[  ];  stroke[  ];  vertigo[  ];  seizures[  ];   paresthesias[  ];  difficulty walking[  ];  Psych:depression[  ]; anxiety[  ];  Endocrine: diabetes[  ];  thyroid dysfunction[  ];  Other:  Past Medical History:  Diagnosis Date  . Arthritis   . Cataract    bilateral- right removed, left present  . Chronic diastolic CHF (congestive heart failure) ([place])   . Complication of anesthesia    problem with bladder not waking up after surgery-for right nephrectomy  . Dysrhythmia   . Enlarged kidney    right  . Gout   . Hyperlipidemia   . Hypertension   . PAF (paroxysmal atrial fibrillation) (HCC)    happens about every 6 years-sees nishan  . Renal insufficiency    KIDNEY DISEASE - FOLLOWED BY DR. Lorrene Reid NOTES ON CHART   Prior to Admission medications   Medication Sig Start Date End Date Taking? Authorizing Provider  aspirin EC 81 MG tablet Take 81 mg by mouth every evening.    Yes Historical Provider, MD  atorvastatin (LIPITOR) 10 MG tablet Take 10 mg by mouth every evening.    Yes Historical Provider, MD  b complex-vitamin c-folic acid (NEPHRO-VITE) 0.8 MG TABS tablet Take 1 tablet by mouth every evening.   Yes Historical Provider, MD  metoprolol tartrate (LOPRESSOR) 25 MG tablet  Take 25 mg by mouth every evening.   Yes Historical Provider, MD    . aspirin EC  81 mg Oral QPM  . atorvastatin  10 mg Oral QPM  . heparin subcutaneous  5,000 Units Subcutaneous Q8H  . metoprolol tartrate  25 mg Oral BID  . multivitamin  1 tablet Oral QHS  . potassium chloride  40 mEq Oral Once   Infusions: . magnesium sulfate 1 - 4 g bolus IVPB      No Known Allergies  Social History   Social History  . Marital status: Married    Spouse name: N/A  . Number of children: N/A  . Years of education: N/A   Occupational History  . Not on file.   Social History Main Topics  . Smoking status: Never Smoker  . Smokeless tobacco: Never Used  . Alcohol use No  . Drug use: No  . Sexual activity: Not on file   Other Topics Concern  . Not on file   Social History Narrative  . No narrative on file    Family History  Problem Relation Age of Onset  . Hypertension Mother   . Heart disease Mother     before age 24  . Cancer Father     bone  . Cancer Sister     breast  . Cancer Sister     breast  . CAD    . Colon cancer Neg Hx   . Rectal cancer Neg Hx   . Stomach cancer Neg Hx     PHYSICAL EXAM: Vitals:   11/28/16 0050 11/28/16 0100  BP: 118/80 118/74  Pulse: 95 94  Resp: 13 18     Intake/Output Summary (Last 24 hours) at 11/28/16 0141 Last data filed at 11/27/16 2336  Gross per 24 hour  Intake             1000 ml  Output                0 ml  Net  1000 ml   General:  Well appearing. No respiratory difficulty HEENT: normal Neck: supple. no JVD. Carotids 2+ bilat; no bruits. No lymphadenopathy or thryomegaly appreciated. Cor: PMI nondisplaced. Irreg irreg. No rubs, gallops or murmurs. Lungs: clear Abdomen: soft, nontender, nondistended. No hepatosplenomegaly. No bruits or masses. Good bowel sounds. Extremities: no cyanosis, clubbing, rash, edema. R AVF.  Neuro: alert & oriented x 3, cranial nerves grossly intact. moves all 4 extremities w/o  difficulty. Affect pleasant.  ECG: AF RVR  Results for orders placed or performed during the hospital encounter of 11/27/16 (from the past 24 hour(s))  CBC with Differential     Status: Abnormal   Collection Time: 11/27/16  9:59 PM  Result Value Ref Range   WBC 9.6 4.0 - 10.5 K/uL   RBC 3.57 (L) 4.22 - 5.81 MIL/uL   Hemoglobin 11.8 (L) 13.0 - 17.0 g/dL   HCT 34.4 (L) 39.0 - 52.0 %   MCV 96.4 78.0 - 100.0 fL   MCH 33.1 26.0 - 34.0 pg   MCHC 34.3 30.0 - 36.0 g/dL   RDW 13.4 11.5 - 15.5 %   Platelets 151 150 - 400 K/uL   Neutrophils Relative % 59 %   Neutro Abs 5.6 1.7 - 7.7 K/uL   Lymphocytes Relative 31 %   Lymphs Abs 3.0 0.7 - 4.0 K/uL   Monocytes Relative 7 %   Monocytes Absolute 0.6 0.1 - 1.0 K/uL   Eosinophils Relative 3 %   Eosinophils Absolute 0.3 0.0 - 0.7 K/uL   Basophils Relative 0 %   Basophils Absolute 0.0 0.0 - 0.1 K/uL  Comprehensive metabolic panel     Status: Abnormal   Collection Time: 11/27/16  9:59 PM  Result Value Ref Range   Sodium 135 135 - 145 mmol/L   Potassium 3.1 (L) 3.5 - 5.1 mmol/L   Chloride 98 (L) 101 - 111 mmol/L   CO2 23 22 - 32 mmol/L   Glucose, Bld 106 (H) 65 - 99 mg/dL   BUN 22 (H) 6 - 20 mg/dL   Creatinine, Ser 5.43 (H) 0.61 - 1.24 mg/dL   Calcium 9.2 8.9 - 10.3 mg/dL   Total Protein 7.3 6.5 - 8.1 g/dL   Albumin 3.9 3.5 - 5.0 g/dL   AST 26 15 - 41 U/L   ALT 26 17 - 63 U/L   Alkaline Phosphatase 56 38 - 126 U/L   Total Bilirubin 1.0 0.3 - 1.2 mg/dL   GFR calc non Af Amer 11 (L) >60 mL/min   GFR calc Af Amer 12 (L) >60 mL/min   Anion gap 14 5 - 15  Protime-INR     Status: None   Collection Time: 11/27/16  9:59 PM  Result Value Ref Range   Prothrombin Time 13.3 11.4 - 15.2 seconds   INR 1.01   Magnesium     Status: None   Collection Time: 11/27/16  9:59 PM  Result Value Ref Range   Magnesium 1.9 1.7 - 2.4 mg/dL  TSH     Status: None   Collection Time: 11/27/16 10:20 PM  Result Value Ref Range   TSH 2.626 0.350 - 4.500 uIU/mL    T4, free     Status: None   Collection Time: 11/27/16 10:20 PM  Result Value Ref Range   Free T4 0.87 0.61 - 1.12 ng/dL   No results found.  ASSESSMENT: [name] [name] is a 55 year old male with a history of chronic diastolic heart failure, hypertension, hyperlipidemia, paroxysmal atrial fibrillation,  and ESRD on hemodialysis who presents with atrial fibrillation with a rapid ventricular rate who is s/p DCCV. Given his ESRD on hemodialysis, he is not a DOAC candidate. He will require bridging with unfractionated heparin until his INR is therapeutic.  PLAN/DISCUSSION: #) Cv(r) - AF RVR s/p DCCV. Current Chest, AHA/ACC/HRS, and CCS guidelines suggest that that patients not receiving DOAC therapy should receive bridging following urgent DC cardioversion (http://onlinelibrary.wiley.com/doi/10.1002/phar.1937/full) Dx: - CBC, Chem 7, TSH - EKG, Tele - Limited TTE eval LVEF (OSH TTE in 01/2016 with normal LVEF, mild to moderate Mr) Tx: - s/p DCCV - Increase Metoprolol to 25 mg twice daily - Heparin bridge to Coumadin, goal INR 2-3. CHADS2Vasc 2 (history of chronic diastolic heart failure and HTN)  #) HTN - resolved after starting hemodialysis - Monitor  #) HL - Atorvastation  Allyson Sabal, MD Cardiology Fellow

## 2016-11-28 NOTE — Progress Notes (Signed)
PROGRESS NOTE                                                                                                                                                                                                             Patient Demographics:    [name] [name], is a 55 y.o. male, DOB - Mar 02, 1961, LFY:[id number]  Admit date - 11/27/2016   Admitting Physician Ivor Costa, MD  Outpatient Primary MD for the patient is Pcp Not In System  LOS - 1   Chief Complaint  Patient presents with  . Palpitations    Heart Rate = 180's        Brief Narrative   [name] [name] is a 55 y.o. male with medical history significant of chronic diastolic heart failure, hypertension, hyperlipidemia, paroxysmal atrial fibrillation, and ESRD on home hemodialysis,  who presents palpitation, found to be in A. fib with RVR heart rate in the 180s, did not respond to diltiazem, status post DCCV, successfully converted to sinus rhythm, on heparin bridge and warfarin target INR 2-3, seen by renal, for hemodialysis tomorrow.   Subjective:    [name] [name] today has, No headache, No chest pain, No abdominal pain - No Nausea, No new weakness tingling or numbness, No Cough - SOB.   Assessment  & Plan :    Principal Problem:   Atrial fibrillation with RVR (HCC) Active Problems:   Hypertension   ESRD on dialysis (HCC)   Chronic diastolic CHF (congestive heart failure) (HCC)   Hypokalemia   HLD (hyperlipidemia)   A. fib with RVR - A dual joint appreciated, Status post cardioversion, currently normal sinus rhythm rate controlled. - Continue to monitor on telemetry,  - continue with beta blockers per cardiology recommendation - CHADS2Vasc 2 (history of chronic diastolic heart failure and HTN), heparin bridge to warfarin with target INR 2-3 - TSH wnl 2.626  Elevated troponins - Secondary to demand ischemia from A. fib , followed by cardiology  ESRD on home HD, - Renal  consult appreciated , for dialysis tomorrow.  Hypertension - Blood pressure acceptable metoprolol  Hyperlipidemia - continue with statin  Hx obesity sp gastric lapband  Code Status : Full  Family Communication  : wife at bedside  Disposition Plan  : home when INR therapeutic   Consults  :  Cards, renal  Procedures  :  DCCV  DVT Prophylaxis  :   Heparin GTT/warfarin  Lab Results  Component Value Date   PLT 151 11/27/2016    Antibiotics  :    Anti-infectives    None        Objective:   Vitals:   11/28/16 0315 11/28/16 0400 11/28/16 0748 11/28/16 1208  BP: 107/69  113/66 110/72  Pulse: 73  71 65  Resp: 14  16 19   Temp:  98.2 F (36.8 C) 98.5 F (36.9 C) 98.3 F (36.8 C)  TempSrc:  Oral Oral Oral  SpO2: 99%  96% 97%  Weight:  126.1 kg (278 lb 1.6 oz)    Height:  5\' 10"  (1.778 m)      Wt Readings from Last 3 Encounters:  11/28/16 126.1 kg (278 lb 1.6 oz)  01/10/16 126.6 kg (279 lb 1.9 oz)  10/12/15 125.6 kg (277 lb)     Intake/Output Summary (Last 24 hours) at 11/28/16 1531 Last data filed at 11/28/16 1100  Gross per 24 hour  Intake          1126.67 ml  Output                0 ml  Net          1126.67 ml     Physical Exam  Awake Alert, Oriented X 3, No new F.N deficits, Normal affect [name] [name].AT,PERRAL Supple Neck,No JVD, No cervical lymphadenopathy appriciated.  Symmetrical Chest wall movement, Good air movement bilaterally, CTAB RRR,No Gallops,Rubs or new Murmurs, No Parasternal Heave +ve B.Sounds, Abd Soft, No tenderness, No organomegaly appriciated, No rebound - guarding or rigidity. No Cyanosis, Clubbing or edema, No new Rash or bruise      Data Review:    CBC  Recent Labs Lab 11/27/16 2159  WBC 9.6  HGB 11.8*  HCT 34.4*  PLT 151  MCV 96.4  MCH 33.1  MCHC 34.3  RDW 13.4  LYMPHSABS 3.0  MONOABS 0.6  EOSABS 0.3  BASOSABS 0.0    Chemistries   Recent Labs Lab 11/27/16 2159 11/28/16 0346  NA 135 137  K 3.1* 3.3*  CL 98*  100*  CO2 23 27  GLUCOSE 106* 123*  BUN 22* 23*  CREATININE 5.43* 5.83*  CALCIUM 9.2 8.9  MG 1.9  --   AST 26  --   ALT 26  --   ALKPHOS 56  --   BILITOT 1.0  --    ------------------------------------------------------------------------------------------------------------------  Recent Labs  11/28/16 0346  CHOL 74  HDL 24*  LDLCALC 39  TRIG 56  CHOLHDL 3.1    No results found for: HGBA1C ------------------------------------------------------------------------------------------------------------------  Recent Labs  11/27/16 2220  TSH 2.626   ------------------------------------------------------------------------------------------------------------------ No results for input(s): VITAMINB12, FOLATE, FERRITIN, TIBC, IRON, RETICCTPCT in the last 72 hours.  Coagulation profile  Recent Labs Lab 11/27/16 2159 11/28/16 0346  INR 1.01 1.18    No results for input(s): DDIMER in the last 72 hours.  Cardiac Enzymes  Recent Labs Lab 11/28/16 0346 11/28/16 1010  TROPONINI 0.06* 0.11*   ------------------------------------------------------------------------------------------------------------------ No results found for: BNP  Inpatient Medications  Scheduled Meds: . aspirin EC  81 mg Oral QPM  . atorvastatin  10 mg Oral QPM  . metoprolol tartrate  25 mg Oral BID  . multivitamin  1 tablet Oral QHS  . warfarin  7.5 mg Oral ONCE-1800  . Warfarin - Pharmacist Dosing Inpatient   Does not apply q1800   Continuous Infusions: . heparin 1,750 Units/hr (11/28/16 1241)  PRN Meds:.acetaminophen, ALPRAZolam, zolpidem  Micro Results Recent Results (from the past 240 hour(s))  MRSA PCR Screening     Status: None   Collection Time: 11/28/16  4:28 AM  Result Value Ref Range Status   MRSA by PCR NEGATIVE NEGATIVE Final    Comment:        The GeneXpert MRSA Assay (FDA approved for NASAL specimens only), is one component of a comprehensive MRSA  colonization surveillance program. It is not intended to diagnose MRSA infection nor to guide or monitor treatment for MRSA infections.     Radiology Reports No results found.   [facility], Eleonor Ocon M.D on 11/28/2016 at 3:31 PM  Between 7am to 7pm - Pager - [phone]  After 7pm go to [email or web address] - password [facility]  [facility] -  Office  [phone]

## 2016-11-28 NOTE — Progress Notes (Signed)
CRITICAL VALUE ALERT  Critical value received:  Troponin 0.06  Date of notification: 11/28/16  Time of notification:  0515  Critical value read back:Yes.    Nurse who received alert:  Cipriano Mile   MD notified (1st page):  K.Schorr  Time of first page:  0517  MD notified (2nd page):  Time of second page:  Responding MD:  No response  Time MD responded:

## 2016-11-28 NOTE — Sedation Documentation (Signed)
Cardioverted at 200J: NSR rate of 90

## 2016-11-28 NOTE — Progress Notes (Signed)
Patient Name: [name] [name] Date of Encounter: 11/28/2016  Primary Cardiologist: Dr [facility] Problem List     Principal Problem:   Atrial fibrillation with RVR [facility]) Active Problems:   Hypertension   ESRD on dialysis ([place])   Chronic diastolic CHF (congestive heart failure) (HCC)   Hypokalemia   HLD (hyperlipidemia)     Subjective   [name] [name] feels great. No chest pain, shortness of breath, palpitations or dizziness.  Inpatient Medications    Scheduled Meds: . aspirin EC  81 mg Oral QPM  . atorvastatin  10 mg Oral QPM  . metoprolol tartrate  25 mg Oral BID  . multivitamin  1 tablet Oral QHS  . warfarin  7.5 mg Oral ONCE-1800  . Warfarin - Pharmacist Dosing Inpatient   Does not apply q1800   Continuous Infusions: . heparin 1,750 Units/hr (11/28/16 1241)   PRN Meds: acetaminophen, ALPRAZolam, zolpidem   Vital Signs    Vitals:   11/28/16 0315 11/28/16 0400 11/28/16 0748 11/28/16 1208  BP: 107/69  113/66 110/72  Pulse: 73  71 65  Resp: 14  16 19   Temp:  98.2 F (36.8 C) 98.5 F (36.9 C) 98.3 F (36.8 C)  TempSrc:  Oral Oral Oral  SpO2: 99%  96% 97%  Weight:  278 lb 1.6 oz (126.1 kg)    Height:  5\' 10"  (1.778 m)      Intake/Output Summary (Last 24 hours) at 11/28/16 1425 Last data filed at 11/28/16 1100  Gross per 24 hour  Intake          1126.67 ml  Output                0 ml  Net          1126.67 ml   Filed Weights   11/28/16 0400  Weight: 278 lb 1.6 oz (126.1 kg)    Physical Exam   GEN: Well nourished, well developed, caucasian male in no acute distress.  HEENT: Grossly normal.  Neck: Supple, no JVD, carotid bruits, or masses. Cardiac: RRR, no murmurs, rubs, or gallops. No clubbing, cyanosis, edema.  Radials/DP/PT 2+ and equal bilaterally.  Respiratory:  Respirations regular and unlabored, clear to auscultation bilaterally. GI: Soft, nontender, nondistended, BS + x 4. MS: no deformity or atrophy. Skin: warm and dry, no rash.  AV fistula left arm Neuro:  Strength and sensation are intact. Psych: AAOx3.  Normal affect.  Labs    CBC  Recent Labs  11/27/16 2159  WBC 9.6  NEUTROABS 5.6  HGB 11.8*  HCT 34.4*  MCV 96.4  PLT 297   Basic Metabolic Panel  Recent Labs  11/27/16 2159 11/28/16 0346  NA 135 137  K 3.1* 3.3*  CL 98* 100*  CO2 23 27  GLUCOSE 106* 123*  BUN 22* 23*  CREATININE 5.43* 5.83*  CALCIUM 9.2 8.9  MG 1.9  --    Liver Function Tests  Recent Labs  11/27/16 2159  AST 26  ALT 26  ALKPHOS 56  BILITOT 1.0  PROT 7.3  ALBUMIN 3.9   No results for input(s): LIPASE, AMYLASE in the last 72 hours. Cardiac Enzymes  Recent Labs  11/28/16 0346 11/28/16 1010  TROPONINI 0.06* 0.11*   BNP Invalid input(s): POCBNP D-Dimer No results for input(s): DDIMER in the last 72 hours. Hemoglobin A1C No results for input(s): HGBA1C in the last 72 hours. Fasting Lipid Panel  Recent Labs  11/28/16 0346  CHOL 74  HDL 24*  LDLCALC 39  TRIG 56  CHOLHDL 3.1   Thyroid Function Tests  Recent Labs  11/27/16 2220  TSH 2.626    Telemetry    NSR with rates in the 60's, no ectopy - Personally Reviewed  ECG   11/27/16- atrial fibrillation with RVR 169 bpm 11/28/16 0044 Post DCCV: Sinus rhythm at 91 bpm minimal ST depression diffusely - Personally Reviewed  Radiology    No results found.  Cardiac Studies   Echo pending   Patient Profile     55 yo male with past medical history significant for chronic diastolic heart failure (grade 2 DD), hypertension, hyperlipidemia, PAF, obesity with laparoscopic gastric banding and ESRD on home hemodialysis since 07/2016 and on renal transplant list at [facility] present to the ED on 11/27/16 for evaluation of new onset feeling of uneasiness in his chest and racing heart beat. He was found to be in atrial fibrillation with RVR at a rate of 169 bpm. Urgent cardioversion was completed with conversion to sinus rhythm. He has been started on  heparin IV with bridging to Coumadin.  Assessment & Plan    1. Atrial fibrillation with rapid ventricular response -Symptomatic on arrival with chest uneasiness and feeling of rapid heartbeat. No chest pain, shortness of breath, dizziness or orthopnea. -Has had several episodes of PAF in the past that spontaneously terminated without treatment, no previous anticoagulation -Diltiazem 5 mg and 10 mg given with minimal effect. Urgent cardioversion with 200 joules X1 shock to sinus rhythm. Continues in sinus rhythm in the 60's -Metoprolol 25 mg increase from once every evening to bid -CHA2DS2-VASc score 1 (HTN) Pt needs 4 weeks of anticoagulation post DCCV and possible atrial stunning. With ESRD on dialysis he is not a candidate for NOAC. He is on IV heparin with bridge to coumadin which has been initiated. Monitor INR with goal 2-3. -Once INR therapeutic can stop aspirin -Echo is pending -Pt thinks he had a cardiac cath more than 10 years ago at [facility] and recalls that it was normal. Unable to find report.  2. Elevated troponins -Troponins 0.06, 0.11 due to demand ischemia in setting of afib with RVR and S/P DCCV  3. Hypertension -Well controlled on metoprolol  4. Hyperlipidemia -continue atorvastatin  5. ESRD -On hemodialysis and awaiting renal transplant -Nephrology following with plans for HD on Sat and Monday    Signed, Daune Perch, NP  11/28/2016, 2:25 PM

## 2016-11-28 NOTE — Progress Notes (Signed)
[place] for Heparin and Coumadin  Indication: atrial fibrillation  No Known Allergies  Patient Measurements: Height: 5\' 10"  (177.8 cm) Weight: 278 lb 1.6 oz (126.1 kg) IBW/kg (Calculated) : 73  Ht: 70 in  Wt: 126 kg  IBW: 73 kg Heparin Dosing Weight: 101 kg  Vital Signs: Temp: 98.3 F (36.8 C) (12/29 1208) Temp Source: Oral (12/29 1208) BP: 110/72 (12/29 1208) Pulse Rate: 65 (12/29 1208)  Labs:  Recent Labs  11/27/16 2159 11/28/16 0346 11/28/16 1010  HGB 11.8*  --   --   HCT 34.4*  --   --   PLT 151  --   --   LABPROT 13.3 15.1  --   INR 1.01 1.18  --   HEPARINUNFRC  --   --  0.25*  CREATININE 5.43* 5.83*  --   TROPONINI  --  0.06* 0.11*    Estimated Creatinine Clearance: 18.6 mL/min (by C-G formula based on SCr of 5.83 mg/dL (H)).   Medical History: Past Medical History:  Diagnosis Date  . Arthritis   . Cataract    bilateral- right removed, left present  . Chronic diastolic CHF (congestive heart failure) ([place])   . Complication of anesthesia    problem with bladder not waking up after surgery-for right nephrectomy  . Dysrhythmia   . Enlarged kidney    right  . Gout   . Hyperlipidemia   . Hypertension   . PAF (paroxysmal atrial fibrillation) (HCC)    happens about every 6 years-sees nishan  . Renal insufficiency    KIDNEY DISEASE - FOLLOWED BY DR. Lorrene Reid NOTES ON CHART   . heparin 1,750 Units/hr (11/28/16 1241)     Assessment: 55 y.o. M presents with new onset afib with RVR. S/p DCCV in ED 12/29. Now to begin heparin bridge to coumadin. CBC ok on admission. Pt with ESRD - waiting on kidney transplant. Baseline INR 1.01.  Initial heparin level slightly subtherapeutic on heparin 1600 units/hr.  No bleeding or complications noted.  Goal of Therapy:  Heparin level 0.3-0.7 units/ml; INR 2-3 Monitor platelets by anticoagulation protocol: Yes   Plan:  Increase IV heparin gtt to 1750 units/hr. Recheck heparin  level in 6 hrs.  Warfarin 7.5mg  tonight Daily heparin level, INR, and CBC  Uvaldo Rising, BCPS  Clinical Pharmacist Pager [phone]  11/28/2016 12:45 PM

## 2016-11-28 NOTE — ED Notes (Signed)
Dr.Cardama and Dr.Nagpal at bedside for cardioversion

## 2016-11-28 NOTE — H&P (Signed)
History and Physical    [name] [name] NKN:[id number] DOB: 1961-10-26 DOA: 11/27/2016  Referring MD/NP/PA:   PCP: Pcp Not In System   Patient coming from:  The patient is coming from home.  At baseline, pt is independent for most of ADL.    Chief Complaint: Palpitation  HPI: [name] [name] is a 55 y.o. male with medical history significant of chronic diastolic heart failure, hypertension, hyperlipidemia, paroxysmal atrial fibrillation, and ESRD on home hemodialysis,  who presents palpitation  Pt states that he start feeling palpitation and about 9 PM. He denies any other symptoms including fatigue, shortness of breath, chest pain, orthopnea, PND, lower extremity edema. No nausea, vomiting, diarrhea, symptoms of UTI or unilateral weakness. His initial EKG showed atrial fibrillation with heart rates as high as 180 bpm in ED. He was given Diltiazem 5 mg IV x 1 and then Diltiazem 10 mg IV x 1 with minimal effect. Cardiology was consulted, Dr. Lucy Chris did DCCV.  He is successfully converted into sinus rhythm with heart rate down to 70-80s. Patient tolerated the procedure well, no new symptoms.  ED Course: pt was found to have WBC 9.6, Creatinine 5.43, potassium 3.1, INR 1.01. TSH is 2.626 and free T4 is 0.87. EKG showed A. fib with RVR. Pt is placed on SDU for obs.    Review of Systems:   General: no fevers, chills, no changes in body weight HEENT: no blurry vision, hearing changes or sore throat Respiratory: no dyspnea, coughing, wheezing CV: no chest pain, has palpitations GI: no nausea, vomiting, abdominal pain, diarrhea, constipation GU: no dysuria, burning on urination, increased urinary frequency, hematuria  Ext: no leg edema Neuro: no unilateral weakness, numbness, or tingling, no vision change or hearing loss Skin: no rash, no skin tear. MSK: No muscle spasm, no deformity, no limitation of range of movement in spin Heme: No easy bruising.  Travel history: No recent long  distant travel.  Allergy: No Known Allergies  Past Medical History:  Diagnosis Date  . Arthritis   . Cataract    bilateral- right removed, left present  . Chronic diastolic CHF (congestive heart failure) ([place])   . Complication of anesthesia    problem with bladder not waking up after surgery-for right nephrectomy  . Dysrhythmia   . Enlarged kidney    right  . Gout   . Hyperlipidemia   . Hypertension   . PAF (paroxysmal atrial fibrillation) (HCC)    happens about every 6 years-sees nishan  . Renal insufficiency    KIDNEY DISEASE - FOLLOWED BY DR. Lorrene Reid NOTES ON CHART    Past Surgical History:  Procedure Laterality Date  . AV FISTULA PLACEMENT     "IT CLOSED UP AND DOES NOT WORK"  . AV FISTULA PLACEMENT Right 10/27/2014   Procedure: ARTERIOVENOUS (AV) FISTULA CREATION;  Surgeon: Conrad Hardy, MD;  Location: [place];  Service: Vascular;  Laterality: Right;  . AV FISTULA PLACEMENT Right 08/22/2015   Procedure: BRACHIOCEPHALIC ARTERIOVENOUS (AV) FISTULA CREATION;  Surgeon: Conrad [place], MD;  Location: [place];  Service: Vascular;  Laterality: Right;  . CATARACTS REMOVED Right   . CHOLECYSTECTOMY N/A 07/13/2015   Procedure: LAPAROSCOPIC CHOLECYSTECTOMY WITH INTRAOPERATIVE CHOLANGIOGRAM;  Surgeon: Johnathan Hausen, MD;  [facility];  Service: General;  Laterality: N/A;  . EYE SURGERY     left cataract surgery-lens implant  . HERNIA REPAIR    . INSERTION OF MESH N/A 06/20/2014   Procedure: INSERTION OF MESH;  Surgeon: Isabel Caprice  Hassell Done, MD;  [facility];  Service: General;  Laterality: N/A;  . KNEE SURGERY  2001  . LAPAROSCOPIC GASTRIC BANDING N/A 06/20/2014   Procedure: LAPAROSCOPIC GASTRIC BANDING and VENTRAL HERNIA REPAIR WITH MESH;  Surgeon: Pedro Earls, MD;  [facility];  Service: General;  Laterality: N/A;  . TOTAL NEPHRECTOMY Right     Social History:  reports that he has never smoked. He has never used smokeless tobacco. He reports that he does not drink  alcohol or use drugs.  Family History:  Family History  Problem Relation Age of Onset  . Hypertension Mother   . Heart disease Mother     before age 26  . Cancer Father     bone  . Cancer Sister     breast  . Cancer Sister     breast  . CAD    . Colon cancer Neg Hx   . Rectal cancer Neg Hx   . Stomach cancer Neg Hx      Prior to Admission medications   Medication Sig Start Date End Date Taking? Authorizing Provider  aspirin EC 81 MG tablet Take 81 mg by mouth every evening.    Yes Historical Provider, MD  atorvastatin (LIPITOR) 10 MG tablet Take 10 mg by mouth every evening.    Yes Historical Provider, MD  b complex-vitamin c-folic acid (NEPHRO-VITE) 0.8 MG TABS tablet Take 1 tablet by mouth every evening.   Yes Historical Provider, MD  metoprolol tartrate (LOPRESSOR) 25 MG tablet Take 25 mg by mouth every evening.   Yes Historical Provider, MD    Physical Exam: Vitals:   11/28/16 0200 11/28/16 0215 11/28/16 0230 11/28/16 0245  BP: 103/72 110/68 111/73 112/73  Pulse: 74 72 75 75  Resp: 20   13  Temp:      TempSrc:      SpO2: 97% 97% 97% 100%   General: Not in acute distress HEENT:       Eyes: PERRL, EOMI, no scleral icterus.       ENT: No discharge from the ears and nose, no pharynx injection, no tonsillar enlargement.        Neck: No JVD, no bruit, no mass felt. Heme: No neck lymph node enlargement. Cardiac: S1/S2, Irregularly irregular rhythm, No murmurs, No gallops or rubs. Respiratory: No rales, wheezing, rhonchi or rubs. GI: Soft, nondistended, nontender, no rebound pain, no organomegaly, BS present. GU: No hematuria Ext: No pitting leg edema bilaterally. 2+DP/PT pulse bilaterally. His functional AVF in right arm. Musculoskeletal: No joint deformities, No joint redness or warmth, no limitation of ROM in spin. Skin: No rashes.  Neuro: Alert, oriented X3, cranial nerves II-XII grossly intact, moves all extremities normally.  Psych: Patient is not psychotic, no  suicidal or hemocidal ideation.  Labs on Admission: I have personally reviewed following labs and imaging studies  CBC:  Recent Labs Lab 11/27/16 2159  WBC 9.6  NEUTROABS 5.6  HGB 11.8*  HCT 34.4*  MCV 96.4  PLT 092   Basic Metabolic Panel:  Recent Labs Lab 11/27/16 2159  NA 135  K 3.1*  CL 98*  CO2 23  GLUCOSE 106*  BUN 22*  CREATININE 5.43*  CALCIUM 9.2  MG 1.9   GFR: CrCl cannot be calculated (Unknown ideal weight.). Liver Function Tests:  Recent Labs Lab 11/27/16 2159  AST 26  ALT 26  ALKPHOS 56  BILITOT 1.0  PROT 7.3  ALBUMIN 3.9   No results for input(s): LIPASE, AMYLASE in  the last 168 hours. No results for input(s): AMMONIA in the last 168 hours. Coagulation Profile:  Recent Labs Lab 11/27/16 2159  INR 1.01   Cardiac Enzymes: No results for input(s): CKTOTAL, CKMB, CKMBINDEX, TROPONINI in the last 168 hours. BNP (last 3 results) No results for input(s): PROBNP in the last 8760 hours. HbA1C: No results for input(s): HGBA1C in the last 72 hours. CBG: No results for input(s): GLUCAP in the last 168 hours. Lipid Profile: No results for input(s): CHOL, HDL, LDLCALC, TRIG, CHOLHDL, LDLDIRECT in the last 72 hours. Thyroid Function Tests:  Recent Labs  11/27/16 2220  TSH 2.626  FREET4 0.87   Anemia Panel: No results for input(s): VITAMINB12, FOLATE, FERRITIN, TIBC, IRON, RETICCTPCT in the last 72 hours. Urine analysis: No results found for: COLORURINE, APPEARANCEUR, LABSPEC, PHURINE, GLUCOSEU, HGBUR, BILIRUBINUR, KETONESUR, PROTEINUR, UROBILINOGEN, NITRITE, LEUKOCYTESUR Sepsis Labs: @LABRCNTIP (procalcitonin:4,lacticidven:4) )No results found for this or any previous visit (from the past 240 hour(s)).   Radiological Exams on Admission: No results found.   EKG: Independently reviewed.  Atrial fibrillation, QTC 509.  Assessment/Plan Principal Problem:   Atrial fibrillation with RVR (HCC) Active Problems:   Hypertension   ESRD on  dialysis (HCC)   Chronic diastolic CHF (congestive heart failure) (HCC)   Hypokalemia   HLD (hyperlipidemia)   Atrial fibrillation with RVR [facility]): s/p of success DCCV. Currently asymptomatic. TSH is 2.626 and free T4 is 0.87 today. CHADS2Vasc 2 (history of chronic diastolic heart failure and HTN).   - will admit to SDU for obs - highly appreciated cardiologist consultation, will follow up recommendations as follows: 1. Increase Metoprolol to 25 mg twice daily 2. Heparin bridge to Coumadin, goal INR 2-3.  -will get trop x 3 and 2d echo -check UDS, A1c and FLP  Hypertension: -Continue metoprolol  ESRD on home dialysis (MWF): pt is currently being worked up for a renal transplant by [facility]. Potassium is 3.1, bicarbonate 23, creatinine 5.3, BUN 22. -left messages to renal box for dialysis today  Hypokalemia: K= 3.1 on admission. Mg 1.9 - Repleted k - give 1 g of magnesium sulfate  HLD: Last LDL was not on record -Continue home medications: lipitor -Check FLP  Chronic diastolic CHF (congestive heart failure) ([place]): 2-D echo on 11/60/14 showed EF 60-65% with grade 2 diastolic dysfunction. Patient does not have leg edema or JVD. CHF compensated. -volume management through dialysis  DVT ppx: On IV Heparin  Code Status: Full code Family Communication: Yes, patient's wife at bed side Disposition Plan:  Anticipate discharge back to previous home environment Consults called: Card, Dr. Rise Patience  Admission status: SDU/obs  Date of Service 11/28/2016    Ivor Costa [facility] Pager [phone]  If 7PM-7AM, please contact night-coverage [email or web address] Password [facility] 11/28/2016, 3:27 AM

## 2016-11-28 NOTE — ED Notes (Signed)
Dr Niu at bedside 

## 2016-11-28 NOTE — Consult Note (Signed)
Renal Service Consult Note [name] [name]  [name] [name] 11/28/2016 [name] [name] Requesting Physician:  Dr Waldron Labs  Reason for Consult:  ESRD pt with afib/ RVR HPI: The patient is a 55 y.o. year-old with history of gout, HTN and ESRD.  Hx lap chole in 2016, gastric lap band in 2015 and afib/ RVR in 2012.  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 Started home HD in June 2017, does it 5 days per week, his wife is responsible for the procedure/ cannulation etc.    Patient admitted yesterday for palpitations and afib with RVR.  Heart rate wa sin the 180's, he underwent DCCV in the ED and is now in NSR.  He is getting IV hep and was started on coumadin last night.  Had hx of afib /RVR in 2012 but has not taken any coumadin for this before.    He denies any SOB, leg swelling, HD issues recently.  Makes small amts of urine.  No cough, CP, fevers or chills.    ROS  denies CP  no joint pain   no HA  no blurry vision  no rash  no diarrhea  no nausea/ vomiting  no dysuria  no difficulty voiding  no change in urine color    Past Medical History  Past Medical History:  Diagnosis Date  . Arthritis   . Cataract    bilateral- right removed, left present  . Chronic diastolic CHF (congestive heart failure) ([place])   . Complication of anesthesia    problem with bladder not waking up after surgery-for right nephrectomy  . Dysrhythmia   . Enlarged kidney    right  . Gout   . Hyperlipidemia   . Hypertension   . PAF (paroxysmal atrial fibrillation) (HCC)    happens about every 6 years-sees nishan  . Renal insufficiency    KIDNEY DISEASE - FOLLOWED BY DR. Lorrene Reid NOTES ON CHART   Past Surgical History  Past Surgical History:  Procedure Laterality Date  . AV FISTULA PLACEMENT     "IT CLOSED UP AND DOES NOT WORK"  . AV FISTULA PLACEMENT Right 10/27/2014    Procedure: ARTERIOVENOUS (AV) FISTULA CREATION;  Surgeon: Conrad Glendive, MD;  Location: [place];  Service: Vascular;  Laterality: Right;  . AV FISTULA PLACEMENT Right 08/22/2015   Procedure: BRACHIOCEPHALIC ARTERIOVENOUS (AV) FISTULA CREATION;  Surgeon: Conrad Rouseville, MD;  Location: [place];  Service: Vascular;  Laterality: Right;  . CATARACTS REMOVED Right   . CHOLECYSTECTOMY N/A 07/13/2015   Procedure: LAPAROSCOPIC CHOLECYSTECTOMY WITH INTRAOPERATIVE CHOLANGIOGRAM;  Surgeon: Johnathan Hausen, MD;  [facility];  Service: General;  Laterality: N/A;  . EYE SURGERY     left cataract surgery-lens implant  . HERNIA REPAIR    . INSERTION OF MESH N/A 06/20/2014   Procedure: INSERTION OF MESH;  Surgeon: Pedro Earls, MD;  [facility];  Service: General;  Laterality: N/A;  . KNEE SURGERY  2001  . LAPAROSCOPIC GASTRIC BANDING N/A 06/20/2014   Procedure: LAPAROSCOPIC GASTRIC BANDING and VENTRAL HERNIA REPAIR WITH MESH;  Surgeon: Pedro Earls, MD;  [facility];  Service: General;  Laterality: N/A;  . TOTAL NEPHRECTOMY Right    Family History  Family History  Problem Relation Age of Onset  . Hypertension Mother   . Heart disease Mother     before age 81  .  Cancer Father     bone  . Cancer Sister     breast  . Cancer Sister     breast  . CAD    . Colon cancer Neg Hx   . Rectal cancer Neg Hx   . Stomach cancer Neg Hx    Social History  reports that he has never smoked. He has never used smokeless tobacco. He reports that he does not drink alcohol or use drugs. Allergies No Known Allergies Home medications Prior to Admission medications   Medication Sig Start Date End Date Taking? Authorizing Provider  aspirin EC 81 MG tablet Take 81 mg by mouth every evening.    Yes Historical Provider, MD  atorvastatin (LIPITOR) 10 MG tablet Take 10 mg by mouth every evening.    Yes Historical Provider, MD  b complex-vitamin c-folic acid (NEPHRO-VITE) 0.8 MG TABS tablet Take 1 tablet by mouth  every evening.   Yes Historical Provider, MD  metoprolol tartrate (LOPRESSOR) 25 MG tablet Take 25 mg by mouth every evening.   Yes Historical Provider, MD   Liver Function Tests  Recent Labs Lab 11/27/16 2159  AST 26  ALT 26  ALKPHOS 56  BILITOT 1.0  PROT 7.3  ALBUMIN 3.9   No results for input(s): LIPASE, AMYLASE in the last 168 hours. CBC  Recent Labs Lab 11/27/16 2159  WBC 9.6  NEUTROABS 5.6  HGB 11.8*  HCT 34.4*  MCV 96.4  PLT 859   Basic Metabolic Panel  Recent Labs Lab 11/27/16 2159 11/28/16 0346  NA 135 137  K 3.1* 3.3*  CL 98* 100*  CO2 23 27  GLUCOSE 106* 123*  BUN 22* 23*  CREATININE 5.43* 5.83*  CALCIUM 9.2 8.9   Iron/TIBC/Ferritin/ %Sat No results found for: IRON, TIBC, FERRITIN, IRONPCTSAT  Vitals:   11/28/16 0315 11/28/16 0400 11/28/16 0748 11/28/16 1208  BP: 107/69  113/66 110/72  Pulse: 73  71 65  Resp: 14  16 19   Temp:  98.2 F (36.8 C) 98.5 F (36.9 C) 98.3 F (36.8 C)  TempSrc:  Oral Oral Oral  SpO2: 99%  96% 97%  Weight:  126.1 kg (278 lb 1.6 oz)    Height:  5\' 10"  (1.778 m)     Exam Gen alert, no distress, calm No rash, cyanosis or gangrene Sclera anicteric, throat clear  No jvd or bruits Chest clear bilat RRR no MRG Abd soft ntnd no mass or ascites +bs GU normal male MS no joint effusions or deformity Ext trace LE edema / no wounds or ulcers Neuro is alert, Ox 3 , nf AVF +bruit RUA    Dialysis: Home HD [facility], 5 days per week, 3.5 hr each, dry wt 123kg, 5000 hep, 15ga sharp needles  Assessment: 1. Afib with RVR - sp DCCV last night.  In NSR now, getting IV hep and coumadin 2. ESRD on home HD, started in June this year. Plan HD Sat then Monday, usual time and dry wt. Wife is to place needles.   3. HTN on metoprolol 4. Hx R nephrectomy for massive R hydro - done to make room for transplant in the future 5. Hx obesity sp gastric lapband  Plan - as above  Kelly Splinter MD [name] pager  [phone]   11/28/2016, 1:07 PM

## 2016-11-28 NOTE — Progress Notes (Signed)
[facility] for Heparin and Coumadin  Indication: atrial fibrillation  No Known Allergies  Patient Measurements: Height: 5\' 10"  (177.8 cm) Weight: 278 lb 1.6 oz (126.1 kg) IBW/kg (Calculated) : 73  Ht: 70 in  Wt: 126 kg  IBW: 73 kg Heparin Dosing Weight: 101 kg  Vital Signs: Temp: 97.9 F (36.6 C) (12/29 1634) Temp Source: Oral (12/29 1634) BP: 115/74 (12/29 1634) Pulse Rate: 72 (12/29 1634)  Labs:  Recent Labs  11/27/16 2159 11/28/16 0346 11/28/16 1010 11/28/16 1600 11/28/16 1909  HGB 11.8*  --   --   --  10.4*  HCT 34.4*  --   --   --  30.3*  PLT 151  --   --   --  165  LABPROT 13.3 15.1  --   --   --   INR 1.01 1.18  --   --   --   HEPARINUNFRC  --   --  0.25*  --  0.24*  CREATININE 5.43* 5.83*  --   --   --   TROPONINI  --  0.06* 0.11* 0.11*  --     Estimated Creatinine Clearance: 18.6 mL/min (by C-G formula based on SCr of 5.83 mg/dL (H)).   Medical History: Past Medical History:  Diagnosis Date  . Arthritis   . Cataract    bilateral- right removed, left present  . Chronic diastolic CHF (congestive heart failure) ([place])   . Complication of anesthesia    problem with bladder not waking up after surgery-for right nephrectomy  . Dysrhythmia   . Enlarged kidney    right  . Gout   . Hyperlipidemia   . Hypertension   . PAF (paroxysmal atrial fibrillation) (HCC)    happens about every 6 years-sees nishan  . Renal insufficiency    KIDNEY DISEASE - FOLLOWED BY DR. Lorrene Reid NOTES ON CHART   . heparin 1,750 Units/hr (11/28/16 1543)     Assessment: 56 y.o. M presents with new onset afib with RVR. S/p DCCV in ED 12/29. Now to begin heparin bridge to coumadin. CBC ok on admission. Pt with ESRD - waiting on kidney transplant. Baseline INR 1.01.  Heparin level slightly subtherapeutic after heparin drip increased to 1750 units/hr.  No bleeding or complications noted.  Goal of Therapy:  Heparin level 0.3-0.7 units/ml; INR  2-3 Monitor platelets by anticoagulation protocol: Yes   Plan:  Increase IV heparin gtt to 1950 units/hr. Recheck heparin level in AM. Warfarin 7.5mg  given tonight Daily heparin level, INR, and CBC  Eudelia Bunch, Pharm.D. [phone] 11/28/2016 8:11 PM

## 2016-11-29 DIAGNOSIS — Z992 Dependence on renal dialysis: Secondary | ICD-10-CM

## 2016-11-29 DIAGNOSIS — I480 Paroxysmal atrial fibrillation: Principal | ICD-10-CM

## 2016-11-29 DIAGNOSIS — I4891 Unspecified atrial fibrillation: Secondary | ICD-10-CM

## 2016-11-29 DIAGNOSIS — N186 End stage renal disease: Secondary | ICD-10-CM

## 2016-11-29 LAB — CBC
HEMATOCRIT: 30.4 % — AB (ref 39.0–52.0)
Hemoglobin: 10.4 g/dL — ABNORMAL LOW (ref 13.0–17.0)
MCH: 32.6 pg (ref 26.0–34.0)
MCHC: 34.2 g/dL (ref 30.0–36.0)
MCV: 95.3 fL (ref 78.0–100.0)
PLATELETS: 167 10*3/uL (ref 150–400)
RBC: 3.19 MIL/uL — ABNORMAL LOW (ref 4.22–5.81)
RDW: 13.3 % (ref 11.5–15.5)
WBC: 7.6 10*3/uL (ref 4.0–10.5)

## 2016-11-29 LAB — BASIC METABOLIC PANEL
ANION GAP: 11 (ref 5–15)
BUN: 37 mg/dL — AB (ref 6–20)
CALCIUM: 9.3 mg/dL (ref 8.9–10.3)
CO2: 25 mmol/L (ref 22–32)
Chloride: 102 mmol/L (ref 101–111)
Creatinine, Ser: 7.05 mg/dL — ABNORMAL HIGH (ref 0.61–1.24)
GFR calc Af Amer: 9 mL/min — ABNORMAL LOW (ref >60)
GFR calc non Af Amer: 8 mL/min — ABNORMAL LOW (ref >60)
GLUCOSE: 98 mg/dL (ref 65–99)
POTASSIUM: 3.4 mmol/L — AB (ref 3.5–5.1)
Sodium: 138 mmol/L (ref 135–145)

## 2016-11-29 LAB — RAPID URINE DRUG SCREEN, HOSP PERFORMED
Amphetamines: NOT DETECTED
BARBITURATES: NOT DETECTED
Benzodiazepines: POSITIVE — AB
Cocaine: NOT DETECTED
Opiates: NOT DETECTED
TETRAHYDROCANNABINOL: NOT DETECTED

## 2016-11-29 LAB — HEPARIN LEVEL (UNFRACTIONATED)
Heparin Unfractionated: 0.5 IU/mL (ref 0.30–0.70)
Heparin Unfractionated: 0.52 IU/mL (ref 0.30–0.70)

## 2016-11-29 LAB — PROTIME-INR
INR: 1.11
PROTHROMBIN TIME: 14.3 s (ref 11.4–15.2)

## 2016-11-29 MED ORDER — HEPARIN SODIUM (PORCINE) 1000 UNIT/ML DIALYSIS: 1000.0000 [IU] | INTRAMUSCULAR | Status: DC | PRN

## 2016-11-29 MED ORDER — PENTAFLUOROPROP-TETRAFLUOROETH EX AERO: 1.0000 "application " | INHALATION_SPRAY | CUTANEOUS | Status: DC | PRN

## 2016-11-29 MED ORDER — WARFARIN SODIUM 7.5 MG PO TABS
7.5000 mg | ORAL_TABLET | Freq: Once | ORAL | Status: AC
  Administered 2016-11-29: 7.5 mg via ORAL
  Filled 2016-11-29: qty 1

## 2016-11-29 MED ORDER — HEPARIN SODIUM (PORCINE) 1000 UNIT/ML DIALYSIS: 5000.0000 [IU] | Freq: Once | INTRAMUSCULAR | Status: DC

## 2016-11-29 MED ORDER — SODIUM CHLORIDE 0.9 % IV SOLN: 100.0000 mL | INTRAVENOUS | Status: DC | PRN

## 2016-11-29 MED ORDER — LIDOCAINE HCL (PF) 1 % IJ SOLN: 5.0000 mL | INTRAMUSCULAR | Status: DC | PRN

## 2016-11-29 MED ORDER — LIDOCAINE-PRILOCAINE 2.5-2.5 % EX CREA
1.0000 | TOPICAL_CREAM | CUTANEOUS | Status: DC | PRN
Start: 2016-11-29 — End: 2016-11-29

## 2016-11-29 MED ORDER — LIDOCAINE-PRILOCAINE 2.5-2.5 % EX CREA
TOPICAL_CREAM | Freq: Every day | CUTANEOUS | Status: DC | PRN
  Filled 2016-11-29: qty 5

## 2016-11-29 NOTE — Progress Notes (Addendum)
Patient Name: [name] [name] Date of Encounter: 11/29/2016  Primary Cardiologist: Dr [facility] Problem List     Principal Problem:   Atrial fibrillation with RVR [facility]) Active Problems:   Hypertension   ESRD on dialysis ([place])   Chronic diastolic CHF (congestive heart failure) (HCC)   Hypokalemia   HLD (hyperlipidemia)   Atrial fibrillation ([place])     Subjective   [name] [name] feels great again today. No chest pain, shortness of breath, palpitations or dizziness.  Inpatient Medications    Scheduled Meds: . aspirin EC  81 mg Oral QPM  . atorvastatin  10 mg Oral QPM  . metoprolol tartrate  25 mg Oral BID  . multivitamin  1 tablet Oral QHS  . pantoprazole  40 mg Oral Daily  . senna  1 tablet Oral Daily  . warfarin  7.5 mg Oral ONCE-1800  . Warfarin - Pharmacist Dosing Inpatient   Does not apply q1800   Continuous Infusions: . heparin 1,950 Units/hr (11/29/16 0548)   PRN Meds: acetaminophen, ALPRAZolam, lidocaine-prilocaine, zolpidem   Vital Signs    Vitals:   11/28/16 2300 11/29/16 0240 11/29/16 0300 11/29/16 0745  BP: 112/60 103/70 115/67 134/76  Pulse: 60 65 68 70  Resp: (!) 23 16 (!) 8 18  Temp: 97.6 F (36.4 C)  98 F (36.7 C) 98 F (36.7 C)  TempSrc: Oral  Oral Oral  SpO2: 98% 99% 99% 100%  Weight:      Height:        Intake/Output Summary (Last 24 hours) at 11/29/16 1010 Last data filed at 11/29/16 0900  Gross per 24 hour  Intake           831.77 ml  Output                0 ml  Net           831.77 ml   Filed Weights   11/28/16 0400  Weight: 278 lb 1.6 oz (126.1 kg)    Physical Exam   GEN: Well nourished, well developed, caucasian male in no acute distress.  HEENT: Grossly normal.  Neck: Supple, no JVD, carotid bruits, or masses. Cardiac: RRR, no murmurs, rubs, or gallops. No clubbing, cyanosis, edema.  Radials/DP/PT 2+ and equal bilaterally.  Respiratory:  Respirations regular and unlabored, clear to auscultation  bilaterally. GI: Soft, nontender, nondistended, BS + x 4. MS: no deformity or atrophy. Skin: warm and dry, no rash. AV fistula left arm Neuro:  Strength and sensation are intact. Psych: AAOx3.  Normal affect.  Labs    CBC  Recent Labs  11/27/16 2159 11/28/16 1909 11/29/16 0249  WBC 9.6 7.1 7.6  NEUTROABS 5.6  --   --   HGB 11.8* 10.4* 10.4*  HCT 34.4* 30.3* 30.4*  MCV 96.4 94.1 95.3  PLT 151 165 161   Basic Metabolic Panel  Recent Labs  11/27/16 2159 11/28/16 0346 11/29/16 0249  NA 135 137 138  K 3.1* 3.3* 3.4*  CL 98* 100* 102  CO2 23 27 25   GLUCOSE 106* 123* 98  BUN 22* 23* 37*  CREATININE 5.43* 5.83* 7.05*  CALCIUM 9.2 8.9 9.3  MG 1.9  --   --    Liver Function Tests  Recent Labs  11/27/16 2159  AST 26  ALT 26  ALKPHOS 56  BILITOT 1.0  PROT 7.3  ALBUMIN 3.9   No results for input(s): LIPASE, AMYLASE in the last 72 hours. Cardiac Enzymes  Recent Labs  11/28/16 0346 11/28/16 1010 11/28/16 1600  TROPONINI 0.06* 0.11* 0.11*   BNP Invalid input(s): POCBNP D-Dimer No results for input(s): DDIMER in the last 72 hours. Hemoglobin A1C  Recent Labs  11/28/16 0346  HGBA1C 5.2   Fasting Lipid Panel  Recent Labs  11/28/16 0346  CHOL 74  HDL 24*  LDLCALC 39  TRIG 56  CHOLHDL 3.1   Thyroid Function Tests  Recent Labs  11/27/16 2220  TSH 2.626    Telemetry    NSR with rates in the 60's, no ectopy - Personally Reviewed  ECG   11/27/16- atrial fibrillation with RVR 169 bpm 11/28/16 0044 Post DCCV: Sinus rhythm at 91 bpm minimal ST depression diffusely - Personally Reviewed  Radiology    No results found.  Cardiac Studies   Echo pending   Patient Profile     55 yo male with past medical history significant for chronic diastolic heart failure (grade 2 DD), hypertension, hyperlipidemia, PAF, obesity with laparoscopic gastric banding and ESRD on home hemodialysis since 07/2016 and on renal transplant list at [facility]  present to the ED on 11/27/16 for evaluation of new onset feeling of uneasiness in his chest and racing heart beat. He was found to be in atrial fibrillation with RVR at a rate of 169 bpm. Urgent cardioversion was completed with conversion to sinus rhythm. He has been started on heparin IV with bridging to Coumadin.  Assessment & Plan    1. Atrial fibrillation with rapid ventricular response -Symptomatic on arrival with chest uneasiness and feeling of rapid heartbeat. No chest pain, shortness of breath, dizziness or orthopnea. -Has had several episodes of PAF in the past that spontaneously terminated without treatment, no previous anticoagulation -Diltiazem 5 mg and 10 mg given with minimal effect. Urgent cardioversion with 200 joules X1 shock to sinus rhythm. Continues in sinus rhythm in the 60's -Metoprolol 25 mg increase from once every evening to bid -CHA2DS2-VASc score 1 (HTN) Pt needs 4 weeks of anticoagulation post DCCV and possible atrial stunning. With ESRD on dialysis he is not a candidate for NOAC. He is on IV heparin with bridge to coumadin which has been initiated. Monitor INR with goal 2-3. -Once INR therapeutic can stop aspirin -Echo is pending -Pt thinks he had a cardiac cath more than 10 years ago at [facility] and recalls that it was normal. Unable to find report.  2. Elevated troponins -Troponins 0.06, 0.11 due to demand ischemia in setting of afib with RVR and S/P DCCV  3. Hypertension -Well controlled on metoprolol  4. Hyperlipidemia -continue atorvastatin  5. ESRD -On hemodialysis and awaiting renal transplant -Nephrology following with plans for HD on Sat and Monday  Awaiting INR of 2 prior to DC. I will write transfer order to 6e.   Signed, Candee Furbish, MD  11/29/2016, 10:10 AM

## 2016-11-29 NOTE — Progress Notes (Signed)
[place] for Heparin and Coumadin  Indication: atrial fibrillation  No Known Allergies  Patient Measurements: Height: 5\' 10"  (177.8 cm) Weight: 278 lb 1.6 oz (126.1 kg) IBW/kg (Calculated) : 73  Ht: 70 in  Wt: 126 kg  IBW: 73 kg Heparin Dosing Weight: 101 kg  Vital Signs: Temp: 98 F (36.7 C) (12/30 0745) Temp Source: Oral (12/30 0745) BP: 134/76 (12/30 0745) Pulse Rate: 70 (12/30 0745)  Labs:  Recent Labs  11/27/16 2159 11/28/16 0346 11/28/16 1010 11/28/16 1600 11/28/16 1909 11/29/16 0249  HGB 11.8*  --   --   --  10.4* 10.4*  HCT 34.4*  --   --   --  30.3* 30.4*  PLT 151  --   --   --  165 167  LABPROT 13.3 15.1  --   --   --  14.3  INR 1.01 1.18  --   --   --  1.11  HEPARINUNFRC  --   --  0.25*  --  0.24* 0.50  CREATININE 5.43* 5.83*  --   --   --  7.05*  TROPONINI  --  0.06* 0.11* 0.11*  --   --     Estimated Creatinine Clearance: 15.4 mL/min (by C-G formula based on SCr of 7.05 mg/dL (H)).   Medical History: Past Medical History:  Diagnosis Date  . Arthritis   . Cataract    bilateral- right removed, left present  . Chronic diastolic CHF (congestive heart failure) ([place])   . Complication of anesthesia    problem with bladder not waking up after surgery-for right nephrectomy  . Dysrhythmia   . Enlarged kidney    right  . Gout   . Hyperlipidemia   . Hypertension   . PAF (paroxysmal atrial fibrillation) (HCC)    happens about every 6 years-sees nishan  . Renal insufficiency    KIDNEY DISEASE - FOLLOWED BY DR. Lorrene Reid NOTES ON CHART   . heparin 1,950 Units/hr (11/29/16 0548)     Assessment: 55 y.o. M presents with new onset afib with RVR. S/p DCCV in ED 12/29. Now to begin heparin bridge to coumadin. CBC ok on admission. Pt with ESRD - waiting on kidney transplant. Baseline INR 1.01. Heparin level therapeutic this AM. INR 1.11. CBC stable.   Goal of Therapy:  Heparin level 0.3-0.7 units/ml; INR 2-3 Monitor  platelets by anticoagulation protocol: Yes   Plan:  Continue IV heparin gtt at 1950 units/hr. Warfarin 7.5mg  po x 1 tonight 6hr heparin level Daily heparin level, INR, and CBC Monitor for S&S of bleed  Angela Burke, PharmD, BCPS Pharmacy Resident Pager: [phone] 11/29/2016 8:22 AM

## 2016-11-29 NOTE — Progress Notes (Signed)
[facility] PROGRESS NOTE  [name] [name] OMV:[id number] DOB: 07/03/1961 DOA: 11/27/2016  PCP: Pcp Not In System  Brief History/Interval Summary: [name] [name] a 55 y.o.malewith medical history significant of chronic diastolic heart failure, hypertension, hyperlipidemia, paroxysmal atrial fibrillation, and ESRD on home hemodialysis, who presented with palpitation and was found to be in A. fib with RVR heart rate in the 180s, did not respond to diltiazem, status post DCCV, successfully converted to sinus rhythm, on heparin bridge and warfarin target INR 2-3.   Reason for Visit: Paroxysmal atrial fibrillation  Consultants: Cardiology. Nephrology.  Procedures:  DC cardioversion  Transthoracic echocardiogram Study Conclusions - Left ventricle: The cavity size was mildly dilated. Wall   thickness was normal. Systolic function was normal. The estimated   ejection fraction was in the range of 60% to 65%. Wall motion was   normal; there were no regional wall motion abnormalities. Left   ventricular diastolic function parameters were normal. - Aortic valve: There was no stenosis. - Aorta: Mildly dilated aortic root and ascending aorta. Aortic   root dimension: 39 mm (ED). Ascending aortic diameter: 37 mm (S). - Mitral valve: There was trivial regurgitation. - Left atrium: The atrium was mildly dilated. - Right ventricle: The cavity size was normal. Systolic function   was normal. - Pulmonary arteries: PA peak pressure: 33 mm Hg (S). - Inferior vena cava: The vessel was normal in size. The   respirophasic diameter changes were in the normal range (>= 50%),   consistent with normal central venous pressure. Impressions: - Mildly dilated LV with EF 60-65%. Normal diastolic function.   Normal RV size and systolic function. No significant valvular   abnormalities.  Antibiotics: None  Subjective/Interval History: Patient denies any complaints this morning. He denies  any chest pain or shortness of breath. No nausea or vomiting.  ROS: no Headaches.  Objective:  Vital Signs  Vitals:   11/29/16 1209 11/29/16 1235 11/29/16 1244 11/29/16 1250  BP:  130/66 114/66 125/67  Pulse:  64 72 64  Resp:  12    Temp: 98.2 F (36.8 C) 98.1 F (36.7 C)    TempSrc: Oral Oral    SpO2:  100%    Weight:  125.6 kg (276 lb 14.4 oz)    Height:        Intake/Output Summary (Last 24 hours) at 11/29/16 1306 Last data filed at 11/29/16 1200  Gross per 24 hour  Intake          1039.34 ml  Output              200 ml  Net           839.34 ml   Filed Weights   11/28/16 0400 11/29/16 1235  Weight: 126.1 kg (278 lb 1.6 oz) 125.6 kg (276 lb 14.4 oz)    General appearance: alert, cooperative, appears stated age and no distress Resp: clear to auscultation bilaterally Cardio: regular rate and rhythm, S1, S2 normal, no murmur, click, rub or gallop GI: soft, non-tender; bowel sounds normal; hard mass identified in the upper abdomen consistent with patient's history of lap band.  no organomegaly Extremities: extremities normal, atraumatic, no cyanosis or edema Neurologic: Awake and alert. Oriented 3. No focal neurological deficits.  Lab Results:  Data Reviewed: I have personally reviewed following labs and imaging studies  CBC:  Recent Labs Lab 11/27/16 2159 11/28/16 1909 11/29/16 0249  WBC 9.6 7.1 7.6  NEUTROABS 5.6  --   --  HGB 11.8* 10.4* 10.4*  HCT 34.4* 30.3* 30.4*  MCV 96.4 94.1 95.3  PLT 151 165 793    Basic Metabolic Panel:  Recent Labs Lab 11/27/16 2159 11/28/16 0346 11/29/16 0249  NA 135 137 138  K 3.1* 3.3* 3.4*  CL 98* 100* 102  CO2 23 27 25   GLUCOSE 106* 123* 98  BUN 22* 23* 37*  CREATININE 5.43* 5.83* 7.05*  CALCIUM 9.2 8.9 9.3  MG 1.9  --   --     GFR: Estimated Creatinine Clearance: 15.4 mL/min (by C-G formula based on SCr of 7.05 mg/dL (H)).  Liver Function Tests:  Recent Labs Lab 11/27/16 2159  AST 26  ALT 26    ALKPHOS 56  BILITOT 1.0  PROT 7.3  ALBUMIN 3.9    Coagulation Profile:  Recent Labs Lab 11/27/16 2159 11/28/16 0346 11/29/16 0249  INR 1.01 1.18 1.11    Cardiac Enzymes:  Recent Labs Lab 11/28/16 0346 11/28/16 1010 11/28/16 1600  TROPONINI 0.06* 0.11* 0.11*    HbA1C:  Recent Labs  11/28/16 0346  HGBA1C 5.2    Lipid Profile:  Recent Labs  11/28/16 0346  CHOL 74  HDL 24*  LDLCALC 39  TRIG 56  CHOLHDL 3.1    Thyroid Function Tests:  Recent Labs  11/27/16 2220  TSH 2.626  FREET4 0.87     Recent Results (from the past 240 hour(s))  MRSA PCR Screening     Status: None   Collection Time: 11/28/16  4:28 AM  Result Value Ref Range Status   MRSA by PCR NEGATIVE NEGATIVE Final    Comment:        The GeneXpert MRSA Assay (FDA approved for NASAL specimens only), is one component of a comprehensive MRSA colonization surveillance program. It is not intended to diagnose MRSA infection nor to guide or monitor treatment for MRSA infections.       Radiology Studies: No results found.   Medications:  Scheduled: . aspirin EC  81 mg Oral QPM  . atorvastatin  10 mg Oral QPM  . metoprolol tartrate  25 mg Oral BID  . multivitamin  1 tablet Oral QHS  . pantoprazole  40 mg Oral Daily  . senna  1 tablet Oral Daily  . warfarin  7.5 mg Oral ONCE-1800  . Warfarin - Pharmacist Dosing Inpatient   Does not apply q1800   Continuous: . heparin 1,950 Units/hr (11/29/16 0548)   JQZ:[id number], ALPRAZolam, lidocaine-prilocaine, zolpidem  Assessment/Plan:  Principal Problem:   Atrial fibrillation with RVR (HCC) Active Problems:   Hypertension   ESRD on dialysis ([place])   Chronic diastolic CHF (congestive heart failure) (HCC)   Hypokalemia   HLD (hyperlipidemia)   Atrial fibrillation (HCC)    Paroxysmal atrial fibrillation Patient presented with a rapid ventricular response. Did not improve with diltiazem infusion. Patient was cardioverted.  Currently on heparin infusion. Cardiology is following. Patient is on a beta blocker, which will be continued. TSH is normal. Echocardiogram as above. CHADS2Vasc 2 (history of chronic diastolic heart failure and HTN), heparin bridge to warfarin with target INR 2-3. Patient will need anticoagulation for about 4 weeks per cardiology.  Elevated troponins Secondary to demand ischemia from A. Fib. Cardiology is following.  ESRD on home HD Nephrology consulted. For dialysis today and then on Monday.  Essential Hypertension Continue home medications. Blood pressure is reasonably well controlled.  Hyperlipidemia Continue with statin  Hx obesity sp gastric lapband Stable  DVT Prophylaxis: On IV heparin and oral warfarin  Code Status: Full code  Family Communication: Discussed with the patient  Disposition Plan: Dialysis today. Mobilize as tolerated. Await therapeutic INR. Okay for transfer to telemetry.    LOS: 1 day   [place] [facility] Pager [phone] 11/29/2016, 1:06 PM  If 7PM-7AM, please contact night-coverage at [email or web address], password [facility]

## 2016-11-29 NOTE — Progress Notes (Signed)
Pt gone to dialysis. [name] [name] Mikel Cella, BSN, MSN. 11/29/2016 @ 12.00 PM

## 2016-11-29 NOTE — Progress Notes (Signed)
  [facility] Progress Note   Subjective: doing well  Vitals:   11/28/16 2300 11/29/16 0240 11/29/16 0300 11/29/16 0745  BP: 112/60 103/70 115/67 134/76  Pulse: 60 65 68 70  Resp: (!) 23 16 (!) 8 18  Temp: 97.6 F (36.4 C)  98 F (36.7 C) 98 F (36.7 C)  TempSrc: Oral  Oral Oral  SpO2: 98% 99% 99% 100%  Weight:      Height:        Inpatient medications: . aspirin EC  81 mg Oral QPM  . atorvastatin  10 mg Oral QPM  . metoprolol tartrate  25 mg Oral BID  . multivitamin  1 tablet Oral QHS  . pantoprazole  40 mg Oral Daily  . senna  1 tablet Oral Daily  . warfarin  7.5 mg Oral ONCE-1800  . Warfarin - Pharmacist Dosing Inpatient   Does not apply q1800   . heparin 1,950 Units/hr (11/29/16 0548)   acetaminophen, ALPRAZolam, lidocaine-prilocaine, zolpidem  Exam: Gen alert, no distress, calm No rash, cyanosis or gangrene Sclera anicteric, throat clear  No jvd or bruits Chest clear bilat RRR no MRG Abd soft ntnd no mass or ascites +bs GU normal male MS no joint effusions or deformity Ext trace LE edema / no wounds or ulcers Neuro is alert, Ox 3 , nf AVF +bruit RUA  Dialysis: Home HD [facility], 5 days per week, 3.5 hr each, dry wt 123kg, 5000 hep, 15ga sharp needles  Assessment: 1. Afib with RVR - sp DCCV in ED 12/28.  In NSR now, getting IV hep and coumadin 2. ESRD on home HD, started in June this year. Plan HD Sat then Monday, usual time and dry wt. Wife is to place needles.   3. HTN on metoprolol 4. Hx R nephrectomy for massive R hydro - done to make room for transplant in the future 5. Hx obesity sp gastric lapband   Plan - HD today   Kelly Splinter MD [facility] pager [phone]   11/29/2016, 11:54 AM    Recent Labs Lab 11/27/16 2159 11/28/16 0346 11/29/16 0249  NA 135 137 138  K 3.1* 3.3* 3.4*  CL 98* 100* 102  CO2 23 27 25   GLUCOSE 106* 123* 98  BUN 22* 23* 37*  CREATININE 5.43* 5.83* 7.05*  CALCIUM 9.2 8.9 9.3     Recent Labs Lab 11/27/16 2159  AST 26  ALT 26  ALKPHOS 56  BILITOT 1.0  PROT 7.3  ALBUMIN 3.9    Recent Labs Lab 11/27/16 2159 11/28/16 1909 11/29/16 0249  WBC 9.6 7.1 7.6  NEUTROABS 5.6  --   --   HGB 11.8* 10.4* 10.4*  HCT 34.4* 30.3* 30.4*  MCV 96.4 94.1 95.3  PLT 151 165 167   Iron/TIBC/Ferritin/ %Sat No results found for: IRON, TIBC, FERRITIN, IRONPCTSAT

## 2016-11-29 NOTE — Progress Notes (Signed)
Back from dialysis. Laasya Peyton RN 11/29/2016 @17 .30

## 2016-11-29 NOTE — Progress Notes (Signed)
ANTICOAGULATION CONSULT NOTE  Pharmacy Consult for Heparin and Coumadin  Indication: atrial fibrillation  No Known Allergies  Patient Measurements: Height: 5\' 10"  (177.8 cm) Weight: 270 lb 11.6 oz (122.8 kg) IBW/kg (Calculated) : 73  Ht: 70 in  Wt: 126 kg  IBW: 73 kg Heparin Dosing Weight: 101 kg  Vital Signs: Temp: 98.4 F (36.9 C) (12/30 1614) Temp Source: Oral (12/30 1614) BP: 110/74 (12/30 1614) Pulse Rate: 75 (12/30 1614)  Labs:  Recent Labs  11/27/16 2159 11/28/16 0346  11/28/16 1010 11/28/16 1600 11/28/16 1909 11/29/16 0249 11/29/16 1804  HGB 11.8*  --   --   --   --  10.4* 10.4*  --   HCT 34.4*  --   --   --   --  30.3* 30.4*  --   PLT 151  --   --   --   --  165 167  --   LABPROT 13.3 15.1  --   --   --   --  14.3  --   INR 1.01 1.18  --   --   --   --  1.11  --   HEPARINUNFRC  --   --   < > 0.25*  --  0.24* 0.50 0.52  CREATININE 5.43* 5.83*  --   --   --   --  7.05*  --   TROPONINI  --  0.06*  --  0.11* 0.11*  --   --   --   < > = values in this interval not displayed.  Estimated Creatinine Clearance: 15.2 mL/min (by C-G formula based on SCr of 7.05 mg/dL (H)).   Medical History: Past Medical History:  Diagnosis Date  . Arthritis   . Cataract    bilateral- right removed, left present  . Chronic diastolic CHF (congestive heart failure) ([place])   . Complication of anesthesia    problem with bladder not waking up after surgery-for right nephrectomy  . Dysrhythmia   . Enlarged kidney    right  . Gout   . Hyperlipidemia   . Hypertension   . PAF (paroxysmal atrial fibrillation) (HCC)    happens about every 6 years-sees nishan  . Renal insufficiency    KIDNEY DISEASE - FOLLOWED BY DR. Lorrene Reid NOTES ON CHART   . heparin 1,950 Units/hr (11/29/16 1849)     Assessment: 55 y.o. M presents with new onset afib with RVR. S/p DCCV in ED 12/29. Now to begin heparin bridge to coumadin. CBC ok on admission. Pt with ESRD - waiting on kidney transplant.  Baseline INR 1.01. Heparin level therapeutic this AM. INR 1.11. CBC stable.   Repeat HL remains therapeutic at 0.52 on heparin 1950 units/hr. No issues with infusion or bleeding noted.  Goal of Therapy:  Heparin level 0.3-0.7 units/ml; INR 2-3 Monitor platelets by anticoagulation protocol: Yes   Plan:  Continue heparin 1950 units/hr Daily heparin level, INR, and CBC Monitor for S&S of bleed  Andrey Cota. Diona Foley, PharmD, [place] Clinical Pharmacist Pager [phone] 11/29/2016 7:17 PM

## 2016-11-30 LAB — CBC
HCT: 31.8 % — ABNORMAL LOW (ref 39.0–52.0)
Hemoglobin: 10.6 g/dL — ABNORMAL LOW (ref 13.0–17.0)
MCH: 31.7 pg (ref 26.0–34.0)
MCHC: 33.3 g/dL (ref 30.0–36.0)
MCV: 95.2 fL (ref 78.0–100.0)
PLATELETS: 161 10*3/uL (ref 150–400)
RBC: 3.34 MIL/uL — AB (ref 4.22–5.81)
RDW: 13.3 % (ref 11.5–15.5)
WBC: 7.7 10*3/uL (ref 4.0–10.5)

## 2016-11-30 LAB — PROTIME-INR
INR: 1.15
PROTHROMBIN TIME: 14.7 s (ref 11.4–15.2)

## 2016-11-30 LAB — HEPARIN LEVEL (UNFRACTIONATED): HEPARIN UNFRACTIONATED: 0.39 [IU]/mL (ref 0.30–0.70)

## 2016-11-30 LAB — HEPATITIS B SURFACE ANTIGEN: Hepatitis B Surface Ag: NEGATIVE

## 2016-11-30 MED ORDER — WARFARIN SODIUM 7.5 MG PO TABS
7.5000 mg | ORAL_TABLET | Freq: Once | ORAL | Status: AC
  Administered 2016-11-30: 7.5 mg via ORAL
  Filled 2016-11-30: qty 1

## 2016-11-30 NOTE — Progress Notes (Signed)
[place] for Heparin and Coumadin  Indication: atrial fibrillation  No Known Allergies  Patient Measurements: Height: 5\' 10"  (177.8 cm) Weight: 269 lb 6.4 oz (122.2 kg) IBW/kg (Calculated) : 73  Ht: 70 in  Wt: 126 kg  IBW: 73 kg Heparin Dosing Weight: 101 kg  Vital Signs: Temp: 98.7 F (37.1 C) (12/31 0327) Temp Source: Oral (12/31 0327) BP: 96/58 (12/31 0327) Pulse Rate: 69 (12/30 2109)  Labs:  Recent Labs  11/27/16 2159 11/28/16 0346  11/28/16 1010 11/28/16 1600 11/28/16 1909 11/29/16 0249 11/29/16 1804 11/30/16 0234  HGB 11.8*  --   --   --   --  10.4* 10.4*  --  10.6*  HCT 34.4*  --   --   --   --  30.3* 30.4*  --  31.8*  PLT 151  --   --   --   --  165 167  --  161  LABPROT 13.3 15.1  --   --   --   --  14.3  --  14.7  INR 1.01 1.18  --   --   --   --  1.11  --  1.15  HEPARINUNFRC  --   --   < > 0.25*  --  0.24* 0.50 0.52 0.39  CREATININE 5.43* 5.83*  --   --   --   --  7.05*  --   --   TROPONINI  --  0.06*  --  0.11* 0.11*  --   --   --   --   < > = values in this interval not displayed.  Estimated Creatinine Clearance: 15.2 mL/min (by C-G formula based on SCr of 7.05 mg/dL (H)).   Medical History: Past Medical History:  Diagnosis Date  . Arthritis   . Cataract    bilateral- right removed, left present  . Chronic diastolic CHF (congestive heart failure) ([place])   . Complication of anesthesia    problem with bladder not waking up after surgery-for right nephrectomy  . Dysrhythmia   . Enlarged kidney    right  . Gout   . Hyperlipidemia   . Hypertension   . PAF (paroxysmal atrial fibrillation) (HCC)    happens about every 6 years-sees nishan  . Renal insufficiency    KIDNEY DISEASE - FOLLOWED BY DR. Lorrene Reid NOTES ON CHART   . heparin 1,950 Units/hr (11/30/16 7253)     Assessment: 55 y.o. M presents with new onset afib with RVR. S/p DCCV in ED 12/29. Now to begin heparin bridge to coumadin. Pt with ESRD -  waiting on kidney transplant. Baseline INR 1.01. Heparin level therapeutic this AM. INR 1.15. CBC stable.    Goal of Therapy:  Heparin level 0.3-0.7 units/ml; INR 2-3 Monitor platelets by anticoagulation protocol: Yes   Plan:  Continue heparin 1950 units/hr Warfarin 7.5mg  po x 1 tonight Daily heparin level, INR, and CBC Monitor for S&S of bleed  Angela Burke, PharmD, BCPS Pharmacy Resident Pager: [phone] 11/30/2016 7:49 AM

## 2016-11-30 NOTE — Progress Notes (Signed)
[facility] PROGRESS NOTE  ELDAR ROBITAILLE FIE:[id number] DOB: 10-Nov-1961 DOA: 11/27/2016  PCP: Pcp Not In System  Brief History/Interval Summary: [name] [name] a 55 y.o.malewith medical history significant of chronic diastolic heart failure, hypertension, hyperlipidemia, paroxysmal atrial fibrillation, and ESRD on home hemodialysis, who presented with palpitation and was found to be in A. fib with RVR heart rate in the 180s, did not respond to diltiazem, status post DCCV, successfully converted to sinus rhythm, on heparin bridge and warfarin target INR 2-3.   Reason for Visit: Paroxysmal atrial fibrillation  Consultants: Cardiology. Nephrology.  Procedures:  DC cardioversion  Transthoracic echocardiogram Study Conclusions - Left ventricle: The cavity size was mildly dilated. Wall   thickness was normal. Systolic function was normal. The estimated   ejection fraction was in the range of 60% to 65%. Wall motion was   normal; there were no regional wall motion abnormalities. Left   ventricular diastolic function parameters were normal. - Aortic valve: There was no stenosis. - Aorta: Mildly dilated aortic root and ascending aorta. Aortic   root dimension: 39 mm (ED). Ascending aortic diameter: 37 mm (S). - Mitral valve: There was trivial regurgitation. - Left atrium: The atrium was mildly dilated. - Right ventricle: The cavity size was normal. Systolic function   was normal. - Pulmonary arteries: PA peak pressure: 33 mm Hg (S). - Inferior vena cava: The vessel was normal in size. The   respirophasic diameter changes were in the normal range (>= 50%),   consistent with normal central venous pressure. Impressions: - Mildly dilated LV with EF 60-65%. Normal diastolic function.   Normal RV size and systolic function. No significant valvular   abnormalities.  Antibiotics: None  Subjective/Interval History: Patient denies any complaints. Specifically denies  any chest pain or shortness of breath. No nausea or vomiting. Denies any bleeding.   ROS: Denies headaches.  Objective:  Vital Signs  Vitals:   11/29/16 2014 11/29/16 2109 11/29/16 2306 11/30/16 0327  BP:  120/71 114/67 (!) 96/58  Pulse:  69    Resp: 17  (!) 23 20  Temp:   97.6 F (36.4 C) 98.7 F (37.1 C)  TempSrc:   Oral Oral  SpO2:   97% 100%  Weight:    122.2 kg (269 lb 6.4 oz)  Height:        Intake/Output Summary (Last 24 hours) at 11/30/16 0750 Last data filed at 11/30/16 1660  Gross per 24 hour  Intake            933.7 ml  Output             2802 ml  Net          -1868.3 ml   Filed Weights   11/29/16 1235 11/29/16 1614 11/30/16 0327  Weight: 125.6 kg (276 lb 14.4 oz) 122.8 kg (270 lb 11.6 oz) 122.2 kg (269 lb 6.4 oz)    General appearance: alert, cooperative, appears stated age and no distress Resp: clear to auscultation bilaterally Cardio: regular rate and rhythm, S1, S2 normal, no murmur, click, rub or gallop GI: soft, non-tender; bowel sounds normal; hard mass identified in the upper abdomen consistent with patient's history of lap band.  no organomegaly Extremities: extremities normal, atraumatic, no cyanosis or edema Neurologic: Awake and alert. Oriented 3. No focal neurological deficits.  Lab Results:  Data Reviewed: I have personally reviewed following labs and imaging studies  CBC:  Recent Labs Lab 11/27/16 2159 11/28/16 1909 11/29/16 0249 11/30/16 0234  WBC 9.6 7.1 7.6 7.7  NEUTROABS 5.6  --   --   --   HGB 11.8* 10.4* 10.4* 10.6*  HCT 34.4* 30.3* 30.4* 31.8*  MCV 96.4 94.1 95.3 95.2  PLT 151 165 167 497    Basic Metabolic Panel:  Recent Labs Lab 11/27/16 2159 11/28/16 0346 11/29/16 0249  NA 135 137 138  K 3.1* 3.3* 3.4*  CL 98* 100* 102  CO2 23 27 25   GLUCOSE 106* 123* 98  BUN 22* 23* 37*  CREATININE 5.43* 5.83* 7.05*  CALCIUM 9.2 8.9 9.3  MG 1.9  --   --     GFR: Estimated Creatinine Clearance: 15.2 mL/min (by C-G  formula based on SCr of 7.05 mg/dL (H)).  Liver Function Tests:  Recent Labs Lab 11/27/16 2159  AST 26  ALT 26  ALKPHOS 56  BILITOT 1.0  PROT 7.3  ALBUMIN 3.9    Coagulation Profile:  Recent Labs Lab 11/27/16 2159 11/28/16 0346 11/29/16 0249 11/30/16 0234  INR 1.01 1.18 1.11 1.15    Cardiac Enzymes:  Recent Labs Lab 11/28/16 0346 11/28/16 1010 11/28/16 1600  TROPONINI 0.06* 0.11* 0.11*    HbA1C:  Recent Labs  11/28/16 0346  HGBA1C 5.2    Lipid Profile:  Recent Labs  11/28/16 0346  CHOL 74  HDL 24*  LDLCALC 39  TRIG 56  CHOLHDL 3.1    Thyroid Function Tests:  Recent Labs  11/27/16 2220  TSH 2.626  FREET4 0.87     Recent Results (from the past 240 hour(s))  MRSA PCR Screening     Status: None   Collection Time: 11/28/16  4:28 AM  Result Value Ref Range Status   MRSA by PCR NEGATIVE NEGATIVE Final    Comment:        The GeneXpert MRSA Assay (FDA approved for NASAL specimens only), is one component of a comprehensive MRSA colonization surveillance program. It is not intended to diagnose MRSA infection nor to guide or monitor treatment for MRSA infections.       Radiology Studies: No results found.   Medications:  Scheduled: . aspirin EC  81 mg Oral QPM  . atorvastatin  10 mg Oral QPM  . metoprolol tartrate  25 mg Oral BID  . multivitamin  1 tablet Oral QHS  . pantoprazole  40 mg Oral Daily  . senna  1 tablet Oral Daily  . Warfarin - Pharmacist Dosing Inpatient   Does not apply q1800   Continuous: . heparin 1,950 Units/hr (11/30/16 0616)   WYO:[id number], ALPRAZolam, lidocaine-prilocaine, zolpidem  Assessment/Plan:  Principal Problem:   Atrial fibrillation with RVR (HCC) Active Problems:   Hypertension   ESRD on dialysis ([place])   Chronic diastolic CHF (congestive heart failure) (HCC)   Hypokalemia   HLD (hyperlipidemia)   Atrial fibrillation (HCC)    Paroxysmal atrial fibrillation Patient presented  with a rapid ventricular response. Did not improve with diltiazem infusion. Patient was cardioverted. Currently on heparin infusion. Cardiology is following. Patient is on a beta blocker, which will be continued. TSH is normal. Echocardiogram as above. CHADS2Vasc 2 (history of chronic diastolic heart failure and HTN), heparin bridge to warfarin with target INR 2-3. Patient will need anticoagulation for about 4 weeks per cardiology. Await therapeutic INR.  Elevated troponins Secondary to demand ischemia from A. Fib. Cardiology is following. No plans for ischemic workup in the hospital.  ESRD on home HD Nephrology consulted. Patient was dialyzed on Saturday. Next dialysis on Monday.   Essential Hypertension  Continue home medications. Blood pressure is reasonably well controlled.  Hyperlipidemia Continue with statin  Hx obesity sp gastric lapband Stable  DVT Prophylaxis: On IV heparin and oral warfarin    Code Status: Full code  Family Communication: Discussed with the patient  Disposition Plan: Mobilize as tolerated. Await therapeutic INR. Okay for transfer to telemetry.    LOS: 2 days   [place] [facility] Pager [phone] 11/30/2016, 7:50 AM  If 7PM-7AM, please contact night-coverage at [email or web address], password [facility]

## 2016-11-30 NOTE — Progress Notes (Signed)
[facility] Progress Note   Dialysis Orders: Home HD NxtStage, 5 days per week, 3.5 hr each, dry wt 123kg, 5000 hep, 15ga sharp needles  Assessment/Plan: 1. Afib with RVR - sp DCCV in ED 12/28. In NSR now, getting IV hep and coumadin= slow response to coumadin; he would be an excellent candidate to check home INRs- plans per cards 2. ESRD on home HD, started in June this year. Plan HD Monday, usual time and dry wt. Wife may place needles - do 4 hr 3 x a week while here K 3.4 yesterday - start on 4 K bath Monday 3. HTN/vol on metoprolol - ^ to bid - net UF 2.6  12/30 with post wt 122.8 4. Hx R nephrectomy for massive R hydro - done to make room for transplant in the future 5. Hx obesity sp gastric lapband 6. + trop due to demand ischemia 7. Anemia - hgb 10.6 - follow- no ESA - on weekly IV Fe per pt - last tsat 46% early Dec 8. 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  Kelly Splinter MD [facility] pager [phone]   11/30/2016, 2:01 PM    Subjective:   No prob with HD  Objective Vitals:   11/30/16 0327 11/30/16 0800 11/30/16 0814 11/30/16 0900  BP: (!) 96/58  101/65 101/65  Pulse:  64 (!) 58 75  Resp: 20 13 14    Temp: 98.7 F (37.1 C)  98.4 F (36.9 C)   TempSrc: Oral  Oral   SpO2: 100%  97%   Weight: 122.2 kg (269 lb 6.4 oz)     Height:       Physical Exam General:healthy appearing engaged male NAD  Heart: RRR Lungs: no rales Abdomen: soft NT Extremities: no edema Dialysis Access:  AVF   Additional Objective Labs: Basic Metabolic Panel:  Recent Labs Lab 11/27/16 2159 11/28/16 0346 11/29/16 0249  NA 135 137 138  K 3.1* 3.3* 3.4*  CL 98* 100* 102  CO2 23 27 25   GLUCOSE 106* 123* 98  BUN 22* 23* 37*  CREATININE 5.43* 5.83* 7.05*  CALCIUM 9.2 8.9 9.3   Liver Function  Tests:  Recent Labs Lab 11/27/16 2159  AST 26  ALT 26  ALKPHOS 56  BILITOT 1.0  PROT 7.3  ALBUMIN 3.9   CBC:  Recent Labs Lab 11/27/16 2159 11/28/16 1909 11/29/16 0249 11/30/16 0234  WBC 9.6 7.1 7.6 7.7  NEUTROABS 5.6  --   --   --   HGB 11.8* 10.4* 10.4* 10.6*  HCT 34.4* 30.3* 30.4* 31.8*  MCV 96.4 94.1 95.3 95.2  PLT 151 165 167 161    Cardiac Enzymes:  Recent Labs Lab 11/28/16 0346 11/28/16 1010 11/28/16 1600  TROPONINI 0.06* 0.11* 0.11*   CBG: No results for input(s): GLUCAP in the last 168 hours. Iron Studies: No results for input(s): IRON, TIBC, TRANSFERRIN, FERRITIN in the last 72 hours. Lab Results  Component Value Date   INR 1.15 11/30/2016   INR 1.11 11/29/2016   INR 1.18 11/28/2016   Studies/Results: No results found. Medications: . heparin 1,950 Units/hr (11/30/16 0800)   . aspirin EC  81 mg Oral QPM  . atorvastatin  10 mg Oral QPM  . metoprolol tartrate  25 mg Oral BID  . multivitamin  1 tablet Oral QHS  .  pantoprazole  40 mg Oral Daily  . senna  1 tablet Oral Daily  . warfarin  7.5 mg Oral ONCE-1800  . Warfarin - Pharmacist Dosing Inpatient   Does not apply [phone]

## 2016-11-30 NOTE — Progress Notes (Signed)
New Admission Note: transfer 4N  Arrival Method: WC Mental Orientation: a/o x4 Telemetry:placed Assessment: Completed Skin:clean dry intact IV:RFA Heparin 19.5 Pain: none Tubes:none Safety Measures: Safety Fall Prevention Plan has been given, discussed and signed Admission: Completed Unit Orientation: Patient has been orientated to the room, unit and staff.  Family:wife at bedside  Orders have been reviewed and implemented. Will continue to monitor the patient. Call light has been placed within reach and bed alarm has been activated.   Retta Mac BSN, RNn

## 2016-12-01 LAB — RENAL FUNCTION PANEL
Albumin: 3.5 g/dL (ref 3.5–5.0)
Anion gap: 10 (ref 5–15)
BUN: 49 mg/dL — ABNORMAL HIGH (ref 6–20)
CO2: 26 mmol/L (ref 22–32)
Calcium: 9.5 mg/dL (ref 8.9–10.3)
Chloride: 102 mmol/L (ref 101–111)
Creatinine, Ser: 8.57 mg/dL — ABNORMAL HIGH (ref 0.61–1.24)
GFR calc Af Amer: 7 mL/min — ABNORMAL LOW (ref >60)
GFR calc non Af Amer: 6 mL/min — ABNORMAL LOW (ref >60)
Glucose, Bld: 89 mg/dL (ref 65–99)
Phosphorus: 3.6 mg/dL (ref 2.5–4.6)
Potassium: 3.7 mmol/L (ref 3.5–5.1)
Sodium: 138 mmol/L (ref 135–145)

## 2016-12-01 LAB — CBC
HCT: 33.5 % — ABNORMAL LOW (ref 39.0–52.0)
HEMOGLOBIN: 11.5 g/dL — AB (ref 13.0–17.0)
MCH: 32.7 pg (ref 26.0–34.0)
MCHC: 34.3 g/dL (ref 30.0–36.0)
MCV: 95.2 fL (ref 78.0–100.0)
Platelets: 171 10*3/uL (ref 150–400)
RBC: 3.52 MIL/uL — AB (ref 4.22–5.81)
RDW: 13.2 % (ref 11.5–15.5)
WBC: 8.2 10*3/uL (ref 4.0–10.5)

## 2016-12-01 LAB — PROTIME-INR
INR: 1.4
Prothrombin Time: 17.3 seconds — ABNORMAL HIGH (ref 11.4–15.2)

## 2016-12-01 LAB — HEPARIN LEVEL (UNFRACTIONATED): Heparin Unfractionated: 0.44 IU/mL (ref 0.30–0.70)

## 2016-12-01 MED ORDER — HEPARIN SODIUM (PORCINE) 1000 UNIT/ML DIALYSIS: 1000.0000 [IU] | INTRAMUSCULAR | Status: DC | PRN

## 2016-12-01 MED ORDER — WARFARIN SODIUM 7.5 MG PO TABS
7.5000 mg | ORAL_TABLET | Freq: Once | ORAL | Status: AC
  Administered 2016-12-01: 7.5 mg via ORAL
  Filled 2016-12-01: qty 1

## 2016-12-01 MED ORDER — SODIUM CHLORIDE 0.9 % IV SOLN: 100.0000 mL | INTRAVENOUS | Status: DC | PRN

## 2016-12-01 MED ORDER — LIDOCAINE HCL (PF) 1 % IJ SOLN: 5.0000 mL | INTRAMUSCULAR | Status: DC | PRN

## 2016-12-01 MED ORDER — LIDOCAINE-PRILOCAINE 2.5-2.5 % EX CREA: TOPICAL_CREAM | Freq: Once | CUTANEOUS | Status: DC

## 2016-12-01 MED ORDER — LIDOCAINE-PRILOCAINE 2.5-2.5 % EX CREA: 1.0000 "application " | TOPICAL_CREAM | CUTANEOUS | Status: DC | PRN

## 2016-12-01 MED ORDER — HEPARIN SODIUM (PORCINE) 1000 UNIT/ML DIALYSIS: 20.0000 [IU]/kg | INTRAMUSCULAR | Status: DC | PRN

## 2016-12-01 MED ORDER — PENTAFLUOROPROP-TETRAFLUOROETH EX AERO: 1.0000 "application " | INHALATION_SPRAY | CUTANEOUS | Status: DC | PRN

## 2016-12-01 MED ORDER — ALTEPLASE 2 MG IJ SOLR: 2.0000 mg | Freq: Once | INTRAMUSCULAR | Status: DC | PRN

## 2016-12-01 NOTE — Progress Notes (Signed)
Patient arrived to unit per chair.  Reviewed treatment plan and this RN agrees.  Report received from bedside RN, Otila Kluver.  Consent verified.  Patient A & O X 4. Lung sounds clear to ausculation in all fields. No edema. Cardiac: NSR.  Prepped RUAVF with alcohol and cannulated with two 15 gauge needles.  Pulsation of blood noted.  Flushed access well with saline per protocol.  Connected and secured lines and initiated tx at 1131.  UF goal of 1100 mL and net fluid removal of 600 mL.  Will continue to monitor.

## 2016-12-01 NOTE — Procedures (Signed)
I have personally attended this patient's dialysis session.  4K bath R AVF 400 No issues, stable rhythm and BP  Jamal Maes, MD [place] Pager 12/01/2016, 1:01 PM

## 2016-12-01 NOTE — Progress Notes (Signed)
[facility] Progress Note   Subjective: "I'm doing well!" Very pleasant, up in chair. Without C/O SOB/Palpitations. Wife at bedside.   Objective Vitals:   11/30/16 1604 11/30/16 1705 11/30/16 2056 12/01/16 0556  BP: 113/76  140/68 131/70  Pulse:   67 72  Resp:  13 16 16   Temp: 98.2 F (36.8 C)  98.4 F (36.9 C) 97.5 F (36.4 C)  TempSrc: Oral  Oral Oral  SpO2:   100% 100%  Weight:   120 kg (264 lb 8.8 oz)   Height:       Physical Exam General: Very pleasant, WN,WD, NAD Heart: S1,S2, RRR No M/G/R Lungs: CTAB Abdomen: Active BS, non-tender Extremities: No LE edema Dialysis Access: RUA AVF + bruit   Additional Objective  Recent Labs Lab 11/28/16 0346 11/29/16 0249 12/01/16 1135  NA 137 138 138  K 3.3* 3.4* 3.7  CL 100* 102 102  CO2 27 25 26   GLUCOSE 123* 98 89  BUN 23* 37* 49*  CREATININE 5.83* 7.05* 8.57*  CALCIUM 8.9 9.3 9.5  PHOS  --   --  3.6   Liver Function Tests:  Recent Labs Lab 11/27/16 2159 12/01/16 1135  AST 26  --   ALT 26  --   ALKPHOS 56  --   BILITOT 1.0  --   PROT 7.3  --   ALBUMIN 3.9 3.5   CBC:  Recent Labs Lab 11/27/16 2159 11/28/16 1909 11/29/16 0249 11/30/16 0234 12/01/16 0730  WBC 9.6 7.1 7.6 7.7 8.2  NEUTROABS 5.6  --   --   --   --   HGB 11.8* 10.4* 10.4* 10.6* 11.5*  HCT 34.4* 30.3* 30.4* 31.8* 33.5*  MCV 96.4 94.1 95.3 95.2 95.2  PLT 151 165 167 161 171   Lab Results  Component Value Date   INR 1.40 12/01/2016   INR 1.15 11/30/2016   INR 1.11 11/29/2016     Recent Labs Lab 11/28/16 0346 11/28/16 1010 11/28/16 1600  TROPONINI 0.06* 0.11* 0.11*   Medications: . heparin 1,950 Units/hr (11/30/16 2055)   . aspirin EC  81 mg Oral QPM  . atorvastatin  10 mg Oral QPM  . lidocaine-prilocaine   Topical Once  . metoprolol tartrate  25 mg Oral BID  . multivitamin  1 tablet Oral QHS  . pantoprazole  40 mg Oral Daily  . senna  1 tablet Oral Daily  . warfarin  7.5 mg Oral ONCE-1800  .  Warfarin - Pharmacist Dosing Inpatient   Does not apply q1800   Dialysis Orders: Home HD NxtStage, 5 days per week, 3.5 hr each, dry wt 123kg, 5000 hep, 15ga sharp needles   Assessment/Plan: 1. Afib with RVR - sp DCCV in ED 11/27/16. In NSR now,bridging heparin to coumadin, awaiting INR to be therapeutic prior to DC. Metoprolol increased per cards.  2. ESRD on home HD, started in June this year. Followed by Dr. Lorrene Reid. Plan HD today.  Wife may place needles - do 4 hr 3 x a week while here. Renal profile with HD. Last K+ 3.4. 4.0 K bath  3. HTN/vol: For HD today. BP controlled. UFG ordered to OP EDW 122-123 kg however pt last wt 120 kg is below OP EDW. No LE edema, appears euvolemic.  4. Hx R nephrectomy for massive R hydro - done to make room for transplant in the future 5. Hx obesity sp gastric lap-band 6. Elevated troponin: Demand ischemia possibly related to Afib/RBR. Cardiology following.  7. 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 Brown NP-C 12/01/2016, 8:50 AM  [employer] [phone]  I have seen and examined this patient and agree with plan and assessment in the above note with renal recommendations/intervention highlighted. INR 1.4 not yet therapeutic. Maintaining SR. Getting HD today.  Kemani Demarais B,MD 12/01/2016 12:56 PM

## 2016-12-01 NOTE — Progress Notes (Signed)
[place] for Heparin and Coumadin  Indication: atrial fibrillation  No Known Allergies  Patient Measurements: Height: 5\' 10"  (177.8 cm) Weight: 264 lb 8.8 oz (120 kg) IBW/kg (Calculated) : 73  Ht: 70 in  Wt: 126 kg  IBW: 73 kg Heparin Dosing Weight: 101 kg  Vital Signs: Temp: 97.5 F (36.4 C) (01/01 0556) Temp Source: Oral (01/01 0556) BP: 131/70 (01/01 0556) Pulse Rate: 72 (01/01 0556)  Labs:  Recent Labs  11/28/16 1010 11/28/16 1600  11/29/16 0249 11/29/16 1804 11/30/16 0234 12/01/16 0730  HGB  --   --   < > 10.4*  --  10.6* 11.5*  HCT  --   --   < > 30.4*  --  31.8* 33.5*  PLT  --   --   < > 167  --  161 171  LABPROT  --   --   --  14.3  --  14.7 17.3*  INR  --   --   --  1.11  --  1.15 1.40  HEPARINUNFRC 0.25*  --   < > 0.50 0.52 0.39 0.44  CREATININE  --   --   --  7.05*  --   --   --   TROPONINI 0.11* 0.11*  --   --   --   --   --   < > = values in this interval not displayed.  Estimated Creatinine Clearance: 15 mL/min (by C-G formula based on SCr of 7.05 mg/dL (H)).   Medical History: Past Medical History:  Diagnosis Date  . Arthritis   . Cataract    bilateral- right removed, left present  . Chronic diastolic CHF (congestive heart failure) ([place])   . Complication of anesthesia    problem with bladder not waking up after surgery-for right nephrectomy  . Dysrhythmia   . Enlarged kidney    right  . Gout   . Hyperlipidemia   . Hypertension   . PAF (paroxysmal atrial fibrillation) (HCC)    happens about every 6 years-sees nishan  . Renal insufficiency    KIDNEY DISEASE - FOLLOWED BY DR. Lorrene Reid NOTES ON CHART   . heparin 1,950 Units/hr (11/30/16 2055)     Assessment: 56 y.o. M presents with new onset afib with RVR. S/p DCCV in ED 12/29. Now to begin heparin bridge to coumadin. Pt with ESRD - waiting on kidney transplant. Baseline INR 1.01 -CBC stable  -INR increasing to 1.40 -HL 0.44  Goal of Therapy:   Heparin level 0.3-0.7 units/ml; INR 2-3 Monitor platelets by anticoagulation protocol: Yes   Plan:  Continue heparin 1950 units/hr Warfarin 7.5mg  po x 1 tonight Daily heparin level, INR, and CBC Monitor for S&S of bleed  Myer Peer Grayland Ormond), PharmD  PGY1 Pharmacy Resident Pager: [phone] 12/01/2016 9:44 AM

## 2016-12-01 NOTE — Progress Notes (Signed)
[facility] PROGRESS NOTE  LAYMAN GULLY ZOX:[id number] DOB: 1961-06-30 DOA: 11/27/2016  PCP: Pcp Not In System  Brief History/Interval Summary: [name] [name] a 56 y.o.malewith medical history significant of chronic diastolic heart failure, hypertension, hyperlipidemia, paroxysmal atrial fibrillation, and ESRD on home hemodialysis, who presented with palpitation and was found to be in A. fib with RVR heart rate in the 180s, did not respond to diltiazem, status post DCCV, successfully converted to sinus rhythm, on heparin bridge and warfarin target INR 2-3.   Reason for Visit: Paroxysmal atrial fibrillation  Consultants: Cardiology. Nephrology.  Procedures:  DC cardioversion  Transthoracic echocardiogram Study Conclusions - Left ventricle: The cavity size was mildly dilated. Wall   thickness was normal. Systolic function was normal. The estimated   ejection fraction was in the range of 60% to 65%. Wall motion was   normal; there were no regional wall motion abnormalities. Left   ventricular diastolic function parameters were normal. - Aortic valve: There was no stenosis. - Aorta: Mildly dilated aortic root and ascending aorta. Aortic   root dimension: 39 mm (ED). Ascending aortic diameter: 37 mm (S). - Mitral valve: There was trivial regurgitation. - Left atrium: The atrium was mildly dilated. - Right ventricle: The cavity size was normal. Systolic function   was normal. - Pulmonary arteries: PA peak pressure: 33 mm Hg (S). - Inferior vena cava: The vessel was normal in size. The   respirophasic diameter changes were in the normal range (>= 50%),   consistent with normal central venous pressure. Impressions: - Mildly dilated LV with EF 60-65%. Normal diastolic function.   Normal RV size and systolic function. No significant valvular   abnormalities.  Hemodialysis  Antibiotics: None  Subjective/Interval History: Patient feels well. Denies any  complaints. No chest pain, shortness of breath or palpitations.   ROS: Denies headaches.  Objective:  Vital Signs  Vitals:   12/01/16 1000 12/01/16 1115 12/01/16 1131 12/01/16 1200  BP: (!) 107/57 128/75 120/72 (!) 99/57  Pulse: 68 65 62 (!) 54  Resp: 18 15    Temp: 97.8 F (36.6 C) 97.7 F (36.5 C)    TempSrc: Oral     SpO2: 95%     Weight:  123.1 kg (271 lb 6.2 oz)    Height:        Intake/Output Summary (Last 24 hours) at 12/01/16 1230 Last data filed at 12/01/16 1002  Gross per 24 hour  Intake           1051.5 ml  Output                0 ml  Net           1051.5 ml   Filed Weights   11/30/16 0327 11/30/16 2056 12/01/16 1115  Weight: 122.2 kg (269 lb 6.4 oz) 120 kg (264 lb 8.8 oz) 123.1 kg (271 lb 6.2 oz)    General appearance: alert, cooperative, appears stated age and no distress Resp: clear to auscultation bilaterally Cardio: regular rate and rhythm, S1, S2 normal, no murmur, click, rub or gallop GI: soft, non-tender; bowel sounds normal; hard mass identified in the upper abdomen consistent with patient's history of lap band.  no organomegaly Neurologic: Awake and alert. Oriented 3. No focal neurological deficits.  Lab Results:  Data Reviewed: I have personally reviewed following labs and imaging studies  CBC:  Recent Labs Lab 11/27/16 2159 11/28/16 1909 11/29/16 0249 11/30/16 0234 12/01/16 0730  WBC 9.6 7.1 7.6 7.7  8.2  NEUTROABS 5.6  --   --   --   --   HGB 11.8* 10.4* 10.4* 10.6* 11.5*  HCT 34.4* 30.3* 30.4* 31.8* 33.5*  MCV 96.4 94.1 95.3 95.2 95.2  PLT 151 165 167 161 440    Basic Metabolic Panel:  Recent Labs Lab 11/27/16 2159 11/28/16 0346 11/29/16 0249 12/01/16 1135  NA 135 137 138 138  K 3.1* 3.3* 3.4* 3.7  CL 98* 100* 102 102  CO2 23 27 25 26   GLUCOSE 106* 123* 98 89  BUN 22* 23* 37* 49*  CREATININE 5.43* 5.83* 7.05* 8.57*  CALCIUM 9.2 8.9 9.3 9.5  MG 1.9  --   --   --   PHOS  --   --   --  3.6    GFR: Estimated  Creatinine Clearance: 12.5 mL/min (by C-G formula based on SCr of 8.57 mg/dL (H)).  Liver Function Tests:  Recent Labs Lab 11/27/16 2159 12/01/16 1135  AST 26  --   ALT 26  --   ALKPHOS 56  --   BILITOT 1.0  --   PROT 7.3  --   ALBUMIN 3.9 3.5    Coagulation Profile:  Recent Labs Lab 11/27/16 2159 11/28/16 0346 11/29/16 0249 11/30/16 0234 12/01/16 0730  INR 1.01 1.18 1.11 1.15 1.40    Cardiac Enzymes:  Recent Labs Lab 11/28/16 0346 11/28/16 1010 11/28/16 1600  TROPONINI 0.06* 0.11* 0.11*     Recent Results (from the past 240 hour(s))  MRSA PCR Screening     Status: None   Collection Time: 11/28/16  4:28 AM  Result Value Ref Range Status   MRSA by PCR NEGATIVE NEGATIVE Final    Comment:        The GeneXpert MRSA Assay (FDA approved for NASAL specimens only), is one component of a comprehensive MRSA colonization surveillance program. It is not intended to diagnose MRSA infection nor to guide or monitor treatment for MRSA infections.       Radiology Studies: No results found.   Medications:  Scheduled: . aspirin EC  81 mg Oral QPM  . atorvastatin  10 mg Oral QPM  . lidocaine-prilocaine   Topical Once  . metoprolol tartrate  25 mg Oral BID  . multivitamin  1 tablet Oral QHS  . pantoprazole  40 mg Oral Daily  . senna  1 tablet Oral Daily  . warfarin  7.5 mg Oral ONCE-1800  . Warfarin - Pharmacist Dosing Inpatient   Does not apply q1800   Continuous: . heparin 1,950 Units/hr (11/30/16 2055)   HKV:QQVZDG chloride, sodium chloride, acetaminophen, ALPRAZolam, alteplase, heparin, heparin, lidocaine (PF), lidocaine-prilocaine, pentafluoroprop-tetrafluoroeth, zolpidem  Assessment/Plan:  Principal Problem:   Atrial fibrillation with RVR (HCC) Active Problems:   Hypertension   ESRD on dialysis ([place])   Chronic diastolic CHF (congestive heart failure) (HCC)   Hypokalemia   HLD (hyperlipidemia)   Atrial fibrillation (HCC)    Paroxysmal  atrial fibrillation Patient presented with a rapid ventricular response. Did not improve with diltiazem infusion. Patient was cardioverted. Currently on heparin infusion. Cardiology is following. Patient is on a beta blocker, which will be continued. TSH is normal. Echocardiogram as above. CHADS2Vasc 2 (history of chronic diastolic heart failure and HTN), heparin bridge to warfarin with target INR 2-3. Patient will need anticoagulation for about 4 weeks per cardiology. Await therapeutic INR.  Elevated troponins Secondary to demand ischemia from A. Fib. Cardiology is following. No plans for ischemic workup in the hospital.  ESRD on  home HD Nephrology consulted. Patient was dialyzed on Saturday. Next dialysis today.   Essential Hypertension Continue home medications. Blood pressure is reasonably well controlled.  Hyperlipidemia Continue with statin  Hx obesity sp gastric lapband Stable  DVT Prophylaxis: On IV heparin and oral warfarin    Code Status: Full code  Family Communication: Discussed with the patient  Disposition Plan: Mobilize as tolerated. Await therapeutic INR.    LOS: 3 days   [facility] Pager [phone] 12/01/2016, 12:30 PM  If 7PM-7AM, please contact night-coverage at [email or web address], password [facility]

## 2016-12-01 NOTE — Progress Notes (Signed)
Dialysis treatment completed.  1100 mL ultrafiltrated and net fluid removal 600 mL.    Patient status unchanged. Lung sounds clear to ausculation in all fields. No edema. Cardiac: NSR.  Disconnected lines and removed needles.  Pressure held for 10 minutes and band aid/gauze dressing applied.  Report given to bedside RN, Otila Kluver.

## 2016-12-01 NOTE — Progress Notes (Signed)
Patient Name: [name] [name] Date of Encounter: 12/01/2016  Primary Cardiologist: Dr [facility] Problem List     Principal Problem:   Atrial fibrillation with RVR [facility]) Active Problems:   Hypertension   ESRD on dialysis ([place])   Chronic diastolic CHF (congestive heart failure) (HCC)   Hypokalemia   HLD (hyperlipidemia)   Atrial fibrillation ([place])     Subjective   [name] feels great again today. No chest pain, shortness of breath, palpitations or dizziness. Feels well.   Inpatient Medications    Scheduled Meds: . aspirin EC  81 mg Oral QPM  . atorvastatin  10 mg Oral QPM  . metoprolol tartrate  25 mg Oral BID  . multivitamin  1 tablet Oral QHS  . pantoprazole  40 mg Oral Daily  . senna  1 tablet Oral Daily  . Warfarin - Pharmacist Dosing Inpatient   Does not apply q1800   Continuous Infusions: . heparin 1,950 Units/hr (11/30/16 2055)   PRN Meds: acetaminophen, ALPRAZolam, zolpidem   Vital Signs    Vitals:   11/30/16 1604 11/30/16 1705 11/30/16 2056 12/01/16 0556  BP: 113/76  140/68 131/70  Pulse:   67 72  Resp:  13 16 16   Temp: 98.2 F (36.8 C)  98.4 F (36.9 C) 97.5 F (36.4 C)  TempSrc: Oral  Oral Oral  SpO2:   100% 100%  Weight:   264 lb 8.8 oz (120 kg)   Height:        Intake/Output Summary (Last 24 hours) at 12/01/16 0856 Last data filed at 12/01/16 0837  Gross per 24 hour  Intake           1129.5 ml  Output                0 ml  Net           1129.5 ml   Filed Weights   11/29/16 1614 11/30/16 0327 11/30/16 2056  Weight: 270 lb 11.6 oz (122.8 kg) 269 lb 6.4 oz (122.2 kg) 264 lb 8.8 oz (120 kg)    Physical Exam   GEN: Well nourished, well developed, caucasian male in no acute distress.  HEENT: Grossly normal.  Neck: Supple, no JVD, carotid bruits, or masses. Cardiac: RRR, no murmurs, rubs, or gallops. No clubbing, cyanosis, edema.  Radials/DP/PT 2+ and equal bilaterally.  Respiratory:  Respirations regular and unlabored,  clear to auscultation bilaterally. GI: Soft, nontender, nondistended, BS + x 4. MS: no deformity or atrophy. Skin: warm and dry, no rash. AV fistula left arm Neuro:  Strength and sensation are intact. Psych: AAOx3.  Normal affect.  Labs    CBC  Recent Labs  11/30/16 0234 12/01/16 0730  WBC 7.7 8.2  HGB 10.6* 11.5*  HCT 31.8* 33.5*  MCV 95.2 95.2  PLT 161 751   Basic Metabolic Panel  Recent Labs  11/29/16 0249  NA 138  K 3.4*  CL 102  CO2 25  GLUCOSE 98  BUN 37*  CREATININE 7.05*  CALCIUM 9.3   Liver Function Tests No results for input(s): AST, ALT, ALKPHOS, BILITOT, PROT, ALBUMIN in the last 72 hours. No results for input(s): LIPASE, AMYLASE in the last 72 hours. Cardiac Enzymes  Recent Labs  11/28/16 1010 11/28/16 1600  TROPONINI 0.11* 0.11*   BNP Invalid input(s): POCBNP D-Dimer No results for input(s): DDIMER in the last 72 hours. Hemoglobin A1C No results for input(s): HGBA1C in the last 72 hours. Fasting Lipid Panel No results  for input(s): CHOL, HDL, LDLCALC, TRIG, CHOLHDL, LDLDIRECT in the last 72 hours. Thyroid Function Tests No results for input(s): TSH, T4TOTAL, T3FREE, THYROIDAB in the last 72 hours.  Invalid input(s): FREET3  Telemetry    NSR with rates in the 60's, no ectopy - Personally Reviewed  ECG   11/27/16- atrial fibrillation with RVR 169 bpm 11/28/16 0044 Post DCCV: Sinus rhythm at 91 bpm minimal ST depression diffusely - Personally Reviewed  Radiology    No results found.  Cardiac Studies   Echo  - Left ventricle: The cavity size was mildly dilated. Wall   thickness was normal. Systolic function was normal. The estimated ejection fraction was in the range of 60% to 65%. Wall motion was normal; there were no regional wall motion abnormalities. Left ventricular diastolic function parameters were normal. - Aortic valve: There was no stenosis. - Aorta: Mildly dilated aortic root and ascending aorta. Aortic   root  dimension: 39 mm (ED). Ascending aortic diameter: 37 mm (S). - Mitral valve: There was trivial regurgitation. - Left atrium: The atrium was mildly dilated. - Right ventricle: The cavity size was normal. Systolic function   was normal. - Pulmonary arteries: PA peak pressure: 33 mm Hg (S). - Inferior vena cava: The vessel was normal in size. The   respirophasic diameter changes were in the normal range (>= 50%),   consistent with normal central venous pressure.  Impressions:  - Mildly dilated LV with EF 60-65%. Normal diastolic function.   Normal RV size and systolic function. No significant valvular   abnormalities.  Patient Profile     56 yo male with past medical history significant for chronic diastolic heart failure (grade 2 DD), hypertension, hyperlipidemia, PAF, obesity with laparoscopic gastric banding and ESRD on home hemodialysis since 07/2016 and on renal transplant list at [facility] present to the ED on 11/27/16 for evaluation of new onset feeling of uneasiness in his chest and racing heart beat. He was found to be in atrial fibrillation with RVR at a rate of 169 bpm. Urgent cardioversion was completed with conversion to sinus rhythm. He has been started on heparin IV with bridging to Coumadin.  Assessment & Plan    1. Atrial fibrillation with rapid ventricular response -Symptomatic on arrival with chest uneasiness and feeling of rapid heartbeat. No chest pain, shortness of breath, dizziness or orthopnea. -Has had several episodes of PAF in the past that spontaneously terminated without treatment, no previous anticoagulation -Diltiazem 5 mg and 10 mg given with minimal effect. Urgent cardioversion with 200 joules X1 shock to sinus rhythm. Continues in sinus rhythm in the 60's -Metoprolol 25 mg increase from once every evening to bid -CHA2DS2-VASc score 1 (HTN) Pt needs 4 weeks of anticoagulation post DCCV and possible atrial stunning. With ESRD on dialysis he is not a  candidate for NOAC. He is on IV heparin with bridge to coumadin which has been initiated. Monitor INR with goal 2-3. -Once INR therapeutic can stop aspirin -Echo reassuring.  -Pt thinks he had a cardiac cath more than 10 years ago at [facility] and recalls that it was normal. Unable to find report.  2. Elevated troponins -Troponins 0.06, 0.11 due to demand ischemia in setting of afib with RVR and S/P DCCV  3. Hypertension -Well controlled on metoprolol  4. Hyperlipidemia -continue atorvastatin  5. ESRD -On hemodialysis and awaiting renal transplant -Nephrology following with plans for HD on Sat and Monday  Awaiting INR of 2 prior to DC.  Signed, Candee Furbish, MD  12/01/2016, 8:56 AM

## 2016-12-02 LAB — CBC
HCT: 34.7 % — ABNORMAL LOW (ref 39.0–52.0)
Hemoglobin: 11.6 g/dL — ABNORMAL LOW (ref 13.0–17.0)
MCH: 32.5 pg (ref 26.0–34.0)
MCHC: 33.4 g/dL (ref 30.0–36.0)
MCV: 97.2 fL (ref 78.0–100.0)
PLATELETS: 153 10*3/uL (ref 150–400)
RBC: 3.57 MIL/uL — ABNORMAL LOW (ref 4.22–5.81)
RDW: 13.6 % (ref 11.5–15.5)
WBC: 7.9 10*3/uL (ref 4.0–10.5)

## 2016-12-02 LAB — PROTIME-INR
INR: 1.5
Prothrombin Time: 18.2 seconds — ABNORMAL HIGH (ref 11.4–15.2)

## 2016-12-02 LAB — HEPARIN LEVEL (UNFRACTIONATED): HEPARIN UNFRACTIONATED: 0.53 [IU]/mL (ref 0.30–0.70)

## 2016-12-02 MED ORDER — WARFARIN SODIUM 10 MG PO TABS
10.0000 mg | ORAL_TABLET | Freq: Once | ORAL | Status: AC
Start: 2016-12-02 — End: 2016-12-02
  Administered 2016-12-02: 10 mg via ORAL
  Filled 2016-12-02: qty 1

## 2016-12-02 NOTE — Progress Notes (Signed)
Patient Name: [name] [name] Date of Encounter: 12/02/2016  Primary Cardiologist: Dr [facility] ([facility] Problem List     Principal Problem:   Atrial fibrillation with RVR [facility]) Active Problems:   Hypertension   ESRD on dialysis ([place])   Chronic diastolic CHF (congestive heart failure) (HCC)   Hypokalemia   HLD (hyperlipidemia)   Atrial fibrillation ([place])     Subjective   Mr. Hail feels great again today. No chest pain, shortness of breath, palpitations or dizziness. Feels well.   Inpatient Medications    Scheduled Meds: . aspirin EC  81 mg Oral QPM  . atorvastatin  10 mg Oral QPM  . lidocaine-prilocaine   Topical Once  . metoprolol tartrate  25 mg Oral BID  . multivitamin  1 tablet Oral QHS  . pantoprazole  40 mg Oral Daily  . senna  1 tablet Oral Daily  . warfarin  10 mg Oral ONCE-1800  . Warfarin - Pharmacist Dosing Inpatient   Does not apply q1800   Continuous Infusions: . heparin 1,950 Units/hr (12/02/16 0059)   PRN Meds: acetaminophen, ALPRAZolam, zolpidem   Vital Signs    Vitals:   12/01/16 1534 12/01/16 1601 12/01/16 2146 12/02/16 0554  BP: 123/70 (!) 122/53 118/71 140/80  Pulse: 67 63 74 76  Resp:  16 18 18   Temp:  98.3 F (36.8 C) 98.4 F (36.9 C) 97.8 F (36.6 C)  TempSrc:  Oral Oral Oral  SpO2:  100% 100% 100%  Weight:      Height:        Intake/Output Summary (Last 24 hours) at 12/02/16 1109 Last data filed at 12/02/16 0600  Gross per 24 hour  Intake              708 ml  Output              601 ml  Net              107 ml   Filed Weights   11/30/16 2056 12/01/16 1115 12/01/16 1531  Weight: 264 lb 8.8 oz (120 kg) 271 lb 6.2 oz (123.1 kg) 270 lb 1 oz (122.5 kg)    Physical Exam   GEN: Well nourished, well developed, caucasian male in no acute distress.  HEENT: Grossly normal.  Neck: Supple, no JVD, carotid bruits, or masses. Cardiac: RRR, no murmurs, rubs, or gallops. No clubbing, cyanosis, edema.  Radials/DP/PT 2+ and equal  bilaterally.  Respiratory:  Respirations regular and unlabored, clear to auscultation bilaterally. GI: Soft, nontender, nondistended, BS + x 4. MS: no deformity or atrophy. Skin: warm and dry, no rash. AV fistula left arm Neuro:  Strength and sensation are intact. Psych: AAOx3.  Normal affect.  Labs    CBC  Recent Labs  12/01/16 0730 12/02/16 0813  WBC 8.2 7.9  HGB 11.5* 11.6*  HCT 33.5* 34.7*  MCV 95.2 97.2  PLT 171 937   Basic Metabolic Panel  Recent Labs  12/01/16 1135  NA 138  K 3.7  CL 102  CO2 26  GLUCOSE 89  BUN 49*  CREATININE 8.57*  CALCIUM 9.5  PHOS 3.6   Liver Function Tests  Recent Labs  12/01/16 1135  ALBUMIN 3.5   No results for input(s): LIPASE, AMYLASE in the last 72 hours. Cardiac Enzymes No results for input(s): CKTOTAL, CKMB, CKMBINDEX, TROPONINI in the last 72 hours. BNP Invalid input(s): POCBNP D-Dimer No results for input(s): DDIMER in the last 72 hours. Hemoglobin A1C No results  for input(s): HGBA1C in the last 72 hours. Fasting Lipid Panel No results for input(s): CHOL, HDL, LDLCALC, TRIG, CHOLHDL, LDLDIRECT in the last 72 hours. Thyroid Function Tests No results for input(s): TSH, T4TOTAL, T3FREE, THYROIDAB in the last 72 hours.  Invalid input(s): FREET3  Telemetry    NSR with rates in the 60's, no ectopy - Personally Reviewed  ECG   11/27/16- atrial fibrillation with RVR 169 bpm 11/28/16 0044 Post DCCV: Sinus rhythm at 91 bpm minimal ST depression diffusely - Personally Reviewed  Radiology    No results found.  Cardiac Studies   Echo  - Left ventricle: The cavity size was mildly dilated. Wall   thickness was normal. Systolic function was normal. The estimated ejection fraction was in the range of 60% to 65%. Wall motion was normal; there were no regional wall motion abnormalities. Left ventricular diastolic function parameters were normal. - Aortic valve: There was no stenosis. - Aorta: Mildly dilated aortic root  and ascending aorta. Aortic   root dimension: 39 mm (ED). Ascending aortic diameter: 37 mm (S). - Mitral valve: There was trivial regurgitation. - Left atrium: The atrium was mildly dilated. - Right ventricle: The cavity size was normal. Systolic function   was normal. - Pulmonary arteries: PA peak pressure: 33 mm Hg (S). - Inferior vena cava: The vessel was normal in size. The   respirophasic diameter changes were in the normal range (>= 50%),   consistent with normal central venous pressure.  Impressions:  - Mildly dilated LV with EF 60-65%. Normal diastolic function.   Normal RV size and systolic function. No significant valvular   abnormalities.  Patient Profile     56 yo male with past medical history significant for chronic diastolic heart failure (grade 2 DD), hypertension, hyperlipidemia, PAF, obesity with laparoscopic gastric banding and ESRD on home hemodialysis since 07/2016 and on renal transplant list at [facility] present to the ED on 11/27/16 for evaluation of new onset feeling of uneasiness in his chest and racing heart beat. He was found to be in atrial fibrillation with RVR at a rate of 169 bpm. Urgent cardioversion was completed with conversion to sinus rhythm. He has been started on heparin IV with bridging to Coumadin.  Assessment & Plan    1. Atrial fibrillation with rapid ventricular response -Symptomatic on arrival with chest uneasiness and feeling of rapid heartbeat. No chest pain, shortness of breath, dizziness or orthopnea. -Has had several episodes of PAF in the past that spontaneously terminated without treatment, no previous anticoagulation -Diltiazem 5 mg and 10 mg given with minimal effect. Urgent cardioversion with 200 joules X1 shock to sinus rhythm. Continues in sinus rhythm in the 60's -Metoprolol 25 mg increase from once every evening to bid -CHA2DS2-VASc score 1 (HTN) Pt needs 4 weeks of anticoagulation post DCCV and possible atrial stunning. With  ESRD on dialysis he is not a candidate for NOAC. He is on IV heparin with bridge to coumadin which has been initiated. Monitor INR with goal 2-3. -Once INR therapeutic can stop aspirin -Echo reassuring.  -Pt thinks he had a cardiac cath more than 10 years ago at [facility] and recalls that it was normal. Unable to find report.  2. Elevated troponins -Troponins 0.06, 0.11 due to demand ischemia in setting of afib with RVR and S/P DCCV  3. Hypertension -Well controlled on metoprolol  4. Hyperlipidemia -continue atorvastatin  5. ESRD -On hemodialysis and awaiting renal transplant -Nephrology following with plans for HD on  Sat and Monday  Awaiting INR of 2 prior to DC. Per pharmacy  Signed, Candee Furbish, MD  12/02/2016, 11:09 AM

## 2016-12-02 NOTE — Progress Notes (Signed)
[facility] PROGRESS NOTE  [name] [name] UXL:[id number] DOB: [birth year] DOA: 11/27/2016  PCP: Pcp Not In System  Brief History/Interval Summary: [name] [name] a 56 y.o.malewith medical history significant of chronic diastolic heart failure, hypertension, hyperlipidemia, paroxysmal atrial fibrillation, and ESRD on home hemodialysis, who presented with palpitation and was found to be in A. fib with RVR heart rate in the 180s, did not respond to diltiazem, status post DCCV, successfully converted to sinus rhythm, on heparin bridge and warfarin target INR 2-3.   Reason for Visit: Paroxysmal atrial fibrillation  Consultants: Cardiology. Nephrology.  Procedures:  DC cardioversion  Transthoracic echocardiogram Study Conclusions - Left ventricle: The cavity size was mildly dilated. Wall   thickness was normal. Systolic function was normal. The estimated   ejection fraction was in the range of 60% to 65%. Wall motion was   normal; there were no regional wall motion abnormalities. Left   ventricular diastolic function parameters were normal. - Aortic valve: There was no stenosis. - Aorta: Mildly dilated aortic root and ascending aorta. Aortic   root dimension: 39 mm (ED). Ascending aortic diameter: 37 mm (S). - Mitral valve: There was trivial regurgitation. - Left atrium: The atrium was mildly dilated. - Right ventricle: The cavity size was normal. Systolic function   was normal. - Pulmonary arteries: PA peak pressure: 33 mm Hg (S). - Inferior vena cava: The vessel was normal in size. The   respirophasic diameter changes were in the normal range (>= 50%),   consistent with normal central venous pressure. Impressions: - Mildly dilated LV with EF 60-65%. Normal diastolic function.   Normal RV size and systolic function. No significant valvular   abnormalities.  Hemodialysis  Antibiotics: None  Subjective/Interval History: Patient denies any complaints. Chest  pain or shortness of breath. No palpitations.   ROS: Denies headaches.  Objective:  Vital Signs  Vitals:   12/01/16 1534 12/01/16 1601 12/01/16 2146 12/02/16 0554  BP: 123/70 (!) 122/53 118/71 140/80  Pulse: 67 63 74 76  Resp:  16 18 18   Temp:  98.3 F (36.8 C) 98.4 F (36.9 C) 97.8 F (36.6 C)  TempSrc:  Oral Oral Oral  SpO2:  100% 100% 100%  Weight:      Height:        Intake/Output Summary (Last 24 hours) at 12/02/16 1006 Last data filed at 12/02/16 0600  Gross per 24 hour  Intake              708 ml  Output              601 ml  Net              107 ml   Filed Weights   11/30/16 2056 12/01/16 1115 12/01/16 1531  Weight: 120 kg (264 lb 8.8 oz) 123.1 kg (271 lb 6.2 oz) 122.5 kg (270 lb 1 oz)    General appearance: alert, cooperative, appears stated age and no distress Resp: clear to auscultation bilaterally Cardio: regular rate and rhythm, S1, S2 normal, no murmur, click, rub or gallop GI: soft, non-tender; bowel sounds normal; hard mass identified in the upper abdomen consistent with patient's history of lap band.  no organomegaly Neurologic: Awake and alert. Oriented 3. No focal neurological deficits.  Lab Results:  Data Reviewed: I have personally reviewed following labs and imaging studies  CBC:  Recent Labs Lab 11/27/16 2159 11/28/16 1909 11/29/16 0249 11/30/16 0234 12/01/16 0730 12/02/16 0813  WBC 9.6 7.1 7.6 7.7  8.2 7.9  NEUTROABS 5.6  --   --   --   --   --   HGB 11.8* 10.4* 10.4* 10.6* 11.5* 11.6*  HCT 34.4* 30.3* 30.4* 31.8* 33.5* 34.7*  MCV 96.4 94.1 95.3 95.2 95.2 97.2  PLT 151 165 167 161 171 536    Basic Metabolic Panel:  Recent Labs Lab 11/27/16 2159 11/28/16 0346 11/29/16 0249 12/01/16 1135  NA 135 137 138 138  K 3.1* 3.3* 3.4* 3.7  CL 98* 100* 102 102  CO2 23 27 25 26   GLUCOSE 106* 123* 98 89  BUN 22* 23* 37* 49*  CREATININE 5.43* 5.83* 7.05* 8.57*  CALCIUM 9.2 8.9 9.3 9.5  MG 1.9  --   --   --   PHOS  --   --   --   3.6    GFR: Estimated Creatinine Clearance: 12.5 mL/min (by C-G formula based on SCr of 8.57 mg/dL (H)).  Liver Function Tests:  Recent Labs Lab 11/27/16 2159 12/01/16 1135  AST 26  --   ALT 26  --   ALKPHOS 56  --   BILITOT 1.0  --   PROT 7.3  --   ALBUMIN 3.9 3.5    Coagulation Profile:  Recent Labs Lab 11/28/16 0346 11/29/16 0249 11/30/16 0234 12/01/16 0730 12/02/16 0813  INR 1.18 1.11 1.15 1.40 1.50    Cardiac Enzymes:  Recent Labs Lab 11/28/16 0346 11/28/16 1010 11/28/16 1600  TROPONINI 0.06* 0.11* 0.11*     Recent Results (from the past 240 hour(s))  MRSA PCR Screening     Status: None   Collection Time: 11/28/16  4:28 AM  Result Value Ref Range Status   MRSA by PCR NEGATIVE NEGATIVE Final    Comment:        The GeneXpert MRSA Assay (FDA approved for NASAL specimens only), is one component of a comprehensive MRSA colonization surveillance program. It is not intended to diagnose MRSA infection nor to guide or monitor treatment for MRSA infections.       Radiology Studies: No results found.   Medications:  Scheduled: . aspirin EC  81 mg Oral QPM  . atorvastatin  10 mg Oral QPM  . lidocaine-prilocaine   Topical Once  . metoprolol tartrate  25 mg Oral BID  . multivitamin  1 tablet Oral QHS  . pantoprazole  40 mg Oral Daily  . senna  1 tablet Oral Daily  . Warfarin - Pharmacist Dosing Inpatient   Does not apply q1800   Continuous: . heparin 1,950 Units/hr (12/02/16 0059)   UYQ:[id number], ALPRAZolam, zolpidem  Assessment/Plan:  Principal Problem:   Atrial fibrillation with RVR (HCC) Active Problems:   Hypertension   ESRD on dialysis ([place])   Chronic diastolic CHF (congestive heart failure) (HCC)   Hypokalemia   HLD (hyperlipidemia)   Atrial fibrillation (H[facility])    Paroxysmal atrial fibrillation Patient presented with rapid ventricular response. Did not improve with diltiazem infusion. Patient was cardioverted. Currently  on heparin infusion. Cardiology is following. Patient is on a beta blocker, which will be continued. TSH is normal. Echocardiogram as above. CHADS2Vasc 2 (history of chronic diastolic heart failure and HTN), heparin bridge to warfarin with target INR 2-3. Patient will need anticoagulation for about 4 weeks per cardiology. Await therapeutic INR.  Elevated troponins Secondary to demand ischemia from A. Fib. Cardiology is following. No plans for ischemic workup in the hospital.  ESRD on home HD Nephrology consulted. Patient was dialyzed on Saturday and then on  Monday.    Essential Hypertension Continue home medications. Blood pressure is reasonably well controlled.  Hyperlipidemia Continue with statin  Hx obesity sp gastric lapband Stable  DVT Prophylaxis: On IV heparin and oral warfarin    Code Status: Full code  Family Communication: Discussed with the patient and his wife. Disposition Plan: Mobilize as tolerated. Await therapeutic INR.    LOS: 4 days   [facility] Pager [phone] 12/02/2016, 10:06 AM  If 7PM-7AM, please contact night-coverage at [email or web address], password [facility]

## 2016-12-02 NOTE — Progress Notes (Signed)
[place] for Heparin and Coumadin  Indication: atrial fibrillation  No Known Allergies  Patient Measurements: Height: 5\' 10"  (177.8 cm) Weight: 270 lb 1 oz (122.5 kg) IBW/kg (Calculated) : 73  Ht: 70 in  Wt: 126 kg  IBW: 73 kg Heparin Dosing Weight: 101 kg  Vital Signs: Temp: 97.8 F (36.6 C) (01/02 0554) Temp Source: Oral (01/02 0554) BP: 140/80 (01/02 0554) Pulse Rate: 76 (01/02 0554)  Labs:  Recent Labs  11/30/16 0234 12/01/16 0730 12/01/16 1135 12/02/16 0813  HGB 10.6* 11.5*  --  11.6*  HCT 31.8* 33.5*  --  34.7*  PLT 161 171  --  153  LABPROT 14.7 17.3*  --  18.2*  INR 1.15 1.40  --  1.50  HEPARINUNFRC 0.39 0.44  --  0.53  CREATININE  --   --  8.57*  --     Estimated Creatinine Clearance: 12.5 mL/min (by C-G formula based on SCr of 8.57 mg/dL (H)).   Medical History: Past Medical History:  Diagnosis Date  . Arthritis   . Cataract    bilateral- right removed, left present  . Chronic diastolic CHF (congestive heart failure) ([place])   . Complication of anesthesia    problem with bladder not waking up after surgery-for right nephrectomy  . Dysrhythmia   . Enlarged kidney    right  . Gout   . Hyperlipidemia   . Hypertension   . PAF (paroxysmal atrial fibrillation) ([facility])    happens about every 6 years-sees nishan  . Renal insufficiency    KIDNEY DISEASE - FOLLOWED BY DR. Lorrene Reid NOTES ON CHART   . heparin 1,950 Units/hr (12/02/16 0059)     Assessment: 56 y.o. M presents with new onset afib with RVR. S/p DCCV in ED 12/29. Now to begin heparin bridge to coumadin. Pt with ESRD - waiting on kidney transplant. Baseline INR 1.01 -CBC stable  -INR increasing to 1.5 -Heparin level therapeutic  Goal of Therapy:  Heparin level 0.3-0.7 units/ml; INR 2-3 Monitor platelets by anticoagulation protocol: Yes   Plan:  Continue heparin 1950 units/hr Warfarin 10 mg po x 1 tonight Daily heparin level, INR, and CBC Monitor  for S&S of bleed  Thank you Anette Guarneri, PharmD [phone] 12/02/2016 10:21 AM

## 2016-12-02 NOTE — Progress Notes (Signed)
[facility] Progress Note   Subjective:  Up in chair, wife at bedside.  Pulled drsg off AVF last PM post HD, says he pulled of scab and had some bleeding.  Pressure held, pressure drsg applied.  No further issues.  Awaiting INR to be therapeutic to be DC'D home.  No C/Os.  Very pleasant.     Objective Vitals:   12/01/16 1534 12/01/16 1601 12/01/16 2146 12/02/16 0554  BP: 123/70 (!) 122/53 118/71 140/80  Pulse: 67 63 74 76  Resp:  16 18 18   Temp:  98.3 F (36.8 C) 98.4 F (36.9 C) 97.8 F (36.6 C)  TempSrc:  Oral Oral Oral  SpO2:  100% 100% 100%  Weight:      Height:       Physical Exam General: WN,WD NAD Heart: S1,S2, RRR Lungs: BBS CTAB A/P Abdomen: soft, NT Extremities: No LE edema Dialysis Access: RUA AVF + bruit   Additional Objective   Recent Labs Lab 11/28/16 0346 11/29/16 0249 12/01/16 1135  NA 137 138 138  K 3.3* 3.4* 3.7  CL 100* 102 102  CO2 27 25 26   GLUCOSE 123* 98 89  BUN 23* 37* 49*  CREATININE 5.83* 7.05* 8.57*  CALCIUM 8.9 9.3 9.5  PHOS  --   --  3.6     Recent Labs Lab 11/27/16 2159 12/01/16 1135  AST 26  --   ALT 26  --   ALKPHOS 56  --   BILITOT 1.0  --   PROT 7.3  --   ALBUMIN 3.9 3.5     Recent Labs Lab 11/27/16 2159 11/28/16 1909 11/29/16 0249 11/30/16 0234 12/01/16 0730 12/02/16 0813  WBC 9.6 7.1 7.6 7.7 8.2 7.9  NEUTROABS 5.6  --   --   --   --   --   HGB 11.8* 10.4* 10.4* 10.6* 11.5* 11.6*  HCT 34.4* 30.3* 30.4* 31.8* 33.5* 34.7*  MCV 96.4 94.1 95.3 95.2 95.2 97.2  PLT 151 165 167 161 171 153   Lab Results  Component Value Date   INR 1.50 12/02/2016   INR 1.40 12/01/2016   INR 1.15 11/30/2016     Recent Labs Lab 11/28/16 0346 11/28/16 1010 11/28/16 1600  TROPONINI 0.06* 0.11* 0.11*   Medications: . heparin 1,950 Units/hr (12/02/16 0059)   . aspirin EC  81 mg Oral QPM  . atorvastatin  10 mg Oral QPM  . lidocaine-prilocaine   Topical Once  . metoprolol tartrate  25 mg Oral  BID  . multivitamin  1 tablet Oral QHS  . pantoprazole  40 mg Oral Daily  . senna  1 tablet Oral Daily  . warfarin  10 mg Oral ONCE-1800  . Warfarin - Pharmacist Dosing Inpatient   Does not apply q1800     Dialysis Orders: Home HD [facility]xtStage, 5 days per week, 3.5 hr each, dry wt 123kg, 5000 hep, 15ga sharp needles   Assessment/Plan: 1. Afib with RVR - sp DCCV in ED 11/27/16. In NSR now,bridging heparin to coumadin, awaiting INR to be therapeutic prior to DC. Metoprolol increased per cards.  2. ESRD on home HD, started in June this year. Followed by Dr. Lorrene Reid. Plan HD today.  Wife mayplace needles - do 4 hr 3 x a week while here. HD 12/01/16.  Next tx tomorrow if still in hospital.  3. HTN/vol: HD 12/01/16 Pre wt 123.1 kg Net UF 600 Post wt 122.5 kg. BP soft toward end of tx. Believe 123 kg appropriate wt.  Allow EDW to drift back up to OP EDW of 123 kg. Run even next tx.  4. Hx R nephrectomy for massive R hydro - done to make room for transplant in the future. On [facility] list at [facility].  5. Hx obesity sp gastric lap-band 6. Elevated troponin: Demand ischemia possibly related to Afib/RBR. Cardiology following.  7. Anemia - hgb 11.5 - follow- no ESA - on weekly IV Fe per pt - last tsat 46% early Dec 8. 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 9. Albumin 3.5 Renal Diet/Renal Vit/Nepro.   Rita H. Brown NP-C 12/02/2016, 9:02 AM  [employer] [phone]  I have seen and examined this patient and agree with plan and assessment in the above note with renal recommendations/intervention highlighted. Will be discharged once INR therapeutic. If discharged tomorrow, he would prefer to do his HD at home which is fine with me.  Advised them that they will likely need to hold sites longer once coumadin therapeutic.   Yamira Papa B,MD 12/02/2016 2:26 PM

## 2016-12-03 LAB — CBC
HCT: 31.3 % — ABNORMAL LOW (ref 39.0–52.0)
HEMOGLOBIN: 10.8 g/dL — AB (ref 13.0–17.0)
MCH: 32.5 pg (ref 26.0–34.0)
MCHC: 34.5 g/dL (ref 30.0–36.0)
MCV: 94.3 fL (ref 78.0–100.0)
Platelets: 150 10*3/uL (ref 150–400)
RBC: 3.32 MIL/uL — ABNORMAL LOW (ref 4.22–5.81)
RDW: 13.3 % (ref 11.5–15.5)
WBC: 7.6 10*3/uL (ref 4.0–10.5)

## 2016-12-03 LAB — PROTIME-INR
INR: 2.02
Prothrombin Time: 23.2 seconds — ABNORMAL HIGH (ref 11.4–15.2)

## 2016-12-03 LAB — HEPARIN LEVEL (UNFRACTIONATED): Heparin Unfractionated: 0.5 IU/mL (ref 0.30–0.70)

## 2016-12-03 MED ORDER — WARFARIN SODIUM 7.5 MG PO TABS: 7.5000 mg | ORAL_TABLET | Freq: Every day | ORAL | Status: DC

## 2016-12-03 MED ORDER — WARFARIN SODIUM 7.5 MG PO TABS: 7.5000 mg | ORAL_TABLET | Freq: Every day | ORAL | 0 refills | Status: DC

## 2016-12-03 MED ORDER — METOPROLOL TARTRATE 25 MG PO TABS: 25.0000 mg | ORAL_TABLET | Freq: Two times a day (BID) | ORAL | 0 refills | Status: DC

## 2016-12-03 MED ORDER — CHLORHEXIDINE GLUCONATE 0.12 % MT SOLN
OROMUCOSAL | Status: AC
  Administered 2016-12-03: 15 mL
  Filled 2016-12-03: qty 15

## 2016-12-03 NOTE — Progress Notes (Signed)
Patient Name: [name] [name] Date of Encounter: 12/03/2016  Primary Cardiologist: Dr [facility] Problem List     Principal Problem:   Atrial fibrillation with RVR [facility]) Active Problems:   Hypertension   ESRD on dialysis ([place])   Chronic diastolic CHF (congestive heart failure) (HCC)   Hypokalemia   HLD (hyperlipidemia)   Atrial fibrillation (HCC)     Subjective   No complaints, ready to go. No CP, no SOB no bleeding.   Inpatient Medications    Scheduled Meds: . aspirin EC  81 mg Oral QPM  . atorvastatin  10 mg Oral QPM  . lidocaine-prilocaine   Topical Once  . metoprolol tartrate  25 mg Oral BID  . multivitamin  1 tablet Oral QHS  . pantoprazole  40 mg Oral Daily  . senna  1 tablet Oral Daily  . Warfarin - Pharmacist Dosing Inpatient   Does not apply q1800   Continuous Infusions: . heparin 1,950 Units/hr (12/02/16 1607)   PRN Meds: acetaminophen, ALPRAZolam, zolpidem   Vital Signs    Vitals:   12/02/16 1100 12/02/16 1700 12/02/16 2115 12/03/16 0537  BP: 121/68 124/70 136/71 111/61  Pulse: 71 65 68 (!) 56  Resp: 18 20 18 18   Temp: 97.4 F (36.3 C) 98.3 F (36.8 C) 97.9 F (36.6 C) 97.5 F (36.4 C)  TempSrc: Oral Oral Oral Oral  SpO2: 97% 100% 100% 100%  Weight:      Height:        Intake/Output Summary (Last 24 hours) at 12/03/16 8295 Last data filed at 12/03/16 0600  Gross per 24 hour  Intake              360 ml  Output                0 ml  Net              360 ml   Filed Weights   11/30/16 2056 12/01/16 1115 12/01/16 1531  Weight: 264 lb 8.8 oz (120 kg) 271 lb 6.2 oz (123.1 kg) 270 lb 1 oz (122.5 kg)    Physical Exam   GEN: Well nourished, well developed, caucasian male in no acute distress.  HEENT: Grossly normal.  Neck: Supple, no JVD, carotid bruits, or masses. Cardiac: RRR, no murmurs, rubs, or gallops. No clubbing, cyanosis, edema.  Radials/DP/PT 2+ and equal bilaterally.  Respiratory:  Respirations regular and unlabored,  clear to auscultation bilaterally. GI: Soft, nontender, nondistended, BS + x 4. MS: no deformity or atrophy. Skin: warm and dry, no rash. AV fistula left arm Neuro:  Strength and sensation are intact. Psych: AAOx3.  Normal affect.  Labs    CBC  Recent Labs  12/02/16 0813 12/03/16 0602  WBC 7.9 7.6  HGB 11.6* 10.8*  HCT 34.7* 31.3*  MCV 97.2 94.3  PLT 153 621   Basic Metabolic Panel  Recent Labs  12/01/16 1135  NA 138  K 3.7  CL 102  CO2 26  GLUCOSE 89  BUN 49*  CREATININE 8.57*  CALCIUM 9.5  PHOS 3.6     Telemetry    NSR with rates in the 60's, no ectopy - Personally Reviewed  ECG   11/27/16- atrial fibrillation with RVR 169 bpm 11/28/16 0044 Post DCCV: Sinus rhythm at 91 bpm minimal ST depression diffusely - Personally Reviewed  Radiology    No results found.  Cardiac Studies   Echo  - Left ventricle: The cavity size was mildly dilated.  Wall   thickness was normal. Systolic function was normal. The estimated ejection fraction was in the range of 60% to 65%. Wall motion was normal; there were no regional wall motion abnormalities. Left ventricular diastolic function parameters were normal. - Aortic valve: There was no stenosis. - Aorta: Mildly dilated aortic root and ascending aorta. Aortic   root dimension: 39 mm (ED). Ascending aortic diameter: 37 mm (S). - Mitral valve: There was trivial regurgitation. - Left atrium: The atrium was mildly dilated. - Right ventricle: The cavity size was normal. Systolic function   was normal. - Pulmonary arteries: PA peak pressure: 33 mm Hg (S). - Inferior vena cava: The vessel was normal in size. The   respirophasic diameter changes were in the normal range (>= 50%),   consistent with normal central venous pressure.  Impressions:  - Mildly dilated LV with EF 60-65%. Normal diastolic function.   Normal RV size and systolic function. No significant valvular   abnormalities.  Patient Profile     56 yo male  with past medical history significant for chronic diastolic heart failure (grade 2 DD), hypertension, hyperlipidemia, PAF, obesity with laparoscopic gastric banding and ESRD on home hemodialysis since 07/2016 and on renal transplant list at [facility] present to the ED on 11/27/16 for evaluation of new onset feeling of uneasiness in his chest and racing heart beat. He was found to be in atrial fibrillation with RVR at a rate of 169 bpm. Urgent cardioversion was completed with conversion to sinus rhythm. He has been started on heparin IV with bridging to Coumadin.  Assessment & Plan    1. Atrial fibrillation with rapid ventricular response -Symptomatic on arrival with chest uneasiness and feeling of rapid heartbeat. No chest pain, shortness of breath, dizziness or orthopnea. -Has had several episodes of PAF in the past that spontaneously terminated without treatment, no previous anticoagulation -Diltiazem 5 mg and 10 mg given with minimal effect. Urgent cardioversion with 200 joules X1 shock to sinus rhythm. Continues in sinus rhythm in the 60's -Metoprolol 25 mg increase from once every evening to bid -CHA2DS2-VASc score 1 (HTN) Pt needs 4 weeks of anticoagulation post DCCV and possible atrial stunning. With ESRD on dialysis he is not a candidate for NOAC. He is on IV heparin with bridge to coumadin which has been initiated. Monitor INR with goal 2-3. -Once INR therapeutic can stop aspirin -Echo reassuring.  -Pt thinks he had a cardiac cath more than 10 years ago at [facility] and recalls that it was normal. Unable to find report.  2. Elevated troponins -Troponins 0.06, 0.11 due to demand ischemia in setting of afib with RVR and S/P DCCV  3. Hypertension -Well controlled on metoprolol  4. Hyperlipidemia -continue atorvastatin  5. ESRD -On hemodialysis and awaiting renal transplant, on list -Nephrology following  INR now 2 ok to DC. Will have set up in our coumadin clinic Would place on  coumadin for 4 weeks post conversion especially since he is on renal transplant list.  OK to stop ASA while therapeutic on coumadin. Resume after stopping coumadin in one month.      Signed, Candee Furbish, MD  12/03/2016, 8:22 AM

## 2016-12-03 NOTE — Progress Notes (Signed)
[facility] AR[facility] for Heparin and Coumadin  Indication: atrial fibrillation  No Known Allergies  Patient Measurements: Height: 5\' 10"  (177.8 cm) Weight: 270 lb 1 oz (122.5 kg) IBW/kg (Calculated) : 73  Ht: 70 in  Wt: 126 kg  IBW: 73 kg Heparin Dosing Weight: 101 kg  Vital Signs: Temp: 97.5 F (36.4 C) (01/03 0537) Temp Source: Oral (01/03 0537) BP: 111/61 (01/03 0537) Pulse Rate: 56 (01/03 0537)  Labs:  Recent Labs  12/01/16 0730 12/01/16 1135 12/02/16 0813 12/03/16 0602  HGB 11.5*  --  11.6* 10.8*  HCT 33.5*  --  34.7* 31.3*  PLT 171  --  153 150  LABPROT 17.3*  --  18.2* 23.2*  INR 1.40  --  1.50 2.02  HEPARINUNFRC 0.44  --  0.53 0.50  CREATININE  --  8.57*  --   --     Estimated Creatinine Clearance: 12.5 mL/min (by C-G formula based on SCr of 8.57 mg/dL (H)).   Medical History: Past Medical History:  Diagnosis Date  . Arthritis   . Cataract    bilateral- right removed, left present  . Chronic diastolic CHF (congestive heart failure) ([place])   . Complication of anesthesia    problem with bladder not waking up after surgery-for right nephrectomy  . Dysrhythmia   . Enlarged kidney    right  . Gout   . Hyperlipidemia   . Hypertension   . PAF (paroxysmal atrial fibrillation) (HCC)    happens about every 6 years-sees nishan  . Renal insufficiency    KIDNEY DISEASE - FOLLOWED BY DR. Lorrene Reid NOTES ON CHART   . heparin 1,950 Units/hr (12/02/16 1607)     Assessment: 56 y.o. M presents with new onset afib with RVR. S/p DCCV in ED 12/29. Now to begin heparin bridge to coumadin. Pt with ESRD - waiting on kidney transplant. Baseline INR 1.01 -CBC stable  -INR now therapeutic at 2.02 -Heparin level therapeutic  Goal of Therapy:  Heparin level 0.3-0.7 units/ml; INR 2-3 Monitor platelets by anticoagulation protocol: Yes   Plan:  Continue heparin 1950 units/hr Warfarin 7.5 mg po daily at 1800 pm Recommend follow up INR Friday  if discharged   Daily heparin level, INR, and CBC Monitor for S&S of bleed  Thank you Anette Guarneri, PharmD [phone] 12/03/2016 8:42 AM

## 2016-12-03 NOTE — Discharge Summary (Signed)
Physician Discharge Summary  [name] [name] KKX:[id number] DOB: 10/03/1961 DOA: 11/27/2016  PCP: Pcp Not In System  Admit date: 11/27/2016 Discharge date: 12/03/2016  Admitted From: Home Disposition:  Home  Recommendations for Outpatient Follow-up:  1. Follow up with PCP in 1-2 weeks 2. Follow up with coumadin clinic as set up by cardiology, next INR check Friday 12/05/2016. Continue warfarin 7.5mg  daily at 1800.  3. Follow up with your regular hemodialysis sessions 4. Please obtain BMP/CBC in one week   Home Health: no  Equipment/Devices: none   Discharge Condition: stable CODE STATUS: full  Diet recommendation: heart healthy  Brief/Interim Summary: [name] [name] a 56 y.o.malewith medical history significant of chronic diastolic heart failure, hypertension, hyperlipidemia, paroxysmal atrial fibrillation, and ESRD on home hemodialysis, who presented with palpitation and was found to be in A. fib with RVR heart rate in the 180s, did not respond to diltiazem, status post DCCV, successfully converted to sinus rhythm, on heparin bridge and warfarin target INR 2-3.   Subjective on day of discharge: Doing well this morning. No complaints, denies any fevers, chest pain, cough, shortness of breath, nausea, vomiting, diarrhea, abdominal pain, dysuria. Patient is tolerating meals and ambulating well.    Discharge Diagnoses:  Principal Problem:   Atrial fibrillation with RVR ([place]) Active Problems:   Hypertension   ESRD on dialysis ([place])   Chronic diastolic CHF (congestive heart failure) (HCC)   Hypokalemia   HLD (hyperlipidemia)   Atrial fibrillation (HCC)  Paroxysmal atrial fibrillation Patient presented with rapid ventricular response. Did not improve with diltiazem infusion. Patient was cardioverted. Cardiology is following. Patient is on a beta blocker, which will be continued. TSH is normal. Echocardiogram as above. CHADS2Vasc 1. Heparin bridge to warfarin with target INR  2-3. Patient will need anticoagulation for about 4 weeks per cardiology. Follow with coumadin clinic.   Elevated troponins Secondary to demand ischemia from A. Fib. Cardiology is following. No plans for ischemic workup in the hospital.  ESRD on home HD Nephrology consulted. Patient was dialyzed on Saturday and then on Monday.    Essential Hypertension Continue home medications. Blood pressure is reasonably well controlled.  Hyperlipidemia Continue with statin  Hx obesity sp gastric lapband Stable  Discharge Instructions  Discharge Instructions    Diet - low sodium heart healthy    Complete by:  As directed    Discharge instructions    Complete by:  As directed    Recommendations for Outpatient Follow-up:  Follow up with PCP in 1-2 weeks Follow up with coumadin clinic as set up by cardiology, next INR check Friday 12/05/2016. Continue warfarin 7.5mg  daily at 1800.  Follow up with your regular hemodialysis sessions Please obtain BMP/CBC in one week   Increase activity slowly    Complete by:  As directed      Allergies as of 12/03/2016   No Known Allergies     Medication List    STOP taking these medications   aspirin EC 81 MG tablet     TAKE these medications   atorvastatin 10 MG tablet Commonly known as:  LIPITOR Take 10 mg by mouth every evening.   b complex-vitamin c-folic acid 0.8 MG Tabs tablet Take 1 tablet by mouth every evening.   metoprolol tartrate 25 MG tablet Commonly known as:  LOPRESSOR Take 1 tablet (25 mg total) by mouth 2 (two) times daily. What changed:  when to take this   warfarin 7.5 MG tablet Commonly known as:  COUMADIN Take 1  tablet (7.5 mg total) by mouth daily at 6 PM.      Follow-up Information    [facility]. Schedule an appointment as soon as possible for a visit in 1 week(s).   Contact information: [place] [id number] [phone]       Mark Skains, MD.  Schedule an appointment as soon as possible for a visit in 1 week(s).   Specialty:  Cardiology Why:  Coumadin Clinic follow up on Friday 12/05/2016 Contact information: [address] [place] [address] [phone]          No Known Allergies  Consultations: Cardiology  Nephrology   Procedures/Studies: DC cardioversion  Transthoracic echocardiogram Study Conclusions - Left ventricle: The cavity size was mildly dilated. Wall thickness was normal. Systolic function was normal. The estimated ejection fraction was in the range of 60% to 65%. Wall motion was normal; there were no regional wall motion abnormalities. Left ventricular diastolic function parameters were normal. - Aortic valve: There was no stenosis. - Aorta: Mildly dilated aortic root and ascending aorta. Aortic root dimension: 39 mm (ED). Ascending aortic diameter: 37 mm (S). - Mitral valve: There was trivial regurgitation. - Left atrium: The atrium was mildly dilated. - Right ventricle: The cavity size was normal. Systolic function was normal. - Pulmonary arteries: PA peak pressure: 33 mm Hg (S). - Inferior vena cava: The vessel was normal in size. The respirophasic diameter changes were in the normal range (>= 50%), consistent with normal central venous pressure. Impressions: - Mildly dilated LV with EF 60-65%. Normal diastolic function. Normal RV size and systolic function. No significant valvular abnormalities.  Hemodialysis   Discharge Exam: Vitals:   12/03/16 0537 12/03/16 0926  BP: 111/61 127/70  Pulse: (!) 56 (!) 57  Resp: 18 16  Temp: 97.5 F (36.4 C) 98.9 F (37.2 C)   Vitals:   12/02/16 1700 12/02/16 2115 12/03/16 0537 12/03/16 0926  BP: 124/70 136/71 111/61 127/70  Pulse: 65 68 (!) 56 (!) 57  Resp: 20 18 18 16   Temp: 98.3 F (36.8 C) 97.9 F (36.6 C) 97.5 F (36.4 C) 98.9 F (37.2 C)  TempSrc: Oral Oral Oral Oral  SpO2: 100% 100% 100%  100%  Weight:      Height:        General: Pt is alert, awake, not in acute distress Cardiovascular: RRR, S1/S2 +, no rubs, no gallops Respiratory: CTA bilaterally, no wheezing, no rhonchi Abdominal: Soft, NT, ND, bowel sounds + Extremities: no edema, no cyanosis    The results of significant diagnostics from this hospitalization (including imaging, microbiology, ancillary and laboratory) are listed below for reference.     Microbiology: Recent Results (from the past 240 hour(s))  MRSA PCR Screening     Status: None   Collection Time: 11/28/16  4:28 AM  Result Value Ref Range Status   MRSA by PCR NEGATIVE NEGATIVE Final    Comment:        The GeneXpert MRSA Assay (FDA approved for NASAL specimens only), is one component of a comprehensive MRSA colonization surveillance program. It is not intended to diagnose MRSA infection nor to guide or monitor treatment for MRSA infections.      Labs: BNP (last 3 results) No results for input(s): BNP in the last 8760 hours. Basic Metabolic Panel:  Recent Labs Lab 11/27/16 2159 11/28/16 0346 11/29/16 0249 12/01/16 1135  NA 135 137 138 138  K 3.1* 3.3* 3.4*  3.7  CL 98* 100* 102 102  CO2 23 27 25 26   GLUCOSE 106* 123* 98 89  BUN 22* 23* 37* 49*  CREATININE 5.43* 5.83* 7.05* 8.57*  CALCIUM 9.2 8.9 9.3 9.5  MG 1.9  --   --   --   PHOS  --   --   --  3.6   Liver Function Tests:  Recent Labs Lab 11/27/16 2159 12/01/16 1135  AST 26  --   ALT 26  --   ALKPHOS 56  --   BILITOT 1.0  --   PROT 7.3  --   ALBUMIN 3.9 3.5   No results for input(s): LIPASE, AMYLASE in the last 168 hours. No results for input(s): AMMONIA in the last 168 hours. CBC:  Recent Labs Lab 11/27/16 2159  11/29/16 0249 11/30/16 0234 12/01/16 0730 12/02/16 0813 12/03/16 0602  WBC 9.6  < > 7.6 7.7 8.2 7.9 7.6  NEUTROABS 5.6  --   --   --   --   --   --   HGB 11.8*  < > 10.4* 10.6* 11.5* 11.6* 10.8*  HCT 34.4*  < > 30.4* 31.8* 33.5* 34.7*  31.3*  MCV 96.4  < > 95.3 95.2 95.2 97.2 94.3  PLT 151  < > 167 161 171 153 150  < > = values in this interval not displayed. Cardiac Enzymes:  Recent Labs Lab 11/28/16 0346 11/28/16 1010 11/28/16 1600  TROPONINI 0.06* 0.11* 0.11*   BNP: Invalid input(s): POCBNP CBG: No results for input(s): GLUCAP in the last 168 hours. D-Dimer No results for input(s): DDIMER in the last 72 hours. Hgb A1c No results for input(s): HGBA1C in the last 72 hours. Lipid Profile No results for input(s): CHOL, HDL, LDLCALC, TRIG, CHOLHDL, LDLDIRECT in the last 72 hours. Thyroid function studies No results for input(s): TSH, T4TOTAL, T3FREE, THYROIDAB in the last 72 hours.  Invalid input(s): FREET3 Anemia work up No results for input(s): VITAMINB12, FOLATE, FERRITIN, TIBC, IRON, RETICCTPCT in the last 72 hours. Urinalysis No results found for: COLORURINE, APPEARANCEUR, [place], [place], [place], [place], [place], [place], PROTEINUR, UROBILINOGEN, NITRITE, LEUKOCYTESUR Sepsis Labs Invalid input(s): PROCALCITONIN,  WBC,  LACTICIDVEN Microbiology Recent Results (from the past 240 hour(s))  MRSA PCR Screening     Status: None   Collection Time: 11/28/16  4:28 AM  Result Value Ref Range Status   MRSA by PCR NEGATIVE NEGATIVE Final    Comment:        The GeneXpert MRSA Assay (FDA approved for NASAL specimens only), is one component of a comprehensive MRSA colonization surveillance program. It is not intended to diagnose MRSA infection nor to guide or monitor treatment for MRSA infections.      Time coordinating discharge: Over 30 minutes  SIGNED:  Dessa Phi, DO [facility] Pager [phone]  If 7PM-7AM, please contact night-coverage [email or web address] Password [facility] 12/03/2016, 12:36 PM

## 2016-12-03 NOTE — Progress Notes (Signed)
[facility] Progress Note   Subjective:  Up in chair, wife at bedside.  No c/os today Ready to go home INR 2.02     Objective Vitals:   12/02/16 1700 12/02/16 2115 12/03/16 0537 12/03/16 0926  BP: 124/70 136/71 111/61 127/70  Pulse: 65 68 (!) 56 (!) 57  Resp: 20 18 18 16   Temp: 98.3 F (36.8 C) 97.9 F (36.6 C) 97.5 F (36.4 C) 98.9 F (37.2 C)  TempSrc: Oral Oral Oral Oral  SpO2: 100% 100% 100% 100%  Weight:      Height:       Physical Exam General: WN,WD WM NAD Pleasant  Heart: S1,S2, RRR Lungs: BBS CTAB A/P Abdomen: soft, NT Extremities: No LE edema Dialysis Access: RUA AVF + bruit   Additional Objective   Recent Labs Lab 11/28/16 0346 11/29/16 0249 12/01/16 1135  NA 137 138 138  K 3.3* 3.4* 3.7  CL 100* 102 102  CO2 27 25 26   GLUCOSE 123* 98 89  BUN 23* 37* 49*  CREATININE 5.83* 7.05* 8.57*  CALCIUM 8.9 9.3 9.5  PHOS  --   --  3.6     Recent Labs Lab 11/27/16 2159 12/01/16 1135  AST 26  --   ALT 26  --   ALKPHOS 56  --   BILITOT 1.0  --   PROT 7.3  --   ALBUMIN 3.9 3.5     Recent Labs Lab 11/27/16 2159  11/29/16 0249 11/30/16 0234 12/01/16 0730 12/02/16 0813 12/03/16 0602  WBC 9.6  < > 7.6 7.7 8.2 7.9 7.6  NEUTROABS 5.6  --   --   --   --   --   --   HGB 11.8*  < > 10.4* 10.6* 11.5* 11.6* 10.8*  HCT 34.4*  < > 30.4* 31.8* 33.5* 34.7* 31.3*  MCV 96.4  < > 95.3 95.2 95.2 97.2 94.3  PLT 151  < > 167 161 171 153 150  < > = values in this interval not displayed. Lab Results  Component Value Date   INR 2.02 12/03/2016   INR 1.50 12/02/2016   INR 1.40 12/01/2016     Recent Labs Lab 11/28/16 0346 11/28/16 1010 11/28/16 1600  TROPONINI 0.06* 0.11* 0.11*   Medications: . heparin 1,950 Units/hr (12/03/16 0700)   . aspirin EC  81 mg Oral QPM  . atorvastatin  10 mg Oral QPM  . lidocaine-prilocaine   Topical Once  . metoprolol tartrate  25 mg Oral BID  . multivitamin  1 tablet Oral QHS  . pantoprazole  40  mg Oral Daily  . senna  1 tablet Oral Daily  . warfarin  7.5 mg Oral q1800  . Warfarin - Pharmacist Dosing Inpatient   Does not apply q1800     Dialysis Orders: Home HD NxtStage, 5 days per week, 3.5 hr each, dry wt 123kg, 5000 hep, 15ga sharp needles   Assessment/Plan: 1. Afib with RVR - sp DCCV in ED 11/27/16. In NSR now,bridging heparin to coumadin, INR therapeutic today. Ready for d/c Will be managed by Coumadin Clinic.  Metoprolol increased per cards.  2. ESRD on home HD, started in June this year. Followed by Dr. Lorrene Reid. Wife mayplace needles - do 4 hr 3 x a week while here. Last HD 12/01/16.  Plan HD at home today 1/3 following D/C  3. HTN/vol: HD 12/01/16 Pre wt 123.1 kg Net UF 600 Post wt 122.5 kg. BP soft toward end of tx. Believe 123  kg appropriate wt. Allow EDW to drift back up to OP EDW of 123 kg. Run even next tx.  4. Hx R nephrectomy for massive R hydro - done to make room for transplant in the future. On transplant list at [facility].  5. Hx obesity sp gastric lap-band 6. Elevated troponin: Demand ischemia possibly related to Afib/RBR. Cardiology following.  7. Anemia - hgb 10.8 - follow- no ESA - on weekly IV Fe per pt - last tsat 46% early Dec 8. 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 9. Albumin 3.5 Renal Diet/Renal Vit/Nepro.   Lynnda Child PA-C [facility] Pager [phone] 12/03/2016,10:07 AM   I have seen and examined this patient and agree with plan and assessment in the above note with renal recommendations/intervention highlighted. OK for discharge. Resume NxStage at home. Coumadin clinic will follow INR's.  Nedim Oki B,MD 12/03/2016 2:27 PM

## 2016-12-04 ENCOUNTER — Telehealth: Payer: Self-pay | Admitting: Pharmacist

## 2016-12-04 NOTE — Telephone Encounter (Signed)
Pt scheduled for new Coumadin appt on Friday 1/5 at 2:30pm with plan for 4 weeks of Coumadin therapy then d/c.

## 2016-12-04 NOTE — Telephone Encounter (Signed)
-----   Message from Jerline Pain, MD sent at 12/03/2016  8:23 AM EST ----- Regarding: Needs new coumadin clinic appt Just started coumadin, post DC CV. CHADS-VASC 1 Needs coumadin for 4 weeks total. After that ok to DC.  Please set up appt to get INR checked on Thurs or Fri (he is on dialysis)  Thanks  Candee Furbish, MD

## 2016-12-05 ENCOUNTER — Ambulatory Visit (INDEPENDENT_AMBULATORY_CARE_PROVIDER_SITE_OTHER): Payer: Commercial Managed Care - HMO | Admitting: *Deleted

## 2016-12-05 DIAGNOSIS — I480 Paroxysmal atrial fibrillation: Secondary | ICD-10-CM | POA: Diagnosis not present

## 2016-12-05 DIAGNOSIS — Z5181 Encounter for therapeutic drug level monitoring: Secondary | ICD-10-CM

## 2016-12-05 HISTORY — DX: Encounter for therapeutic drug level monitoring: Z51.81

## 2016-12-05 LAB — POCT INR: INR: 3.9

## 2016-12-05 NOTE — Patient Instructions (Signed)

## 2016-12-11 ENCOUNTER — Encounter (INDEPENDENT_AMBULATORY_CARE_PROVIDER_SITE_OTHER): Payer: Self-pay

## 2016-12-11 ENCOUNTER — Ambulatory Visit (INDEPENDENT_AMBULATORY_CARE_PROVIDER_SITE_OTHER): Payer: Commercial Managed Care - HMO | Admitting: Cardiology

## 2016-12-11 ENCOUNTER — Ambulatory Visit (INDEPENDENT_AMBULATORY_CARE_PROVIDER_SITE_OTHER): Payer: Commercial Managed Care - HMO | Admitting: *Deleted

## 2016-12-11 ENCOUNTER — Encounter: Payer: Self-pay | Admitting: Cardiology

## 2016-12-11 VITALS — BP 120/76 | HR 66 | Ht 70.0 in | Wt 278.0 lb

## 2016-12-11 DIAGNOSIS — N186 End stage renal disease: Secondary | ICD-10-CM

## 2016-12-11 DIAGNOSIS — E782 Mixed hyperlipidemia: Secondary | ICD-10-CM

## 2016-12-11 DIAGNOSIS — I480 Paroxysmal atrial fibrillation: Secondary | ICD-10-CM

## 2016-12-11 DIAGNOSIS — Z5181 Encounter for therapeutic drug level monitoring: Secondary | ICD-10-CM

## 2016-12-11 DIAGNOSIS — I10 Essential (primary) hypertension: Secondary | ICD-10-CM

## 2016-12-11 DIAGNOSIS — Z992 Dependence on renal dialysis: Secondary | ICD-10-CM

## 2016-12-11 DIAGNOSIS — Z7901 Long term (current) use of anticoagulants: Secondary | ICD-10-CM

## 2016-12-11 LAB — POCT INR: INR: 4.7

## 2016-12-11 NOTE — Progress Notes (Signed)
Cardiology Office Note   Date:  12/11/2016   ID:  [name], DOB 02-21-1961, MRN [id number]  PCP:  No PCP Per Patient  Cardiologist:  Dr. Johnsie Cancel    Chief Complaint  Patient presents with  . Hospitalization Follow-up    a fib with RVR with DCCV.      History of Present Illness: [name] [name] is a 56 y.o. male who presents for post hospitalization for a fib RVR,  Did not convert with IV dilt so had urgent DCCV 11/28/16 to SR.  CHADS2Vasc 1. Heparin bridge to warfarin with target INR 2-3. Patient will need anticoagulation for about 4 weeks. Followed in coumadin clinic. pk troponin 0.11.    He has a hx of ESRD on HD at home M thru F, obesity with gastric lapband. HLD, HTN, PAF , Reported normal LHC in 2003.  Being worked up for renal transplant at [facility]    Today no complaints.  INR is therapeutic. ASA has been stopped.   No further rapid Heart Beat.     Past Medical History:  Diagnosis Date  . Arthritis   . Cataract    bilateral- right removed, left present  . Chronic diastolic CHF (congestive heart failure) ([place])   . Complication of anesthesia    problem with bladder not waking up after surgery-for right nephrectomy  . Dysrhythmia   . Enlarged kidney    right  . Gout   . Hyperlipidemia   . Hypertension   . PAF (paroxysmal atrial fibrillation) ([facility])    happens about every 6 years-sees nishan  . Renal insufficiency    KIDNEY DISEASE - FOLLOWED BY DR. Lorrene Reid NOTES ON CHART    Past Surgical History:  Procedure Laterality Date  . AV FISTULA PLACEMENT     "IT CLOSED UP AND DOES NOT WORK"  . AV FISTULA PLACEMENT Right 10/27/2014   Procedure: ARTERIOVENOUS (AV) FISTULA CREATION;  Surgeon: Conrad Perryman, MD;  Location: [place];  Service: Vascular;  Laterality: Right;  . AV FISTULA PLACEMENT Right 08/22/2015   Procedure: BRACHIOCEPHALIC ARTERIOVENOUS (AV) FISTULA CREATION;  Surgeon: Conrad [place], MD;  Location: [place];  Service: Vascular;  Laterality: Right;  .  CATARACTS REMOVED Right   . CHOLECYSTECTOMY N/A 07/13/2015   Procedure: LAPAROSCOPIC CHOLECYSTECTOMY WITH INTRAOPERATIVE CHOLANGIOGRAM;  Surgeon: Johnathan Hausen, MD;  Location: [facility];  Service: General;  Laterality: N/A;  . EYE SURGERY     left cataract surgery-lens implant  . HERNIA REPAIR    . INSERTION OF MESH N/A 06/20/2014   Procedure: INSERTION OF MESH;  Surgeon: Pedro Earls, MD;  Location: [facility];  Service: General;  Laterality: N/A;  . KNEE SURGERY  2001  . LAPAROSCOPIC GASTRIC BANDING N/A 06/20/2014   Procedure: LAPAROSCOPIC GASTRIC BANDING and VENTRAL HERNIA REPAIR WITH MESH;  Surgeon: Pedro Earls, MD;  Location: [facility];  Service: General;  Laterality: N/A;  . TOTAL NEPHRECTOMY Right      Current Outpatient Prescriptions  Medication Sig Dispense Refill  . atorvastatin (LIPITOR) 10 MG tablet Take 10 mg by mouth every evening.     Marland Kitchen b complex-vitamin c-folic acid (NEPHRO-VITE) 0.8 MG TABS tablet Take 1 tablet by mouth every evening.    . metoprolol tartrate (LOPRESSOR) 25 MG tablet Take 1 tablet (25 mg total) by mouth 2 (two) times daily. 60 tablet 0  . warfarin (COUMADIN) 7.5 MG tablet Take 1 tablet (7.5 mg total) by mouth daily at 6 PM. 30 tablet 0  No current facility-administered medications for this visit.     Allergies:   Patient has no known allergies.    Social History:  The patient  reports that he has never smoked. He has never used smokeless tobacco. He reports that he does not drink alcohol or use drugs.   Family History:  The patient's family history includes Cancer in his father, sister, and sister; Heart disease in his mother; Hypertension in his mother.    ROS:  General:no colds or fevers, no weight changes Skin:no rashes or ulcers HEENT:no blurred vision, no congestion CV:see HPI PUL:see HPI GI:no diarrhea constipation or melena, no indigestion GU:no hematuria, no dysuria MS:no joint pain, no claudication Neuro:no syncope, no  lightheadedness Endo:no diabetes, no thyroid disease  Wt Readings from Last 3 Encounters:  12/11/16 278 lb (126.1 kg)  12/01/16 270 lb 1 oz (122.5 kg)  01/10/16 279 lb 1.9 oz (126.6 kg)     PHYSICAL EXAM: VS:  BP 120/76   Pulse 66   Ht 5\' 10"  (1.778 m)   Wt 278 lb (126.1 kg)   BMI 39.89 kg/m  , BMI Body mass index is 39.89 kg/m. General:Pleasant affect, NAD Skin:Warm and dry, brisk capillary refill HEENT:normocephalic, sclera clear, mucus membranes moist Neck:supple, no JVD, no bruits  Heart:S1S2 RRR without murmur, gallup, rub or click Lungs:clear without rales, rhonchi, or wheezes ZOX:WRUE, non tender, + BS, do not palpate liver spleen or masses Ext:no lower ext edema, 2+ pedal pulses, 2+ radial pulses Neuro:alert and oriented, MAE, follows commands, + facial symmetry    EKG:  EKG is ordered today. The ekg ordered today demonstrates SR with normal EKG.    Recent Labs: 11/27/2016: ALT 26; Magnesium 1.9; TSH 2.626 12/01/2016: BUN 49; Creatinine, Ser 8.57; Potassium 3.7; Sodium 138 12/03/2016: Hemoglobin 10.8; Platelets 150    Lipid Panel    Component Value Date/Time   CHOL 74 11/28/2016 0346   TRIG 56 11/28/2016 0346   HDL 24 (L) 11/28/2016 0346   CHOLHDL 3.1 11/28/2016 0346   VLDL 11 11/28/2016 0346   LDLCALC 39 11/28/2016 0346       Other studies Reviewed: Additional studies/ records that were reviewed today include: . Echo  - Left ventricle: The cavity size was mildly dilated. Wall thickness was normal. Systolic function was normal. The estimatedejection fraction was in the range of 60% to 65%. Wall motion wasnormal; there were no regional wall motion abnormalities. Leftventricular diastolic function parameters were normal. - Aortic valve: There was no stenosis. - Aorta: Mildly dilated aortic root and ascending aorta. Aortic root dimension: 39 mm (ED). Ascending aortic diameter: 37 mm (S). - Mitral valve: There was trivial regurgitation. - Left  atrium: The atrium was mildly dilated. - Right ventricle: The cavity size was normal. Systolic function was normal. - Pulmonary arteries: PA peak pressure: 33 mm Hg (S). - Inferior vena cava: The vessel was normal in size. The respirophasic diameter changes were in the normal range (>= 50%), consistent with normal central venous pressure.  Impressions:  - Mildly dilated LV with EF 60-65%. Normal diastolic function. Normal RV size and systolic function. No significant valvular abnormalities.   ASSESSMENT AND PLAN:  1.  PAF with DCCV on 12.29/17 maintaining SR.  No chest pain. He may stop coumadin 01/01/17 if no further episodes of a fib.  He will then resume asprin.  Inr is therapeutic.   2. Elevated troponin, most likely due to rapid a fib, but it has been almost a year since last  nuc - I discussed with Dr. Johnsie Cancel and he recommends lexiscan myoview at [facility] as they have done the last two.  Follow up here prn.   3.  ESRD on home HD.  Labs are done at home.     Current medicines are reviewed with the patient today.  The patient Has no concerns regarding medicines.  The following changes have been made:  See above Labs/ tests ordered today include:see above  Disposition:   FU:  see above  Signed, Cecilie Kicks, NP  12/11/2016 3:26 PM    [facility] [address], [place], [place], [place] Phone: [phone]; Fax: [phone]

## 2016-12-11 NOTE — Patient Instructions (Signed)
Medication Instructions:  Your physician recommends that you continue on your current medications as directed. Please refer to the Current Medication list given to you today.   Labwork: None ordered  Testing/Procedures: None ordered  Follow-Up: Your physician recommends that you schedule a follow-up appointment as needed with Dr.Nishan   Any Other Special Instructions Will Be Listed Below (If Applicable). We recommend that you have a stress test @ [facility]     If you need a refill on your cardiac medications before your next appointment, please call your pharmacy.

## 2016-12-12 ENCOUNTER — Telehealth: Payer: Self-pay | Admitting: *Deleted

## 2016-12-12 ENCOUNTER — Encounter: Payer: Self-pay | Admitting: Cardiology

## 2016-12-12 NOTE — Telephone Encounter (Signed)
Called pt to get fax # to send his clearance over for his transplant.  It is to attention:  Monia Pouch Fax# [phone]  Letter, o/v note, and d/c summary from recent hospitalization has been sent over.

## 2016-12-17 ENCOUNTER — Encounter (HOSPITAL_COMMUNITY): Payer: Self-pay

## 2016-12-23 ENCOUNTER — Ambulatory Visit (INDEPENDENT_AMBULATORY_CARE_PROVIDER_SITE_OTHER): Payer: Commercial Managed Care - HMO | Admitting: *Deleted

## 2016-12-23 DIAGNOSIS — Z5181 Encounter for therapeutic drug level monitoring: Secondary | ICD-10-CM

## 2016-12-23 DIAGNOSIS — I480 Paroxysmal atrial fibrillation: Secondary | ICD-10-CM

## 2016-12-23 LAB — POCT INR: INR: 1.8

## 2017-07-03 ENCOUNTER — Emergency Department (HOSPITAL_COMMUNITY)
Admission: EM | Admit: 2017-07-03 | Discharge: 2017-07-03 | Disposition: A | Discharge status: Home / Self Care | Payer: 59 | Attending: Emergency Medicine | Admitting: Emergency Medicine

## 2017-07-03 ENCOUNTER — Encounter (HOSPITAL_COMMUNITY): Payer: Self-pay | Admitting: Emergency Medicine

## 2017-07-03 ENCOUNTER — Emergency Department (HOSPITAL_COMMUNITY): Payer: 59

## 2017-07-03 DIAGNOSIS — Y999 Unspecified external cause status: Secondary | ICD-10-CM | POA: Insufficient documentation

## 2017-07-03 DIAGNOSIS — I132 Hypertensive heart and chronic kidney disease with heart failure and with stage 5 chronic kidney disease, or end stage renal disease: Secondary | ICD-10-CM | POA: Diagnosis not present

## 2017-07-03 DIAGNOSIS — Z7982 Long term (current) use of aspirin: Secondary | ICD-10-CM | POA: Insufficient documentation

## 2017-07-03 DIAGNOSIS — M25522 Pain in left elbow: Secondary | ICD-10-CM

## 2017-07-03 DIAGNOSIS — I5032 Chronic diastolic (congestive) heart failure: Secondary | ICD-10-CM | POA: Diagnosis not present

## 2017-07-03 DIAGNOSIS — Y92009 Unspecified place in unspecified non-institutional (private) residence as the place of occurrence of the external cause: Secondary | ICD-10-CM | POA: Insufficient documentation

## 2017-07-03 DIAGNOSIS — M25512 Pain in left shoulder: Secondary | ICD-10-CM | POA: Diagnosis not present

## 2017-07-03 DIAGNOSIS — M79632 Pain in left forearm: Secondary | ICD-10-CM | POA: Diagnosis not present

## 2017-07-03 DIAGNOSIS — W108XXA Fall (on) (from) other stairs and steps, initial encounter: Secondary | ICD-10-CM | POA: Diagnosis not present

## 2017-07-03 DIAGNOSIS — S0081XA Abrasion of other part of head, initial encounter: Secondary | ICD-10-CM | POA: Insufficient documentation

## 2017-07-03 DIAGNOSIS — W19XXXA Unspecified fall, initial encounter: Secondary | ICD-10-CM

## 2017-07-03 DIAGNOSIS — Z79899 Other long term (current) drug therapy: Secondary | ICD-10-CM | POA: Insufficient documentation

## 2017-07-03 DIAGNOSIS — Z23 Encounter for immunization: Secondary | ICD-10-CM | POA: Insufficient documentation

## 2017-07-03 DIAGNOSIS — S0993XA Unspecified injury of face, initial encounter: Secondary | ICD-10-CM | POA: Diagnosis present

## 2017-07-03 DIAGNOSIS — Y9389 Activity, other specified: Secondary | ICD-10-CM | POA: Insufficient documentation

## 2017-07-03 DIAGNOSIS — Z992 Dependence on renal dialysis: Secondary | ICD-10-CM | POA: Insufficient documentation

## 2017-07-03 DIAGNOSIS — N186 End stage renal disease: Secondary | ICD-10-CM | POA: Insufficient documentation

## 2017-07-03 HISTORY — DX: Dependence on renal dialysis: Z99.2

## 2017-07-03 HISTORY — DX: End stage renal disease: N18.6

## 2017-07-03 MED ORDER — TETANUS-DIPHTH-ACELL PERTUSSIS 5-2.5-18.5 LF-MCG/0.5 IM SUSP
0.5000 mL | Freq: Once | INTRAMUSCULAR | Status: AC
  Administered 2017-07-03: 0.5 mL via INTRAMUSCULAR
  Filled 2017-07-03: qty 0.5

## 2017-07-03 NOTE — Discharge Instructions (Signed)
Take Tylenol as needed for pain. Apply ice or heat to the affected areas for comfort. You may wear the shoulder sling as needed for comfort. Do some gentle stretching during hot showers or baths to avoid muscle stiffness. Follow up with your primary care physician for reevaluation if symptoms persist. Return to the ED immediately if any concerning signs or symptoms develop such as shortness of breath, altered mental status, or other neurological symptoms.

## 2017-07-03 NOTE — ED Notes (Signed)
Patient transported to X-ray 

## 2017-07-03 NOTE — ED Triage Notes (Signed)
To ED via [facility] from home-- pt fell 18 steps backwards with a dryer landing on top of chest-- pt is a dialysis pt - does hemodialysis at home, M-F, LOC x 2-3 sec- son had to move dryer off of chest- pt has abrasions, to forehead, left arm, left arm swelling, bruising to chest wall, left arm, left leg--  Pt was hypotensive at the scene, 80/p, EMS started IV, pressure increased to 130/88.  Pt is alert/oriented x 4, w/d,

## 2017-07-03 NOTE — Progress Notes (Signed)
Orthopedic Tech Progress Note Patient Details:  [name] [name] 06-25-1961 [id number]  Ortho Devices Type of Ortho Device: Sling immobilizer Ortho Device/Splint Location: lue Ortho Device/Splint Interventions: Ordered, Application   Karolee Stamps 07/03/2017, 4:53 PM

## 2017-07-03 NOTE — ED Provider Notes (Signed)
[name] [name] DEPT Provider Note   CSN: [id number] Arrival date & time: 07/03/17  1341     History   Chief Complaint Chief Complaint  Patient presents with  . Fall    HPI [name] [name] is a 56 y.o. 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 Patient states that he was helping his son carrying a dryer up the stairs when they were unable to hold onto the object and the patient fell down 18 steps backwards and landed on his back. The dryer fell on top of his chest as a result. Patient's son endorses 2-3 seconds of loss of consciousness, and the son was able to remove the dryer off the patient's chest. He states he was immediately able to sit up as they waited for EMS to arrive. On the scene he was hypotensive at 80/palpable which improved after EMS started IV. Currently he endorses constant aching left shoulder and elbow pain which worsens with movement and palpation. He denies numbness, tingling, or weakness. He also endorses left lateral chest pain which occurs on deep breaths only. He denies headache, nausea, vomiting, confusion, difficulty ambulating, shortness of breath, or abdominal pain. He denies loss of bowel or bladder control. He does have multiple abrasions to to his body, primarily to his chest, left arm, and right forehead. Has not tried anything for his symptoms. Is unsure if he is up-to-date on his tetanus. He is not on any anticoagulants but does receive a small amount of heparin during dialysis. The history is provided by the patient.    Past Medical History:  Diagnosis Date  . Arthritis   . Cataract    bilateral- right removed, left present  . Chronic diastolic CHF (congestive heart failure) ([place])   . Complication of anesthesia    problem with bladder not waking up after surgery-for right nephrectomy  .  Dysrhythmia   . Enlarged kidney    right  . ESRD (end stage renal disease) on dialysis ([place])   . Gout   . Hyperlipidemia   . Hypertension   . PAF (paroxysmal atrial fibrillation) ([facility])    happens about every 6 years-sees nishan  . Renal insufficiency    KIDNEY DISEASE - FOLLOWED BY DR. Lorrene Reid NOTES ON CHART    Patient Active Problem List   Diagnosis Date Noted  . Encounter for therapeutic drug monitoring 12/05/2016  . ESRD on dialysis ([place]) 11/28/2016  . Hypokalemia 11/28/2016  . HLD (hyperlipidemia) 11/28/2016  . Atrial fibrillation ([place]) 11/28/2016  . Chronic diastolic CHF (congestive heart failure) ([place])   . Status post laparoscopic cholecystectomy August 2016 07/13/2015  . Renal failure 07/13/2015  . Chronic kidney disease, stage V ([place]) 09/18/2014  . Encounter for adequacy testing for dialysis ([place]) 09/18/2014  . S/P repair of ventral hernia 06/21/2014  . Lapband APS July 2015 06/20/2014  . Status post gastric banding 06/20/2014  . Morbid obesity BMI 42 01/04/2014  . Hypertension   . Renal disorder   . Gout   . Atrial fibrillation with RVR [facility])     Past Surgical History:  Procedure Laterality Date  . AV FISTULA PLACEMENT     "IT CLOSED UP AND DOES NOT WORK"  . AV FISTULA PLACEMENT Right 10/27/2014   Procedure: ARTERIOVENOUS (AV) FISTULA CREATION;  Surgeon: Conrad [place], MD;  Location: [place];  Service: Vascular;  Laterality: Right;  . AV FISTULA PLACEMENT Right 08/22/2015   Procedure: BRACHIOCEPHALIC ARTERIOVENOUS (AV) FISTULA CREATION;  Surgeon: Conrad Charlotte, MD;  Location: [place];  Service: Vascular;  Laterality: Right;  . CATARACTS REMOVED Right   . CHOLECYSTECTOMY N/A 07/13/2015   Procedure: LAPAROSCOPIC CHOLECYSTECTOMY WITH INTRAOPERATIVE CHOLANGIOGRAM;  Surgeon: Johnathan Hausen, MD;  [facility];  Service: General;  Laterality: N/A;  . EYE SURGERY     left cataract surgery-lens implant  . HERNIA REPAIR    . INSERTION OF MESH N/A 06/20/2014   Procedure:  INSERTION OF MESH;  Surgeon: Pedro Earls, MD;  [facility];  Service: General;  Laterality: N/A;  . KNEE SURGERY  2001  . LAPAROSCOPIC GASTRIC BANDING N/A 06/20/2014   Procedure: LAPAROSCOPIC GASTRIC BANDING and VENTRAL HERNIA REPAIR WITH MESH;  Surgeon: Pedro Earls, MD;  [facility];  Service: General;  Laterality: N/A;  . TOTAL NEPHRECTOMY Right        Home Medications    Prior to Admission medications   Medication Sig Start Date End Date Taking? Authorizing Provider  aspirin 81 MG chewable tablet Chew 81 mg by mouth daily.   Yes [provider]  atorvastatin (LIPITOR) 10 MG tablet Take 10 mg by mouth every evening.    Yes [provider]  b complex-vitamin c-folic acid (NEPHRO-VITE) 0.8 MG TABS tablet Take 1 tablet by mouth every evening.   Yes [provider]  calcitRIOL (ROCALTROL) 0.25 MCG capsule Take 0.25 mcg by mouth daily. 06/22/17  Yes [provider]  calcium carbonate (TUMS - DOSED IN MG ELEMENTAL CALCIUM) 500 MG chewable tablet Chew 1 tablet by mouth 3 (three) times daily with meals.   Yes [provider]  lidocaine-prilocaine (EMLA) cream Apply 1 application topically daily. 05/03/17  Yes [provider]  metoprolol tartrate (LOPRESSOR) 25 MG tablet Take 1 tablet (25 mg total) by mouth 2 (two) times daily. Patient taking differently: Take 25 mg by mouth daily.  12/03/16  Yes Dessa Phi Chahn-Yang, DO  warfarin (COUMADIN) 7.5 MG tablet Take 1 tablet (7.5 mg total) by mouth daily at 6 PM. Patient not taking: Reported on 07/03/2017 12/03/16   Shon Millet, DO    Family History Family History  Problem Relation Age of Onset  . Hypertension Mother   . Heart disease Mother        before age 72  . Cancer Father        bone  . Cancer Sister        breast  . Cancer Sister        breast  . CAD Unknown   . Colon cancer Neg Hx   . Rectal cancer Neg Hx   . Stomach cancer Neg Hx     Social  History Social History  Substance Use Topics  . Smoking status: Never Smoker  . Smokeless tobacco: Never Used  . Alcohol use No     Allergies   Patient has no known allergies.   Review of Systems Review of Systems  Constitutional: Negative for chills and fever.  Respiratory: Negative for shortness of breath.   Cardiovascular: Positive for chest pain.  Gastrointestinal: Negative for abdominal pain, nausea and vomiting.  Genitourinary:       No bowel/bladder incontinence  Musculoskeletal: Positive for arthralgias and myalgias. Negative for back pain and neck pain.  Neurological: Positive for syncope. Negative for weakness, numbness and headaches.  Psychiatric/Behavioral: Negative for confusion.  All  other systems reviewed and are negative.    Physical Exam Updated Vital Signs BP 107/61   Pulse 68   Temp 98.2 F (36.8 C) (Oral)   Resp (!) 23   Ht 5\' 10"  (1.778 m)   Wt 126.1 kg (278 lb)   SpO2 95%   BMI 39.89 kg/m   Physical Exam  Constitutional: He is oriented to person, place, and time. He appears well-developed and well-nourished. No distress.  HENT:  Head: Normocephalic and atraumatic.  Right Ear: External ear normal.  Left Ear: External ear normal.  Mouth/Throat: Oropharynx is clear and moist.  No Battle's signs, no raccoon's eyes, no rhinorrhea. No hemotympanum. No tenderness to palpation of the face or skull. No deformity, crepitus, or swelling noted. No trismus or sublingual abnormalities. There are superficial skin abrasions noted to the right forehead and towards the top of the skull. Bleeding is controlled   Eyes: Pupils are equal, round, and reactive to light. Conjunctivae and EOM are normal. Right eye exhibits no discharge. Left eye exhibits no discharge. Right conjunctiva is not injected. Right conjunctiva has no hemorrhage. Left conjunctiva is not injected. Left conjunctiva has no hemorrhage.  no ecchymosis or swelling of the eyelids, no tenderness to  palpation of the periorbital region  Neck: Normal range of motion. Neck supple. No JVD present. No tracheal deviation present.  No midline spine TTP, no paraspinal muscle tenderness, no deformity, crepitus, or step-off noted   Cardiovascular: Normal rate, regular rhythm, normal heart sounds and intact distal pulses.   R upper arm with dialysis fistula with good thrill, 2+ radial and DP/PT pulses bl, negative Homan's bl   Pulmonary/Chest: Effort normal and breath sounds normal. No respiratory distress. He has no wheezes. He has no rales. He exhibits tenderness.  Equal rise and fall of chest. No increased work of breathing. No paradoxical wall motion. Mild left lateral lower chest wall tender to palpation. Large areas of ecchymosis  (~6cm) noted to the chest wall.   Abdominal: Soft. Bowel sounds are normal. He exhibits no distension. There is no tenderness.  Musculoskeletal: Normal range of motion. He exhibits edema and tenderness.  No midline spine TTP, no paraspinal muscle tenderness, no deformity, crepitus, or step-off noted. Moves extremities spontaneously with 5/5 strength of DOE and BLE major muscle groups. There is generalized ecchymosis to the left upper extremity and swelling noted to the dorsum of the distal forearm. There is ecchymosis overlying the deltoid of the left arm. Mild tenderness to palpation of the left clavicle without deformity or crepitus noted. No AC joint tenderness. Normal ROM of joints. No snuffbox tenderness. God grip strength.   Neurological: He is alert and oriented to person, place, and time. No cranial nerve deficit or sensory deficit. He exhibits normal muscle tone.  Fluent speech, no facial droop, sensation intact to soft touch of extremities, normal gait, and patient able to heel walk and toe walk without difficulty. Cranial nerves III through XII tested and intact.   Skin: Skin is warm and dry. Capillary refill takes less than 2 seconds. No erythema.  Multiple  bruises noted to the chest wall, lower extremities, and upper extremities. Superficial skin abrasion noted to the skull as noted in the HENT section. Mild superficial laceration noted to the left medial shin with no visualized subcutaneous tissue. Bleeding is controlled. All wounds visualized to the base in a bloodless field  Psychiatric: He has a normal mood and affect. His behavior is normal.  Nursing note and vitals reviewed.  ED Treatments / Results  Labs (all labs ordered are listed, but only abnormal results are displayed) Labs Reviewed - No data to display  EKG  EKG Interpretation  Date/Time:  Friday July 03 2017 13:49:02 EDT Ventricular Rate:  73 PR Interval:    QRS Duration: 99 QT Interval:  427 QTC Calculation: 471 R Axis:   59 Text Interpretation:  Sinus rhythm No significant change since last tracing Confirmed by Dorie Rank [phone]) on 07/03/2017 4:02:42 PM       Radiology Dg Ribs Unilateral W/chest Left  Result Date: 07/03/2017 CLINICAL DATA:  Left lower axillary rib pain after falling down stairs and a dryer fell on top of him. EXAM: LEFT RIBS AND CHEST - 3+ VIEW COMPARISON:  Chest dated 03/13/2014. FINDINGS: Interval borderline enlarged cardiac silhouette. A right diaphragmatic eventration is again demonstrated. Stable mild linear atelectasis or scarring at the right lung base. Otherwise, clear lungs. Prominent pulmonary vasculature and mildly prominent interstitial markings. No rib fracture or pneumothorax seen. IMPRESSION: 1. No rib fracture seen. 2. Interval pulmonary vascular congestion and borderline cardiomegaly. 3. Mild chronic interstitial lung disease. Electronically Signed   By: Claudie Revering M.D.   On: 07/03/2017 15:53   Dg Forearm Left  Result Date: 07/03/2017 CLINICAL DATA:  Left forearm pain after falling down stairs and a dryer fell on top of him. EXAM: LEFT FOREARM - 2 VIEW COMPARISON:  None. FINDINGS: Distal subcutaneous edema dorsally and radially. No  fracture or dislocation seen. Vascular clips at the level of the elbow. IMPRESSION: No fracture. Electronically Signed   By: Claudie Revering M.D.   On: 07/03/2017 15:50   Ct Head Wo Contrast  Result Date: 07/03/2017 CLINICAL DATA:  [place] down steps. Laceration on forehead. Renal failure. Anticoagulation. EXAM: CT HEAD WITHOUT CONTRAST TECHNIQUE: Contiguous axial images were obtained from the base of the skull through the vertex without intravenous contrast. COMPARISON:  None. FINDINGS: Brain: No evidence of malformation, atrophy, old or acute small or large vessel infarction, mass lesion, hemorrhage, hydrocephalus or extra-axial collection. No evidence of pituitary lesion. Vascular: No vascular calcification.  No hyperdense vessels. Skull: Normal.  No fracture or focal bone lesion. Sinuses/Orbits: Visualized sinuses are clear. No fluid in the middle ears or mastoids. Visualized orbits are normal. Other: None significant IMPRESSION: Normal head CT Electronically Signed   By: Nelson Chimes M.D.   On: 07/03/2017 15:10   Dg Shoulder Left  Result Date: 07/03/2017 CLINICAL DATA:  Left shoulder pain after falling down stairs and a dryer fell on top of him. EXAM: LEFT SHOULDER - 2+ VIEW COMPARISON:  None. FINDINGS: There is no evidence of fracture or dislocation. There is no evidence of arthropathy or other focal bone abnormality. Soft tissues are unremarkable. IMPRESSION: Normal examination. Electronically Signed   By: Claudie Revering M.D.   On: 07/03/2017 15:49    Procedures Procedures (including critical care time)  Medications Ordered in ED Medications  Tdap (BOOSTRIX) injection 0.5 mL (not administered)     Initial Impression / Assessment and Plan / ED Course  I have reviewed the triage vital signs and the nursing notes.  Pertinent labs & imaging results that were available during my care of the patient were reviewed by me and considered in my medical decision making (see chart for details).      Patient presents with left shoulder pain, elbow and forearm pain, and mild left lateral chest wall pain on inspiration only secondary to injury prior to arrival with short period of  loss of consciousness. Vital signs are stable. He was initially hypotensive with EMS, with improvement while in the ED. No focal neurological deficits. CT head without any acute abnormalities, doubt skull fracture, ICH, or SAH. Remaining radiographs show no fracture or dislocation. No evidence of rib fracture, hemothorax, or pneumothorax. No JVD or muffled heart sounds to suggest cardiac tamponade. No concern for intra-abdominal injury. No midline spine tenderness and no red flags signs concerning for cauda equina syndrome. EKG shows sinus rhythm with no significant changes from last. Offered pain medication, patient declines at this time. He is stable for discharge home with RICE therapy. He will follow up with his primary care physician for reevaluation if symptoms persist. Discussed indications for return to the ED. Pt verbalized understanding of and agreement with plan and is safe for discharge home at this time.   Final Clinical Impressions(s) / ED Diagnoses   Final diagnoses:  Fall, initial encounter  Abrasion of face, initial encounter  Acute pain of left shoulder  Left elbow pain  Left forearm pain    New Prescriptions New Prescriptions   No medications on file     Debroah Baller 07/03/17 1629    Dorie Rank, MD 07/04/17 [phone]

## 2017-07-03 NOTE — ED Notes (Signed)
Wedding rings and wallet given to wife

## 2017-07-03 NOTE — ED Notes (Signed)
Ortho paged. 

## 2017-07-03 NOTE — ED Notes (Signed)
Family at bedside.Wife.

## 2017-07-03 NOTE — ED Notes (Signed)
Ice pack given to pt.

## 2017-07-14 ENCOUNTER — Ambulatory Visit (INDEPENDENT_AMBULATORY_CARE_PROVIDER_SITE_OTHER): Payer: 59 | Admitting: Neurology

## 2017-07-14 ENCOUNTER — Encounter: Payer: Self-pay | Admitting: Neurology

## 2017-07-14 VITALS — Ht 70.0 in | Wt 276.0 lb

## 2017-07-14 DIAGNOSIS — G459 Transient cerebral ischemic attack, unspecified: Secondary | ICD-10-CM

## 2017-07-14 DIAGNOSIS — I4891 Unspecified atrial fibrillation: Secondary | ICD-10-CM

## 2017-07-14 DIAGNOSIS — Z8673 Personal history of transient ischemic attack (TIA), and cerebral infarction without residual deficits: Secondary | ICD-10-CM | POA: Insufficient documentation

## 2017-07-14 DIAGNOSIS — G450 Vertebro-basilar artery syndrome: Secondary | ICD-10-CM

## 2017-07-14 HISTORY — DX: Transient cerebral ischemic attack, unspecified: G45.9

## 2017-07-14 NOTE — Progress Notes (Signed)
PATIENT: [name] [name] DOB: Mar 31, 1961  Chief Complaint  Patient presents with  . Dizziness    Orthostatic Vitals:  Lying: 101/59, 57, Sitting: 98/57, 60, Standing: 99/60, 63, Standing x 3 minutes: 101/69, 64.  He is here with his wife, [name] Stabs, due to having intermittent vertigo.  He has not had any dizziness in the last two months.  He has a prescription for meclizine but has not had to use it.  . Nephrology    Jamal Maes, MD - referring MD (on PCP)     HISTORICAL  [name] [name] is a 56 year old right-handed male, seen in refer by his primary care doctor Lorrene Reid, [place],for evaluation of dizziness, initial evaluation was July 14 2017.  I reviewed and summarized the referring note, he has history of hypertension, hyperlipidemia, end-stage renal disease,  He had history of right nephrectomy, has been on home dialysis daily since June 2017, is on kidney transplant list, he also had a history of paroxysmal atrial fibrillation, but has not had recurrent spells for many years, he is on aspirin 81 mg daily.  In June 2018, after nap on his couch, when he get up, he had sudden onset vertigo, difficulty walking, he denies tinnitus,no hearing loss, no dysarthria,severe symptoms last for 2 days, he has to lie still in his bed for 2 days,then symptoms gradually improved, lasted for 2 weeks total.  He now denies focal symptoms,  He suffered a fall on July 03 2017,while moving a piece of heavy furniture, presented to emergency room,I personally reviewed CT head without contrast that was normal.  REVIEW OF SYSTEMS: Full 14 system review of systems performed and notable only for joint pain  ALLERGIES: No Known Allergies  HOME MEDICATIONS: Current Outpatient Prescriptions  Medication Sig Dispense Refill  . aspirin 81 MG chewable tablet Chew 81 mg by mouth daily.    Marland Kitchen atorvastatin (LIPITOR) 10 MG tablet Take 10 mg by mouth every evening.     Marland Kitchen b complex-vitamin c-folic acid  (NEPHRO-VITE) 0.8 MG TABS tablet Take 1 tablet by mouth every evening.    . calcitRIOL (ROCALTROL) 0.25 MCG capsule Take 0.25 mcg by mouth daily.  5  . calcium carbonate (TUMS - DOSED IN MG ELEMENTAL CALCIUM) 500 MG chewable tablet Chew 1 tablet by mouth 3 (three) times daily with meals.    . lidocaine-prilocaine (EMLA) cream Apply 1 application topically daily.  6  . meclizine (ANTIVERT) 25 MG tablet as needed.    . metoprolol tartrate (LOPRESSOR) 25 MG tablet Take 1 tablet (25 mg total) by mouth 2 (two) times daily. (Patient taking differently: Take 25 mg by mouth daily. ) 60 tablet 0   No current facility-administered medications for this visit.     PAST MEDICAL HISTORY: Past Medical History:  Diagnosis Date  . Arthritis   . Cataract    bilateral- right removed, left present  . Chronic diastolic CHF (congestive heart failure) ([place])   . Complication of anesthesia    problem with bladder not waking up after surgery-for right nephrectomy  . Dysrhythmia   . Enlarged kidney    right  . ESRD (end stage renal disease) on dialysis ([place])   . Gout   . Hyperlipidemia   . Hypertension   . PAF (paroxysmal atrial fibrillation) ([facility])    happens about every 6 years-sees nishan  . Renal insufficiency    KIDNEY DISEASE - FOLLOWED BY DR. Lorrene Reid NOTES ON CHART  . Vertigo     PAST SURGICAL  HISTORY: Past Surgical History:  Procedure Laterality Date  . AV FISTULA PLACEMENT     "IT CLOSED UP AND DOES NOT WORK"  . AV FISTULA PLACEMENT Right 10/27/2014   Procedure: ARTERIOVENOUS (AV) FISTULA CREATION;  Surgeon: Conrad Waverly, MD;  Location: [place];  Service: Vascular;  Laterality: Right;  . AV FISTULA PLACEMENT Right 08/22/2015   Procedure: BRACHIOCEPHALIC ARTERIOVENOUS (AV) FISTULA CREATION;  Surgeon: Conrad Biddle, MD;  Location: [place];  Service: Vascular;  Laterality: Right;  . CATARACTS REMOVED Right   . CHOLECYSTECTOMY N/A 07/13/2015   Procedure: LAPAROSCOPIC CHOLECYSTECTOMY WITH INTRAOPERATIVE  CHOLANGIOGRAM;  Surgeon: Johnathan Hausen, MD;  [facility];  Service: General;  Laterality: N/A;  . EYE SURGERY     left cataract surgery-lens implant  . HERNIA REPAIR    . INSERTION OF MESH N/A 06/20/2014   Procedure: INSERTION OF MESH;  Surgeon: Pedro Earls, MD;  [facility];  Service: General;  Laterality: N/A;  . KNEE SURGERY  2001  . LAPAROSCOPIC GASTRIC BANDING N/A 06/20/2014   Procedure: LAPAROSCOPIC GASTRIC BANDING and VENTRAL HERNIA REPAIR WITH MESH;  Surgeon: Pedro Earls, MD;  [facility];  Service: General;  Laterality: N/A;  . TOTAL NEPHRECTOMY Right     FAMILY HISTORY: Family History  Problem Relation Age of Onset  . Hypertension Mother   . Heart disease Mother        before age 75  . Cancer Father        bone  . Cancer Sister        breast  . Cancer Sister        breast  . CAD Unknown   . Colon cancer Neg Hx   . Rectal cancer Neg Hx   . Stomach cancer Neg Hx     SOCIAL HISTORY:  Social History   Social History  . Marital status: Married    Spouse name: N/A  . Number of children: 12  . Years of education: 2   Occupational History  . Retired    Social History Main Topics  . Smoking status: Never Smoker  . Smokeless tobacco: Never Used  . Alcohol use No  . Drug use: No  . Sexual activity: Not on file   Other Topics Concern  . Not on file   Social History Narrative   Lives at home with his wife.   Right-handed.   No caffeine use.     PHYSICAL EXAM   Vitals:   07/14/17 0740  Weight: 276 lb (125.2 kg)  Height: 5\' 10"  (1.778 m)    Not recorded      Body mass index is 39.6 kg/m.  PHYSICAL EXAMNIATION:  Gen: NAD, conversant, well nourised, obese, well groomed                     Cardiovascular: Regular rate rhythm, no peripheral edema, warm, nontender. Eyes: Conjunctivae clear without exudates or hemorrhage Neck: Supple, no carotid bruits. Pulmonary: Clear to auscultation bilaterally   NEUROLOGICAL  EXAM:  MENTAL STATUS: Speech:    Speech is normal; fluent and spontaneous with normal comprehension.  Cognition:     Orientation to time, place and person     Normal recent and remote memory     Normal Attention span and concentration     Normal Language, naming, repeating,spontaneous speech     Fund of knowledge   CRANIAL NERVES: CN II: Visual fields are full to confrontation. Fundoscopic exam is normal  with sharp discs and no vascular changes. Pupils are round equal and briskly reactive to light. CN III, IV, VI: extraocular movement are normal. No ptosis. CN V: Facial sensation is intact to pinprick in all 3 divisions bilaterally. Corneal responses are intact.  CN VII: Face is symmetric with normal eye closure and smile. CN VIII: Hearing is normal to rubbing fingers CN IX, X: Palate elevates symmetrically. Phonation is normal. CN XI: Head turning and shoulder shrug are intact CN XII: Tongue is midline with normal movements and no atrophy.  MOTOR: There is no pronator drift of out-stretched arms. Muscle bulk and tone are normal. Muscle strength is normal.  REFLEXES: Reflexes are 2+ and symmetric at the biceps, triceps, knees, and ankles. Plantar responses are flexor.  SENSORY: Intact to light touch, pinprick, positional sensation and vibratory sensation are intact in fingers and toes.  COORDINATION: Rapid alternating movements and fine finger movements are intact. There is no dysmetria on finger-to-nose and heel-knee-shin.    GAIT/STANCE: Posture is normal. Gait is steady with normal steps, base, arm swing, and turning. Heel and toe walking are normal. Tandem gait is normal.  Romberg is absent.   DIAGNOSTIC DATA (LABS, IMAGING, TESTING) - I reviewed patient records, labs, notes, testing and imaging myself where available.   ASSESSMENT AND PLAN  ELDRA WORD is a 56 y.o. male   Sudden onset vertigo in June 2018  differentiation diagnosis include posterior  circulation TIA  He has multiple vascular risk factors, hypertension, hyperlipidemia, obesity, end-stage renal disease, on hemodialysis  Keep aspirin 81 mg daily  Complete evaluation with MRI of the brain without contrast, ultrasound of carotid artery   Marcial Pacas, M.D. Ph.D.  [facility] [address] [place], [place] [address] Ph: [phone] Fax: [phone]  CC: Jamal Maes, MD

## 2017-07-15 ENCOUNTER — Other Ambulatory Visit: Payer: Self-pay | Admitting: *Deleted

## 2017-07-15 DIAGNOSIS — G450 Vertebro-basilar artery syndrome: Secondary | ICD-10-CM

## 2017-07-15 DIAGNOSIS — I4891 Unspecified atrial fibrillation: Secondary | ICD-10-CM

## 2017-07-22 ENCOUNTER — Telehealth: Payer: Self-pay | Admitting: Neurology

## 2017-07-22 ENCOUNTER — Ambulatory Visit: Payer: 59

## 2017-07-22 MED ORDER — ALPRAZOLAM 1 MG PO TABS: ORAL_TABLET | ORAL | 0 refills | Status: DC

## 2017-07-22 NOTE — Telephone Encounter (Signed)
Patient was scheduled to have his MRI today at the [facility] mobile unit. But he was unable to do it because he was claustrophic. He wants an open MRI and something to help with his nerves. I faxed the order to [facility] for the open mri.

## 2017-07-22 NOTE — Telephone Encounter (Signed)
Dr. Krista Blue has provided alprazolam 1mg  with instructions per MRI protocol.  Prescription sent to pharmacy and patient made aware of new medication.

## 2017-08-04 ENCOUNTER — Telehealth: Payer: Self-pay | Admitting: Neurology

## 2017-08-04 NOTE — Telephone Encounter (Signed)
Patients wife [name] Stabs (listed on DPR) called office to see if MRI results have been completed.  Please call

## 2017-08-05 NOTE — Telephone Encounter (Signed)
Returned call to patient's wife on HIPAA - she is aware of results and verbalized understanding.

## 2017-08-05 NOTE — Telephone Encounter (Signed)
Please call patient, I have personally reviewed MRI of the brain from the [facility] dated July 28 2017, there was no significant abnormality noted.

## 2017-10-19 ENCOUNTER — Ambulatory Visit: Payer: 59 | Admitting: Neurology

## 2017-11-27 ENCOUNTER — Encounter: Payer: Self-pay | Admitting: Gastroenterology

## 2017-12-22 ENCOUNTER — Ambulatory Visit: Payer: 59 | Admitting: Family Medicine

## 2018-05-07 DIAGNOSIS — R9439 Abnormal result of other cardiovascular function study: Secondary | ICD-10-CM

## 2018-05-07 HISTORY — DX: Abnormal result of other cardiovascular function study: R94.39

## 2018-06-09 ENCOUNTER — Telehealth: Payer: Self-pay | Admitting: *Deleted

## 2018-06-09 NOTE — Telephone Encounter (Signed)
    [facility] Pre-operative Risk Assessment    Request for surgical clearance:  1. What type of surgery is being performed? Tooth extracrion   2. When is this surgery scheduled? TBD  3. What type of clearance is required (medical clearance vs. Pharmacy clearance to hold med vs. Both)? medical  4. Are there any medications that need to be held prior to surgery and how long? NA  5. Practice name and name of physician performing surgery? Dr  Berniece Pap   6. What is your office phone number [phone]     7.   What is your office fax number [phone]  8.   Anesthesia type (None, local, MAC, general) ? Local and n20   Epic Tribbett M 06/09/2018, 4:10 PM  _________________________________________________________________   (provider comments below)

## 2018-06-10 NOTE — Telephone Encounter (Signed)
Called patient to schedule appointment. Spoke with patient's wife, [name] Stabs, who states patient is currently undergoing transplant work up at [facility] and just had a heart cath, which was normal. I advised that since patient has not been seen in our office for greater than 1 year and was seen recently at [facility], that she should advise Dr. Hassan Rowan office to forward clearance to Dr. Ermalene Searing. Wife verbalized agreement and thanked me for the call.

## 2018-06-10 NOTE — Telephone Encounter (Signed)
   Disregard previous entry  Primary Cardiologist:Peter Johnsie Cancel, MD  Chart reviewed as part of pre-operative protocol coverage. Because of WYMON SWANEY past medical history and time since last visit, he/she will require a follow-up visit in order to better assess preoperative cardiovascular risk.  Pre-op covering staff: - Please schedule appointment and call patient to inform them. - Please contact requesting surgeon's office via preferred method (i.e, phone, fax) to inform them of need for appointment prior to surgery.  Daune Perch, NP  06/10/2018, 3:54 PM

## 2018-06-10 NOTE — Telephone Encounter (Signed)
   Primary Cardiologist:Peter Johnsie Cancel, MD  Chart reviewed as part of pre-operative protocol coverage. Because of [name] [name] past medical history and time since last visit a year and a half ago we cannot provide specific clearance for procedures. He has also been followed at [facility] as part of the transplant workup with a normal stress test in 01/2017.   If he is only having 1-2 teeth removed we usually recommend not to stop aspirin.   If more teeth are being removed and there is need to stop aspirin or more involved procedure requiring cardiac clearance, please contact us as he will need an appointment in our office.    Daune Perch, NP  06/10/2018, 3:46 PM

## 2018-09-29 IMAGING — DX DG SHOULDER 2+V*L*
3 series · 3 of 3 positions shown · non-contrast
Comparison: None.

CLINICAL DATA: Left shoulder pain after falling down stairs and a
dryer fell on top of him.

EXAM:
LEFT SHOULDER - 2+ VIEW

[shoulder grashey]
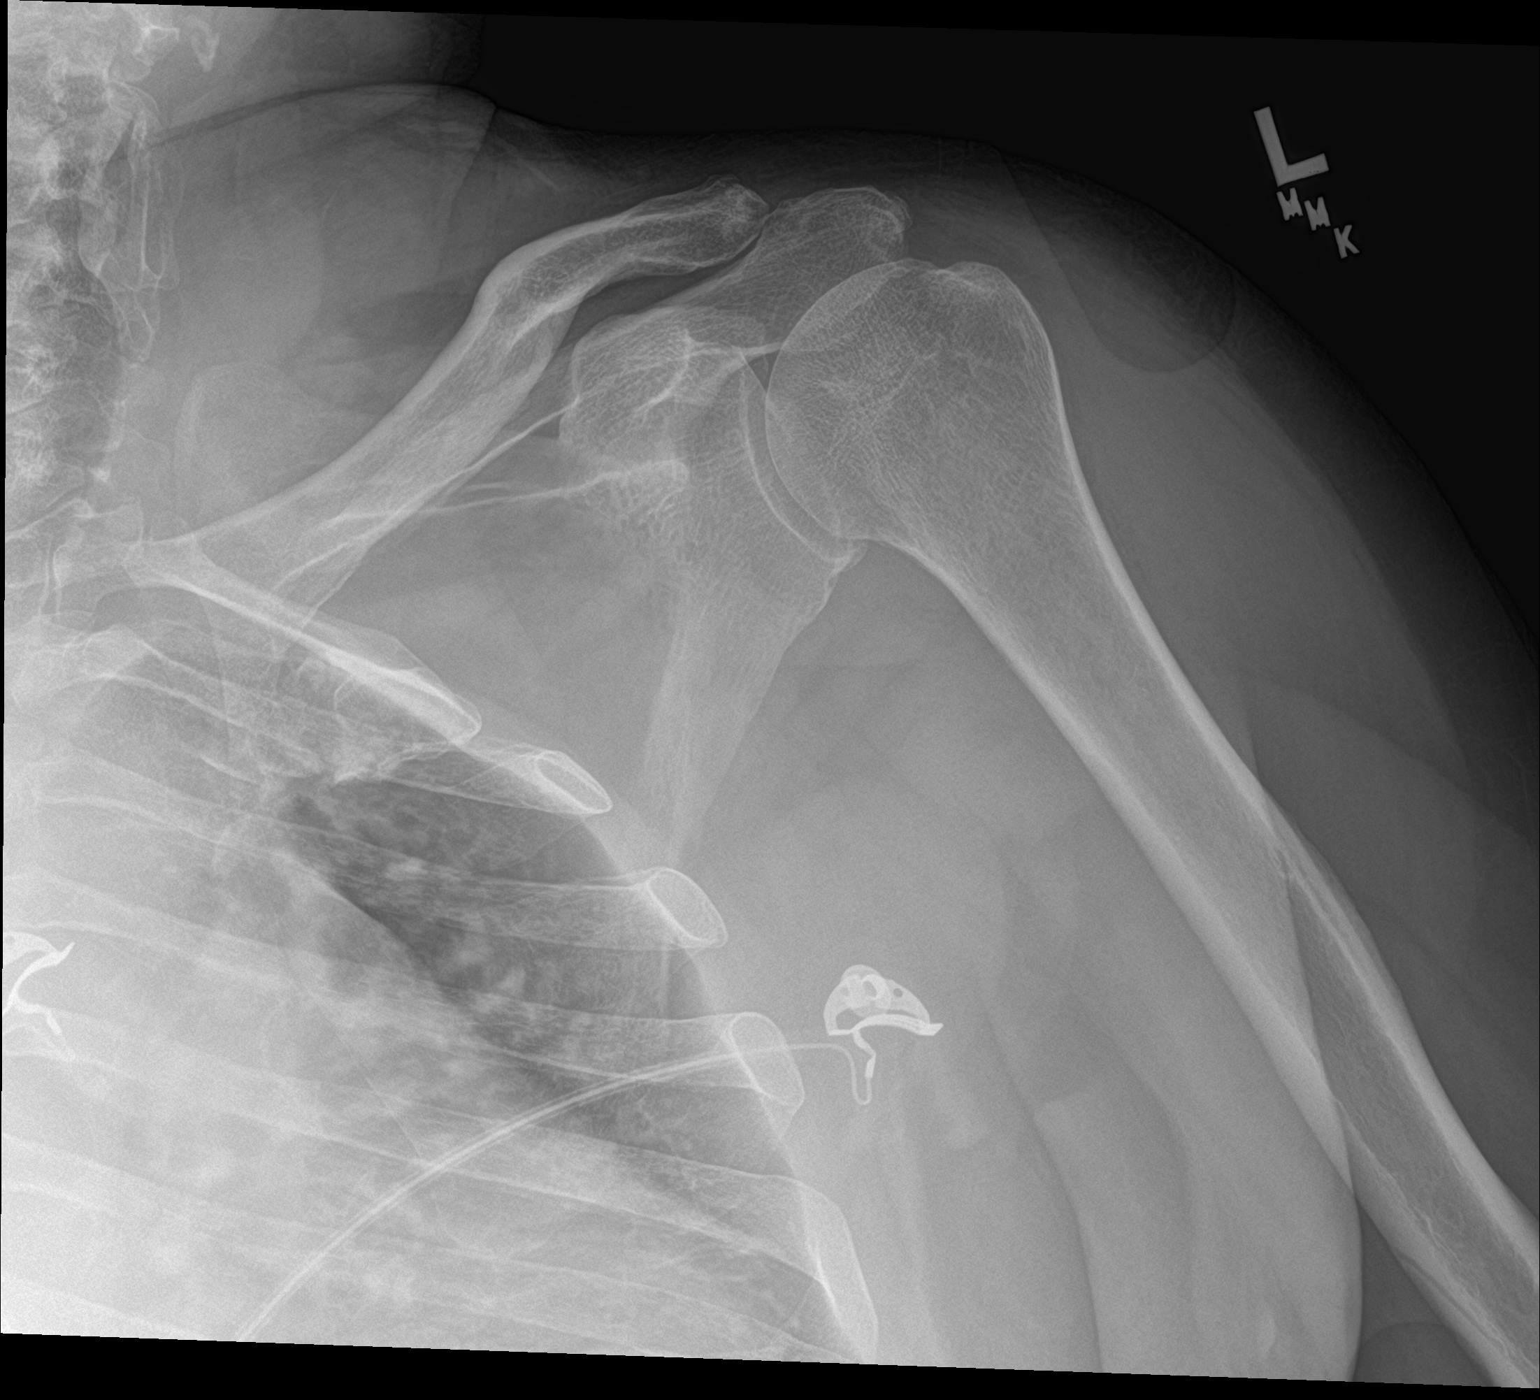

[shoulder y view]
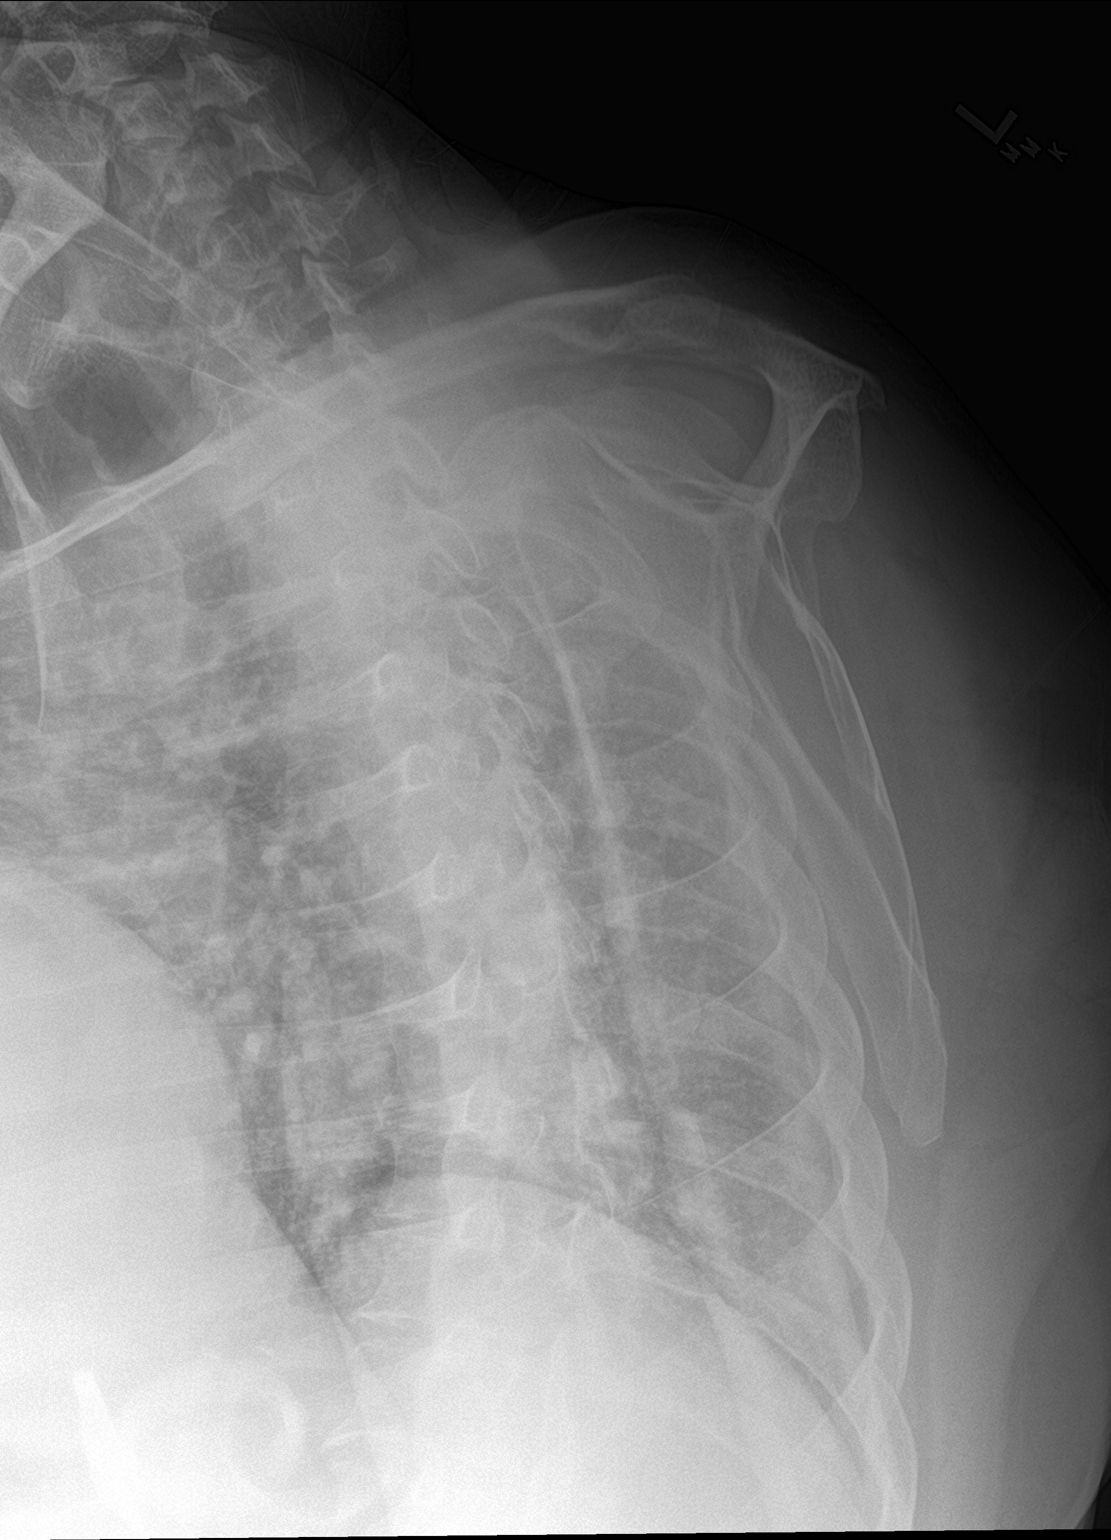

[shoulder ap neutral]
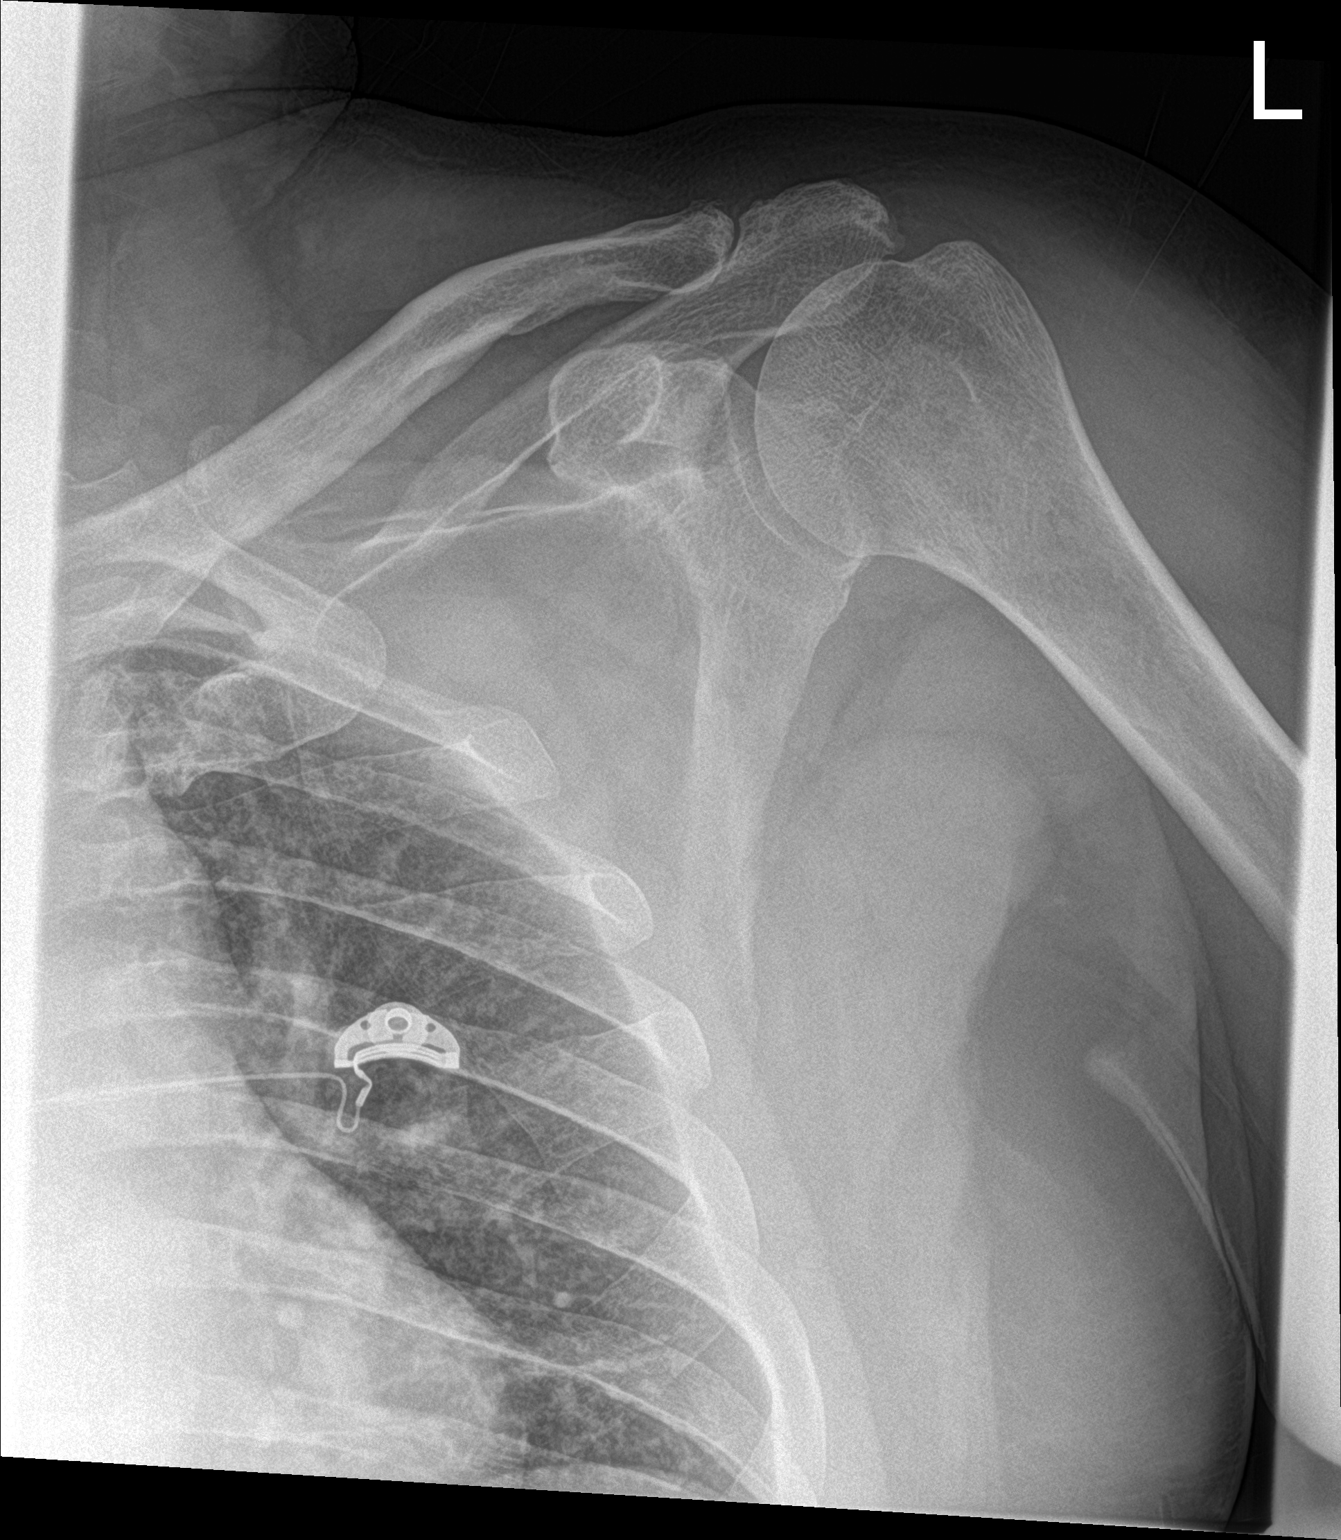

[3 of 3 positions shown; findings below may reference images not displayed]

FINDINGS: There is no evidence of fracture or dislocation. There is no
evidence of arthropathy or other focal bone abnormality. Soft
tissues are unremarkable.
IMPRESSION: Normal examination.

## 2018-09-29 IMAGING — CT CT HEAD W/O CM
3 series · 16 of 47 positions shown, 19 images · non-contrast
Comparison: None.

CLINICAL DATA: Fell down steps. Laceration on forehead. Renal
failure. Anticoagulation.

EXAM:
CT HEAD WITHOUT CONTRAST
TECHNIQUE: Contiguous axial images were obtained from the base of the skull
through the vertex without intravenous contrast.

[Series 3: head 5.0 h30s · axial · 0.47mm/px · z∈[-62,+83]mm · 10 of 35 slices shown, 13 images]
[im 3/35  brain]
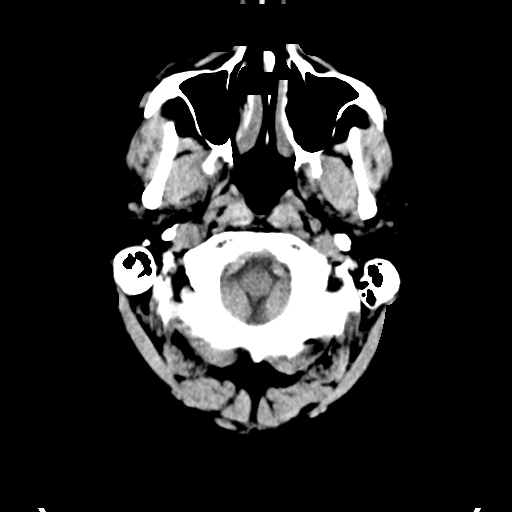
[im 3/35  bone]
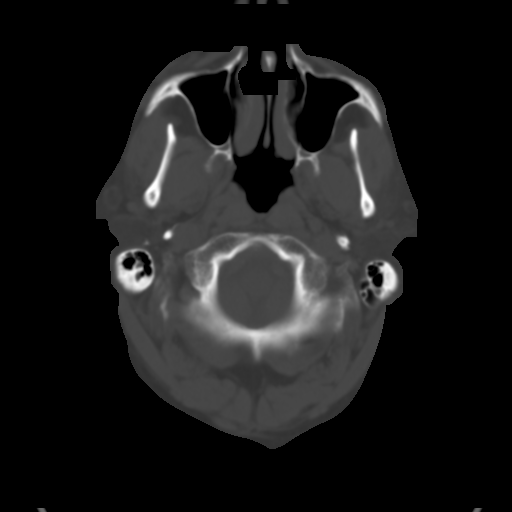
[im 6/35  brain]
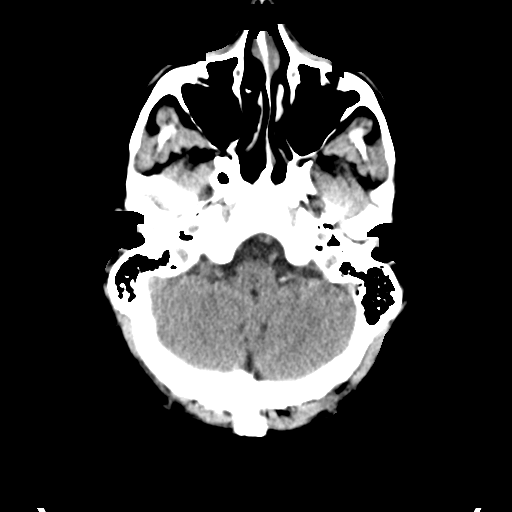
[im 10/35  brain]
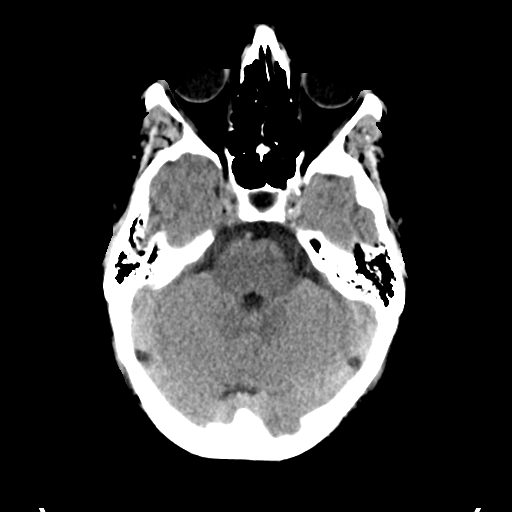
[im 12/35  brain]
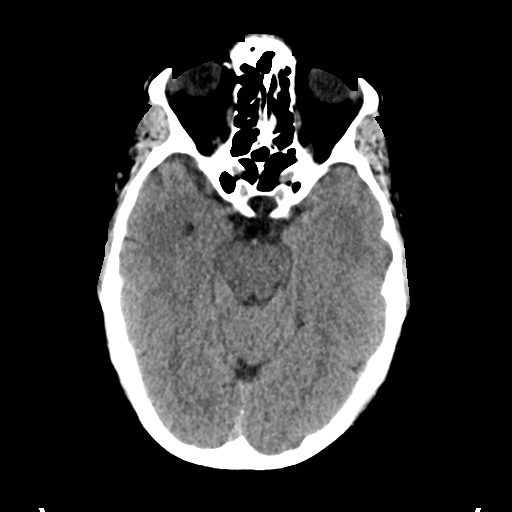
[im 16/35  brain]
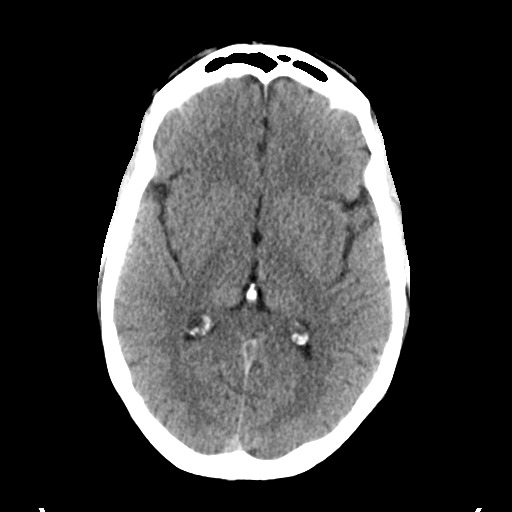
[im 16/35  bone]
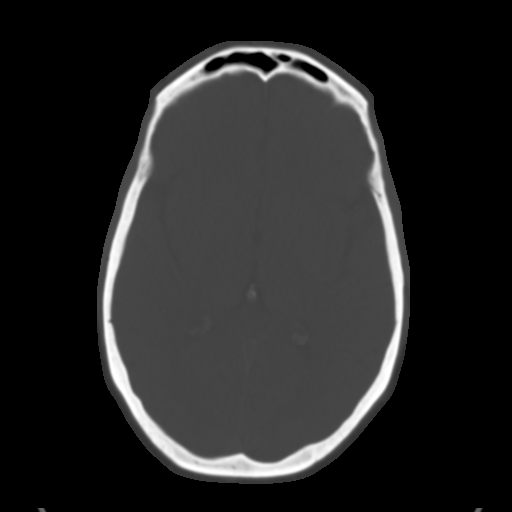
[im 19/35  brain]
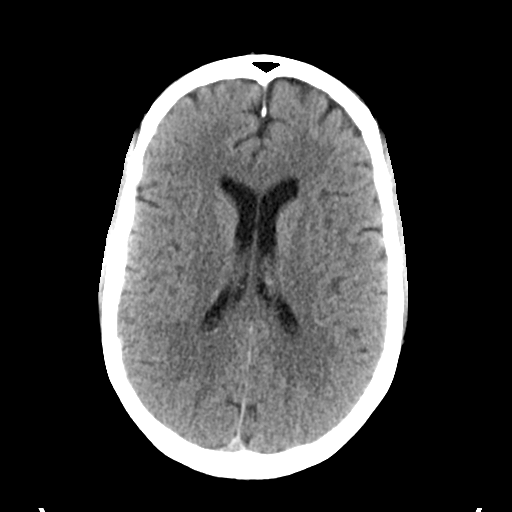
[im 23/35  brain]
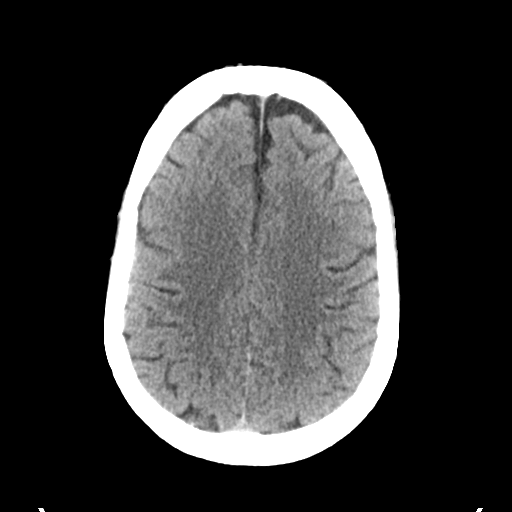
[im 26/35  brain]
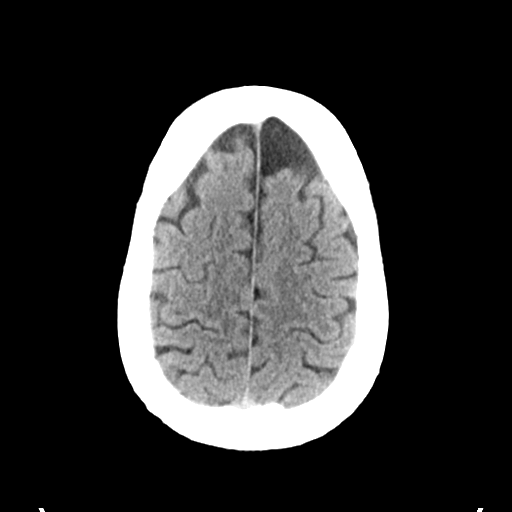
[im 29/35  brain]
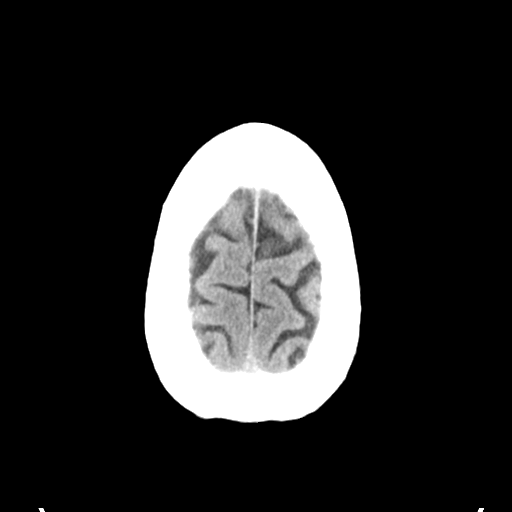
[im 29/35  bone]
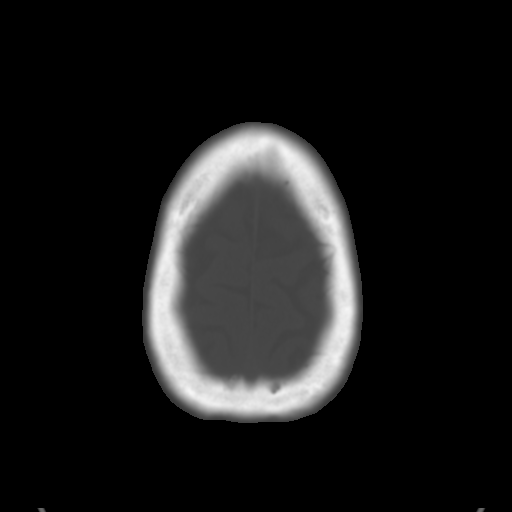
[im 32/35  brain]
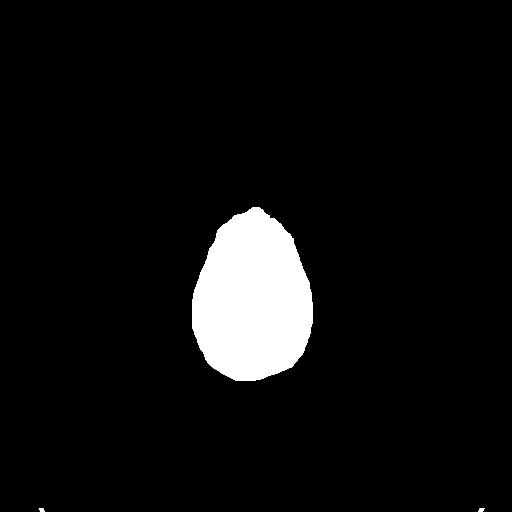

[Series 5: head 3.0 mpr cor · coronal · 0.34mm/px · 3 of 75 slices shown]
[im 25/75  brain]
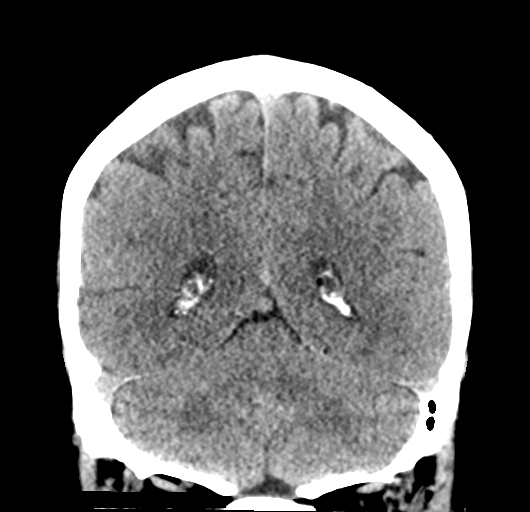
[im 33/75  brain]
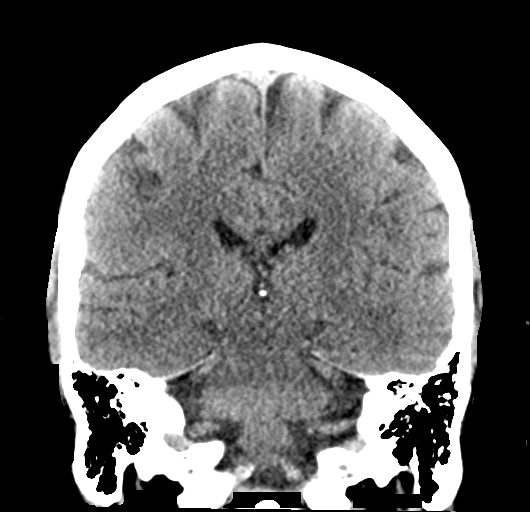
[im 42/75  brain]
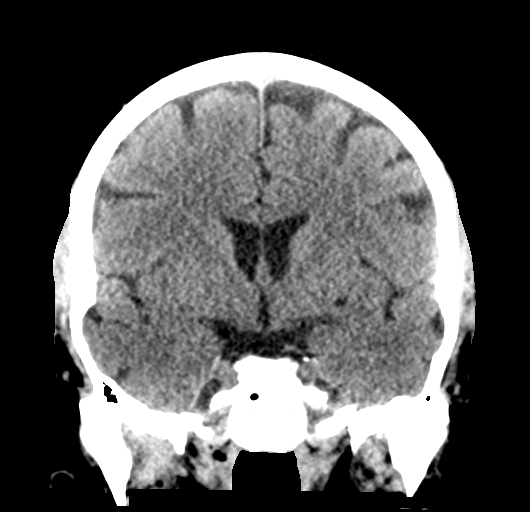

[Series 6: head 3.0 mpr sag · sagittal · 0.34mm/px · 3 of 61 slices shown]
[im 21/61  brain]
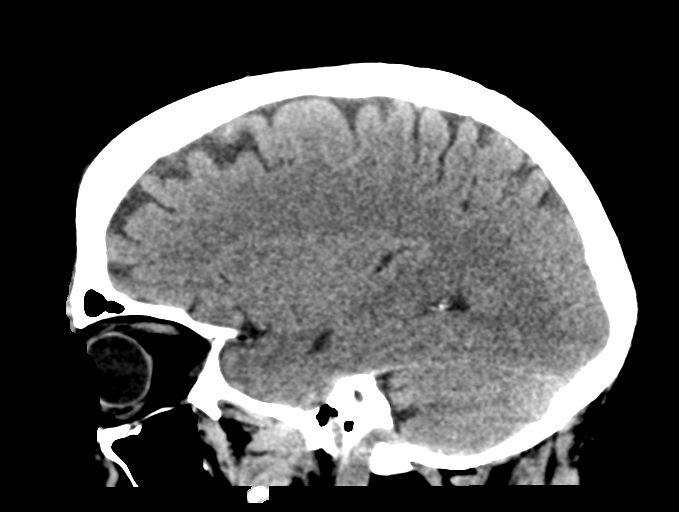
[im 31/61  brain]
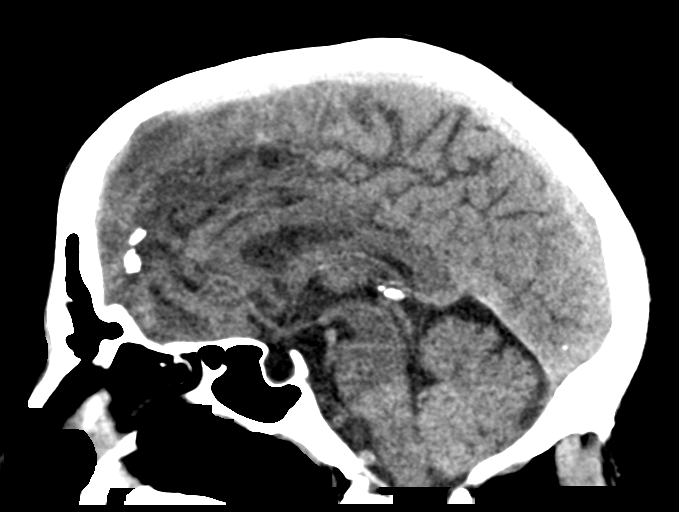
[im 41/61  brain]
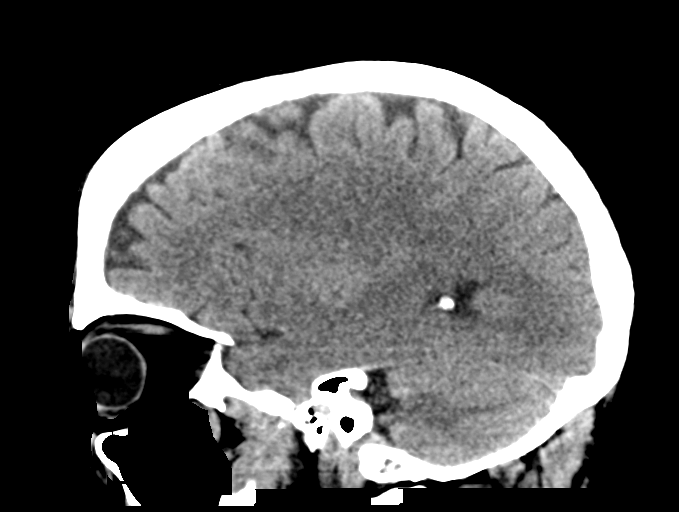

[16 of 47 positions shown; findings below may reference images not displayed]

FINDINGS: Brain: No evidence of malformation, atrophy, old or acute small or
large vessel infarction, mass lesion, hemorrhage, hydrocephalus or
extra-axial collection. No evidence of pituitary lesion.

Vascular: No vascular calcification.  No hyperdense vessels.

Skull: Normal.  No fracture or focal bone lesion.

Sinuses/Orbits: Visualized sinuses are clear. No fluid in the middle
ears or mastoids. Visualized orbits are normal.

Other: None significant
IMPRESSION: Normal head CT

## 2018-09-29 IMAGING — DX DG FOREARM 2V*L*
3 series · 3 of 3 positions shown · non-contrast
Comparison: None.

CLINICAL DATA: Left forearm pain after falling down stairs and a
dryer fell on top of him.

EXAM:
LEFT FOREARM - 2 VIEW

[forearm ap]
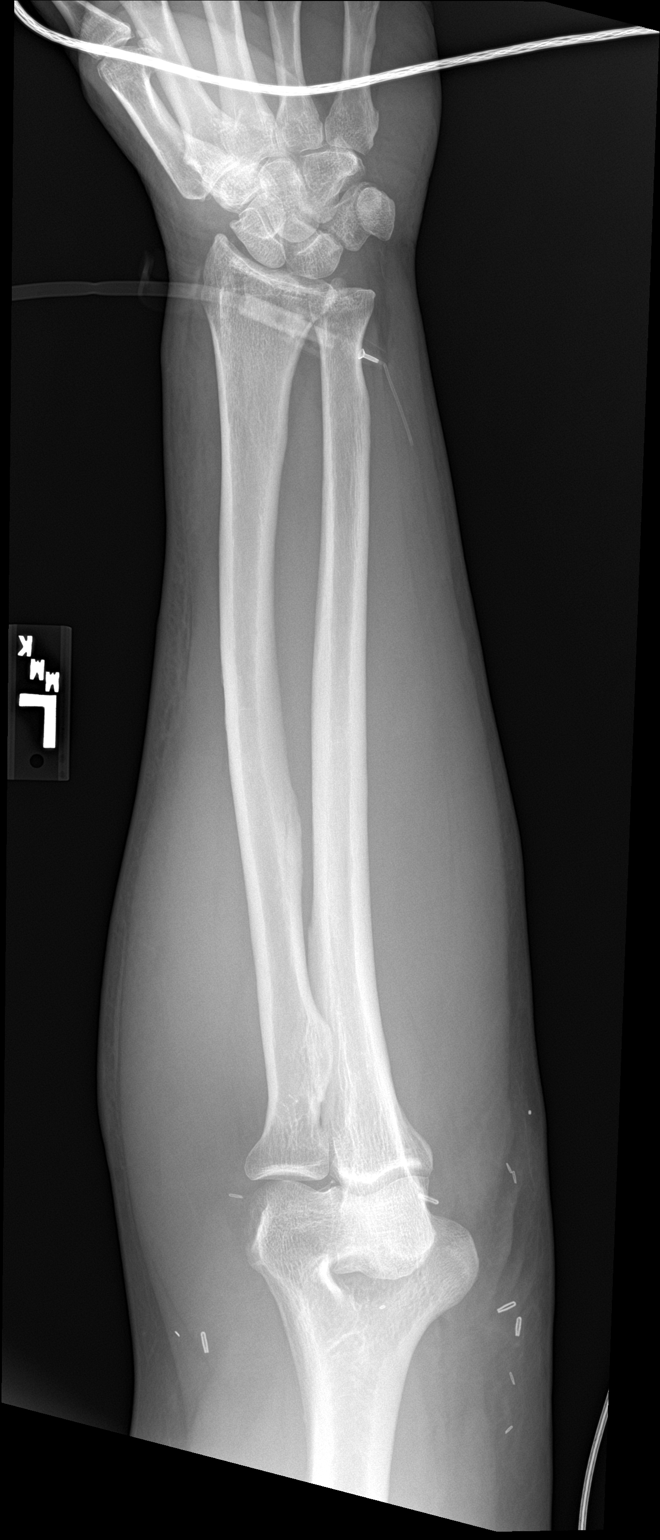

[forearm lat (1 of 2)]
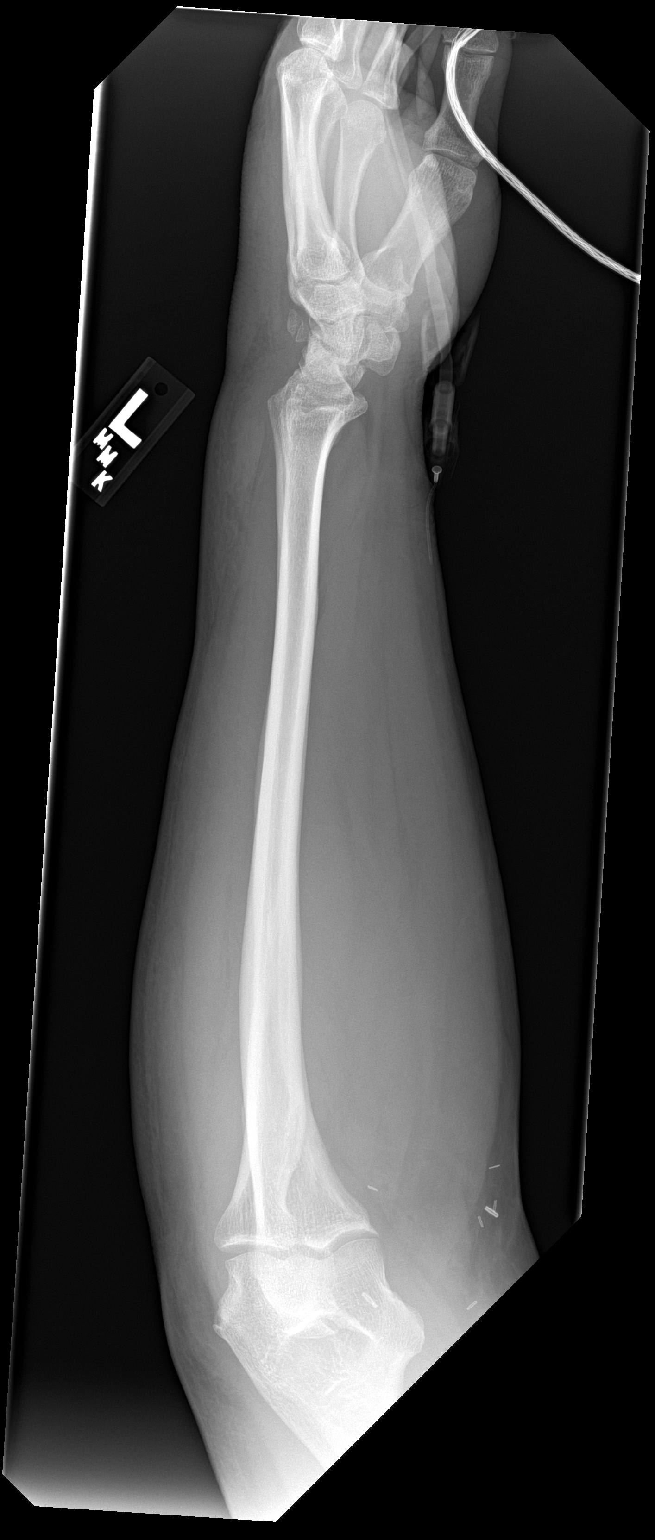

[forearm lat (2 of 2)]
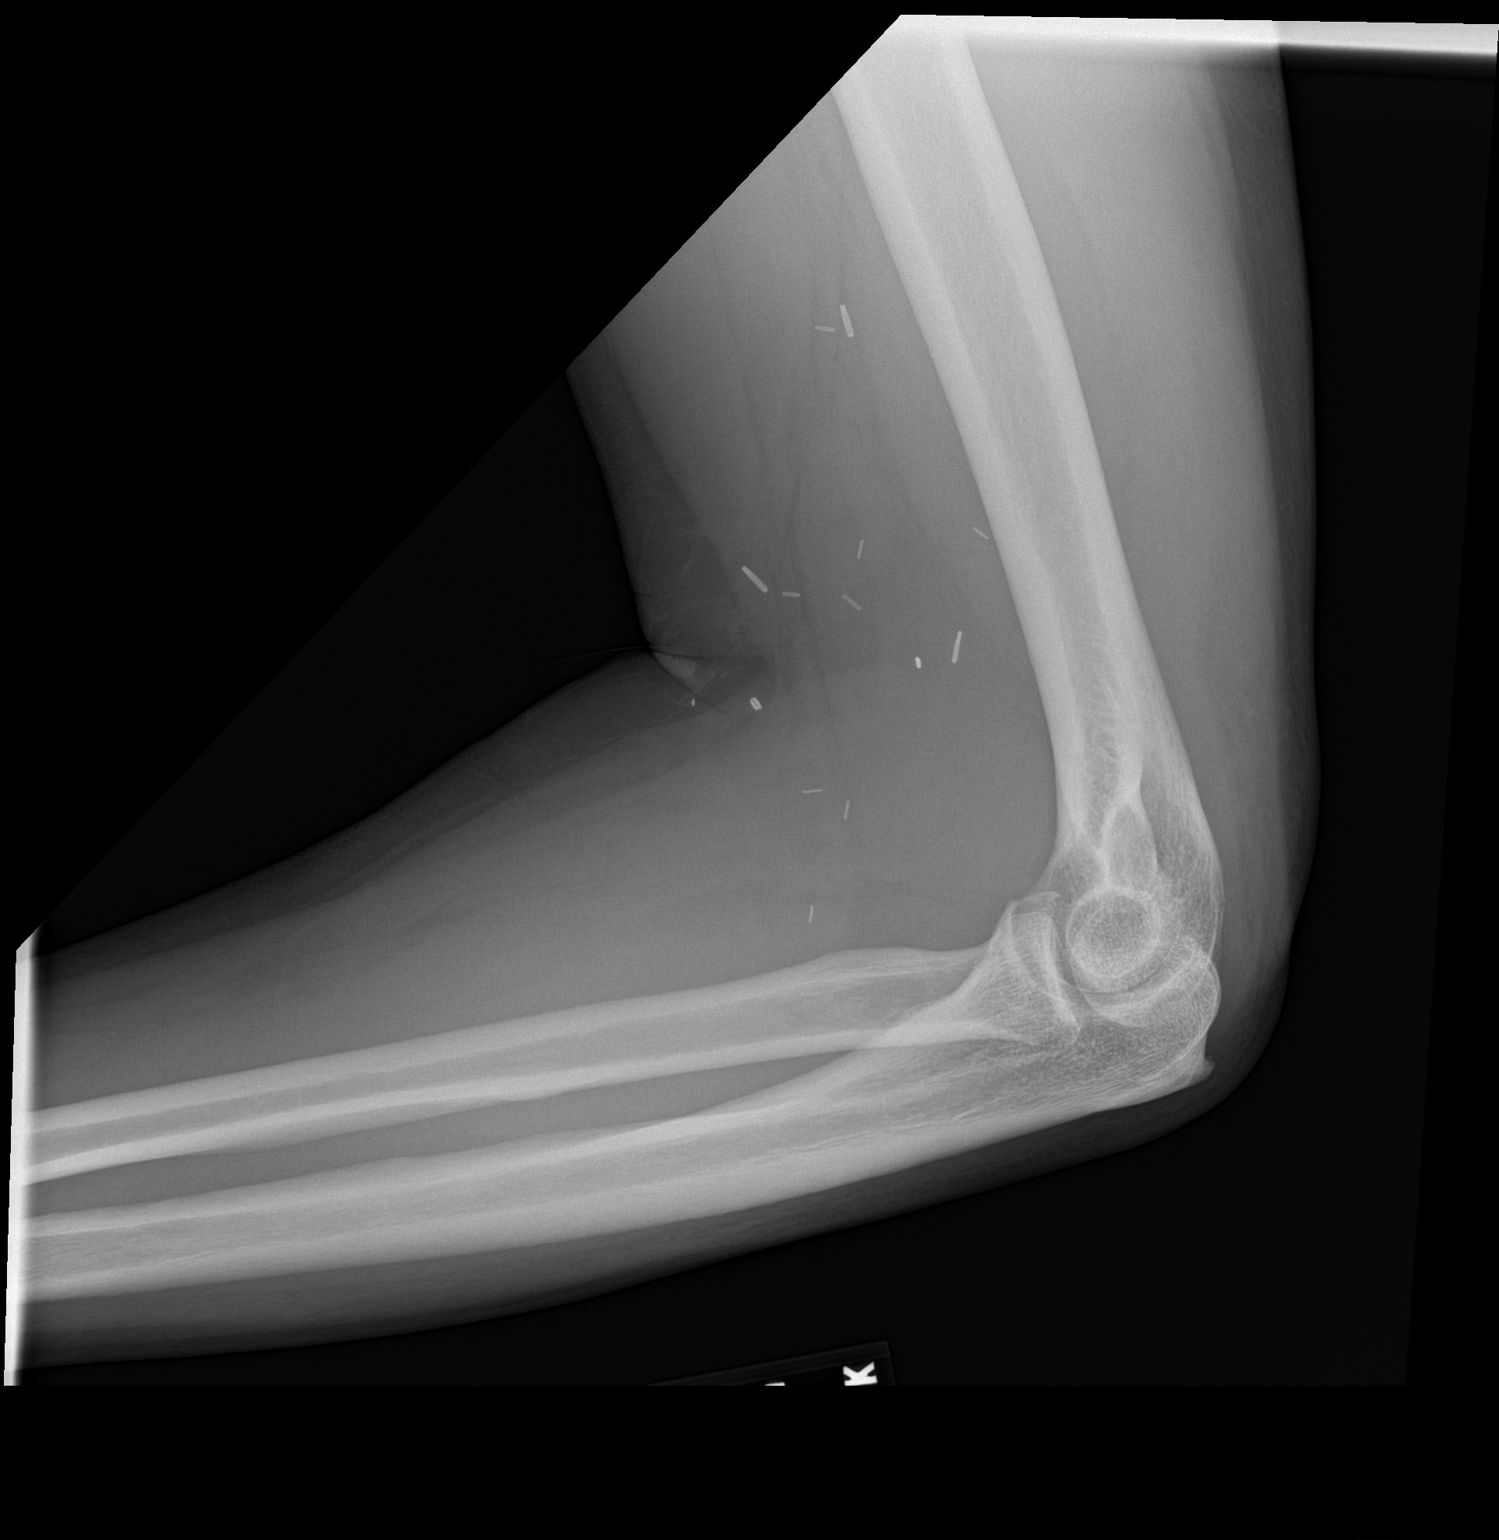

[3 of 3 positions shown; findings below may reference images not displayed]

FINDINGS: Distal subcutaneous edema dorsally and radially. No fracture or
dislocation seen. Vascular clips at the level of the elbow.
IMPRESSION: No fracture.

## 2018-09-29 IMAGING — DX DG RIBS W/ CHEST 3+V*L*
4 series · 4 of 4 positions shown · non-contrast
Comparison: Chest dated 03/13/2014.

CLINICAL DATA: Left lower axillary rib pain after falling down
stairs and a dryer fell on top of him.

EXAM:
LEFT RIBS AND CHEST - 3+ VIEW

[chest ap]
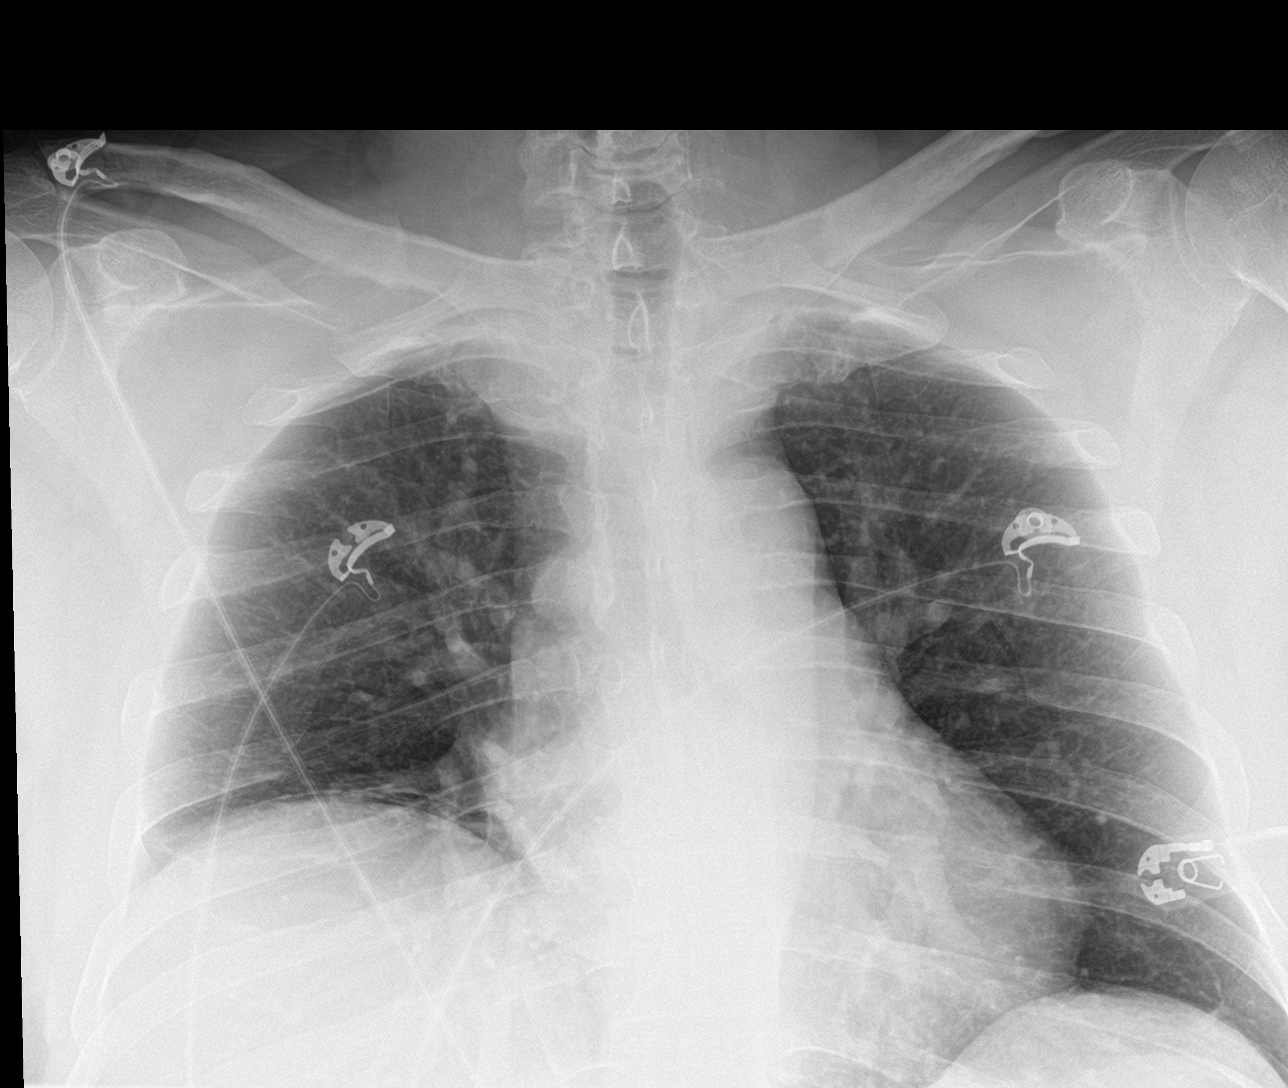

[rib ap]
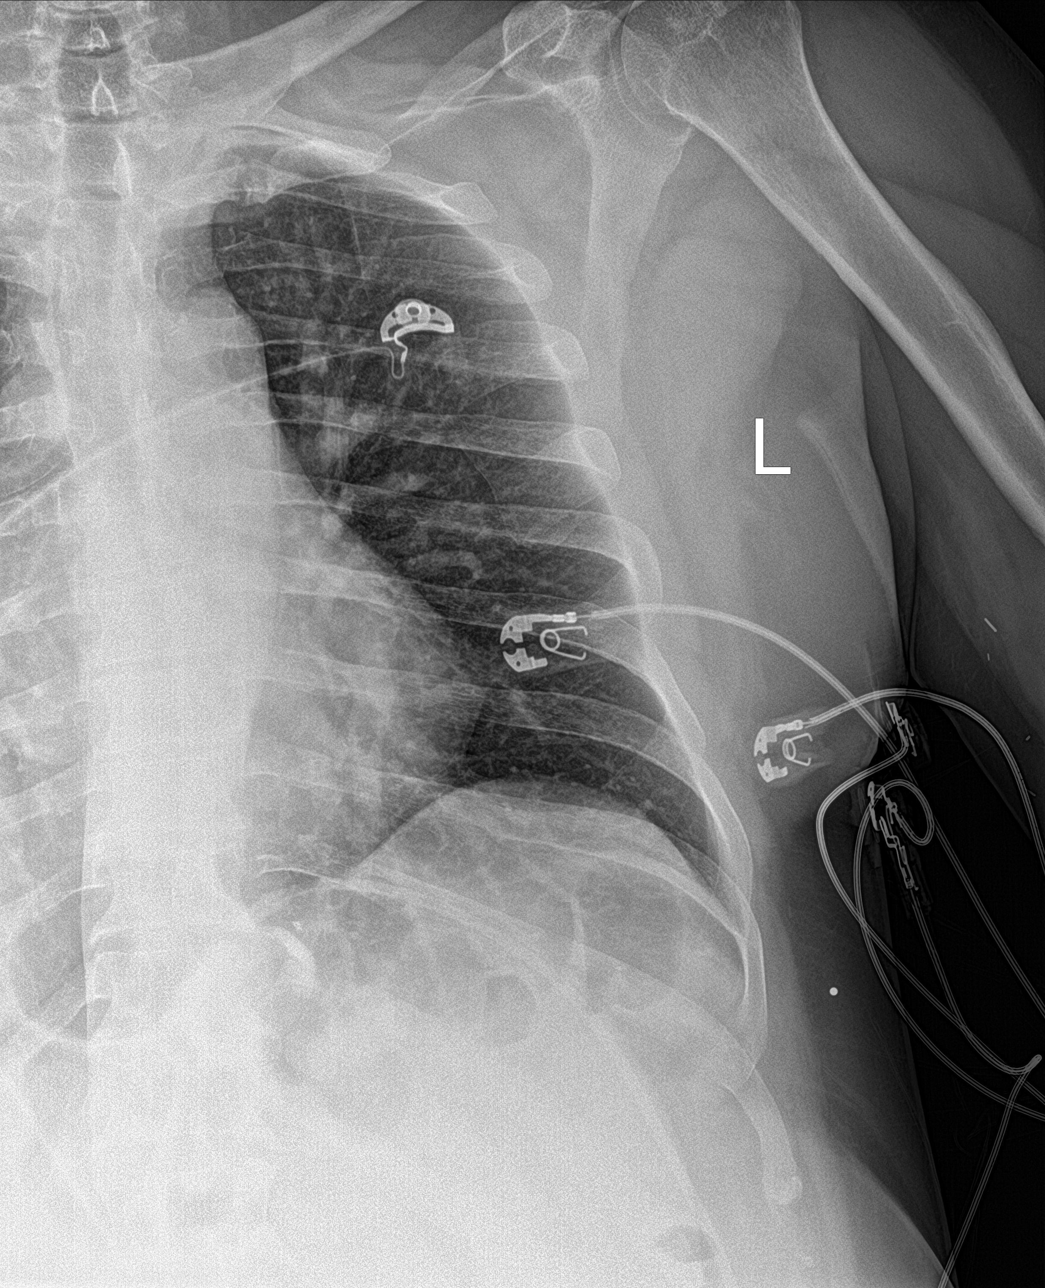

[rib ap obl (1 of 2)]
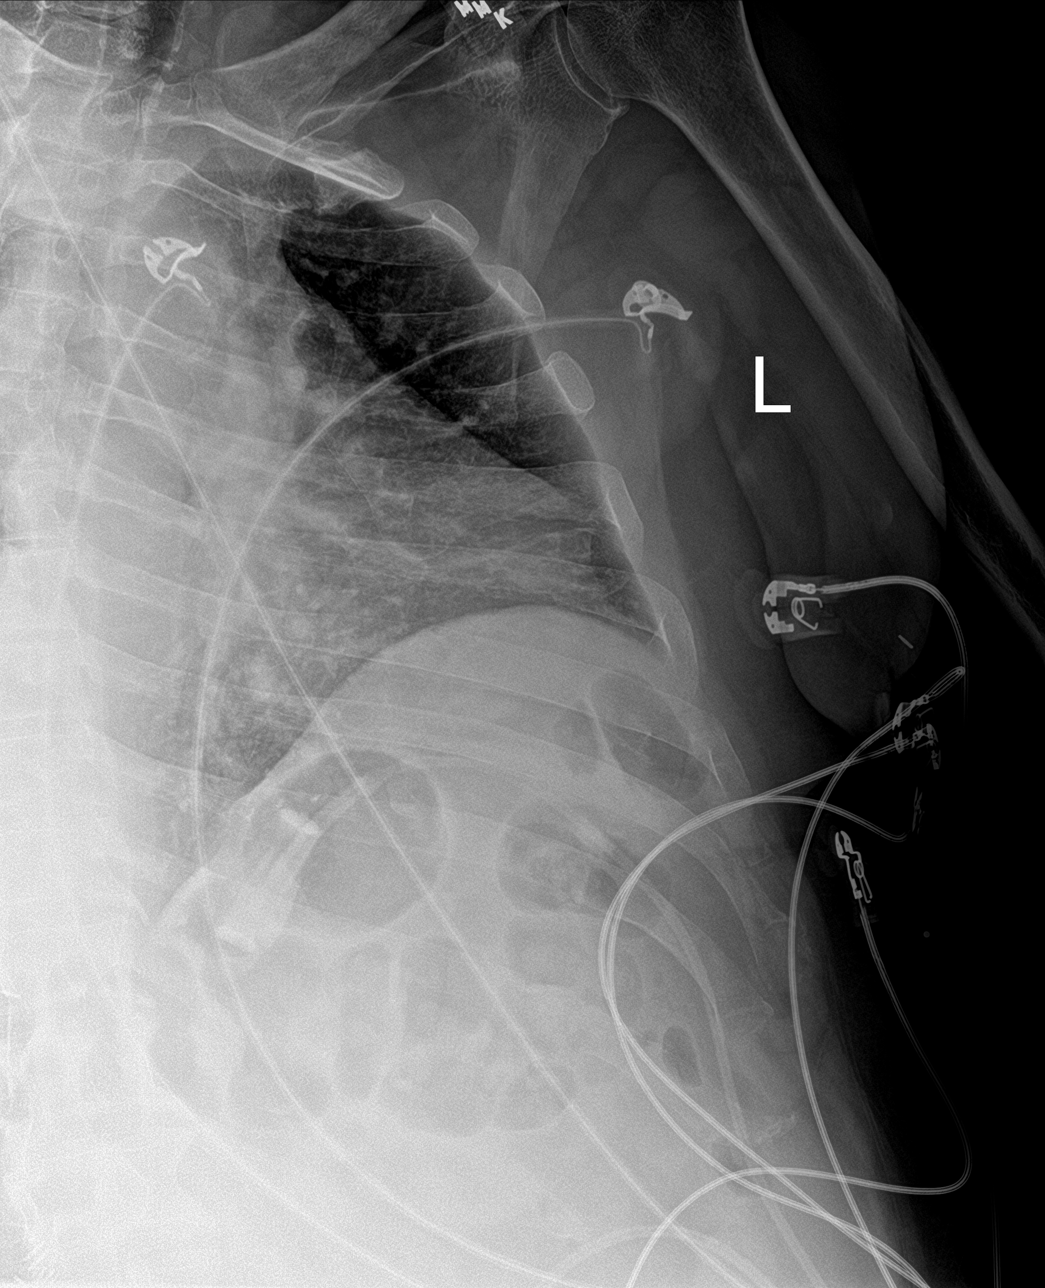

[rib ap obl (2 of 2)]
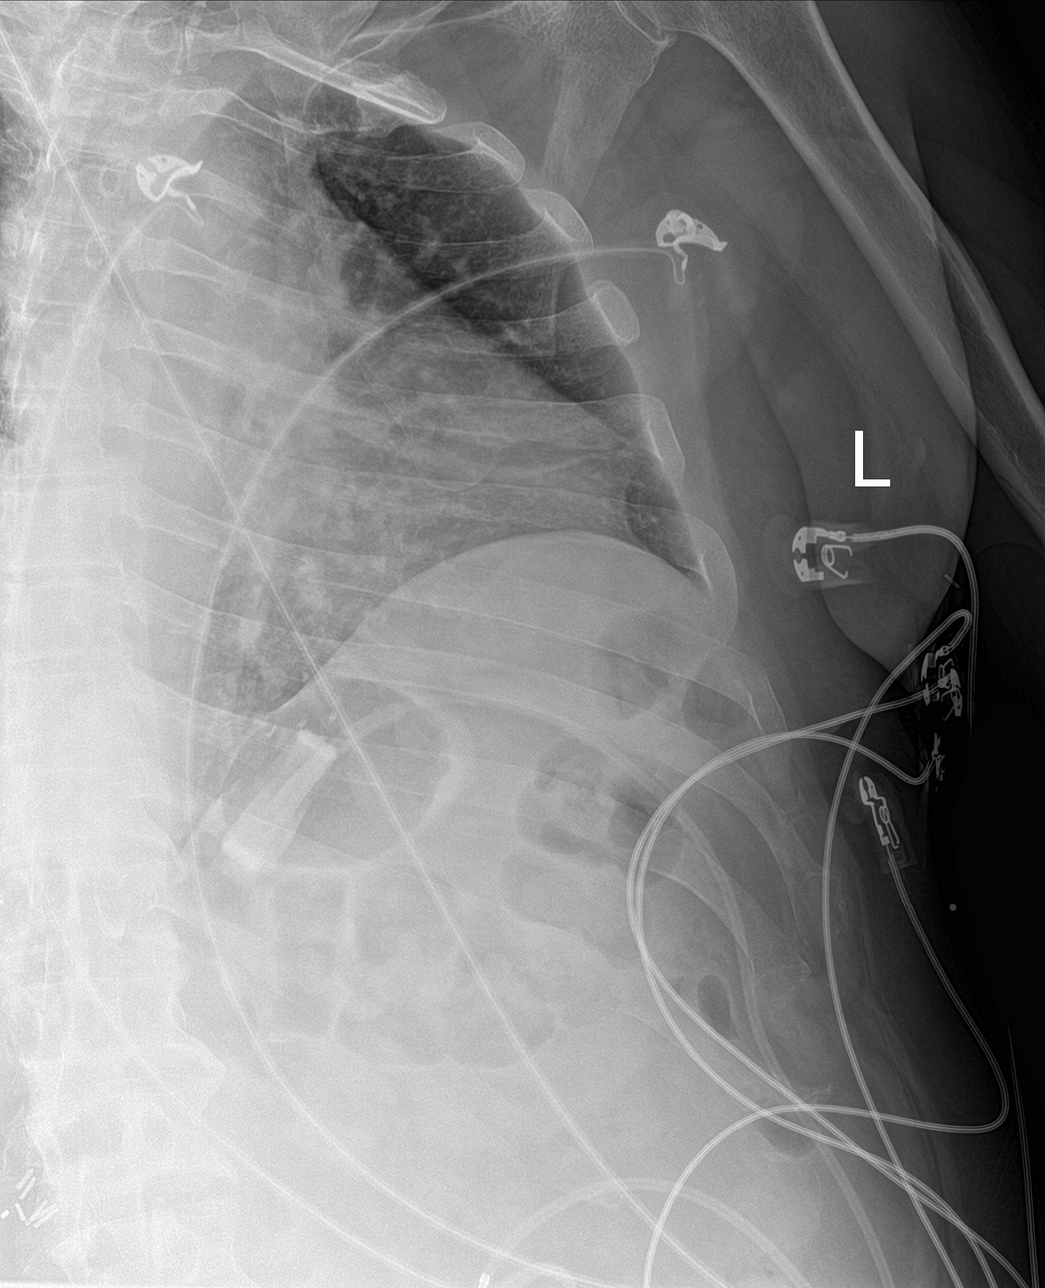

[4 of 4 positions shown; findings below may reference images not displayed]

FINDINGS: Interval borderline enlarged cardiac silhouette. A right
diaphragmatic eventration is again demonstrated. Stable mild linear
atelectasis or scarring at the right lung base. Otherwise, clear
lungs. Prominent pulmonary vasculature and mildly prominent
interstitial markings. No rib fracture or pneumothorax seen.
IMPRESSION: 1. No rib fracture seen.
2. Interval pulmonary vascular congestion and borderline
cardiomegaly.
3. Mild chronic interstitial lung disease.

## 2018-12-24 DIAGNOSIS — I151 Hypertension secondary to other renal disorders: Secondary | ICD-10-CM

## 2018-12-24 HISTORY — DX: Hypertension secondary to other renal disorders: I15.1

## 2019-01-17 DIAGNOSIS — Z940 Kidney transplant status: Secondary | ICD-10-CM

## 2019-01-17 DIAGNOSIS — D849 Immunodeficiency, unspecified: Secondary | ICD-10-CM

## 2019-01-17 HISTORY — DX: Immunodeficiency, unspecified: D84.9

## 2019-01-17 HISTORY — DX: Kidney transplant status: Z94.0

## 2019-01-25 DIAGNOSIS — Z79899 Other long term (current) drug therapy: Secondary | ICD-10-CM

## 2019-01-25 HISTORY — DX: Other long term (current) drug therapy: Z79.899

## 2020-01-26 DIAGNOSIS — R079 Chest pain, unspecified: Secondary | ICD-10-CM | POA: Insufficient documentation

## 2020-01-26 HISTORY — DX: Chest pain, unspecified: R07.9

## 2020-01-30 DIAGNOSIS — Z8739 Personal history of other diseases of the musculoskeletal system and connective tissue: Secondary | ICD-10-CM

## 2020-01-30 DIAGNOSIS — R17 Unspecified jaundice: Secondary | ICD-10-CM | POA: Insufficient documentation

## 2020-01-30 DIAGNOSIS — N1832 Chronic kidney disease, stage 3b: Secondary | ICD-10-CM | POA: Insufficient documentation

## 2020-01-30 HISTORY — DX: Hypomagnesemia: E83.42

## 2020-01-30 HISTORY — DX: Personal history of other diseases of the musculoskeletal system and connective tissue: Z87.39

## 2020-01-30 HISTORY — DX: Unspecified jaundice: R17

## 2020-01-30 HISTORY — DX: Chronic kidney disease, stage 3b: N18.32

## 2020-03-01 DIAGNOSIS — M5412 Radiculopathy, cervical region: Secondary | ICD-10-CM | POA: Insufficient documentation

## 2020-03-01 HISTORY — DX: Radiculopathy, cervical region: M54.12

## 2020-05-01 DIAGNOSIS — L738 Other specified follicular disorders: Secondary | ICD-10-CM | POA: Insufficient documentation

## 2020-05-01 DIAGNOSIS — D229 Melanocytic nevi, unspecified: Secondary | ICD-10-CM | POA: Insufficient documentation

## 2020-05-01 DIAGNOSIS — L821 Other seborrheic keratosis: Secondary | ICD-10-CM

## 2020-05-01 DIAGNOSIS — D239 Other benign neoplasm of skin, unspecified: Secondary | ICD-10-CM

## 2020-05-01 DIAGNOSIS — L918 Other hypertrophic disorders of the skin: Secondary | ICD-10-CM

## 2020-05-01 DIAGNOSIS — D1801 Hemangioma of skin and subcutaneous tissue: Secondary | ICD-10-CM

## 2020-05-01 DIAGNOSIS — L570 Actinic keratosis: Secondary | ICD-10-CM

## 2020-05-01 DIAGNOSIS — L853 Xerosis cutis: Secondary | ICD-10-CM

## 2020-05-01 DIAGNOSIS — L739 Follicular disorder, unspecified: Secondary | ICD-10-CM | POA: Insufficient documentation

## 2020-05-01 DIAGNOSIS — L578 Other skin changes due to chronic exposure to nonionizing radiation: Secondary | ICD-10-CM

## 2020-05-01 HISTORY — DX: Actinic keratosis: L57.0

## 2020-05-01 HISTORY — DX: Other seborrheic keratosis: L82.1

## 2020-05-01 HISTORY — DX: Follicular disorder, unspecified: L73.9

## 2020-05-01 HISTORY — DX: Other skin changes due to chronic exposure to nonionizing radiation: L57.8

## 2020-05-01 HISTORY — DX: Melanocytic nevi, unspecified: D22.9

## 2020-05-01 HISTORY — DX: Other benign neoplasm of skin, unspecified: D23.9

## 2020-05-01 HISTORY — DX: Hemangioma of skin and subcutaneous tissue: D18.01

## 2020-05-01 HISTORY — DX: Other hypertrophic disorders of the skin: L91.8

## 2020-05-01 HISTORY — DX: Other specified follicular disorders: L73.8

## 2020-05-01 HISTORY — DX: Xerosis cutis: L85.3

## 2020-06-14 DIAGNOSIS — M25512 Pain in left shoulder: Secondary | ICD-10-CM | POA: Insufficient documentation

## 2020-06-14 HISTORY — DX: Pain in left shoulder: M25.512

## 2020-12-05 DIAGNOSIS — K219 Gastro-esophageal reflux disease without esophagitis: Secondary | ICD-10-CM

## 2020-12-05 HISTORY — DX: Gastro-esophageal reflux disease without esophagitis: K21.9

## 2021-11-13 ENCOUNTER — Other Ambulatory Visit (HOSPITAL_COMMUNITY): Payer: Self-pay

## 2021-11-13 ENCOUNTER — Emergency Department (HOSPITAL_COMMUNITY): Payer: Medicare Other

## 2021-11-13 ENCOUNTER — Encounter (HOSPITAL_COMMUNITY): Payer: Self-pay | Admitting: Emergency Medicine

## 2021-11-13 ENCOUNTER — Emergency Department (HOSPITAL_COMMUNITY)
Admission: EM | Admit: 2021-11-13 | Discharge: 2021-11-13 | Disposition: A | Discharge status: Home / Self Care | Payer: Medicare Other | Attending: Emergency Medicine | Admitting: Emergency Medicine

## 2021-11-13 ENCOUNTER — Other Ambulatory Visit: Payer: Self-pay

## 2021-11-13 DIAGNOSIS — Z7982 Long term (current) use of aspirin: Secondary | ICD-10-CM | POA: Diagnosis not present

## 2021-11-13 DIAGNOSIS — R002 Palpitations: Secondary | ICD-10-CM | POA: Diagnosis present

## 2021-11-13 DIAGNOSIS — I132 Hypertensive heart and chronic kidney disease with heart failure and with stage 5 chronic kidney disease, or end stage renal disease: Secondary | ICD-10-CM | POA: Diagnosis not present

## 2021-11-13 DIAGNOSIS — N186 End stage renal disease: Secondary | ICD-10-CM | POA: Diagnosis not present

## 2021-11-13 DIAGNOSIS — Z7901 Long term (current) use of anticoagulants: Secondary | ICD-10-CM | POA: Insufficient documentation

## 2021-11-13 DIAGNOSIS — Z992 Dependence on renal dialysis: Secondary | ICD-10-CM | POA: Diagnosis not present

## 2021-11-13 DIAGNOSIS — I4891 Unspecified atrial fibrillation: Secondary | ICD-10-CM | POA: Diagnosis not present

## 2021-11-13 DIAGNOSIS — Z79899 Other long term (current) drug therapy: Secondary | ICD-10-CM | POA: Insufficient documentation

## 2021-11-13 DIAGNOSIS — I5032 Chronic diastolic (congestive) heart failure: Secondary | ICD-10-CM | POA: Diagnosis not present

## 2021-11-13 LAB — CBC WITH DIFFERENTIAL/PLATELET
Abs Immature Granulocytes: 0.1 10*3/uL — ABNORMAL HIGH (ref 0.00–0.07)
Basophils Absolute: 0 10*3/uL (ref 0.0–0.1)
Basophils Relative: 1 %
Eosinophils Absolute: 0.1 10*3/uL (ref 0.0–0.5)
Eosinophils Relative: 2 %
HCT: 46.4 % (ref 39.0–52.0)
Hemoglobin: 15 g/dL (ref 13.0–17.0)
Immature Granulocytes: 2 %
Lymphocytes Relative: 13 %
Lymphs Abs: 0.8 10*3/uL (ref 0.7–4.0)
MCH: 31.1 pg (ref 26.0–34.0)
MCHC: 32.3 g/dL (ref 30.0–36.0)
MCV: 96.3 fL (ref 80.0–100.0)
Monocytes Absolute: 0.6 10*3/uL (ref 0.1–1.0)
Monocytes Relative: 9 %
Neutro Abs: 4.6 10*3/uL (ref 1.7–7.7)
Neutrophils Relative %: 73 %
Platelets: 150 10*3/uL (ref 150–400)
RBC: 4.82 MIL/uL (ref 4.22–5.81)
RDW: 13.3 % (ref 11.5–15.5)
WBC: 6.2 10*3/uL (ref 4.0–10.5)
nRBC: 0 % (ref 0.0–0.2)

## 2021-11-13 LAB — BASIC METABOLIC PANEL
Anion gap: 8 (ref 5–15)
BUN: 21 mg/dL (ref 8–23)
CO2: 23 mmol/L (ref 22–32)
Calcium: 9.1 mg/dL (ref 8.9–10.3)
Chloride: 105 mmol/L (ref 98–111)
Creatinine, Ser: 2.08 mg/dL — ABNORMAL HIGH (ref 0.61–1.24)
GFR, Estimated: 35 mL/min — ABNORMAL LOW (ref >60)
Glucose, Bld: 124 mg/dL — ABNORMAL HIGH (ref 70–99)
Potassium: 4 mmol/L (ref 3.5–5.1)
Sodium: 136 mmol/L (ref 135–145)

## 2021-11-13 LAB — TROPONIN I (HIGH SENSITIVITY): Troponin I (High Sensitivity): 37 ng/L — ABNORMAL HIGH (ref <18)

## 2021-11-13 LAB — MAGNESIUM: Magnesium: 1.7 mg/dL (ref 1.7–2.4)

## 2021-11-13 MED ORDER — APIXABAN 5 MG PO TABS: 5.0000 mg | ORAL_TABLET | Freq: Two times a day (BID) | ORAL | 0 refills | Status: DC

## 2021-11-13 MED ORDER — LACTATED RINGERS IV BOLUS
1000.0000 mL | Freq: Once | INTRAVENOUS | Status: AC
  Administered 2021-11-13: 08:00:00 1000 mL via INTRAVENOUS

## 2021-11-13 MED ORDER — TACROLIMUS ER 4 MG PO TB24
6.0000 mg | ORAL_TABLET | Freq: Once | ORAL | Status: AC
  Administered 2021-11-13: 10:00:00 6 mg via ORAL
  Filled 2021-11-13: qty 2

## 2021-11-13 MED ORDER — PROPOFOL 10 MG/ML IV BOLUS
INTRAVENOUS | Status: DC | PRN
  Administered 2021-11-13: 20 mg via INTRAVENOUS
  Administered 2021-11-13: 80 mg via INTRAVENOUS

## 2021-11-13 MED ORDER — PHENYLEPHRINE 40 MCG/ML (10ML) SYRINGE FOR IV PUSH (FOR BLOOD PRESSURE SUPPORT): 80.0000 ug | PREFILLED_SYRINGE | Freq: Once | INTRAVENOUS | Status: DC

## 2021-11-13 MED ORDER — APIXABAN 5 MG PO TABS
5.0000 mg | ORAL_TABLET | Freq: Two times a day (BID) | ORAL | 0 refills | Status: DC
  Filled 2021-11-13: qty 60, 30d supply, fill #0

## 2021-11-13 MED ORDER — SULFAMETHOXAZOLE-TRIMETHOPRIM 400-80 MG PO TABS
1.0000 | ORAL_TABLET | Freq: Once | ORAL | Status: AC
  Administered 2021-11-13: 10:00:00 1 via ORAL
  Filled 2021-11-13: qty 1

## 2021-11-13 MED ORDER — APIXABAN 5 MG PO TABS
5.0000 mg | ORAL_TABLET | Freq: Once | ORAL | Status: AC
  Administered 2021-11-13: 10:00:00 5 mg via ORAL
  Filled 2021-11-13: qty 1

## 2021-11-13 MED ORDER — PROPOFOL 10 MG/ML IV BOLUS: 0.5000 mg/kg | Freq: Once | INTRAVENOUS | Status: DC

## 2021-11-13 MED ORDER — PREDNISONE 5 MG PO TABS
5.0000 mg | ORAL_TABLET | Freq: Once | ORAL | Status: AC
  Administered 2021-11-13: 10:00:00 5 mg via ORAL
  Filled 2021-11-13: qty 1

## 2021-11-13 MED ORDER — MYCOPHENOLATE SODIUM 180 MG PO TBEC
540.0000 mg | DELAYED_RELEASE_TABLET | Freq: Once | ORAL | Status: AC
  Administered 2021-11-13: 10:00:00 540 mg via ORAL
  Filled 2021-11-13: qty 3

## 2021-11-13 NOTE — ED Provider Notes (Signed)
[facility] EMERGENCY DEPARTMENT Provider Note   CSN: [id number] Arrival date & time: 11/13/21  0445     History Chief Complaint  Patient presents with   Atrial Fibrillation    [name] [name] is a 60 y.o. male with PMH ESRD previously on dialysis now status post renal transplant with baseline creatinine of 1.8-2.0, remote history of paroxysmal A. fib no longer on anticoagulation, gout, HLD, HTN who presents the emergency department for evaluation of palpitations.  Patient states that exactly 3 AM this morning he awoke with palpitations and rapid heart rate.  He states that he felt like this 6 years ago during his last episode of A. fib, but this was pretransplant and he has not had any events since.  He does take daily Toprol and did not take his medications this morning.  Currently denies chest pain, shortness of breath, Donnell pain, nausea, vomiting, diarrhea or any other systemic symptoms.  Patient arrives hemodynamically stable but with heart rates in the 150s.   Atrial Fibrillation Pertinent negatives include no chest pain, no abdominal pain and no shortness of breath.      Past Medical History:  Diagnosis Date   Arthritis    Cataract    bilateral- right removed, left present   Chronic diastolic CHF (congestive heart failure) (HCC)    Complication of anesthesia    problem with bladder not waking up after surgery-for right nephrectomy   Dysrhythmia    Enlarged kidney    right   ESRD (end stage renal disease) on dialysis ([place])    Gout    Hyperlipidemia    Hypertension    PAF (paroxysmal atrial fibrillation) ([place])    happens about every 6 years-sees nishan   Renal insufficiency    KIDNEY DISEASE - FOLLOWED BY DR. Lorrene Reid NOTES ON CHART   Vertigo     Patient Active Problem List   Diagnosis Date Noted   TIA (transient ischemic attack) 07/14/2017   Encounter for therapeutic drug monitoring 12/05/2016   ESRD on dialysis ([place])  11/28/2016   Hypokalemia 11/28/2016   HLD (hyperlipidemia) 11/28/2016   Atrial fibrillation ([place]) 11/28/2016   Chronic diastolic CHF (congestive heart failure) ([place])    Status post laparoscopic cholecystectomy August 2016 07/13/2015   Renal failure 07/13/2015   Chronic kidney disease, stage V ([place]) 09/18/2014   Encounter for adequacy testing for dialysis ([place]) 09/18/2014   S/P repair of ventral hernia 06/21/2014   Lapband APS July 2015 06/20/2014   Status post gastric banding 06/20/2014   Morbid obesity BMI 42 01/04/2014   Hypertension    Renal disorder    Gout    Atrial fibrillation with RVR ([place])     Past Surgical History:  Procedure Laterality Date   AV FISTULA PLACEMENT     "IT CLOSED UP AND DOES NOT WORK"   AV FISTULA PLACEMENT Right 10/27/2014   Procedure: ARTERIOVENOUS (AV) FISTULA CREATION;  Surgeon: Conrad [place], MD;  Location: [facility];  Service: Vascular;  Laterality: Right;   AV FISTULA PLACEMENT Right 08/22/2015   Procedure: BRACHIOCEPHALIC ARTERIOVENOUS (AV) FISTULA CREATION;  Surgeon: Conrad [place], MD;  Location: [facility];  Service: Vascular;  Laterality: Right;   CATARACTS REMOVED Right    CHOLECYSTECTOMY N/A 07/13/2015   Procedure: LAPAROSCOPIC CHOLECYSTECTOMY WITH INTRAOPERATIVE CHOLANGIOGRAM;  Surgeon: Johnathan Hausen, MD;  Location: [facility];  Service: General;  Laterality: N/A;   EYE SURGERY     left cataract surgery-lens implant   HERNIA REPAIR  INSERTION OF MESH N/A 06/20/2014   Procedure: INSERTION OF MESH;  Surgeon: Pedro Earls, MD;  Location: [facility];  Service: General;  Laterality: N/A;   KNEE SURGERY  2001   LAPAROSCOPIC GASTRIC BANDING N/A 06/20/2014   Procedure: LAPAROSCOPIC GASTRIC BANDING and VENTRAL HERNIA REPAIR WITH MESH;  Surgeon: Pedro Earls, MD;  Location: [facility];  Service: General;  Laterality: N/A;   TOTAL NEPHRECTOMY Right        Family History  Problem Relation Age of Onset   Hypertension Mother     Heart disease Mother        before age 31   Cancer Father        bone   Cancer Sister        breast   Cancer Sister        breast   CAD Unknown    Colon cancer Neg Hx    Rectal cancer Neg Hx    Stomach cancer Neg Hx     Social History   Tobacco Use   Smoking status: Never   Smokeless tobacco: Never  Vaping Use   Vaping Use: Never used  Substance Use Topics   Alcohol use: No    Alcohol/week: 0.0 standard drinks   Drug use: No    Home Medications Prior to Admission medications   Medication Sig Start Date End Date Taking? Authorizing Provider  ALPRAZolam Duanne Moron) 1 MG tablet Take 1-2 tablets thirty minutes prior to MRI.  May take one additional tablet before entering scanner, if needed.  MUST HAVE DRIVER. [birth year]   Marcial Pacas, MD  apixaban (ELIQUIS) 5 MG TABS tablet Take 1 tablet (5 mg total) by mouth 2 (two) times daily. 11/13/21 12/13/21  Shammara Jarrett, Debe Coder, MD  aspirin 81 MG chewable tablet Chew 81 mg by mouth daily.    [provider]  atorvastatin (LIPITOR) 10 MG tablet Take 10 mg by mouth every evening.     [provider]  b complex-vitamin c-folic acid (NEPHRO-VITE) 0.8 MG TABS tablet Take 1 tablet by mouth every evening.    [provider]  calcitRIOL (ROCALTROL) 0.25 MCG capsule Take 0.25 mcg by mouth daily. 06/22/17   [provider]  calcium carbonate (TUMS - DOSED IN MG ELEMENTAL CALCIUM) 500 MG chewable tablet Chew 1 tablet by mouth 3 (three) times daily with meals.    [provider]  lidocaine-prilocaine (EMLA) cream Apply 1 application topically daily. 05/03/17   [provider]  meclizine (ANTIVERT) 25 MG tablet as needed. 05/16/17   [provider]  metoprolol tartrate (LOPRESSOR) 25 MG tablet Take 1 tablet (25 mg total) by mouth 2 (two) times daily. Patient taking differently: Take 25 mg by mouth daily.  12/03/16   Dessa Phi, DO    Allergies    Patient has no known allergies.  Review of  Systems   Review of Systems  Constitutional:  Negative for chills and fever.  HENT:  Negative for ear pain and sore throat.   Eyes:  Negative for pain and visual disturbance.  Respiratory:  Negative for cough and shortness of breath.   Cardiovascular:  Positive for palpitations. Negative for chest pain.  Gastrointestinal:  Negative for abdominal pain and vomiting.  Genitourinary:  Negative for dysuria and hematuria.  Musculoskeletal:  Negative for arthralgias and back pain.  Skin:  Negative for color change and rash.  Neurological:  Negative for seizures and syncope.  All other systems reviewed and are negative.  Physical Exam Updated  Vital Signs BP 115/73    Pulse 66    Temp 98.1 F (36.7 C) (Oral)    Resp 14    Ht 5\' 10"  (1.778 m)    Wt 122.5 kg    SpO2 96%    BMI 38.74 kg/m   Physical Exam Vitals and nursing note reviewed.  Constitutional:      General: He is not in acute distress.    Appearance: He is well-developed.  HENT:     Head: Normocephalic and atraumatic.  Eyes:     Conjunctiva/sclera: Conjunctivae normal.  Cardiovascular:     Rate and Rhythm: Tachycardia present. Rhythm irregular.     Heart sounds: No murmur heard. Pulmonary:     Effort: Pulmonary effort is normal. No respiratory distress.     Breath sounds: Normal breath sounds.  Abdominal:     Palpations: Abdomen is soft.     Tenderness: There is no abdominal tenderness.  Musculoskeletal:        General: No swelling.     Cervical back: Neck supple.  Skin:    General: Skin is warm and dry.     Capillary Refill: Capillary refill takes less than 2 seconds.  Neurological:     Mental Status: He is alert.  Psychiatric:        Mood and Affect: Mood normal.    ED Results / Procedures / Treatments   Labs (all labs ordered are listed, but only abnormal results are displayed) Labs Reviewed  CBC WITH DIFFERENTIAL/PLATELET - Abnormal; Notable for the following components:      Result Value   Abs Immature  Granulocytes 0.10 (*)    All other components within normal limits  BASIC METABOLIC PANEL - Abnormal; Notable for the following components:   Glucose, Bld 124 (*)    Creatinine, Ser 2.08 (*)    GFR, Estimated 35 (*)    All other components within normal limits  TROPONIN I (HIGH SENSITIVITY) - Abnormal; Notable for the following components:   Troponin I (High Sensitivity) 37 (*)    All other components within normal limits  MAGNESIUM    EKG None  Radiology DG Chest 2 View  Result Date: 11/13/2021 CLINICAL DATA:  Chest pain and shortness of breath EXAM: CHEST - 2 VIEW COMPARISON:  07/03/2017 FINDINGS: Eventration of the right diaphragm, long-standing. There is no edema, consolidation, effusion, or pneumothorax. Lap band in unremarkable position although only faintly visible on the frontal projection. Stable heart size and mediastinal contours. IMPRESSION: No acute or interval finding. Electronically Signed   By: Jorje Guild M.D.   On: 11/13/2021 05:59    Procedures .Critical Care Performed by: Teressa Lower, MD Authorized by: Teressa Lower, MD   Critical care provider statement:    Critical care time (minutes):  30   Critical care was necessary to treat or prevent imminent or life-threatening deterioration of the following conditions:  Cardiac failure   Critical care was time spent personally by me on the following activities:  Development of treatment plan with patient or surrogate, discussions with consultants, evaluation of patient's response to treatment, examination of patient, ordering and review of laboratory studies, ordering and review of radiographic studies, ordering and performing treatments and interventions, pulse oximetry, re-evaluation of patient's condition and review of old charts .Sedation  Date/Time: 11/13/2021 9:57 AM Performed by: Teressa Lower, MD Authorized by: Teressa Lower, MD   Consent:    Consent obtained:  Written   Consent given by:   Patient   Risks  discussed:  Allergic reaction, prolonged hypoxia resulting in organ damage, respiratory compromise necessitating ventilatory assistance and intubation, nausea, vomiting, dysrhythmia and inadequate sedation   Alternatives discussed:  Analgesia without sedation and anxiolysis Universal protocol:    Immediately prior to procedure, a time out was called: yes   Pre-sedation assessment:    Time since last food or drink:  1800 11/12/21   ASA classification: class 2 - patient with mild systemic disease     Mallampati score:  I - soft palate, uvula, fauces, pillars visible   Pre-sedation assessments completed and reviewed: airway patency, cardiovascular function, hydration status, mental status, nausea/vomiting, pain level, respiratory function and temperature   Immediate pre-procedure details:    Reviewed: vital signs     Verified: emergency equipment available, IV patency confirmed, reversal medications available and suction available   Procedure details (see MAR for exact dosages):    Preoxygenation:  Nasal cannula   Sedation:  Propofol   Intended level of sedation: deep   Analgesia:  None   Intra-procedure monitoring:  Blood pressure monitoring, frequent LOC assessments, continuous pulse oximetry and cardiac monitor   Intra-procedure events: none     Intra-procedure management:  Airway repositioning   Total Provider sedation time (minutes):  30 Post-procedure details:    Procedure completion:  Tolerated well, no immediate complications .Cardioversion  Date/Time: 11/13/2021 9:58 AM Performed by: Teressa Lower, MD Authorized by: Teressa Lower, MD   Consent:    Consent obtained:  Written   Consent given by:  Patient   Risks discussed:  Cutaneous burn, induced arrhythmia, pain and death   Alternatives discussed:  Rate-control medication Pre-procedure details:    Cardioversion basis:  Elective   Rhythm:  Atrial fibrillation   Electrode placement:   Anterior-posterior Patient sedated: Yes. Refer to sedation procedure documentation for details of sedation.  Attempt one:    Cardioversion mode:  Synchronous   Waveform:  Monophasic   Shock (Joules):  200   Shock outcome:  Conversion to normal sinus rhythm Post-procedure details:    Patient status:  Awake   Patient tolerance of procedure:  Tolerated well, no immediate complications   Medications Ordered in ED Medications  propofol (DIPRIVAN) 10 mg/mL bolus/IV push 61.3 mg (has no administration in time range)  PHENYLephrine 40 mcg/ml in normal saline Adult IV Push Syringe (For Blood Pressure Support) (has no administration in time range)  propofol (DIPRIVAN) 10 mg/mL bolus/IV push (20 mg Intravenous Given 11/13/21 0844)  lactated ringers bolus 1,000 mL (1,000 mLs Intravenous New Bag/Given 11/13/21 0756)  mycophenolate (MYFORTIC) EC tablet 540 mg (540 mg Oral Given 11/13/21 0934)  tacrolimus ER (ENVARSUS XR) tablet 6 mg (6 mg Oral Given 11/13/21 0934)  sulfamethoxazole-trimethoprim (BACTRIM) 400-80 MG per tablet 1 tablet (1 tablet Oral Given 11/13/21 0934)  predniSONE (DELTASONE) tablet 5 mg (5 mg Oral Given 11/13/21 0935)  apixaban (ELIQUIS) tablet 5 mg (5 mg Oral Given 11/13/21 0086)    ED Course  I have reviewed the triage vital signs and the nursing notes.  Pertinent labs & imaging results that were available during my care of the patient were reviewed by me and considered in my medical decision making (see chart for details).    MDM Rules/Calculators/A&P                           Patient seen emergency department for evaluation of palpitations.  Physical exam largely unremarkable outside of a irregular tachycardia.  ECG with A.  fib with RVR.  Laboratory evaluation unremarkable with a creatinine of 2.0 but no electrolyte abnormalities that would explain why the patient went to A. fib with RVR.  High-sensitivity troponin slightly elevated at 37 which is likely type II demand  ischemia in the setting of his A. fib with RVR.  I spoke with cardiology (dr. Tamala Julian) who states that the patient is safe for elective cardioversion as he is within 24 hours of symptom onset despite distant history of A. fib and not being on anticoagulation.  Elective cardioversion performed and sedation performed with propofol leading to conversion to normal sinus rhythm.  Spoke again with cardiology who recommended initiation of Eliquis 5 mg twice daily and the patient will follow-up outpatient with heart care.  On reevaluation, there is no evidence of any neurologic deficit and the patient was discharged after receiving his apixaban. Final Clinical Impression(s) / ED Diagnoses Final diagnoses:  Atrial fibrillation with RVR ([place])    Rx / DC Orders ED Discharge Orders          Ordered    apixaban (ELIQUIS) 5 MG TABS tablet  2 times daily,   Status:  Discontinued        11/13/21 0929    apixaban (ELIQUIS) 5 MG TABS tablet  2 times daily        11/13/21 0950             Jaicion Laurie, Debe Coder, MD 11/13/21 1001

## 2021-11-13 NOTE — ED Notes (Signed)
Pt in triage  

## 2021-11-13 NOTE — ED Provider Notes (Addendum)
Emergency Medicine Provider Triage Evaluation Note  [name] [name] , a 60 y.o. male  was evaluated in triage.  Pt complains of Palpitations.  Began about 3 AM this morning.  History of atrial fibrillation, does not take anticoagulation.  Does have pain to his left scapula.  No trauma. Denies any overt chest pain.  Does feel mildly short of breath.  No lower extremity swelling.  Patient states he is needed to be cardioverted multiple times previously. Cardizem with EMS. Hrs 90-120's with EMS. Prior dialysis pt s/p kidney transplant 2020, followed by [facility]  Review of Systems  Positive: Palpitations, SOB Negative: LE edema, weakness  Physical Exam  BP 118/78    Pulse (!) 128    Temp 98.9 F (37.2 C)    Resp 17    SpO2 97%  Gen:   Awake, no distress   Resp:  Normal effort  Cardiac: Tachy, irregular rhythm  MSK:   Moves extremities without difficulty  Other:    Medical Decision Making  Medically screening exam initiated at 4:57 AM.  Appropriate orders placed.  [name] [name] was informed that the remainder of the evaluation will be completed by another provider, this initial triage assessment does not replace that evaluation, and the importance of remaining in the ED until their evaluation is complete.  Palpitations, SOB   EKG with A. fib, RVR with heart rates 120s-150s  Charge nurse [place] notified patient needs room in back.     [name] [name] A, PA-C 11/13/21 0500    [name] Speak, MD 11/13/21 [phone]

## 2021-11-13 NOTE — ED Triage Notes (Signed)
Signed            Patient arrives via EMS from home due to rapid heart rate. Patient states that he woke up to his heart racing and had a discomfort that was radiating to his left shoulder. EMS was called due to the patient having a hx of a.fib with rapid rate before that had to be cardioverted.    Patient's HR was initially 170 with ems. Given 20mg  of Cardizem en route and a 500cc NS bolus. Heart rate decreased to 125 after medication.    Patient also had a kidney transplant in Feb 2020 and he was on dialysis prior (has an old right arm fistula).    Arrives alert and oriented and 99% on RA

## 2021-11-13 NOTE — ED Notes (Signed)
Consent signed for cardioversion. Patient placed on 12 lead and zoll pads. Suction and ambu bag at bedside. Patient placed on C02 monitor and on 2L [place].

## 2021-11-13 NOTE — Sedation Documentation (Signed)
200J shocked delivered

## 2021-11-18 ENCOUNTER — Other Ambulatory Visit (HOSPITAL_COMMUNITY): Payer: Self-pay

## 2021-11-18 ENCOUNTER — Encounter: Payer: Self-pay | Admitting: Cardiology

## 2021-11-18 ENCOUNTER — Other Ambulatory Visit: Payer: Self-pay

## 2021-11-18 ENCOUNTER — Ambulatory Visit (INDEPENDENT_AMBULATORY_CARE_PROVIDER_SITE_OTHER): Payer: Medicare Other

## 2021-11-18 ENCOUNTER — Ambulatory Visit: Payer: Medicare Other | Admitting: Cardiology

## 2021-11-18 VITALS — BP 124/60 | HR 62 | Ht 70.0 in | Wt 273.0 lb

## 2021-11-18 DIAGNOSIS — I10 Essential (primary) hypertension: Secondary | ICD-10-CM | POA: Diagnosis not present

## 2021-11-18 DIAGNOSIS — Z13220 Encounter for screening for lipoid disorders: Secondary | ICD-10-CM | POA: Diagnosis not present

## 2021-11-18 DIAGNOSIS — I480 Paroxysmal atrial fibrillation: Secondary | ICD-10-CM

## 2021-11-18 MED ORDER — RIVAROXABAN 15 MG PO TABS: 15.0000 mg | ORAL_TABLET | Freq: Every day | ORAL | 3 refills | Status: DC

## 2021-11-18 MED ORDER — RIVAROXABAN 15 MG PO TABS
15.0000 mg | ORAL_TABLET | Freq: Every day | ORAL | 5 refills | Status: DC
  Filled 2021-11-18: qty 30, 30d supply, fill #0

## 2021-11-18 NOTE — Progress Notes (Signed)
Cardiology Office Note:    Date:  11/18/2021   ID:  [name] [name], DOB 04/20/1961, MRN [id number]  PCP:  Patient, No Pcp Per (Inactive)   [facility] Providers Cardiologist:  Kate Sable, MD     Referring MD: No ref. provider found   Chief Complaint  Patient presents with   New Patient (Initial Visit)    Hospital follow up -- Meds reviewed verbally with patient.     History of Present Illness:    [name] [name] is a 60 y.o. male with a hx of hypertension, hyperlipidemia, paroxysmal atrial fibrillation, ESRD previously on HD now s/p renal transplant 2019 presenting for hospital follow-up.  Last seen by the practice see [facility] [place] in 2018 after being admitted to the hospital for A. fib RVR.  He required urgent cardioversion at that time.  Due to ESRD, not deemed a candidate for NOAC.  Started on Coumadin.  Echocardiogram 2017 EF 60 to 65%, mildly dilated LA. left heart cath 2019 at [facility] left dominant, no significant CAD small nondominant RCA. he states doing okay ever since his last cardioversion in 2018 until 5 days ago when he developed palpitations.  He was seen in the ED 11/13/2021 due to palpitations EKG showed atrial fibrillation with rapid ventricular response, he underwent DC cardioversion in the hospital successfully.  Started on Eliquis for stroke prophylaxis.  States cost for Eliquis is too high.  He denies having any history of TIA even though this was mentioned in the chart.  Past Medical History:  Diagnosis Date   Arthritis    Cataract    bilateral- right removed, left present   Chronic diastolic CHF (congestive heart failure) (HCC)    Complication of anesthesia    problem with bladder not waking up after surgery-for right nephrectomy   Dysrhythmia    Enlarged kidney    right   ESRD (end stage renal disease) on dialysis (HCC)    Gout    Hyperlipidemia    Hypertension    PAF (paroxysmal atrial fibrillation) ([place])    happens about  every 6 years-sees nishan   Renal insufficiency    KIDNEY DISEASE - FOLLOWED BY DR. Lorrene Reid NOTES ON CHART   Vertigo     Past Surgical History:  Procedure Laterality Date   AV FISTULA PLACEMENT     "IT CLOSED UP AND DOES NOT WORK"   AV FISTULA PLACEMENT Right 10/27/2014   Procedure: ARTERIOVENOUS (AV) FISTULA CREATION;  Surgeon: Conrad Cullom, MD;  Location: [place];  Service: Vascular;  Laterality: Right;   AV FISTULA PLACEMENT Right 08/22/2015   Procedure: BRACHIOCEPHALIC ARTERIOVENOUS (AV) FISTULA CREATION;  Surgeon: Conrad Brook, MD;  Location: [place];  Service: Vascular;  Laterality: Right;   CATARACTS REMOVED Right    CHOLECYSTECTOMY N/A 07/13/2015   Procedure: LAPAROSCOPIC CHOLECYSTECTOMY WITH INTRAOPERATIVE CHOLANGIOGRAM;  Surgeon: Johnathan Hausen, MD;  Location: [facility];  Service: General;  Laterality: N/A;   EYE SURGERY     left cataract surgery-lens implant   HERNIA REPAIR     INSERTION OF MESH N/A 06/20/2014   Procedure: INSERTION OF MESH;  Surgeon: Pedro Earls, MD;  Location: [facility];  Service: General;  Laterality: N/A;   KNEE SURGERY  2001   LAPAROSCOPIC GASTRIC BANDING N/A 06/20/2014   Procedure: LAPAROSCOPIC GASTRIC BANDING and VENTRAL HERNIA REPAIR WITH MESH;  Surgeon: Pedro Earls, MD;  Location: [facility];  Service: General;  Laterality: N/A;   TOTAL NEPHRECTOMY Right  Current Medications: Current Meds  Medication Sig   calcitRIOL (ROCALTROL) 0.25 MCG capsule Take 0.25 mcg by mouth daily.   ENVARSUS XR 1 MG TB24 Take 6 mg by mouth daily.   magnesium oxide (MAG-OX) 400 MG tablet Take 400 mg by mouth in the morning and at bedtime.   metoprolol succinate (TOPROL-XL) 25 MG 24 hr tablet Take 25 mg by mouth daily.   mycophenolate (MYFORTIC) 180 MG EC tablet Take 540 mg by mouth 2 (two) times daily.   predniSONE (DELTASONE) 5 MG tablet Take 5 mg by mouth daily.   Rivaroxaban (XARELTO) 15 MG TABS tablet Take 1 tablet (15 mg total) by mouth daily with supper.    [DISCONTINUED] apixaban (ELIQUIS) 5 MG TABS tablet Take 1 tablet (5 mg total) by mouth 2 (two) times daily.   [DISCONTINUED] Rivaroxaban (XARELTO) 15 MG TABS tablet Take 1 tablet (15 mg total) by mouth daily.     Allergies:   Patient has no known allergies.   Social History   Socioeconomic History   Marital status: Married    Spouse name: Not on file   Number of children: 12   Years of education: 2   Highest education level: Not on file  Occupational History   Occupation: Retired  Tobacco Use   Smoking status: Never   Smokeless tobacco: Never  Vaping Use   Vaping Use: Never used  Substance and Sexual Activity   Alcohol use: No    Alcohol/week: 0.0 standard drinks   Drug use: No   Sexual activity: Not on file  Other Topics Concern   Not on file  Social History Narrative   Lives at home with his wife.   Right-handed.   No caffeine use.   Social Determinants of Health   Financial Resource Strain: Not on file  Food Insecurity: Not on file  Transportation Needs: Not on file  Physical Activity: Not on file  Stress: Not on file  Social Connections: Not on file     Family History: The patient's family history includes CAD in his unknown relative; Cancer in his father, sister, and sister; Heart disease in his mother; Hypertension in his mother. There is no history of Colon cancer, Rectal cancer, or Stomach cancer.  ROS:   Please see the history of present illness.     All other systems reviewed and are negative.  EKGs/Labs/Other Studies Reviewed:    The following studies were reviewed today:   EKG:  EKG is  ordered today.  The ekg ordered today demonstrates normal sinus rhythm, normal ECG  Recent Labs: 11/13/2021: BUN 21; Creatinine, Ser 2.08; Hemoglobin 15.0; Magnesium 1.7; Platelets 150; Potassium 4.0; Sodium 136  Recent Lipid Panel    Component Value Date/Time   CHOL 74 11/28/2016 0346   TRIG 56 11/28/2016 0346   HDL 24 (L) 11/28/2016 0346   CHOLHDL 3.1  11/28/2016 0346   VLDL 11 11/28/2016 0346   LDLCALC 39 11/28/2016 0346     Risk Assessment/Calculations:        Physical Exam:    VS:  BP 124/60 (BP Location: Left Arm, Patient Position: Sitting, Cuff Size: Normal)    Pulse 62    Ht 5\' 10"  (1.778 m)    Wt 273 lb (123.8 kg)    SpO2 98%    BMI 39.17 kg/m     Wt Readings from Last 3 Encounters:  11/18/21 273 lb (123.8 kg)  11/13/21 270 lb (122.5 kg)  07/14/17 276 lb (125.2 kg)  GEN:  Well nourished, well developed in no acute distress HEENT: Normal NECK: No JVD; No carotid bruits LYMPHATICS: No lymphadenopathy CARDIAC: RRR, no murmurs, rubs, gallops RESPIRATORY:  Clear to auscultation without rales, wheezing or rhonchi  ABDOMEN: Soft, non-tender, non-distended MUSCULOSKELETAL:  No edema; No deformity  SKIN: Warm and dry NEUROLOGIC:  Alert and oriented x 3 PSYCHIATRIC:  Normal affect   ASSESSMENT:    1. Paroxysmal atrial fibrillation (HCC)   2. Primary hypertension   3. Screening for hyperlipidemia    PLAN:    In order of problems listed above:  Paroxysmal atrial fibrillation s/p DC cardioversion 5 days ago.  CHA2DS2-VASc score 1.  Continue Toprol-XL 25 mg daily, start Xarelto 15 mg daily.  If Xarelto also cost prohibitive, plan to start Coumadin.  Get echocardiogram.  Refer patient to EP/A. fib clinic.  Place cardiac monitor to evaluate any A. fib recurrence since cardioversion. Hypertension, BP controlled.  Continue Toprol-XL.  Follow-up after echo and cardiac monitor.       Medication Adjustments/Labs and Tests Ordered: Current medicines are reviewed at length with the patient today.  Concerns regarding medicines are outlined above.  Orders Placed This Encounter  Procedures   Lipid panel   Ambulatory referral to Cardiac Electrophysiology   LONG TERM MONITOR (3-14 DAYS)   EKG 12-Lead   ECHOCARDIOGRAM COMPLETE   Meds ordered this encounter  Medications   DISCONTD: Rivaroxaban (XARELTO) 15 MG TABS tablet     Sig: Take 1 tablet (15 mg total) by mouth daily.    Dispense:  30 tablet    Refill:  5   Rivaroxaban (XARELTO) 15 MG TABS tablet    Sig: Take 1 tablet (15 mg total) by mouth daily with supper.    Dispense:  30 tablet    Refill:  3    Patient is going to use the rest of there Eliquis then call when ready to fill.    Patient Instructions  Medication Instructions:   Your physician has recommended you make the following change in your medication:    START taking Xarelto 15 MG once a day.  You can use up your Eliquis first and then start the Xarelto.   *If you need a refill on your cardiac medications before your next appointment, please call your pharmacy*   Lab Work:  Your physician recommends that you return for a FASTING lipid profile: Prior to your follow up.  - You will need to be fasting. Please do not have anything to eat or drink after midnight the morning you have the lab work. You may only have water or black coffee with no cream or sugar.   Please return to our office on ____________________at___________am/pm   Testing/Procedures:   Your physician has requested that you have an echocardiogram. Echocardiography is a painless test that uses sound waves to create images of your heart. It provides your doctor with information about the size and shape of your heart and how well your hearts chambers and valves are working. This procedure takes approximately one hour. There are no restrictions for this procedure.  2.    Your physician has recommended that you wear a Zio monitor for 2 weeks.   This monitor is a medical device that records the hearts electrical activity. Doctors most often use these monitors to diagnose arrhythmias. Arrhythmias are problems with the speed or rhythm of the heartbeat. The monitor is a small device applied to your chest. You can wear one while you do your normal  daily activities. While wearing this monitor if you have any symptoms to push the  button and record what you felt. Once you have worn this monitor for the period of time provider prescribed (Usually 14 days), you will return the monitor device in the postage paid box. Once it is returned they will download the data collected and provide [place] with a report which the provider will then review and we will call you with those results. Important tips:  Avoid showering during the first 24 hours of wearing the monitor. Avoid excessive sweating to help maximize wear time. Do not submerge the device, no hot tubs, and no swimming pools. Keep any lotions or oils away from the patch. After 24 hours you may shower with the patch on. Take brief showers with your back facing the shower head.  Do not remove patch once it has been placed because that will interrupt data and decrease adhesive wear time. Push the button when you have any symptoms and write down what you were feeling. Once you have completed wearing your monitor, remove and place into box which has postage paid and place in your outgoing mailbox.  If for some reason you have misplaced your box then call our office and we can provide another box and/or mail it off for you.    Follow-Up: At [facility], you and your health needs are our priority.  As part of our continuing mission to provide you with exceptional heart care, we have created designated Provider Care Teams.  These Care Teams include your primary Cardiologist (physician) and Advanced Practice Providers (APPs -  Physician Assistants and Nurse Practitioners) who all work together to provide you with the care you need, when you need it.  We recommend signing up for the patient portal called "MyChart".  Sign up information is provided on this After Visit Summary.  MyChart is used to connect with patients for Virtual Visits (Telemedicine).  Patients are able to view lab/test results, encounter notes, upcoming appointments, etc.  Non-urgent messages can be sent to your  provider as well.   To learn more about what you can do with MyChart, go to [email or web address].    Your next appointment:   6 week(s)  The format for your next appointment:   In Person  Provider:   You may see Kate Sable, MD or one of the following Advanced Practice Providers on your designated Care Team:   Murray Hodgkins, NP Christell Faith, PA-C Cadence Kathlen Mody, [place]     Signed, Kate Sable, MD  11/18/2021 4:29 PM    [place]

## 2021-11-18 NOTE — Patient Instructions (Addendum)
Medication Instructions:   Your physician has recommended you make the following change in your medication:    START taking Xarelto 15 MG once a day.  You can use up your Eliquis first and then start the Xarelto.   *If you need a refill on your cardiac medications before your next appointment, please call your pharmacy*   Lab Work:  Your physician recommends that you return for a FASTING lipid profile: Prior to your follow up.  - You will need to be fasting. Please do not have anything to eat or drink after midnight the morning you have the lab work. You may only have water or black coffee with no cream or sugar.   Please return to our office on ____________________at___________am/pm   Testing/Procedures:   Your physician has requested that you have an echocardiogram. Echocardiography is a painless test that uses sound waves to create images of your heart. It provides your doctor with information about the size and shape of your heart and how well your hearts chambers and valves are working. This procedure takes approximately one hour. There are no restrictions for this procedure.  2.    Your physician has recommended that you wear a Zio monitor for 2 weeks.   This monitor is a medical device that records the hearts electrical activity. Doctors most often use these monitors to diagnose arrhythmias. Arrhythmias are problems with the speed or rhythm of the heartbeat. The monitor is a small device applied to your chest. You can wear one while you do your normal daily activities. While wearing this monitor if you have any symptoms to push the button and record what you felt. Once you have worn this monitor for the period of time provider prescribed (Usually 14 days), you will return the monitor device in the postage paid box. Once it is returned they will download the data collected and provide [place] with a report which the provider will then review and we will call you with those results.  Important tips:  Avoid showering during the first 24 hours of wearing the monitor. Avoid excessive sweating to help maximize wear time. Do not submerge the device, no hot tubs, and no swimming pools. Keep any lotions or oils away from the patch. After 24 hours you may shower with the patch on. Take brief showers with your back facing the shower head.  Do not remove patch once it has been placed because that will interrupt data and decrease adhesive wear time. Push the button when you have any symptoms and write down what you were feeling. Once you have completed wearing your monitor, remove and place into box which has postage paid and place in your outgoing mailbox.  If for some reason you have misplaced your box then call our office and we can provide another box and/or mail it off for you.    Follow-Up: At [facility], you and your health needs are our priority.  As part of our continuing mission to provide you with exceptional heart care, we have created designated Provider Care Teams.  These Care Teams include your primary Cardiologist (physician) and Advanced Practice Providers (APPs -  Physician Assistants and Nurse Practitioners) who all work together to provide you with the care you need, when you need it.  We recommend signing up for the patient portal called "MyChart".  Sign up information is provided on this After Visit Summary.  MyChart is used to connect with patients for Virtual Visits (Telemedicine).  Patients are able  to view lab/test results, encounter notes, upcoming appointments, etc.  Non-urgent messages can be sent to your provider as well.   To learn more about what you can do with MyChart, go to [email or web address].    Your next appointment:   6 week(s)  The format for your next appointment:   In Person  Provider:   You may see Kate Sable, MD or one of the following Advanced Practice Providers on your designated Care Team:   Murray Hodgkins,  NP Christell Faith, PA-C Cadence Kathlen Mody, [place]

## 2021-11-20 ENCOUNTER — Other Ambulatory Visit: Payer: Self-pay

## 2021-11-20 ENCOUNTER — Other Ambulatory Visit (INDEPENDENT_AMBULATORY_CARE_PROVIDER_SITE_OTHER): Payer: Medicare Other

## 2021-11-20 DIAGNOSIS — Z13220 Encounter for screening for lipoid disorders: Secondary | ICD-10-CM | POA: Diagnosis not present

## 2021-11-21 DIAGNOSIS — I480 Paroxysmal atrial fibrillation: Secondary | ICD-10-CM | POA: Diagnosis not present

## 2021-11-21 LAB — LIPID PANEL
Chol/HDL Ratio: 4.9 ratio (ref 0.0–5.0)
Cholesterol, Total: 187 mg/dL (ref 100–199)
HDL: 38 mg/dL — ABNORMAL LOW (ref >39)
LDL Chol Calc (NIH): 117 mg/dL — ABNORMAL HIGH (ref 0–99)
Triglycerides: 178 mg/dL — ABNORMAL HIGH (ref 0–149)
VLDL Cholesterol Cal: 32 mg/dL (ref 5–40)

## 2021-11-22 ENCOUNTER — Telehealth: Payer: Self-pay | Admitting: Cardiology

## 2021-11-22 MED ORDER — ATORVASTATIN CALCIUM 40 MG PO TABS: 40.0000 mg | ORAL_TABLET | Freq: Every day | ORAL | 6 refills | Status: DC

## 2021-11-22 NOTE — Telephone Encounter (Signed)
[name] Sable, MD  11/22/2021 10:13 AM EST     Cholesterol elevated. Start lipitor 40mg  qd.

## 2021-11-22 NOTE — Telephone Encounter (Signed)
I spoke with the patient regarding his lipid panel results and Dr. Thereasa Solo recommendations to start: Atorvastatin 40 mg once daily   The patient voices understanding of his results and is agreeable with starting atorvastatin.  He would like the RX sent to [facility] for a # 30 day supply.  RX sent to the pharmacy.  The patient is aware to keep his follow up appointments as scheduled.

## 2021-11-27 ENCOUNTER — Other Ambulatory Visit (HOSPITAL_COMMUNITY): Payer: Self-pay

## 2021-11-27 ENCOUNTER — Telehealth (HOSPITAL_COMMUNITY): Payer: Self-pay | Admitting: Pharmacy Technician

## 2021-11-27 NOTE — Telephone Encounter (Signed)
Transitions of Care Pharmacy   Call attempted for a pharmacy transitions of care follow-up. Patient not available, wife said call back anytime tomorrow (11/28/2021)  Call attempt #1. Will follow-up in 2-3 days.   Parthenia Ames, PharmD

## 2021-11-28 ENCOUNTER — Telehealth (HOSPITAL_COMMUNITY): Payer: Self-pay | Admitting: Pharmacy Technician

## 2021-11-28 NOTE — Telephone Encounter (Signed)
Pharmacy Transitions of Care Follow-up Telephone Call  Date of discharge: 11/13/2021   Discharge Diagnosis: Afib  How have you been since you were released from the hospital?  Patient reports feeling fine since discharge, no concerns at this time.  Medication changes made at discharge:  - START: Eliquis 5 mg        Atorvastatin  Medication changes verified by the patient?  Yes  Medication Accessibility:  Home Pharmacy: [facility]  Was the patient provided with refills on discharged medications? No   Have all prescriptions been transferred from [facility] to home pharmacy? N/A   Is the patient able to afford medications? No Notable copays: Eliquis $47/month          Xarelto $47/month (test claim)                                  Warfarin $0 (test claim)  Medication Review:  Patient declined medication review. Per patient and wife, this was covered in the hospital and they are comfortable with the information and know the signs of bleeding.  Addendum: Patient saw Dr. Garen Lah on 11/18/2021 and expressed concern about cost of Eliquis. MD discontinued Eliquis and sent Xarelto to home pharmacy and will change to Coumadin if it is cost prohibitive.  Patient informed during call that the cost of Xarelto would be the same as Eliquis and Coumadin would be $0.  Follow-up Appointments:   [facility] f/u appt confirmed?  Scheduled to see Kate Sable on 12/31/2021 @ 9:20 am.   If their condition worsens, is the pt aware to call PCP or go to the Emergency Dept.? Yes  Final Patient Assessment:  -Pt is doing well.  -Pt verbalized understanding of Eliquis  -Pt has post discharge appointment  Parthenia Ames, PharmD

## 2021-12-10 ENCOUNTER — Telehealth: Payer: Self-pay | Admitting: Cardiology

## 2021-12-10 NOTE — Telephone Encounter (Signed)
Spoke with patients wife and informed her that the patient is to finish his Eliquis and then call his pharmacy for the Xarelto 15 MG prescription to be filled. The prescription was sent in at the time of the last OV. I instructed her to have him start the Xarelto the next day after his last dose of Eliquis. Patients wife also stated that they sent the ZIO monitor back last week and he is concerned because he has been feeling a lot of palpitations. I logged into [employer] and saw that the monitor has been received and the report is being processed. I informed her that I will call them as soon as we have received it and Dr. Garen Lah has had a chance to review it.  Patients wife verbalized understanding and agreed with plan.

## 2021-12-10 NOTE — Telephone Encounter (Signed)
Pt c/o medication issue:  1. Name of Medication: eliquis   2. How are you currently taking this medication (dosage and times per day)? Not noted   3. Are you having a reaction (difficulty breathing--STAT)? No   4. What is your medication issue? Wife noticed bottle says dc after 12/25/21 .  She remembers discussing change to new anticoag.  Please call to advise on med change

## 2021-12-20 ENCOUNTER — Other Ambulatory Visit: Payer: Self-pay

## 2021-12-20 ENCOUNTER — Ambulatory Visit (INDEPENDENT_AMBULATORY_CARE_PROVIDER_SITE_OTHER): Payer: PPO

## 2021-12-20 DIAGNOSIS — I480 Paroxysmal atrial fibrillation: Secondary | ICD-10-CM

## 2021-12-22 LAB — ECHOCARDIOGRAM COMPLETE
AR max vel: 4.18 cm2
AV Area VTI: 4.13 cm2
AV Area mean vel: 3.74 cm2
AV Mean grad: 6 mmHg
AV Peak grad: 6.2 mmHg
Ao pk vel: 1.24 m/s
Area-P 1/2: 3.48 cm2
Calc EF: 54.8 %
S' Lateral: 4.6 cm
Single Plane A2C EF: 53.4 %
Single Plane A4C EF: 56.2 %

## 2021-12-31 ENCOUNTER — Other Ambulatory Visit: Payer: Self-pay

## 2021-12-31 ENCOUNTER — Ambulatory Visit: Payer: PPO | Admitting: Cardiology

## 2021-12-31 ENCOUNTER — Encounter: Payer: Self-pay | Admitting: Cardiology

## 2021-12-31 VITALS — BP 140/70 | HR 61 | Ht 70.0 in | Wt 274.5 lb

## 2021-12-31 DIAGNOSIS — I4819 Other persistent atrial fibrillation: Secondary | ICD-10-CM

## 2021-12-31 DIAGNOSIS — I10 Essential (primary) hypertension: Secondary | ICD-10-CM

## 2021-12-31 NOTE — Progress Notes (Signed)
Electrophysiology Office Note:    Date:  01/01/2022   ID:  [name] [name], DOB 01-10-1961, MRN [id number]  PCP:  Gara Kroner, DO  [facility] Cardiologist:  Kate Sable, MD  [facility] Electrophysiologist:  Vickie Epley, MD   Referring MD: Kate Sable, MD   Chief Complaint: Atrial fibrillation  History of Present Illness:    [name] [name] is a 61 y.o. male who presents for an evaluation of atrial fibrillation at the request of Dr. Garen Lah. Their medical history includes hypertension, chronic diastolic heart failure, hyperlipidemia, persistent atrial fibrillation post cardioversion in 2018 and 2022, renal transplant.  The patient was seen by Dr. Garen Lah on December 31, 2021 after being cardioverted in the hospital.  He is on Xarelto for stroke prophylaxis.  He is with his wife today in clinic.  He tells me when he is in atrial fibrillation he knows that something is wrong but he does not feel lightheaded, short of breath or experience chest pain.  He is taking his blood thinner without bleeding issues.  His most recent episode of A. fib was in December 2022 for which she was cardioverted in the emergency department.  His prior episode was in 2018.  He did wear a heart monitor which showed paroxysms of atrial tachycardia but no atrial fibrillation.  He is working on weight loss.  He does not snore and has no irregular breathing patterns at night.     Past Medical History:  Diagnosis Date   Arthritis    Cataract    bilateral- right removed, left present   Chronic diastolic CHF (congestive heart failure) (HCC)    Complication of anesthesia    problem with bladder not waking up after surgery-for right nephrectomy   Dysrhythmia    Enlarged kidney    right   ESRD (end stage renal disease) on dialysis (HCC)    Gout    Hyperlipidemia    Hypertension    PAF (paroxysmal atrial fibrillation) ([place])    happens about every 6 years-sees nishan    Renal insufficiency    KIDNEY DISEASE - FOLLOWED BY DR. Lorrene Reid NOTES ON CHART   Vertigo     Past Surgical History:  Procedure Laterality Date   AV FISTULA PLACEMENT     "IT CLOSED UP AND DOES NOT WORK"   AV FISTULA PLACEMENT Right 10/27/2014   Procedure: ARTERIOVENOUS (AV) FISTULA CREATION;  Surgeon: Conrad Diamond, MD;  Location: [place];  Service: Vascular;  Laterality: Right;   AV FISTULA PLACEMENT Right 08/22/2015   Procedure: BRACHIOCEPHALIC ARTERIOVENOUS (AV) FISTULA CREATION;  Surgeon: Conrad [place], MD;  Location: [place];  Service: Vascular;  Laterality: Right;   CATARACTS REMOVED Right    CHOLECYSTECTOMY N/A 07/13/2015   Procedure: LAPAROSCOPIC CHOLECYSTECTOMY WITH INTRAOPERATIVE CHOLANGIOGRAM;  Surgeon: Johnathan Hausen, MD;  [facility];  Service: General;  Laterality: N/A;   EYE SURGERY     left cataract surgery-lens implant   HERNIA REPAIR     INSERTION OF MESH N/A 06/20/2014   Procedure: INSERTION OF MESH;  Surgeon: Pedro Earls, MD;  [facility];  Service: General;  Laterality: N/A;   KNEE SURGERY  2001   LAPAROSCOPIC GASTRIC BANDING N/A 06/20/2014   Procedure: LAPAROSCOPIC GASTRIC BANDING and VENTRAL HERNIA REPAIR WITH MESH;  Surgeon: Pedro Earls, MD;  [facility];  Service: General;  Laterality: N/A;   TOTAL NEPHRECTOMY Right     Current Medications: Current Meds  Medication Sig  atorvastatin (LIPITOR) 40 MG tablet Take 1 tablet (40 mg total) by mouth daily.   ENVARSUS XR 1 MG TB24 Take 6 mg by mouth daily.   magnesium oxide (MAG-OX) 400 MG tablet Take 400 mg by mouth in the morning and at bedtime.   metoprolol succinate (TOPROL-XL) 25 MG 24 hr tablet Take 25 mg by mouth daily.   mycophenolate (MYFORTIC) 180 MG EC tablet Take 540 mg by mouth 2 (two) times daily.   predniSONE (DELTASONE) 5 MG tablet Take 5 mg by mouth daily.   Rivaroxaban (XARELTO) 15 MG TABS tablet Take 1 tablet (15 mg total) by mouth daily with supper.    sulfamethoxazole-trimethoprim (BACTRIM) 400-80 MG tablet Take by mouth. Takes Monday, Wednesday and Friday.     Allergies:   Patient has no known allergies.   Social History   Socioeconomic History   Marital status: Married    Spouse name: Not on file   Number of children: 12   Years of education: 2   Highest education level: Not on file  Occupational History   Occupation: Retired  Tobacco Use   Smoking status: Never   Smokeless tobacco: Never  Vaping Use   Vaping Use: Never used  Substance and Sexual Activity   Alcohol use: No    Alcohol/week: 0.0 standard drinks   Drug use: No   Sexual activity: Not on file  Other Topics Concern   Not on file  Social History Narrative   Lives at home with his wife.   Right-handed.   No caffeine use.   Social Determinants of Health   Financial Resource Strain: Not on file  Food Insecurity: Not on file  Transportation Needs: Not on file  Physical Activity: Not on file  Stress: Not on file  Social Connections: Not on file     Family History: The patient's family history includes CAD in his unknown relative; Cancer in his father, sister, and sister; Heart disease in his mother; Hypertension in his mother. There is no history of Colon cancer, Rectal cancer, or Stomach cancer.  ROS:   Please see the history of present illness.    All other systems reviewed and are negative.  EKGs/Labs/Other Studies Reviewed:    The following studies were reviewed today:  December 20, 2021 echocardiogram Left ventricular function normal, 60% Right ventricular function normal Mild MR Mild dilation of the aortic root, 41 mm  December 11, 2021 0 2 NSVT. 34 SVT, longest lasting 13.5 seconds NSVT and PSVT noted at time of patient triggered episode 3.4% PVC burden   EKG:  The ekg ordered today demonstrates sinus rhythm.  No preexcitation.  Intervals normal.   Recent Labs: 11/13/2021: BUN 21; Creatinine, Ser 2.08; Hemoglobin 15.0; Magnesium  1.7; Platelets 150; Potassium 4.0; Sodium 136  Recent Lipid Panel    Component Value Date/Time   CHOL 187 11/20/2021 0809   TRIG 178 (H) 11/20/2021 0809   HDL 38 (L) 11/20/2021 0809   CHOLHDL 4.9 11/20/2021 0809   CHOLHDL 3.1 11/28/2016 0346   VLDL 11 11/28/2016 0346   LDLCALC 117 (H) 11/20/2021 0809    Physical Exam:    VS:  BP 124/74 (BP Location: Left Arm, Patient Position: Sitting, Cuff Size: Large)    Pulse 64    Ht 5\' 10"  (1.778 m)    Wt 275 lb (124.7 kg)    SpO2 97%    BMI 39.46 kg/m     Wt Readings from Last 3 Encounters:  01/01/22 275 lb (124.7  kg)  12/31/21 274 lb 8 oz (124.5 kg)  11/18/21 273 lb (123.8 kg)     GEN:  Well nourished, well developed in no acute distress.  Obese HEENT: Normal NECK: No JVD; No carotid bruits LYMPHATICS: No lymphadenopathy CARDIAC: RRR, no murmurs, rubs, gallops RESPIRATORY:  Clear to auscultation without rales, wheezing or rhonchi  ABDOMEN: Soft, non-tender, non-distended MUSCULOSKELETAL:  No edema; No deformity  SKIN: Warm and dry NEUROLOGIC:  Alert and oriented x 3 PSYCHIATRIC:  Normal affect       ASSESSMENT:    1. Persistent atrial fibrillation ([place])   2. Primary hypertension   3. Chronic diastolic heart failure (HCC)    PLAN:    In order of problems listed above:  #Persistent atrial fibrillation Very low burden of atrial fibrillation.  He had 2 sustained episodes requiring a cardioversion.  Given his low burden, I think a watchful waiting approach is appropriate.  We will plan to touch base in 6 months or sooner as needed.  If he experiences sustained episodes of atrial fibrillation I have instructed him to reach out to the atrial fibrillation clinic or my clinic to discuss neck steps.  He should present to the emergency department if he experiences chest pain, shortness of breath, syncope or near syncope.  He should continue taking Xarelto but move the dose to dinnertime with food. He should continue to work on weight  loss.  We discussed the link between weight and atrial fibrillation burden during today's clinic appointment.  #Hypertension At goal.  Continue current medications.   Follow-up 6 months with Dr. Garen Lah Follow-up with EP APP in 1 year     Medication Adjustments/Labs and Tests Ordered: Current medicines are reviewed at length with the patient today.  Concerns regarding medicines are outlined above.  No orders of the defined types were placed in this encounter.  No orders of the defined types were placed in this encounter.    Signed, Hilton Cork. Quentin Ore, MD, [facility], [facility] 01/01/2022 10:20 AM    Electrophysiology [facility]

## 2021-12-31 NOTE — Patient Instructions (Signed)
Medication Instructions:  Your physician recommends that you continue on your current medications as directed. Please refer to the Current Medication list given to you today.  *If you need a refill on your cardiac medications before your next appointment, please call your pharmacy*   Lab Work: None ordered If you have labs (blood work) drawn today and your tests are completely normal, you will receive your results only by: [place] (if you have MyChart) OR A paper copy in the mail If you have any lab test that is abnormal or we need to change your treatment, we will call you to review the results.   Testing/Procedures: None ordered   Follow-Up: At [facility], you and your health needs are our priority.  As part of our continuing mission to provide you with exceptional heart care, we have created designated Provider Care Teams.  These Care Teams include your primary Cardiologist (physician) and Advanced Practice Providers (APPs -  Physician Assistants and Nurse Practitioners) who all work together to provide you with the care you need, when you need it.  We recommend signing up for the patient portal called "MyChart".  Sign up information is provided on this After Visit Summary.  MyChart is used to connect with patients for Virtual Visits (Telemedicine).  Patients are able to view lab/test results, encounter notes, upcoming appointments, etc.  Non-urgent messages can be sent to your provider as well.   To learn more about what you can do with MyChart, go to [email or web address].    Your next appointment:   Your physician wants you to follow-up in: 6 months You will receive a reminder letter in the mail two months in advance. If you don't receive a letter, please call our office to schedule the follow-up appointment.   The format for your next appointment:   In Person  Provider:   You may see Kate Sable, MD or one of the following Advanced Practice Providers on your  designated Care Team:   Murray Hodgkins, NP Christell Faith, PA-C Cadence Kathlen Mody, PA-C{     Other Instructions N/A

## 2021-12-31 NOTE — Progress Notes (Signed)
Cardiology Office Note:    Date:  12/31/2021   ID:  [name] [name], DOB [birth year], MRN [id number]  PCP:  Gara Kroner, DO   [facility] Cardiologist:  Kate Sable, MD     Referring MD: No ref. provider found   Chief Complaint  Patient presents with   Other    6 wk f/u echo and zio v/o shoulder and head dull pain . Meds reviewed verbally with pt.    History of Present Illness:    [name] [name] is a 61 y.o. male with a hx of hypertension, hyperlipidemia, paroxysmal atrial fibrillation (DCCV 2018, 10/2021), ESRD previously on HD now s/p renal transplant 2019 presenting for hospital follow-up.  Previously seen for A. fib s/p DC cardioversion in the hospital.  Cardiac monitor was placed to evaluate recurrence of A. fib.  Echocardiogram ordered to evaluate cardiac function.  He states having occasional palpitations when he had the monitor.  Not sure if this was A. fib.  Denies dizziness, chest pain or syncope.  Prior notes Echo 12/2021 EF 60 to 65%, mild MR, moderately dilated LA. Previously seen by the practice in [place] in 2018 after being admitted to the hospital for A. fib RVR.  He required urgent cardioversion at that time.  Due to ESRD, not deemed a candidate for NOAC.  Started on Coumadin.  Echocardiogram 2017 EF 60 to 65%, mildly dilated LA.  left heart cath 2019 at [facility] left dominant, no significant CAD small nondominant RCA.  he states doing okay ever since his last cardioversion in 2018 until 5 days ago when he developed palpitations.  He was seen in the ED 11/13/2021 due to palpitations EKG showed atrial fibrillation with rapid ventricular response, he underwent DC cardioversion in the hospital successfully.  Started on Eliquis for stroke prophylaxis.  States cost for Eliquis is too high.  He denies having any history of TIA even though this was mentioned in the chart.  Past Medical History:  Diagnosis Date   Arthritis    Cataract     bilateral- right removed, left present   Chronic diastolic CHF (congestive heart failure) (HCC)    Complication of anesthesia    problem with bladder not waking up after surgery-for right nephrectomy   Dysrhythmia    Enlarged kidney    right   ESRD (end stage renal disease) on dialysis (HCC)    Gout    Hyperlipidemia    Hypertension    PAF (paroxysmal atrial fibrillation) ([place])    happens about every 6 years-sees nishan   Renal insufficiency    KIDNEY DISEASE - FOLLOWED BY DR. Lorrene Reid NOTES ON CHART   Vertigo     Past Surgical History:  Procedure Laterality Date   AV FISTULA PLACEMENT     "IT CLOSED UP AND DOES NOT WORK"   AV FISTULA PLACEMENT Right 10/27/2014   Procedure: ARTERIOVENOUS (AV) FISTULA CREATION;  Surgeon: Conrad Dana, MD;  Location: [place];  Service: Vascular;  Laterality: Right;   AV FISTULA PLACEMENT Right 08/22/2015   Procedure: BRACHIOCEPHALIC ARTERIOVENOUS (AV) FISTULA CREATION;  Surgeon: Conrad Sprague, MD;  Location: [place];  Service: Vascular;  Laterality: Right;   CATARACTS REMOVED Right    CHOLECYSTECTOMY N/A 07/13/2015   Procedure: LAPAROSCOPIC CHOLECYSTECTOMY WITH INTRAOPERATIVE CHOLANGIOGRAM;  Surgeon: Johnathan Hausen, MD;  [facility];  Service: General;  Laterality: N/A;   EYE SURGERY     left cataract surgery-lens implant   HERNIA REPAIR  INSERTION OF MESH N/A 06/20/2014   Procedure: INSERTION OF MESH;  Surgeon: Pedro Earls, MD;  [facility];  Service: General;  Laterality: N/A;   KNEE SURGERY  2001   LAPAROSCOPIC GASTRIC BANDING N/A 06/20/2014   Procedure: LAPAROSCOPIC GASTRIC BANDING and VENTRAL HERNIA REPAIR WITH MESH;  Surgeon: Pedro Earls, MD;  [facility];  Service: General;  Laterality: N/A;   TOTAL NEPHRECTOMY Right     Current Medications: Current Meds  Medication Sig   atorvastatin (LIPITOR) 40 MG tablet Take 1 tablet (40 mg total) by mouth daily.   calcitRIOL (ROCALTROL) 0.25 MCG capsule Take 0.25 mcg by mouth  daily.   ENVARSUS XR 1 MG TB24 Take 6 mg by mouth daily.   magnesium oxide (MAG-OX) 400 MG tablet Take 400 mg by mouth in the morning and at bedtime.   metoprolol succinate (TOPROL-XL) 25 MG 24 hr tablet Take 25 mg by mouth daily.   mycophenolate (MYFORTIC) 180 MG EC tablet Take 540 mg by mouth 2 (two) times daily.   predniSONE (DELTASONE) 5 MG tablet Take 5 mg by mouth daily.   Rivaroxaban (XARELTO) 15 MG TABS tablet Take 1 tablet (15 mg total) by mouth daily with supper.   Sulfamethoxazole-Trimethoprim (BACTRIM PO) Take by mouth. Takes Monday, Wednesday and Friday.     Allergies:   Patient has no known allergies.   Social History   Socioeconomic History   Marital status: Married    Spouse name: Not on file   Number of children: 12   Years of education: 2   Highest education level: Not on file  Occupational History   Occupation: Retired  Tobacco Use   Smoking status: Never   Smokeless tobacco: Never  Vaping Use   Vaping Use: Never used  Substance and Sexual Activity   Alcohol use: No    Alcohol/week: 0.0 standard drinks   Drug use: No   Sexual activity: Not on file  Other Topics Concern   Not on file  Social History Narrative   Lives at home with his wife.   Right-handed.   No caffeine use.   Social Determinants of Health   Financial Resource Strain: Not on file  Food Insecurity: Not on file  Transportation Needs: Not on file  Physical Activity: Not on file  Stress: Not on file  Social Connections: Not on file     Family History: The patient's family history includes CAD in his unknown relative; Cancer in his father, sister, and sister; Heart disease in his mother; Hypertension in his mother. There is no history of Colon cancer, Rectal cancer, or Stomach cancer.  ROS:   Please see the history of present illness.     All other systems reviewed and are negative.  EKGs/Labs/Other Studies Reviewed:    The following studies were reviewed today:   EKG:  EKG is   ordered today.  The ekg ordered today demonstrates normal sinus rhythm  Recent Labs: 11/13/2021: BUN 21; Creatinine, Ser 2.08; Hemoglobin 15.0; Magnesium 1.7; Platelets 150; Potassium 4.0; Sodium 136  Recent Lipid Panel    Component Value Date/Time   CHOL 187 11/20/2021 0809   TRIG 178 (H) 11/20/2021 0809   HDL 38 (L) 11/20/2021 0809   CHOLHDL 4.9 11/20/2021 0809   CHOLHDL 3.1 11/28/2016 0346   VLDL 11 11/28/2016 0346   LDLCALC 117 (H) 11/20/2021 0809     Risk Assessment/Calculations:        Physical Exam:    VS:  BP  140/70 (BP Location: Left Arm, Patient Position: Sitting, Cuff Size: Large)    Pulse 61    Ht 5\' 10"  (1.778 m)    Wt 274 lb 8 oz (124.5 kg)    SpO2 98%    BMI 39.39 kg/m     Wt Readings from Last 3 Encounters:  12/31/21 274 lb 8 oz (124.5 kg)  11/18/21 273 lb (123.8 kg)  11/13/21 270 lb (122.5 kg)     GEN:  Well nourished, well developed in no acute distress HEENT: Normal NECK: No JVD; No carotid bruits LYMPHATICS: No lymphadenopathy CARDIAC: RRR, no murmurs, rubs, gallops RESPIRATORY:  Clear to auscultation without rales, wheezing or rhonchi  ABDOMEN: Soft, non-tender, non-distended MUSCULOSKELETAL:  No edema; No deformity  SKIN: Warm and dry NEUROLOGIC:  Alert and oriented x 3 PSYCHIATRIC:  Normal affect   ASSESSMENT:    1. Persistent atrial fibrillation ([place])   2. Primary hypertension    PLAN:    In order of problems listed above:  Persistent atrial fibrillation s/p DC cardioversion 10/2021 (prior in 2018).  CHA2DS2-VASc score 1.  EF 60 to 65%, mild MR, mild aortic root dilatation, moderately dilated LA.  Continue Toprol-XL 25 mg daily, cont Xarelto 15 mg daily.  If Xarelto also cost prohibitive, plan to start Coumadin.  No A. fib noted on monitor 12/2021, occasional SVTs.  Continue Toprol-XL, heart rate 61.  Keep appointment with A. fib clinic/EP tomorrow. Hypertension, BP controlled.  Continue Toprol-XL.  Follow-up in 6 months.        Medication Adjustments/Labs and Tests Ordered: Current medicines are reviewed at length with the patient today.  Concerns regarding medicines are outlined above.  Orders Placed This Encounter  Procedures   EKG 12-Lead   No orders of the defined types were placed in this encounter.   Patient Instructions  Medication Instructions:  Your physician recommends that you continue on your current medications as directed. Please refer to the Current Medication list given to you today.  *If you need a refill on your cardiac medications before your next appointment, please call your pharmacy*   Lab Work: None ordered If you have labs (blood work) drawn today and your tests are completely normal, you will receive your results only by: [place] (if you have MyChart) OR A paper copy in the mail If you have any lab test that is abnormal or we need to change your treatment, we will call you to review the results.   Testing/Procedures: None ordered   Follow-Up: At [facility], you and your health needs are our priority.  As part of our continuing mission to provide you with exceptional heart care, we have created designated Provider Care Teams.  These Care Teams include your primary Cardiologist (physician) and Advanced Practice Providers (APPs -  Physician Assistants and Nurse Practitioners) who all work together to provide you with the care you need, when you need it.  We recommend signing up for the patient portal called "MyChart".  Sign up information is provided on this After Visit Summary.  MyChart is used to connect with patients for Virtual Visits (Telemedicine).  Patients are able to view lab/test results, encounter notes, upcoming appointments, etc.  Non-urgent messages can be sent to your provider as well.   To learn more about what you can do with MyChart, go to [email or web address].    Your next appointment:   Your physician wants you to follow-up in: 6 months You  will receive a reminder letter in the mail  two months in advance. If you don't receive a letter, please call our office to schedule the follow-up appointment.   The format for your next appointment:   In Person  Provider:   You may see Kate Sable, MD or one of the following Advanced Practice Providers on your designated Care Team:   Murray Hodgkins, NP Christell Faith, PA-C Cadence Kathlen Mody, [place]     Other Instructions N/A    Signed, Kate Sable, MD  12/31/2021 10:28 AM    [place]

## 2022-01-01 ENCOUNTER — Encounter: Payer: Self-pay | Admitting: Cardiology

## 2022-01-01 ENCOUNTER — Ambulatory Visit (INDEPENDENT_AMBULATORY_CARE_PROVIDER_SITE_OTHER): Payer: PPO | Admitting: Cardiology

## 2022-01-01 VITALS — BP 124/74 | HR 64 | Ht 70.0 in | Wt 275.0 lb

## 2022-01-01 DIAGNOSIS — I5032 Chronic diastolic (congestive) heart failure: Secondary | ICD-10-CM | POA: Diagnosis not present

## 2022-01-01 DIAGNOSIS — I4819 Other persistent atrial fibrillation: Secondary | ICD-10-CM | POA: Diagnosis not present

## 2022-01-01 DIAGNOSIS — I10 Essential (primary) hypertension: Secondary | ICD-10-CM | POA: Diagnosis not present

## 2022-01-01 NOTE — Patient Instructions (Addendum)
Medications: Take Xarelto with food going forward.  Your physician recommends that you continue on your current medications as directed. Please refer to the Current Medication list given to you today. *If you need a refill on your cardiac medications before your next appointment, please call your pharmacy*  Lab Work: None. If you have labs (blood work) drawn today and your tests are completely normal, you will receive your results only by: [place] (if you have MyChart) OR A paper copy in the mail If you have any lab test that is abnormal or we need to change your treatment, we will call you to review the results.  Testing/Procedures: None.  Follow-Up: At [facility], you and your health needs are our priority.  As part of our continuing mission to provide you with exceptional heart care, we have created designated Provider Care Teams.  These Care Teams include your primary Cardiologist (physician) and Advanced Practice Providers (APPs -  Physician Assistants and Nurse Practitioners) who all work together to provide you with the care you need, when you need it.  Your physician wants you to follow-up in: 12 months with  one of the following Advanced Practice Providers on your designated Care Team:    Ignacia Bayley, NP Christell Faith PA Cadence Kathlen Mody PA    You will receive a reminder letter in the mail two months in advance. If you don't receive a letter, please call our office to schedule the follow-up appointment.  We recommend signing up for the patient portal called "MyChart".  Sign up information is provided on this After Visit Summary.  MyChart is used to connect with patients for Virtual Visits (Telemedicine).  Patients are able to view lab/test results, encounter notes, upcoming appointments, etc.  Non-urgent messages can be sent to your provider as well.   To learn more about what you can do with MyChart, go to [email or web address].    Any Other Special Instructions Will Be  Listed Below (If Applicable).  AFIB CLINIC INFORMATION:  The AFib Clinic is located in the Heart and Vascular Specialty Clinics at [facility]. Parking instructions/directions: [address] (off [address]). When you pull in to Entrance C, there is an underground parking garage to your right. The code to enter the garage is ______. Take the elevators to the first floor. Follow the signs to the Heart and Vascular Specialty Clinics. You will see registration at the end of the hallway.  Phone number: [phone]

## 2022-04-17 ENCOUNTER — Telehealth: Payer: Self-pay

## 2022-04-17 ENCOUNTER — Telehealth: Payer: Self-pay | Admitting: *Deleted

## 2022-04-17 NOTE — Telephone Encounter (Signed)
Received a call from [place] requesting a refill for patients Xarelto. I gave a verbal order for a 1 year supply.

## 2022-04-17 NOTE — Telephone Encounter (Signed)
Xarelto '15mg'$  paper refill request received. Pt is 61 years old, weight-124.7kg, Crea-2.08 on 11/13/2021, last seen by Dr. Quentin Ore on 01/01/2022 & Dr. Garen Lah on 12/31/2021, Diagnosis-Afib, CrCl-64.95m/min; Dose is inappropriate based on dosing criteria; also, per phone note from today at 954am, by another RN the rx was ordered via verbal order for a year supply will send a message to RN that ordered since pt may need a dose change.

## 2022-04-19 ENCOUNTER — Emergency Department: Payer: PPO

## 2022-04-19 ENCOUNTER — Other Ambulatory Visit: Payer: Self-pay

## 2022-04-19 ENCOUNTER — Emergency Department
Admission: EM | Admit: 2022-04-19 | Discharge: 2022-04-19 | Disposition: A | Discharge status: Home / Self Care | Payer: PPO | Attending: Emergency Medicine | Admitting: Emergency Medicine

## 2022-04-19 DIAGNOSIS — Z7901 Long term (current) use of anticoagulants: Secondary | ICD-10-CM | POA: Insufficient documentation

## 2022-04-19 DIAGNOSIS — I4891 Unspecified atrial fibrillation: Secondary | ICD-10-CM | POA: Diagnosis not present

## 2022-04-19 DIAGNOSIS — R002 Palpitations: Secondary | ICD-10-CM | POA: Diagnosis present

## 2022-04-19 LAB — CBC
HCT: 45.2 % (ref 39.0–52.0)
Hemoglobin: 14.4 g/dL (ref 13.0–17.0)
MCH: 30 pg (ref 26.0–34.0)
MCHC: 31.9 g/dL (ref 30.0–36.0)
MCV: 94.2 fL (ref 80.0–100.0)
Platelets: 149 10*3/uL — ABNORMAL LOW (ref 150–400)
RBC: 4.8 MIL/uL (ref 4.22–5.81)
RDW: 13.6 % (ref 11.5–15.5)
WBC: 8.1 10*3/uL (ref 4.0–10.5)
nRBC: 0 % (ref 0.0–0.2)

## 2022-04-19 LAB — BASIC METABOLIC PANEL
Anion gap: 9 (ref 5–15)
BUN: 26 mg/dL — ABNORMAL HIGH (ref 8–23)
CO2: 24 mmol/L (ref 22–32)
Calcium: 9.5 mg/dL (ref 8.9–10.3)
Chloride: 105 mmol/L (ref 98–111)
Creatinine, Ser: 1.92 mg/dL — ABNORMAL HIGH (ref 0.61–1.24)
GFR, Estimated: 39 mL/min — ABNORMAL LOW (ref >60)
Glucose, Bld: 118 mg/dL — ABNORMAL HIGH (ref 70–99)
Potassium: 3.8 mmol/L (ref 3.5–5.1)
Sodium: 138 mmol/L (ref 135–145)

## 2022-04-19 LAB — PROTIME-INR
INR: 2.1 — ABNORMAL HIGH (ref 0.8–1.2)
Prothrombin Time: 23.3 seconds — ABNORMAL HIGH (ref 11.4–15.2)

## 2022-04-19 MED ORDER — SODIUM CHLORIDE 0.9 % IV BOLUS
1000.0000 mL | Freq: Once | INTRAVENOUS | Status: AC
  Administered 2022-04-19: 1000 mL via INTRAVENOUS

## 2022-04-19 MED ORDER — FENTANYL CITRATE PF 50 MCG/ML IJ SOSY
150.0000 ug | PREFILLED_SYRINGE | Freq: Once | INTRAMUSCULAR | Status: DC
  Filled 2022-04-19: qty 3

## 2022-04-19 MED ORDER — METOPROLOL TARTRATE 5 MG/5ML IV SOLN
5.0000 mg | Freq: Once | INTRAVENOUS | Status: AC
  Administered 2022-04-19: 5 mg via INTRAVENOUS
  Filled 2022-04-19: qty 5

## 2022-04-19 MED ORDER — FENTANYL CITRATE (PF) 100 MCG/2ML IJ SOLN
INTRAMUSCULAR | Status: AC | PRN
  Administered 2022-04-19: 100 ug via INTRAVENOUS

## 2022-04-19 MED ORDER — MIDAZOLAM HCL 2 MG/2ML IJ SOLN
6.0000 mg | Freq: Once | INTRAMUSCULAR | Status: DC
  Filled 2022-04-19: qty 6

## 2022-04-19 MED ORDER — MIDAZOLAM HCL 2 MG/2ML IJ SOLN
INTRAMUSCULAR | Status: AC | PRN
  Administered 2022-04-19: 4 mg via INTRAVENOUS

## 2022-04-19 NOTE — ED Provider Notes (Addendum)
[facility] Provider Note    Event Date/Time   First MD Initiated Contact with Patient 04/19/22 [phone]     (approximate)   History   Tachycardia   HPI  [name] [name] is a 61 y.o. male with history of paroxysmal atrial fibrillation on metoprolol and Xarelto who presents with palpitations, acute onset around 3 AM when the patient awoke from a bad dream.  They have persisted since then.  He denies any chest pain, shortness of breath, or lightheadedness, but just states that he is aware of his heart beating fast.  It feels similar to prior episodes of rapid atrial fibrillation for which he has had cardioversion in the past.  He took an extra dose of his Xarelto after this started.    Physical Exam   Triage Vital Signs: ED Triage Vitals  Enc Vitals Group     BP 04/19/22 0800 103/69     Pulse Rate 04/19/22 0758 (!) 102     Resp 04/19/22 0758 (!) 26     Temp 04/19/22 0758 98.1 F (36.7 C)     Temp Source 04/19/22 0758 Oral     SpO2 04/19/22 0758 100 %     Weight 04/19/22 0750 270 lb (122.5 kg)     Height 04/19/22 0750 '5\' 10"'$  (1.778 m)     Head Circumference --      Peak Flow --      Pain Score 04/19/22 0748 0     Pain Loc --      Pain Edu? --      Excl. in Power? --     Most recent vital signs: Vitals:   04/19/22 1030 04/19/22 1045  BP: 108/76 104/68  Pulse: 62 (!) 57  Resp: 20 17  Temp:    SpO2: 96% 97%     General: Awake, no distress.  CV:  Tachycardic, irregular rhythm.  Good peripheral perfusion.  Resp:  Normal effort.  Lungs CTA B. Abd:  No distention.  Other:  No peripheral edema.   ED Results / Procedures / Treatments   Labs (all labs ordered are listed, but only abnormal results are displayed) Labs Reviewed  BASIC METABOLIC PANEL - Abnormal; Notable for the following components:      Result Value   Glucose, Bld 118 (*)    BUN 26 (*)    Creatinine, Ser 1.92 (*)    GFR, Estimated 39 (*)    All other components within  normal limits  CBC - Abnormal; Notable for the following components:   Platelets 149 (*)    All other components within normal limits  PROTIME-INR - Abnormal; Notable for the following components:   Prothrombin Time 23.3 (*)    INR 2.1 (*)    All other components within normal limits     EKG  ED ECG REPORT I, Arta Silence, the attending physician, personally viewed and interpreted this ECG.  Date: 04/19/2022 EKG Time: 0750 Rate: 156 Rhythm: Atrial fibrillation with RVR QRS Axis: normal Intervals: normal ST/T Wave abnormalities: Nonspecific ST abnormalities Narrative Interpretation: Atrial fibrillation with RVR but no evidence of acute ischemia  ED ECG REPORT I, Arta Silence, the attending physician, personally viewed and interpreted this ECG.  Date: 04/19/2022 EKG Time: 0945 Rate: 64 Rhythm: normal sinus rhythm QRS Axis: normal Intervals: normal ST/T Wave abnormalities: normal Narrative Interpretation: no evidence of acute ischemia   RADIOLOGY  Chest x-ray: Radiology report indicates no acute abnormalities.  I am unable to view  the images for this study directly.  PROCEDURES:  Critical Care performed: Yes, see critical care procedure note(s)  .Sedation  Date/Time: 04/19/2022 10:15 AM Performed by: Arta Silence, MD Authorized by: Arta Silence, MD   Consent:    Consent obtained:  Written   Consent given by:  Patient   Risks discussed:  Prolonged hypoxia resulting in organ damage, dysrhythmia, prolonged sedation necessitating reversal, inadequate sedation, respiratory compromise necessitating ventilatory assistance and intubation and vomiting   Alternatives discussed:  Anxiolysis Universal protocol:    Immediately prior to procedure, a time out was called: yes   Indications:    Procedure performed:  Cardioversion   Procedure necessitating sedation performed by:  Physician performing sedation Pre-sedation assessment:    Time since  last food or drink:  6 hours   ASA classification: class 2 - patient with mild systemic disease     Mallampati score:  I - soft palate, uvula, fauces, pillars visible   Pre-sedation assessments completed and reviewed: airway patency, cardiovascular function, mental status, pain level and respiratory function   Immediate pre-procedure details:    Reassessment: Patient reassessed immediately prior to procedure     Verified: bag valve mask available, emergency equipment available, intubation equipment available, oxygen available and suction available   Procedure details (see MAR for exact dosages):    Preoxygenation:  Nasal cannula   Sedation:  Midazolam   Intended level of sedation: moderate (conscious sedation)   Analgesia:  Fentanyl   Intra-procedure monitoring:  Blood pressure monitoring, frequent LOC assessments, cardiac monitor, continuous pulse oximetry and frequent vital sign checks   Intra-procedure events: none     Total Provider sedation time (minutes):  10 Post-procedure details:    Attendance: Constant attendance by certified staff until patient recovered     Recovery: Patient returned to pre-procedure baseline     Patient is stable for discharge or admission: yes     Procedure completion:  Tolerated well, no immediate complications .Cardioversion  Date/Time: 04/19/2022 10:17 AM Performed by: Arta Silence, MD Authorized by: Arta Silence, MD   Consent:    Consent obtained:  Written   Consent given by:  Patient   Risks discussed:  Cutaneous burn, death, induced arrhythmia and pain   Alternatives discussed:  Rate-control medication Pre-procedure details:    Cardioversion basis:  Elective   Rhythm:  Atrial fibrillation   Electrode placement:  Anterior-posterior Patient sedated: Yes. Refer to sedation procedure documentation for details of sedation.  Attempt one:    Cardioversion mode:  Synchronous   Waveform:  Monophasic   Shock (Joules):  200   Shock  outcome:  Conversion to normal sinus rhythm Post-procedure details:    Patient status:  Alert   Patient tolerance of procedure:  Tolerated well, no immediate complications .Critical Care Performed by: Arta Silence, MD Authorized by: Arta Silence, MD   Critical care provider statement:    Critical care time (minutes):  30   Critical care was necessary to treat or prevent imminent or life-threatening deterioration of the following conditions:  Cardiac failure   Critical care was time spent personally by me on the following activities:  Development of treatment plan with patient or surrogate, discussions with consultants, evaluation of patient's response to treatment, examination of patient, ordering and review of laboratory studies, ordering and review of radiographic studies, ordering and performing treatments and interventions, pulse oximetry, re-evaluation of patient's condition, review of old charts and obtaining history from patient or surrogate   MEDICATIONS ORDERED IN ED: Medications  midazolam (VERSED) injection 6 mg (6 mg Intravenous Not Given 04/19/22 1017)  fentaNYL (SUBLIMAZE) injection 150 mcg (150 mcg Intravenous Not Given 04/19/22 1017)  metoprolol tartrate (LOPRESSOR) injection 5 mg (5 mg Intravenous Given 04/19/22 0827)  sodium chloride 0.9 % bolus 1,000 mL (0 mLs Intravenous Stopped 04/19/22 1017)  midazolam (VERSED) injection (4 mg Intravenous Given 04/19/22 0940)  fentaNYL (SUBLIMAZE) injection (100 mcg Intravenous Given 04/19/22 0941)     IMPRESSION / MDM / ASSESSMENT AND PLAN / ED COURSE  I reviewed the triage vital signs and the nursing notes.  61 year old male with PMH as noted above presents with palpitations since 3 AM with no other significant symptoms.  On exam the patient is overall well-appearing.  He is tachycardic to the 140s with an irregular rhythm consistent with atrial fibrillation with RVR.  Physical exam is otherwise unremarkable, except that  the blood pressure is borderline low.  EKG confirms atrial fibrillation with RVR.  I reviewed the past medical records.  The patient follows with Dr. Garen Lah and Dr. Quentin Ore of cardiology.  He was most recently seen for an episode of rapid atrial fibrillation last December at [facility] and had cardioversion at that time.  He is on metoprolol and Xarelto and states he has been compliant.  When he last saw electrophysiology earlier this year the plan was for expectant management.  Differential diagnosis includes, but is not limited to, atrial fibrillation with RVR, SVT, dehydration.  There is no evidence of sepsis or other noncardiac etiology of the tachycardia.  There is no clinical evidence of ACS.  We will give IV metoprolol, obtain labs, discuss with cardiology, and reassess.  The patient has no chest pain and there is no indication for cardiac enzymes.  The patient is on the cardiac monitor to evaluate for evidence of arrhythmia and/or significant heart rate changes.  ----------------------------------------- 11:03 AM on 04/19/2022 -----------------------------------------  The patient had no response to initial IV metoprolol.  Given the known onset of the atrial fibrillation and the fact that the patient is anticoagulated, I consulted Dr. Garen Lah from cardiology and discussed the case with him, specifically cardioversion.  He agrees with cardioverting the patient and advises that if cardioversion is successful the patient may be discharged home.  The patient agreed with this plan.  He was cardioverted successfully on the first attempt.  Sedation was performed with Versed and fentanyl.  The patient is now fully alert and oriented and back to his baseline.  I did consider whether the patient may require admission, however he remains in sinus rhythm with normal vital signs, no chest pain, and resolved atrial fibrillation.  He is stable for discharge at this time.  He will follow-up with  his cardiologist and EP physician.  Return precautions given, and he expresses understanding.   FINAL CLINICAL IMPRESSION(S) / ED DIAGNOSES   Final diagnoses:  Atrial fibrillation with RVR ([place])     Rx / DC Orders   ED Discharge Orders     None        Note:  This document was prepared using Dragon voice recognition software and may include unintentional dictation errors.    Arta Silence, MD 04/19/22 1106    Arta Silence, MD 04/19/22 1107    Arta Silence, MD 04/19/22 [phone]

## 2022-04-19 NOTE — ED Triage Notes (Addendum)
Pt states he is in afib- pt denies CP and minimal SHOB- pt states he just feels "miserable" and a has a strange feeling in his shoulder- pt has a hx of afib- pt is not on any medications for his afib but was instructed to take an extra xerelto when it happens so he took an extra at 0400

## 2022-04-19 NOTE — Sedation Documentation (Signed)
Cardioverted at 200j now

## 2022-04-19 NOTE — ED Notes (Signed)
Reviewed discharge instructions, follow-up care with patient. Patient verbalized understanding of all information reviewed. Patient stable, with no distress noted at this time.    

## 2022-04-19 NOTE — Discharge Instructions (Signed)
Follow-up with Dr. Garen Lah and/or Dr. Quentin Ore from cardiology.  Continue to take your Xarelto and metoprolol as prescribed.  Return to the ER immediately for new, worsening, or persistent severe palpitations, high heart rate, chest pain, difficulty breathing, or any other new or worsening symptoms that concern you.

## 2022-04-21 NOTE — Progress Notes (Unsigned)
Cardiology Office Note    Date:  04/23/2022   ID:  [name] [name], DOB 12/21/1960, MRN [id number]  PCP:  Gara Kroner, DO  Cardiologist:  Kate Sable, MD  Electrophysiologist:  Vickie Epley, MD   Chief Complaint: ED follow-up  History of Present Illness:   [name] [name] is a 61 y.o. male with history of persistent A-fib status post DCCV in 2008 and 40/9811, diastolic dysfunction, ESRD previously on HD now status post renal transplant in 2019, HTN, and HLD who presents for ED follow-up.  Echo in 2017 demonstrated an EF of 60 to 65%, mildly dilated left atrium.  He was admitted to the hospital in 2018 with A-fib with RVR and required urgent DCCV.  LHC in 2019 showed no significant CAD with a small nondominant RCA.  More recently, he was seen in the ED in 10/2021 with recurrent A-fib with RVR and underwent repeat DCCV.  Subsequent outpatient cardiac monitor to quantify A-fib burden demonstrated a predominant rhythm of sinus with an average rate of 61 bpm (range 39 to 218 bpm), 2 episodes of NSVT with the fastest interval lasting 5 beats with a maximum rate of 156 bpm and the longest interval lasting 13 beats.  34 episodes of SVT occurred with the fastest interval lasting 5 beats with a maximal rate of 218 bpm and the longest interval lasting 13.5 seconds.  No evidence of A-fib or flutter was identified.  Most recent echo from 12/20/2021 demonstrated an EF of 60 to 65%, no regional wall motion abnormalities, mild LVH, grade 2 diastolic dysfunction, normal RV systolic function and ventricular cavity size, moderately elevated PASP estimated at 48.6 mmHg, moderately dilated left atrium, mild mitral regurgitation, mild dilatation of the aortic root measuring 41 mm and borderline dilatation of the ascending aorta measuring 37 mm.  He was evaluated by EP on 01/01/2022 for further input on his persistent A-fib with recommendation for watchful waiting given overall low burden.  He was  most recently seen in the ED on 04/19/2022 with onset of palpitations and was confirmed to be back in A-fib with RVR.  He was awoken in the middle of the night after having a bad dream about a snake with subsequent tachypalpitations.  He underwent successful DCCV in the ED and was discharged to outpatient follow-up.  He comes in today accompanied by his wife and is doing well from a cardiac perspective.  He reports he was initially diagnosed with A-fib in the 1990s when his son was to.  He did not require DCCV until 2018, and not again thereafter until 10/2021.  He has now undergone his third DCCV on 04/19/2022.  No symptoms of angina or decompensation.  No dizziness, presyncope, or syncope.  No symptoms concerning for bleeding.  He is adherent to cardiac medications including anticoagulation, though does qualify for Xarelto 20 mg daily and is currently on 15 mg daily.  No symptoms concerning for sleep disordered breathing.   Labs independently reviewed: 04/19/2022 - INR 2.1 03/2022 - Hgb 14.4, PLT 149, potassium 3.8, BUN 26, serum creatinine 1.92, creatinine clearance 67.4  03/2022 - albumin 4.4, AST/ALT normal, magnesium 1.6, A1c 6.4, TC 100, TG 179, HDL 34, direct LDL 47  Past Medical History:  Diagnosis Date   Abnormal stress test 05/07/2018   Actinic keratoses 05/01/2020   Arthritis    Atrial fibrillation ([place]) 11/28/2016   Atrial fibrillation with RVR (HCC)    Cataract    bilateral- right removed, left present  Cervical radiculopathy 03/01/2020   Cherry angioma 05/01/2020   Chest pain syndrome 01/26/2020   Chronic diastolic CHF (congestive heart failure) (HCC)    Chronic kidney disease, stage V ([place]) 17/61/6073   Complication of anesthesia    problem with bladder not waking up after surgery-for right nephrectomy   Dermatofibroma 05/01/2020   Dry skin 05/01/2020   Dysrhythmia    Encounter for adequacy testing for dialysis ([place]) 09/18/2014   Encounter for therapeutic drug monitoring 12/05/2016    Enlarged kidney    right   ESRD on dialysis ([place]) 71/05/2693   Folliculitis 07/05/4626   GERD without esophagitis 12/05/2020   Gout    History of gout 01/30/2020   HLD (hyperlipidemia) 11/28/2016   Hypertension    Hypokalemia 11/28/2016   Hypomagnesemia 01/30/2020   Immunosuppression ([place]) 01/17/2019   Immunosuppressive management encounter following kidney transplant 01/25/2019   Lapband APS July 2015 06/20/2014   Left shoulder pain 06/14/2020   Mixed hyperlipidemia 11/13/2014   Morbid obesity BMI 42 01/04/2014   Multiple benign nevi 05/01/2020   Paroxysmal A-fib ([place]) 04/22/2022   Renal disorder    Renal failure 07/13/2015   Renal insufficiency    KIDNEY DISEASE - FOLLOWED BY DR. 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 12/24/2018   Serum total bilirubin elevated 01/30/2020   Skin tag 05/01/2020   Stage 3b chronic kidney disease ([place]) 01/30/2020   Status post gastric banding 06/20/2014   Status post laparoscopic cholecystectomy August 2016 07/13/2015   Status post living-donor kidney transplantation 01/17/2019   Sun-damaged skin 05/01/2020   TIA (transient ischemic attack) 07/14/2017   Vertigo     Past Surgical History:  Procedure Laterality Date   AV FISTULA PLACEMENT     "IT CLOSED UP AND DOES NOT WORK"   AV FISTULA PLACEMENT Right 10/27/2014   Procedure: ARTERIOVENOUS (AV) FISTULA CREATION;  Surgeon: Conrad Markesan, MD;  Location: [facility];  Service: Vascular;  Laterality: Right;   AV FISTULA PLACEMENT Right 08/22/2015   Procedure: BRACHIOCEPHALIC ARTERIOVENOUS (AV) FISTULA CREATION;  Surgeon: Conrad Berrysburg, MD;  Location: [facility];  Service: Vascular;  Laterality: Right;   CATARACTS REMOVED Right    CHOLECYSTECTOMY N/A 07/13/2015   Procedure: LAPAROSCOPIC CHOLECYSTECTOMY WITH INTRAOPERATIVE CHOLANGIOGRAM;  Surgeon: Johnathan Hausen, MD;   Location: [facility];  Service: General;  Laterality: N/A;   EYE SURGERY     left cataract surgery-lens implant   HERNIA REPAIR     INSERTION OF MESH N/A 06/20/2014   Procedure: INSERTION OF MESH;  Surgeon: Pedro Earls, MD;  Location: [facility];  Service: General;  Laterality: N/A;   KNEE SURGERY  2001   LAPAROSCOPIC GASTRIC BANDING N/A 06/20/2014   Procedure: LAPAROSCOPIC GASTRIC BANDING and VENTRAL HERNIA REPAIR WITH MESH;  Surgeon: Pedro Earls, MD;  Location: [facility];  Service: General;  Laterality: N/A;   TOTAL NEPHRECTOMY Right     Current Medications: Current Meds  Medication Sig   atorvastatin (LIPITOR) 40 MG tablet Take 40 mg by mouth daily.   calcitRIOL (ROCALTROL) 0.25 MCG capsule Take 0.25 mcg by mouth daily.   ENVARSUS XR 1 MG TB24 Take 6 mg by mouth daily.   magnesium oxide (MAG-OX) 400 MG tablet Take 400 mg by mouth in the morning and at bedtime.   metoprolol succinate (TOPROL-XL) 25 MG 24 hr tablet  Take 25 mg by mouth daily.   mycophenolate (MYFORTIC) 180 MG EC tablet Take 540 mg by mouth 2 (two) times daily.   omeprazole (PRILOSEC) 20 MG capsule Take 20 mg by mouth daily.   predniSONE (DELTASONE) 5 MG tablet Take 5 mg by mouth daily.   rivaroxaban (XARELTO) 20 MG TABS tablet Take 1 tablet (20 mg total) by mouth daily with supper.   sulfamethoxazole-trimethoprim (BACTRIM) 400-80 MG tablet Take by mouth. Takes Monday, Wednesday and Friday.   [DISCONTINUED] Rivaroxaban (XARELTO) 15 MG TABS tablet Take 1 tablet (15 mg total) by mouth daily with supper.    Allergies:   Patient has no known allergies.   Social History   Socioeconomic History   Marital status: Married    Spouse name: Not on file   Number of children: 12   Years of education: 2   Highest education level: Not on file  Occupational History   Occupation: Retired  Tobacco Use   Smoking status: Never   Smokeless tobacco: Never  Vaping Use   Vaping Use: Never used  Substance and Sexual Activity    Alcohol use: No    Alcohol/week: 0.0 standard drinks   Drug use: No   Sexual activity: Not on file  Other Topics Concern   Not on file  Social History Narrative   Lives at home with his wife.   Right-handed.   No caffeine use.   Social Determinants of Health   Financial Resource Strain: Not on file  Food Insecurity: Not on file  Transportation Needs: Not on file  Physical Activity: Not on file  Stress: Not on file  Social Connections: Not on file     Family History:  The patient's family history includes CAD in his unknown relative; Cancer in his father, sister, and sister; Heart disease in his mother; Hypertension in his mother. There is no history of Colon cancer, Rectal cancer, or Stomach cancer.  ROS:   12-point review of systems is negative unless otherwise noted in the HPI.   EKGs/Labs/Other Studies Reviewed:    Studies reviewed were summarized above. The additional studies were reviewed today:  2D echo 12/20/2021: 1. Left ventricular ejection fraction, by estimation, is 60 to 65%. The  left ventricle has normal function. The left ventricle has no regional  wall motion abnormalities. There is mild left ventricular hypertrophy.  Left ventricular diastolic parameters  are consistent with Grade II diastolic dysfunction (pseudonormalization).   2. Right ventricular systolic function is normal. The right ventricular  size is normal. There is moderately elevated pulmonary artery systolic  pressure. The estimated right ventricular systolic pressure is 70.3 mmHg.   3. Left atrial size was moderately dilated.   4. The mitral valve is normal in structure. Mild mitral valve  regurgitation. No evidence of mitral stenosis.   5. The aortic valve has an indeterminant number of cusps. Unable to  exclude bicuspid aortic valve. Aortic valve regurgitation is not  visualized. No aortic stenosis is present.   6. There is mild dilatation of the aortic root, measuring 41 mm. There is   borderline dilatation of the ascending aorta, measuring 37 mm.   7. The inferior vena cava is normal in size with greater than 50%  respiratory variability, suggesting right atrial pressure of 3 mmHg. __________  Elwyn Reach patch 10/2021: Patient had a min HR of 39 bpm, max HR of 218 bpm, and avg HR of 61 bpm. Predominant underlying rhythm was Sinus Rhythm. 2 Ventricular Tachycardia runs occurred, the  run with the fastest interval lasting 5 beats with a max rate of 156 bpm, the longest  lasting 13 beats with an avg rate of 129 bpm. 34 Supraventricular Tachycardia runs occurred, the run with the fastest interval lasting 5 beats with a max rate of 218 bpm, the longest lasting 13.5 secs with an avg rate of 100 bpm. Ventricular Tachycardia  and Supraventricular Tachycardia were detected within +/- 45 seconds of symptomatic patient event(s). Isolated SVEs were occasional (3.4%, 38227),    No atrial fibrillation or flutter was noted. __________  2D echo 11/28/2016: - Left ventricle: The cavity size was mildly dilated. Wall    thickness was normal. Systolic function was normal. The estimated    ejection fraction was in the range of 60% to 65%. Wall motion was    normal; there were no regional wall motion abnormalities. Left    ventricular diastolic function parameters were normal.  - Aortic valve: There was no stenosis.  - Aorta: Mildly dilated aortic root and ascending aorta. Aortic    root dimension: 39 mm (ED). Ascending aortic diameter: 37 mm (S).  - Mitral valve: There was trivial regurgitation.  - Left atrium: The atrium was mildly dilated.  - Right ventricle: The cavity size was normal. Systolic function    was normal.  - Pulmonary arteries: PA peak pressure: 33 mm Hg (S).  - Inferior vena cava: The vessel was normal in size. The    respirophasic diameter changes were in the normal range (>= 50%),    consistent with normal central venous pressure.   Impressions:   - Mildly dilated LV with  EF 60-65%. Normal diastolic function.    Normal RV size and systolic function. No significant valvular    abnormalities. __________  2D echo 10/06/2013: - Left ventricle: The cavity size was normal. Wall thickness    was increased in a pattern of mild LVH. Systolic function    was normal. The estimated ejection fraction was in the    range of 60% to 65%. Features are consistent with a    pseudonormal left ventricular filling pattern, with    concomitant abnormal relaxation and increased filling    pressure (grade 2 diastolic dysfunction).  - Mitral valve: Mild regurgitation.  - Left atrium: The atrium was mildly dilated.    EKG:  EKG is ordered today.  The EKG ordered today demonstrates NSR, 63 bpm, no acute ST-T changes  Recent Labs: 11/13/2021: Magnesium 1.7 04/19/2022: BUN 26; Creatinine, Ser 1.92; Hemoglobin 14.4; Platelets 149; Potassium 3.8; Sodium 138  Recent Lipid Panel    Component Value Date/Time   CHOL 187 11/20/2021 0809   TRIG 178 (H) 11/20/2021 0809   HDL 38 (L) 11/20/2021 0809   CHOLHDL 4.9 11/20/2021 0809   CHOLHDL 3.1 11/28/2016 0346   VLDL 11 11/28/2016 0346   LDLCALC 117 (H) 11/20/2021 0809    PHYSICAL EXAM:    VS:  BP 120/68   Pulse 63   Ht '5\' 10"'$  (1.778 m)   Wt 268 lb (121.6 kg)   SpO2 98%   BMI 38.45 kg/m   BMI: Body mass index is 38.45 kg/m.  Physical Exam Constitutional:      Appearance: He is well-developed.  HENT:     Head: Normocephalic and atraumatic.  Eyes:     General:        Right eye: No discharge.        Left eye: No discharge.  Cardiovascular:     Rate and Rhythm:  Normal rate and regular rhythm.     Pulses:          Posterior tibial pulses are 2+ on the right side and 2+ on the left side.     Heart sounds: Normal heart sounds, S1 normal and S2 normal. Heart sounds not distant. No midsystolic click and no opening snap. No murmur heard.   No friction rub.  Pulmonary:     Effort: Pulmonary effort is normal. No respiratory  distress.     Breath sounds: Normal breath sounds. No decreased breath sounds, wheezing or rales.  Chest:     Chest wall: No tenderness.  Abdominal:     General: There is no distension.     Palpations: Abdomen is soft.  Musculoskeletal:     Cervical back: Normal range of motion.     Right lower leg: No edema.     Left lower leg: No edema.  Skin:    General: Skin is warm and dry.     Nails: There is no clubbing.  Neurological:     Mental Status: He is alert and oriented to person, place, and time.  Psychiatric:        Speech: Speech normal.        Behavior: Behavior normal.        Thought Content: Thought content normal.        Judgment: Judgment normal.    Wt Readings from Last 3 Encounters:  04/23/22 268 lb (121.6 kg)  04/19/22 270 lb (122.5 kg)  01/01/22 275 lb (124.7 kg)     ASSESSMENT & PLAN:   Persistent A-fib: Maintaining sinus rhythm status post recent DCCV on 04/19/2022.  He remains on Toprol-XL.  He has now had 3 cardioversions.  Case was discussed with EP with recommendation to revisit with them for further discussion regarding A-fib ablation.  May benefit from antiarrhythmic such as flecainide.  With age and underlying renal dysfunction status post transplant, antiarrhythmic therapy such as amiodarone or Tikosyn are likely not great options.  CHA2DS2-VASc at least  1 (HTN).  Current creatinine clearance is 67.4, therefore we will increase rivaroxaban to 20 mg.  Recent CBC stable.  No symptoms concerning for bleeding.  Check TSH.  Could take an additional metoprolol for recurrent A-fib.  Dilated aortic root/ascending aorta: Prior echo from 10/2016 documented a trileaflet aortic valve.  Would recommend following up echo in 12 months, from date of last echo in 12/2021, to trend.  Given underlying history of ESRD status post renal transplant, would attempt to avoid CTA of the aorta in an effort to preserve renal function.  Optimal blood pressure control is encouraged.  HTN:  Blood pressure is well controlled in the office today.  He remains on Toprol-XL.  HLD: LDL 47.  He remains on atorvastatin 40 mg.  ESRD: Status post renal transplant.  Follow-up with nephrology and transplant team as directed.    Disposition: F/u with Dr. Garen Lah or an APP in 6 months, and EP as outlined above.   Medication Adjustments/Labs and Tests Ordered: Current medicines are reviewed at length with the patient today.  Concerns regarding medicines are outlined above. Medication changes, Labs and Tests ordered today are summarized above and listed in the Patient Instructions accessible in Encounters.   Signed, Christell Faith, PA-C 04/23/2022 9:42 AM     [facility] [address] [place], [place] [address] [phone]

## 2022-04-22 DIAGNOSIS — M1990 Unspecified osteoarthritis, unspecified site: Secondary | ICD-10-CM | POA: Insufficient documentation

## 2022-04-22 DIAGNOSIS — I480 Paroxysmal atrial fibrillation: Secondary | ICD-10-CM | POA: Insufficient documentation

## 2022-04-22 DIAGNOSIS — R42 Dizziness and giddiness: Secondary | ICD-10-CM | POA: Insufficient documentation

## 2022-04-22 DIAGNOSIS — N289 Disorder of kidney and ureter, unspecified: Secondary | ICD-10-CM | POA: Insufficient documentation

## 2022-04-22 DIAGNOSIS — H269 Unspecified cataract: Secondary | ICD-10-CM | POA: Insufficient documentation

## 2022-04-22 DIAGNOSIS — N2881 Hypertrophy of kidney: Secondary | ICD-10-CM | POA: Insufficient documentation

## 2022-04-22 DIAGNOSIS — I499 Cardiac arrhythmia, unspecified: Secondary | ICD-10-CM | POA: Insufficient documentation

## 2022-04-22 DIAGNOSIS — T8859XA Other complications of anesthesia, initial encounter: Secondary | ICD-10-CM | POA: Insufficient documentation

## 2022-04-22 HISTORY — DX: Disorder of kidney and ureter, unspecified: N28.9

## 2022-04-22 HISTORY — DX: Paroxysmal atrial fibrillation: I48.0

## 2022-04-23 ENCOUNTER — Ambulatory Visit: Payer: PPO | Admitting: Physician Assistant

## 2022-04-23 ENCOUNTER — Other Ambulatory Visit
Admission: RE | Admit: 2022-04-23 | Discharge: 2022-04-23 | Disposition: A | Discharge status: Home / Self Care | Payer: PPO | Source: Ambulatory Visit | Attending: Physician Assistant | Admitting: Physician Assistant

## 2022-04-23 ENCOUNTER — Encounter: Payer: Self-pay | Admitting: Physician Assistant

## 2022-04-23 VITALS — BP 120/68 | HR 63 | Ht 70.0 in | Wt 268.0 lb

## 2022-04-23 DIAGNOSIS — E782 Mixed hyperlipidemia: Secondary | ICD-10-CM | POA: Diagnosis not present

## 2022-04-23 DIAGNOSIS — I4819 Other persistent atrial fibrillation: Secondary | ICD-10-CM | POA: Insufficient documentation

## 2022-04-23 DIAGNOSIS — I77810 Thoracic aortic ectasia: Secondary | ICD-10-CM

## 2022-04-23 DIAGNOSIS — Z940 Kidney transplant status: Secondary | ICD-10-CM

## 2022-04-23 DIAGNOSIS — N186 End stage renal disease: Secondary | ICD-10-CM

## 2022-04-23 DIAGNOSIS — I10 Essential (primary) hypertension: Secondary | ICD-10-CM

## 2022-04-23 LAB — TSH: TSH: 1.715 u[IU]/mL (ref 0.350–4.500)

## 2022-04-23 MED ORDER — RIVAROXABAN 20 MG PO TABS: 20.0000 mg | ORAL_TABLET | Freq: Every day | ORAL | 3 refills | Status: DC

## 2022-04-23 NOTE — Patient Instructions (Addendum)
Medication Instructions:  Your physician has recommended you make the following change in your medication:   INCREASE Xarelto to 20 mg once daily with supper  Medication Samples have been provided to the patient.  Drug name: Xarelto        Strength: 20 mg         Qty: 4 bottles   LOT: '22MG'$ [id number]   Exp.Date: 03-25   *If you need a refill on your cardiac medications before your next appointment, please call your pharmacy*   Lab Work: TSH today. Go to [facility] entrance and check in at registration.   If you have labs (blood work) drawn today and your tests are completely normal, you will receive your results only by: [place] (if you have MyChart) OR A paper copy in the mail If you have any lab test that is abnormal or we need to change your treatment, we will call you to review the results.   Testing/Procedures: None   Follow-Up: At [facility], you and your health needs are our priority.  As part of our continuing mission to provide you with exceptional heart care, we have created designated Provider Care Teams.  These Care Teams include your primary Cardiologist (physician) and Advanced Practice Providers (APPs -  Physician Assistants and Nurse Practitioners) who all work together to provide you with the care you need, when you need it.  We recommend signing up for the patient portal called "MyChart".  Sign up information is provided on this After Visit Summary.  MyChart is used to connect with patients for Virtual Visits (Telemedicine).  Patients are able to view lab/test results, encounter notes, upcoming appointments, etc.  Non-urgent messages can be sent to your provider as well.   To learn more about what you can do with MyChart, go to [email or web address].    Your next appointment:   6 month(s)  The format for your next appointment:   In Person  Provider:   Kate Sable, MD or Christell Faith, PA-C    Other Instructions REFERRAL placed for EP  provider Dr. Quentin Ore.   Important Information About [place]

## 2022-04-23 NOTE — Telephone Encounter (Signed)
Followed up on the Xarelto dose on this pt and noted that the pt saw Christell Faith today and the Xarelto '20mg'$  dose has been changed to correct dose per dosing criteria.

## 2022-04-25 ENCOUNTER — Telehealth: Payer: Self-pay

## 2022-04-25 NOTE — Telephone Encounter (Signed)
-----   Message from Kate Sable, MD sent at 04/24/2022  4:55 PM EDT ----- Regarding: RE: Xarelto dose Ok to start / dose xarelto '25mg'$  daily ----- Message ----- From: Kavin Leech, RN Sent: 04/22/2022  11:35 AM EDT To: Kate Sable, MD Subject: Melton Alar: Xarelto dose                                ----- Message ----- From: Marcos Eke, RN Sent: 04/17/2022   4:50 PM EDT To: Kavin Leech, RN Subject: Xarelto dose                                   Stephani Police,  I received a Xarelto refill as well. I saw that you took care of the [place] via phone for a year supply. However, I just wanted to see if you could check with Dr. Garen Lah to ensure he wants the pt to remain on the '15mg'$  dose since the CrCl is greater than 30m/min (normally '20mg'$  per dosing criteria and '15mg'$  when less than 556mmin). His CrCl per last labs is 64.124min and per dosing criteria he should be on Xarelto '20mg'$ . Also, since he has Kidney disease this may be the reason he has the lower dose per Physicain preference but I didn't see that documented, therefore, just want to ensure we have him on the correct dose per his Physician.   Thanks,  CanEngineering geologist

## 2022-04-25 NOTE — Telephone Encounter (Signed)
Patient seen by Christell Faith and dose was increased to Xarelto 20 MG daily.  Correction on message from Dr. Launa Grill below. I clarified with him the dose was to read Xarelto 20 MG not Xarelto 25 MG.

## 2022-05-23 ENCOUNTER — Observation Stay
Admission: EM | Admit: 2022-05-23 | Discharge: 2022-05-24 | Disposition: A | Discharge status: Home / Self Care | Payer: PPO | Attending: Obstetrics and Gynecology | Admitting: Obstetrics and Gynecology

## 2022-05-23 ENCOUNTER — Encounter: Payer: Self-pay | Admitting: Emergency Medicine

## 2022-05-23 DIAGNOSIS — Z79899 Other long term (current) drug therapy: Secondary | ICD-10-CM | POA: Diagnosis not present

## 2022-05-23 DIAGNOSIS — I4891 Unspecified atrial fibrillation: Secondary | ICD-10-CM | POA: Diagnosis present

## 2022-05-23 DIAGNOSIS — Z940 Kidney transplant status: Secondary | ICD-10-CM

## 2022-05-23 DIAGNOSIS — M25512 Pain in left shoulder: Secondary | ICD-10-CM | POA: Diagnosis not present

## 2022-05-23 DIAGNOSIS — M109 Gout, unspecified: Secondary | ICD-10-CM | POA: Diagnosis present

## 2022-05-23 DIAGNOSIS — I480 Paroxysmal atrial fibrillation: Principal | ICD-10-CM | POA: Insufficient documentation

## 2022-05-23 DIAGNOSIS — Z9884 Bariatric surgery status: Secondary | ICD-10-CM | POA: Insufficient documentation

## 2022-05-23 DIAGNOSIS — I5032 Chronic diastolic (congestive) heart failure: Secondary | ICD-10-CM | POA: Diagnosis not present

## 2022-05-23 DIAGNOSIS — D849 Immunodeficiency, unspecified: Secondary | ICD-10-CM | POA: Diagnosis present

## 2022-05-23 DIAGNOSIS — N186 End stage renal disease: Secondary | ICD-10-CM | POA: Insufficient documentation

## 2022-05-23 DIAGNOSIS — Z992 Dependence on renal dialysis: Secondary | ICD-10-CM | POA: Diagnosis not present

## 2022-05-23 DIAGNOSIS — R002 Palpitations: Secondary | ICD-10-CM | POA: Diagnosis present

## 2022-05-23 DIAGNOSIS — I132 Hypertensive heart and chronic kidney disease with heart failure and with stage 5 chronic kidney disease, or end stage renal disease: Secondary | ICD-10-CM | POA: Insufficient documentation

## 2022-05-23 DIAGNOSIS — I10 Essential (primary) hypertension: Secondary | ICD-10-CM | POA: Diagnosis present

## 2022-05-23 DIAGNOSIS — Z8673 Personal history of transient ischemic attack (TIA), and cerebral infarction without residual deficits: Secondary | ICD-10-CM | POA: Insufficient documentation

## 2022-05-23 DIAGNOSIS — Z7901 Long term (current) use of anticoagulants: Secondary | ICD-10-CM | POA: Diagnosis not present

## 2022-05-23 DIAGNOSIS — E669 Obesity, unspecified: Secondary | ICD-10-CM | POA: Diagnosis present

## 2022-05-23 DIAGNOSIS — Z9225 Personal history of immunosupression therapy: Secondary | ICD-10-CM | POA: Insufficient documentation

## 2022-05-23 DIAGNOSIS — G459 Transient cerebral ischemic attack, unspecified: Secondary | ICD-10-CM | POA: Diagnosis present

## 2022-05-23 LAB — CBC WITH DIFFERENTIAL/PLATELET
Abs Immature Granulocytes: 0.09 10*3/uL — ABNORMAL HIGH (ref 0.00–0.07)
Basophils Absolute: 0 10*3/uL (ref 0.0–0.1)
Basophils Relative: 0 %
Eosinophils Absolute: 0.1 10*3/uL (ref 0.0–0.5)
Eosinophils Relative: 1 %
HCT: 46.1 % (ref 39.0–52.0)
Hemoglobin: 14.6 g/dL (ref 13.0–17.0)
Immature Granulocytes: 1 %
Lymphocytes Relative: 14 %
Lymphs Abs: 1 10*3/uL (ref 0.7–4.0)
MCH: 29.9 pg (ref 26.0–34.0)
MCHC: 31.7 g/dL (ref 30.0–36.0)
MCV: 94.3 fL (ref 80.0–100.0)
Monocytes Absolute: 0.7 10*3/uL (ref 0.1–1.0)
Monocytes Relative: 10 %
Neutro Abs: 5.3 10*3/uL (ref 1.7–7.7)
Neutrophils Relative %: 74 %
Platelets: 153 10*3/uL (ref 150–400)
RBC: 4.89 MIL/uL (ref 4.22–5.81)
RDW: 13.8 % (ref 11.5–15.5)
WBC: 7.1 10*3/uL (ref 4.0–10.5)
nRBC: 0 % (ref 0.0–0.2)

## 2022-05-23 LAB — BASIC METABOLIC PANEL
Anion gap: 8 (ref 5–15)
BUN: 22 mg/dL (ref 8–23)
CO2: 25 mmol/L (ref 22–32)
Calcium: 9.7 mg/dL (ref 8.9–10.3)
Chloride: 107 mmol/L (ref 98–111)
Creatinine, Ser: 2.01 mg/dL — ABNORMAL HIGH (ref 0.61–1.24)
GFR, Estimated: 37 mL/min — ABNORMAL LOW (ref >60)
Glucose, Bld: 117 mg/dL — ABNORMAL HIGH (ref 70–99)
Potassium: 4.4 mmol/L (ref 3.5–5.1)
Sodium: 140 mmol/L (ref 135–145)

## 2022-05-23 MED ORDER — DILTIAZEM HCL 25 MG/5ML IV SOLN
15.0000 mg | Freq: Once | INTRAVENOUS | Status: AC
  Administered 2022-05-23: 15 mg via INTRAVENOUS
  Filled 2022-05-23: qty 5

## 2022-05-23 MED ORDER — DILTIAZEM HCL-DEXTROSE 125-5 MG/125ML-% IV SOLN (PREMIX)
5.0000 mg/h | INTRAVENOUS | Status: DC
  Administered 2022-05-23: 5 mg/h via INTRAVENOUS
  Filled 2022-05-23: qty 125

## 2022-05-24 ENCOUNTER — Other Ambulatory Visit: Payer: Self-pay

## 2022-05-24 ENCOUNTER — Encounter: Payer: Self-pay | Admitting: Internal Medicine

## 2022-05-24 DIAGNOSIS — N186 End stage renal disease: Secondary | ICD-10-CM | POA: Diagnosis not present

## 2022-05-24 DIAGNOSIS — I4891 Unspecified atrial fibrillation: Secondary | ICD-10-CM | POA: Insufficient documentation

## 2022-05-24 LAB — TROPONIN I (HIGH SENSITIVITY)
Troponin I (High Sensitivity): 41 ng/L — ABNORMAL HIGH (ref <18)
Troponin I (High Sensitivity): 35 ng/L — ABNORMAL HIGH (ref <18)

## 2022-05-24 LAB — HIV ANTIBODY (ROUTINE TESTING W REFLEX): HIV Screen 4th Generation wRfx: NONREACTIVE

## 2022-05-24 LAB — BRAIN NATRIURETIC PEPTIDE: B Natriuretic Peptide: 238.8 pg/mL — ABNORMAL HIGH (ref 0.0–100.0)

## 2022-05-24 LAB — HEMOGLOBIN A1C
Hgb A1c MFr Bld: 5.8 % — ABNORMAL HIGH (ref 4.8–5.6)
Mean Plasma Glucose: 119.76 mg/dL

## 2022-05-24 MED ORDER — METOPROLOL SUCCINATE ER 50 MG PO TB24: 25.0000 mg | ORAL_TABLET | Freq: Every day | ORAL | Status: DC

## 2022-05-24 MED ORDER — RIVAROXABAN 20 MG PO TABS: 20.0000 mg | ORAL_TABLET | Freq: Every day | ORAL | Status: DC

## 2022-05-24 MED ORDER — PANTOPRAZOLE SODIUM 40 MG PO TBEC: 40.0000 mg | DELAYED_RELEASE_TABLET | Freq: Every day | ORAL | Status: DC

## 2022-05-24 MED ORDER — TACROLIMUS ER 4 MG PO TB24: 6.0000 mg | ORAL_TABLET | Freq: Every day | ORAL | Status: DC

## 2022-05-24 MED ORDER — MYCOPHENOLATE SODIUM 180 MG PO TBEC
540.0000 mg | DELAYED_RELEASE_TABLET | Freq: Two times a day (BID) | ORAL | Status: DC
  Filled 2022-05-24 (×2): qty 3

## 2022-05-24 MED ORDER — DILTIAZEM HCL-DEXTROSE 125-5 MG/125ML-% IV SOLN (PREMIX)
5.0000 mg/h | INTRAVENOUS | Status: DC
  Filled 2022-05-24: qty 125

## 2022-05-24 MED ORDER — DILTIAZEM HCL 25 MG/5ML IV SOLN
30.0000 mg | Freq: Once | INTRAVENOUS | Status: AC
  Administered 2022-05-24: 30 mg via INTRAVENOUS
  Filled 2022-05-24: qty 10

## 2022-05-24 MED ORDER — ACETAMINOPHEN 325 MG PO TABS: 650.0000 mg | ORAL_TABLET | Freq: Four times a day (QID) | ORAL | Status: DC | PRN

## 2022-05-24 MED ORDER — TACROLIMUS 1 MG PO CAPS
3.0000 mg | ORAL_CAPSULE | Freq: Two times a day (BID) | ORAL | Status: DC
  Filled 2022-05-24: qty 3

## 2022-05-24 MED ORDER — ACETAMINOPHEN 650 MG RE SUPP: 650.0000 mg | Freq: Four times a day (QID) | RECTAL | Status: DC | PRN

## 2022-05-24 MED ORDER — SODIUM CHLORIDE 0.9 % IV SOLN
250.0000 mL | INTRAVENOUS | Status: DC | PRN
Start: 2022-05-24 — End: 2022-05-24

## 2022-05-24 MED ORDER — SODIUM CHLORIDE 0.9% FLUSH: 3.0000 mL | INTRAVENOUS | Status: DC | PRN

## 2022-05-24 MED ORDER — CALCITRIOL 0.25 MCG PO CAPS
0.2500 ug | ORAL_CAPSULE | Freq: Every day | ORAL | Status: DC
  Filled 2022-05-24: qty 1

## 2022-05-24 MED ORDER — TACROLIMUS ER 1 MG PO TB24
6.0000 mg | ORAL_TABLET | Freq: Every day | ORAL | Status: DC
  Filled 2022-05-24 (×2): qty 6

## 2022-05-24 MED ORDER — SULFAMETHOXAZOLE-TRIMETHOPRIM 400-80 MG PO TABS
1.0000 | ORAL_TABLET | Freq: Two times a day (BID) | ORAL | Status: DC
  Filled 2022-05-24: qty 1

## 2022-05-24 MED ORDER — PREDNISONE 10 MG PO TABS: 5.0000 mg | ORAL_TABLET | Freq: Every day | ORAL | Status: DC

## 2022-05-24 MED ORDER — ONDANSETRON HCL 4 MG/2ML IJ SOLN: 4.0000 mg | Freq: Four times a day (QID) | INTRAMUSCULAR | Status: DC | PRN

## 2022-05-24 MED ORDER — SODIUM CHLORIDE 0.9% FLUSH
3.0000 mL | Freq: Two times a day (BID) | INTRAVENOUS | Status: DC
  Administered 2022-05-24: 3 mL via INTRAVENOUS

## 2022-05-24 MED ORDER — ATORVASTATIN CALCIUM 20 MG PO TABS: 40.0000 mg | ORAL_TABLET | Freq: Every day | ORAL | Status: DC

## 2022-05-24 MED ORDER — MAGNESIUM OXIDE -MG SUPPLEMENT 400 (240 MG) MG PO TABS: 400.0000 mg | ORAL_TABLET | Freq: Two times a day (BID) | ORAL | Status: DC

## 2022-05-24 MED ORDER — ONDANSETRON HCL 4 MG PO TABS: 4.0000 mg | ORAL_TABLET | Freq: Four times a day (QID) | ORAL | Status: DC | PRN

## 2022-05-24 NOTE — Assessment & Plan Note (Signed)
Hold metoprolol until diltiazem gtt.

## 2022-05-24 NOTE — Assessment & Plan Note (Signed)
Musculoskeletal pt states relieved with supine position.

## 2022-06-03 NOTE — Progress Notes (Unsigned)
Electrophysiology Office Follow up Visit Note:    Date:  06/04/2022   ID:  [name] [name], DOB Apr 08, 1961, MRN [id number]  PCP:  Gara Kroner, DO  [place] Cardiologist:  Kate Sable, MD  [facility] Electrophysiologist:  Vickie Epley, MD    Interval History:    [name] [name] is a 61 y.o. male who presents for a follow up visit. They were last seen in clinic January 01, 2022 for persistent atrial fibrillation.  He is on Xarelto for stroke prophylaxis.  At the last appointment we discussed him working on weight loss.  Since our last appointment, the patient presented to the emergency room on May 23, 2022 with atrial fibrillation.  In the ER, he was started on diltiazem.  He successfully converted to sinus rhythm.  He presents today to discuss rhythm control strategies for his atrial fibrillation.  He is with his wife today in clinic wife previously met.  He is very symptomatic when in atrial fibrillation and would like to discuss treatment options to prevent future recurrences.     Past Medical History:  Diagnosis Date   Abnormal stress test 05/07/2018   Actinic keratoses 05/01/2020   Arthritis    Atrial fibrillation ([place]) 11/28/2016   Atrial fibrillation with RVR (HCC)    Cataract    bilateral- right removed, left present   Cervical radiculopathy 03/01/2020   Cherry angioma 05/01/2020   Chest pain syndrome 01/26/2020   Chronic diastolic CHF (congestive heart failure) ([place])    Chronic kidney disease, stage V ([place]) 78/29/5621   Complication of anesthesia    problem with bladder not waking up after surgery-for right nephrectomy   Dermatofibroma 05/01/2020   Dry skin 05/01/2020   Dysrhythmia    Encounter for adequacy testing for dialysis ([place]) 09/18/2014   Encounter for therapeutic drug monitoring 12/05/2016   Enlarged kidney    right   ESRD on dialysis ([place]) 30/86/5784   Folliculitis 05/09/6294   GERD without esophagitis 12/05/2020   Gout    History of gout  01/30/2020   HLD (hyperlipidemia) 11/28/2016   Hypertension    Hypokalemia 11/28/2016   Hypomagnesemia 01/30/2020   Immunosuppression ([place]) 01/17/2019   Immunosuppressive management encounter following kidney transplant 01/25/2019   Lapband APS July 2015 06/20/2014   Left shoulder pain 06/14/2020   Mixed hyperlipidemia 11/13/2014   Morbid obesity BMI 42 01/04/2014   Multiple benign nevi 05/01/2020   Paroxysmal A-fib ([place]) 04/22/2022   Renal disorder    Renal failure 07/13/2015   Renal insufficiency    KIDNEY DISEASE - FOLLOWED BY DR. Lorrene Reid NOTES ON CHART   Renal insufficiency 04/22/2022   KIDNEY DISEASE - FOLLOWED BY DR. 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 12/24/2018   Serum total bilirubin elevated 01/30/2020   Skin tag 05/01/2020   Stage 3b chronic kidney disease ([place]) 01/30/2020   Status post gastric banding 06/20/2014   Status post laparoscopic cholecystectomy August 2016 07/13/2015   Status post living-donor kidney transplantation 01/17/2019   Sun-damaged skin 05/01/2020   TIA (transient ischemic attack) 07/14/2017   Vertigo     Past Surgical History:  Procedure Laterality Date   AV FISTULA PLACEMENT     "IT CLOSED UP AND DOES NOT WORK"   AV FISTULA PLACEMENT Right 10/27/2014   Procedure: ARTERIOVENOUS (AV) FISTULA CREATION;  Surgeon:  Conrad Muncy, MD;  Location: [facility];  Service: Vascular;  Laterality: Right;   AV FISTULA PLACEMENT Right 08/22/2015   Procedure: BRACHIOCEPHALIC ARTERIOVENOUS (AV) FISTULA CREATION;  Surgeon: Conrad [place], MD;  Location: [facility];  Service: Vascular;  Laterality: Right;   CATARACTS REMOVED Right    CHOLECYSTECTOMY N/A 07/13/2015   Procedure: LAPAROSCOPIC CHOLECYSTECTOMY WITH INTRAOPERATIVE CHOLANGIOGRAM;  Surgeon: Johnathan Hausen, MD;  Location: [facility];  Service: General;  Laterality: N/A;    EYE SURGERY     left cataract surgery-lens implant   HERNIA REPAIR     INSERTION OF MESH N/A 06/20/2014   Procedure: INSERTION OF MESH;  Surgeon: Pedro Earls, MD;  Location: [facility];  Service: General;  Laterality: N/A;   KNEE SURGERY  2001   LAPAROSCOPIC GASTRIC BANDING N/A 06/20/2014   Procedure: LAPAROSCOPIC GASTRIC BANDING and VENTRAL HERNIA REPAIR WITH MESH;  Surgeon: Pedro Earls, MD;  Location: [facility];  Service: General;  Laterality: N/A;   TOTAL NEPHRECTOMY Right     Current Medications: Current Meds  Medication Sig   atorvastatin (LIPITOR) 40 MG tablet Take 40 mg by mouth daily.   ENVARSUS XR 1 MG TB24 Take 6 mg by mouth daily.   magnesium oxide (MAG-OX) 400 MG tablet Take 400 mg by mouth in the morning and at bedtime.   metoprolol succinate (TOPROL-XL) 25 MG 24 hr tablet Take 25 mg by mouth daily.   mycophenolate (MYFORTIC) 180 MG EC tablet Take 540 mg by mouth 2 (two) times daily.   omeprazole (PRILOSEC) 20 MG capsule Take 20 mg by mouth as needed.   predniSONE (DELTASONE) 5 MG tablet Take 5 mg by mouth daily.   rivaroxaban (XARELTO) 20 MG TABS tablet Take 1 tablet (20 mg total) by mouth daily with supper.   sulfamethoxazole-trimethoprim (BACTRIM) 400-80 MG tablet Take by mouth. Takes Monday, Wednesday and Friday.     Allergies:   Patient has no known allergies.   Social History   Socioeconomic History   Marital status: Married    Spouse name: Not on file   Number of children: 12   Years of education: 2   Highest education level: Not on file  Occupational History   Occupation: Retired  Tobacco Use   Smoking status: Never   Smokeless tobacco: Never  Vaping Use   Vaping Use: Never used  Substance and Sexual Activity   Alcohol use: No    Alcohol/week: 0.0 standard drinks of alcohol   Drug use: No   Sexual activity: Not on file  Other Topics Concern   Not on file  Social History Narrative   Lives at home with his wife.   Right-handed.   No caffeine  use.   Social Determinants of Health   Financial Resource Strain: Not on file  Food Insecurity: Not on file  Transportation Needs: Not on file  Physical Activity: Not on file  Stress: Not on file  Social Connections: Not on file     Family History: The patient's family history includes CAD in an other family member; Cancer in his father, sister, and sister; Dementia in his mother; Heart disease in his mother; Hypertension in his mother. There is no history of Colon cancer, Rectal cancer, or Stomach cancer.  ROS:   Please see the history of present illness.    All other systems reviewed and are negative.  EKGs/Labs/Other Studies Reviewed:    The following studies were reviewed today:     Recent Labs: 11/13/2021: Magnesium  1.7 04/23/2022: TSH 1.715 05/23/2022: BUN 22; Creatinine, Ser 2.01; Hemoglobin 14.6; Platelets 153; Potassium 4.4; Sodium 140 05/24/2022: B Natriuretic Peptide 238.8  Recent Lipid Panel    Component Value Date/Time   CHOL 187 11/20/2021 0809   TRIG 178 (H) 11/20/2021 0809   HDL 38 (L) 11/20/2021 0809   CHOLHDL 4.9 11/20/2021 0809   CHOLHDL 3.1 11/28/2016 0346   VLDL 11 11/28/2016 0346   LDLCALC 117 (H) 11/20/2021 0809    Physical Exam:    VS:  BP 130/70 (BP Location: Left Arm, Patient Position: Sitting, Cuff Size: Normal)   Pulse (!) 57   Ht 5' 10" (1.778 m)   Wt 265 lb (120.2 kg)   SpO2 98%   BMI 38.02 kg/m     Wt Readings from Last 3 Encounters:  06/04/22 265 lb (120.2 kg)  05/23/22 268 lb 15.4 oz (122 kg)  04/23/22 268 lb (121.6 kg)     GEN:  Well nourished, well developed in no acute distress HEENT: Normal NECK: No JVD; No carotid bruits LYMPHATICS: No lymphadenopathy CARDIAC: RRR, no murmurs, rubs, gallops RESPIRATORY:  Clear to auscultation without rales, wheezing or rhonchi  ABDOMEN: Soft, non-tender, non-distended MUSCULOSKELETAL:  No edema; No deformity  SKIN: Warm and dry NEUROLOGIC:  Alert and oriented x 3 PSYCHIATRIC:   Normal affect        ASSESSMENT:    1. Persistent atrial fibrillation ([place])   2. Chronic diastolic CHF (congestive heart failure) ([place])   3. TIA (transient ischemic attack)    PLAN:    In order of problems listed above:  #Persistent atrial fibrillation Recurrent.  Becoming more frequent.  Antiarrhythmic drug options are good to be limited because of his medication regimen given his history of renal transplant.  Discussed treatment options today for his AF including antiarrhythmic drug therapy and ablation. Discussed risks, recovery and likelihood of success. Discussed potential need for repeat ablation procedures and antiarrhythmic drugs after an initial ablation. They wish to proceed with scheduling.  Risk, benefits, and alternatives to EP study and radiofrequency ablation for afib were also discussed in detail today. These risks include but are not limited to stroke, bleeding, vascular damage, tamponade, perforation, damage to the esophagus, lungs, and other structures, pulmonary vein stenosis, worsening renal function, and death. The patient understands these risk and wishes to proceed.  We will therefore proceed with catheter ablation at the next available time.  Carto, ICE, anesthesia are requested for the procedure.    Given his baseline creatinine and history of renal transplant, no CT scan prior to the procedure.  He will get a transesophageal echo the day before the procedure.   PVI+posterior wall  #TIA Continue Xarelto for stroke prophylaxis  #History of renal transplant Follow[facility] at [facility].  Continue tacrolimus, Bactrim, prednisone, Myfortic.       Medication Adjustments/Labs and Tests Ordered: Current medicines are reviewed at length with the patient today.  Concerns regarding medicines are outlined above.  No orders of the defined types were placed in this encounter.  No orders of the defined types were placed in this encounter.    Signed, Lars Mage, MD, [facility], [facility] 06/04/2022 10:10 AM    Electrophysiology [facility]

## 2022-06-04 ENCOUNTER — Ambulatory Visit: Payer: PPO | Admitting: Cardiology

## 2022-06-04 ENCOUNTER — Encounter: Payer: Self-pay | Admitting: Cardiology

## 2022-06-04 VITALS — BP 130/70 | HR 57 | Ht 70.0 in | Wt 265.0 lb

## 2022-06-04 DIAGNOSIS — I5032 Chronic diastolic (congestive) heart failure: Secondary | ICD-10-CM | POA: Diagnosis not present

## 2022-06-04 DIAGNOSIS — I4819 Other persistent atrial fibrillation: Secondary | ICD-10-CM

## 2022-06-04 DIAGNOSIS — G459 Transient cerebral ischemic attack, unspecified: Secondary | ICD-10-CM

## 2022-06-04 NOTE — Patient Instructions (Addendum)
Medications: Your physician recommends that you continue on your current medications as directed. Please refer to the Current Medication list given to you today. *If you need a refill on your cardiac medications before your next appointment, please call your pharmacy*  Lab Work: None. If you have labs (blood work) drawn today and your tests are completely normal, you will receive your results only by: [place] (if you have MyChart) OR A paper copy in the mail If you have any lab test that is abnormal or we need to change your treatment, we will call you to review the results.  Testing/Procedures: Your physician has requested that you have a TEE. During a TEE, sound waves are used to create images of your heart. It provides your doctor with information about the size and shape of your heart and how well your heart's chambers and valves are working. In this test, a transducer is attached to the end of a flexible tube that's guided down your throat and into your esophagus (the tube leading from you mouth to your stomach) to get a more detailed image of your heart. You are not awake for the procedure. Please see the instruction sheet given to you today. For further information please visit [email or web address].  Your physician has recommended that you have an ablation. Catheter ablation is a medical procedure used to treat some cardiac arrhythmias (irregular heartbeats). During catheter ablation, a long, thin, flexible tube is put into a blood vessel in your groin (upper thigh), or neck. This tube is called an ablation catheter. It is then guided to your heart through the blood vessel. Radio frequency waves destroy small areas of heart tissue where abnormal heartbeats may cause an arrhythmia to start. Please see the instruction sheet given to you today.   Follow-Up: At [facility], you and your health needs are our priority.  As part of our continuing mission to provide you with exceptional  heart care, we have created designated Provider Care Teams.  These Care Teams include your primary Cardiologist (physician) and Advanced Practice Providers (APPs -  Physician Assistants and Nurse Practitioners) who all work together to provide you with the care you need, when you need it.  Your physician wants you to follow-up in: Ablation date picked is Oct 26, TEE will be Oct 25 and pre op labs will be Oct 5. Otila Kluver RN will call you with Ablation and TEE instructions.  We recommend signing up for the patient portal called "MyChart".  Sign up information is provided on this After Visit Summary.  MyChart is used to connect with patients for Virtual Visits (Telemedicine).  Patients are able to view lab/test results, encounter notes, upcoming appointments, etc.  Non-urgent messages can be sent to your provider as well.   To learn more about what you can do with MyChart, go to [email or web address].    Any Other Special Instructions Will Be Listed Below (If Applicable).  Transesophageal Echocardiogram Transesophageal echocardiogram (TEE) is a test that uses sound waves to take pictures of your heart. TEE is done by passing a small probe attached to a flexible tube down the part of the body that moves food from your mouth to your stomach (esophagus). The pictures give clear images of your heart. This can help your doctor see if there are problems with your heart. Tell a doctor about: Any allergies you have. All medicines you are taking. This includes vitamins, herbs, eye drops, creams, and over-the-counter medicines. Any problems you or family members have had  with anesthetic medicines. Any blood disorders you have. Any surgeries you have had. Any medical conditions you have. Any swallowing problems. Whether you have or have had a blockage in the part of the body that moves food from your mouth to your stomach. Whether you are pregnant or may be pregnant. What are the risks? In general, this is a  safe procedure. But, problems may occur, such as: Damage to nearby structures or organs. A tear in the part of the body that moves food from your mouth to your stomach. Irregular heartbeat. Hoarse voice or trouble swallowing. Bleeding. What happens before the procedure? Medicines Ask your doctor about changing or stopping: Your normal medicines. Vitamins, herbs, and supplements. Over-the-counter medicines. Do not take aspirin or ibuprofen unless you are told to. General instructions Follow instructions from your doctor about what you cannot eat or drink. You will take out any dentures or dental retainers. Plan to have a responsible adult take you home from the hospital or clinic. Plan to have a responsible adult care for you for the time you are told after you leave the hospital or clinic. This is important. What happens during the procedure?  An IV will be put into one of your veins. You may be given: A sedative. This medicine helps you relax. A medicine to numb the back of your throat. This may be sprayed or gargled. Your blood pressure, heart rate, and breathing will be watched. You may be asked to lie on your left side. A bite block will be placed in your mouth. This keeps you from biting the tube. The tip of the probe will be placed into the back of your mouth. You will be asked to swallow. Your doctor will take pictures of your heart. The probe and bite block will be taken out after the test is done. The procedure may vary among doctors and hospitals. What can I expect after the procedure? You will be monitored until you leave the hospital or clinic. This includes checking your blood pressure, heart rate, breathing rate, and blood oxygen level. Your throat may feel sore and numb. This will get better over time. You will not be allowed to eat or drink until the numbness has gone away. It is common to have a sore throat for a day or two. It is up to you to get the results of  your procedure. Ask how to get your results when they are ready. Follow these instructions at home: If you were given a sedative during your procedure, do not drive or use machines until your doctor says that it is safe. Return to your normal activities when your doctor says that it is safe. Keep all follow-up visits. Summary TEE is a test that uses sound waves to take pictures of your heart. You will be given a medicine to help you relax. Do not drive or use machines until your doctor says that it is safe. This information is not intended to replace advice given to you by your health care provider. Make sure you discuss any questions you have with your health care provider. Document Revised: 07/31/2021 Document Reviewed: 07/10/2020 Elsevier Patient Education  [place].   Cardiac Ablation Cardiac ablation is a procedure to destroy (ablate) some heart tissue that is sending bad signals. These bad signals cause problems in heart rhythm. The heart has many areas that make these signals. If there are problems in these areas, they can make the heart beat in a way that  is not normal. Destroying some tissues can help make the heart rhythm normal. Tell your doctor about: Any allergies you have. All medicines you are taking. These include vitamins, herbs, eye drops, creams, and over-the-counter medicines. Any problems you or family members have had with medicines that make you fall asleep (anesthetics). Any blood disorders you have. Any surgeries you have had. Any medical conditions you have, such as kidney failure. Whether you are pregnant or may be pregnant. What are the risks? This is a safe procedure. But problems may occur, including: Infection. Bruising and bleeding. Bleeding into the chest. Stroke or blood clots. Damage to nearby areas of your body. Allergies to medicines or dyes. The need for a pacemaker if the normal system is damaged. Failure of the procedure to treat the  problem. What happens before the procedure? Medicines Ask your doctor about: Changing or stopping your normal medicines. This is important. Taking aspirin and ibuprofen. Do not take these medicines unless your doctor tells you to take them. Taking other medicines, vitamins, herbs, and supplements. General instructions Follow instructions from your doctor about what you cannot eat or drink. Plan to have someone take you home from the hospital or clinic. If you will be going home right after the procedure, plan to have someone with you for 24 hours. Ask your doctor what steps will be taken to prevent infection. What happens during the procedure?  An IV tube will be put into one of your veins. You will be given a medicine to help you relax. The skin on your neck or groin will be numbed. A cut (incision) will be made in your neck or groin. A needle will be put through your cut and into a large vein. A tube (catheter) will be put into the needle. The tube will be moved to your heart. Dye may be put through the tube. This helps your doctor see your heart. Small devices (electrodes) on the tube will send out signals. A type of energy will be used to destroy some heart tissue. The tube will be taken out. Pressure will be held on your cut. This helps stop bleeding. A bandage will be put over your cut. The exact procedure may vary among doctors and hospitals. What happens after the procedure? You will be watched until you leave the hospital or clinic. This includes checking your heart rate, breathing rate, oxygen, and blood pressure. Your cut will be watched for bleeding. You will need to lie still for a few hours. Do not drive for 24 hours or as long as your doctor tells you. Summary Cardiac ablation is a procedure to destroy some heart tissue. This is done to treat heart rhythm problems. Tell your doctor about any medical conditions you may have. Tell him or her about all medicines you are  taking to treat them. This is a safe procedure. But problems may occur. These include infection, bruising, bleeding, and damage to nearby areas of your body. Follow what your doctor tells you about food and drink. You may also be told to change or stop some of your medicines. After the procedure, do not drive for 24 hours or as long as your doctor tells you. This information is not intended to replace advice given to you by your health care provider. Make sure you discuss any questions you have with your health care provider. Document Revised: 10/20/2019 Document Reviewed: 10/20/2019 Elsevier Patient Education  [place].

## 2022-06-12 ENCOUNTER — Other Ambulatory Visit: Payer: Self-pay | Admitting: *Deleted

## 2022-06-13 MED ORDER — ATORVASTATIN CALCIUM 40 MG PO TABS: 40.0000 mg | ORAL_TABLET | Freq: Every day | ORAL | 3 refills | Status: DC

## 2022-07-16 ENCOUNTER — Other Ambulatory Visit: Payer: Self-pay

## 2022-07-16 ENCOUNTER — Telehealth: Payer: Self-pay | Admitting: Cardiology

## 2022-07-16 ENCOUNTER — Emergency Department
Admission: EM | Admit: 2022-07-16 | Discharge: 2022-07-16 | Disposition: A | Discharge status: Home / Self Care | Payer: PPO | Attending: Emergency Medicine | Admitting: Emergency Medicine

## 2022-07-16 ENCOUNTER — Encounter: Payer: Self-pay | Admitting: Emergency Medicine

## 2022-07-16 DIAGNOSIS — R002 Palpitations: Secondary | ICD-10-CM | POA: Diagnosis not present

## 2022-07-16 DIAGNOSIS — I4891 Unspecified atrial fibrillation: Secondary | ICD-10-CM | POA: Diagnosis present

## 2022-07-16 DIAGNOSIS — Z7901 Long term (current) use of anticoagulants: Secondary | ICD-10-CM | POA: Diagnosis not present

## 2022-07-16 DIAGNOSIS — R7989 Other specified abnormal findings of blood chemistry: Secondary | ICD-10-CM | POA: Diagnosis not present

## 2022-07-16 LAB — CBC WITH DIFFERENTIAL/PLATELET
Abs Immature Granulocytes: 0.07 10*3/uL (ref 0.00–0.07)
Basophils Absolute: 0 10*3/uL (ref 0.0–0.1)
Basophils Relative: 1 %
Eosinophils Absolute: 0 10*3/uL (ref 0.0–0.5)
Eosinophils Relative: 0 %
HCT: 45.8 % (ref 39.0–52.0)
Hemoglobin: 14.4 g/dL (ref 13.0–17.0)
Immature Granulocytes: 1 %
Lymphocytes Relative: 9 %
Lymphs Abs: 0.6 10*3/uL — ABNORMAL LOW (ref 0.7–4.0)
MCH: 29.8 pg (ref 26.0–34.0)
MCHC: 31.4 g/dL (ref 30.0–36.0)
MCV: 94.8 fL (ref 80.0–100.0)
Monocytes Absolute: 0.6 10*3/uL (ref 0.1–1.0)
Monocytes Relative: 8 %
Neutro Abs: 5.8 10*3/uL (ref 1.7–7.7)
Neutrophils Relative %: 81 %
Platelets: 148 10*3/uL — ABNORMAL LOW (ref 150–400)
RBC: 4.83 MIL/uL (ref 4.22–5.81)
RDW: 13.8 % (ref 11.5–15.5)
WBC: 7.1 10*3/uL (ref 4.0–10.5)
nRBC: 0 % (ref 0.0–0.2)

## 2022-07-16 LAB — TROPONIN I (HIGH SENSITIVITY): Troponin I (High Sensitivity): 32 ng/L — ABNORMAL HIGH (ref <18)

## 2022-07-16 LAB — BASIC METABOLIC PANEL
Anion gap: 6 (ref 5–15)
BUN: 24 mg/dL — ABNORMAL HIGH (ref 8–23)
CO2: 24 mmol/L (ref 22–32)
Calcium: 9.7 mg/dL (ref 8.9–10.3)
Chloride: 109 mmol/L (ref 98–111)
Creatinine, Ser: 1.81 mg/dL — ABNORMAL HIGH (ref 0.61–1.24)
GFR, Estimated: 41 mL/min — ABNORMAL LOW (ref >60)
Glucose, Bld: 147 mg/dL — ABNORMAL HIGH (ref 70–99)
Potassium: 4.3 mmol/L (ref 3.5–5.1)
Sodium: 139 mmol/L (ref 135–145)

## 2022-07-16 LAB — BRAIN NATRIURETIC PEPTIDE: B Natriuretic Peptide: 230.1 pg/mL — ABNORMAL HIGH (ref 0.0–100.0)

## 2022-07-16 MED ORDER — MIDAZOLAM HCL 2 MG/2ML IJ SOLN
4.0000 mg | Freq: Once | INTRAMUSCULAR | Status: AC
  Administered 2022-07-16: 4 mg via INTRAVENOUS
  Filled 2022-07-16: qty 4

## 2022-07-16 MED ORDER — FENTANYL CITRATE PF 50 MCG/ML IJ SOSY
100.0000 ug | PREFILLED_SYRINGE | Freq: Once | INTRAMUSCULAR | Status: AC
  Administered 2022-07-16: 50 ug via INTRAVENOUS
  Filled 2022-07-16: qty 2

## 2022-07-16 MED ORDER — METOPROLOL TARTRATE 5 MG/5ML IV SOLN
INTRAVENOUS | Status: AC
  Filled 2022-07-16: qty 10

## 2022-07-16 MED ORDER — METOPROLOL TARTRATE 5 MG/5ML IV SOLN
10.0000 mg | Freq: Once | INTRAVENOUS | Status: AC
  Administered 2022-07-16: 10 mg via INTRAVENOUS

## 2022-07-16 MED ORDER — METOPROLOL TARTRATE 5 MG/5ML IV SOLN
5.0000 mg | Freq: Once | INTRAVENOUS | Status: AC
  Administered 2022-07-16: 5 mg via INTRAVENOUS
  Filled 2022-07-16: qty 5

## 2022-07-16 NOTE — Telephone Encounter (Signed)
Patient c/o Palpitations:  High priority if patient c/o lightheadedness, shortness of breath, or chest pain  How long have you had palpitations/irregular HR/ Afib? Are you having the symptoms now? Yes  Are you currently experiencing lightheadedness, SOB or CP?  A little lightheadedeness  Do you have a history of afib (atrial fibrillation) or irregular heart rhythm? Yes  Have you checked your BP or HR? (document readings if available):        BP    HR 187-200  Are you experiencing any other symptoms?   Wife called stating patient is in afib and is lying down.

## 2022-07-16 NOTE — Telephone Encounter (Signed)
Spoke with patients wife. She stated that patient is extremely tired and is back in Afib since this morning. He took his Metoprolol around 9:30-10:00 this AM. She denies any SOB, just exhaustion. Patient was recently hospitalized 05/23/22 for Afib. Dr. Quentin Ore is scheduled to do an ablation on 09/25/22. She states the pulse oximeter is giving them HR readings of 187-200, and they were unable to get a BP as their cuff was giving them an error reading. I advised and instructed how to take a manual pulse, and they reported it was 31 beats per 15 secs (124 bpm). She is worried he will have to be hospitalized.   Will route to Dr. Mardene Speak nurse for recommendations.

## 2022-07-16 NOTE — ED Provider Notes (Addendum)
[facility] Provider Note    Event Date/Time   First MD Initiated Contact with Patient 07/16/22 1351     (approximate)   History   Atrial Fibrillation   HPI  [name] [name] is a 61 y.o. male with a history of atrial fibrillation on Xarelto who presents with acute onset of palpitations about 3 hours ago when he was getting leaves off the roof.  They have persisted since then.  He reports some mild discomfort going up into his shoulder but no chest pain.  He states he feels exactly like prior episodes of atrial fibrillation.  He denies any shortness of breath or lightheadedness but states that he just feels slightly unwell.    Physical Exam   Triage Vital Signs: ED Triage Vitals  Enc Vitals Group     BP 07/16/22 1349 (!) 150/92     Pulse Rate 07/16/22 1349 (!) 158     Resp 07/16/22 1349 18     Temp 07/16/22 1349 97.7 F (36.5 C)     Temp Source 07/16/22 1349 Oral     SpO2 07/16/22 1349 94 %     Weight 07/16/22 1349 265 lb (120.2 kg)     Height 07/16/22 1349 '5\' 10"'$  (1.778 m)     Head Circumference --      Peak Flow --      Pain Score 07/16/22 1348 0     Pain Loc --      Pain Edu? --      Excl. in [place]? --     Most recent vital signs: Vitals:   07/16/22 1531 07/16/22 1544  BP: 102/65 103/72  Pulse: 70 74  Resp: 18   Temp:    SpO2: 93%      General: Awake, no distress.  CV:  Tachycardic, irregular rhythm.  Good peripheral perfusion.  Resp:  Normal effort.  Lungs CTAB. Abd:  No distention.  Other:  No peripheral edema.   ED Results / Procedures / Treatments   Labs (all labs ordered are listed, but only abnormal results are displayed) Labs Reviewed  BASIC METABOLIC PANEL - Abnormal; Notable for the following components:      Result Value   Glucose, Bld 147 (*)    BUN 24 (*)    Creatinine, Ser 1.81 (*)    GFR, Estimated 41 (*)    All other components within normal limits  CBC WITH DIFFERENTIAL/PLATELET - Abnormal; Notable for  the following components:   Platelets 148 (*)    Lymphs Abs 0.6 (*)    All other components within normal limits  BRAIN NATRIURETIC PEPTIDE - Abnormal; Notable for the following components:   B Natriuretic Peptide 230.1 (*)    All other components within normal limits  TROPONIN I (HIGH SENSITIVITY) - Abnormal; Notable for the following components:   Troponin I (High Sensitivity) 32 (*)    All other components within normal limits     EKG  ED ECG REPORT I, Arta Silence, the attending physician, personally viewed and interpreted this ECG.  Date: 07/16/2022 EKG Time: 1348 Rate: 158 Rhythm: Atrial ablation with RVR QRS Axis: normal Intervals: normal ST/T Wave abnormalities: Nonspecific ST abnormality Narrative Interpretation: Atrial renal fibrillation with RVR  ED ECG REPORT I, Arta Silence, the attending physician, personally viewed and interpreted this ECG.  Date: 07/16/2022 EKG Time: 1524 Rate: 72 Rhythm: normal sinus rhythm QRS Axis: normal Intervals: normal ST/T Wave abnormalities: normal Narrative Interpretation: no evidence of acute ischemia  RADIOLOGY    PROCEDURES:  Critical Care performed: Yes, see critical care procedure note(s)  .Critical Care  Performed by: Arta Silence, MD Authorized by: Arta Silence, MD   Critical care provider statement:    Critical care time (minutes):  30   Critical care was necessary to treat or prevent imminent or life-threatening deterioration of the following conditions:  Cardiac failure and circulatory failure   Critical care was time spent personally by me on the following activities:  Development of treatment plan with patient or surrogate, discussions with consultants, evaluation of patient's response to treatment, examination of patient, ordering and review of laboratory studies, ordering and review of radiographic studies, ordering and performing treatments and interventions, pulse oximetry,  re-evaluation of patient's condition, review of old charts and obtaining history from patient or surrogate .Cardioversion  Date/Time: 07/16/2022 3:46 PM  Performed by: Arta Silence, MD Authorized by: Arta Silence, MD   Consent:    Consent obtained:  Written   Consent given by:  Patient   Risks discussed:  Death, induced arrhythmia and pain   Alternatives discussed:  Rate-control medication Pre-procedure details:    Cardioversion basis:  Elective   Rhythm:  Atrial fibrillation   Electrode placement:  Anterior-posterior Patient sedated: Yes. Refer to sedation procedure documentation for details of sedation.  Attempt one:    Cardioversion mode:  Synchronous   Waveform:  Biphasic   Shock (Joules):  200   Shock outcome:  Conversion to normal sinus rhythm Post-procedure details:    Patient status:  Alert   Patient tolerance of procedure:  Tolerated well, no immediate complications .Sedation  Date/Time: 07/16/2022 3:47 PM  Performed by: Arta Silence, MD Authorized by: Arta Silence, MD   Consent:    Consent obtained:  Written   Consent given by:  Patient   Risks discussed:  Prolonged hypoxia resulting in organ damage, dysrhythmia, prolonged sedation necessitating reversal, inadequate sedation and respiratory compromise necessitating ventilatory assistance and intubation   Alternatives discussed:  Anxiolysis and analgesia without sedation Universal protocol:    Immediately prior to procedure, a time out was called: yes   Indications:    Procedure performed:  Cardioversion   Procedure necessitating sedation performed by:  Physician performing sedation Pre-sedation assessment:    Time since last food or drink:  6 hours   ASA classification: class 2 - patient with mild systemic disease     Mallampati score:  I - soft palate, uvula, fauces, pillars visible   Pre-sedation assessments completed and reviewed: airway patency, cardiovascular function, mental  status, nausea/vomiting and respiratory function   Immediate pre-procedure details:    Reassessment: Patient reassessed immediately prior to procedure   Procedure details (see MAR for exact dosages):    Preoxygenation:  Nasal cannula   Sedation:  Midazolam   Intended level of sedation: moderate (conscious sedation)   Analgesia:  Fentanyl   Intra-procedure monitoring:  Blood pressure monitoring, continuous capnometry, cardiac monitor, continuous pulse oximetry, frequent vital sign checks and frequent LOC assessments   Intra-procedure events: none     Total Provider sedation time (minutes):  10 Post-procedure details:    Attendance: Constant attendance by certified staff until patient recovered     Recovery: Patient returned to pre-procedure baseline     Patient is stable for discharge or admission: yes     Procedure completion:  Tolerated well, no immediate complications     MEDICATIONS ORDERED IN ED: Medications  metoprolol tartrate (LOPRESSOR) injection 5 mg (5 mg Intravenous Given 07/16/22 1405)  metoprolol tartrate (LOPRESSOR) injection 10 mg ( Intravenous Not Given 07/16/22 1436)  midazolam (VERSED) injection 4 mg (4 mg Intravenous Given 07/16/22 1513)  fentaNYL (SUBLIMAZE) injection 100 mcg (50 mcg Intravenous Given 07/16/22 1513)     IMPRESSION / MDM / ASSESSMENT AND PLAN / ED COURSE  I reviewed the triage vital signs and the nursing notes.  61 year old male with PMH as noted above presents with rapid atrial fibrillation acute onset 3 hours ago and similar to prior episodes.  I reviewed the past medical records.  The patient follows with Dr. Garen Lah from cardiology and Dr. Quentin Ore from electrophysiology.  He was last seen on 7/5.  He is scheduled for ablation in October.  He has been cardioverted several times.  I saw this patient in the ED on 5/20 and cardioverted him.  Differential diagnosis includes, but is not limited to, atrial fibrillation, less likely other  tachydysrhythmia.  Patient's presentation is most consistent with acute presentation with potential threat to life or bodily function.  The patient is on the cardiac monitor to evaluate for evidence of arrhythmia and/or significant heart rate changes.  We will give IV metoprolol.  I will consult cardiology.  If he has no response to medication he may need a cardioversion today in the ED.  He took his Xarelto last night as normal and has been taking it consistently.  He took an extra dose of metoprolol today with no response.  Given that he is anticoagulated and has unknown onset, he is a good candidate for cardioversion.  ----------------------------------------- 2:51 PM on 07/16/2022 -----------------------------------------  There has been no improvement in his rate after 2 doses of IV metoprolol.  Blood pressure remains stable.  I consulted Dr. Burnis Kingfisher attending from cardiology and discussed the case with him.  He agrees with cardioversion in the ED and recommends that if accessible we increase the patient's Toprol XL to twice daily.  ----------------------------------------- 3:48 PM on 07/16/2022 -----------------------------------------  Cardioversion was successful.  The patient is in normal sinus rhythm.  Repeat EKG is nonischemic.  He is asymptomatic at this time.  He is fully alert after sedation.  Lab work-up is overall unremarkable.  Troponin is minimally elevated however this is consistent with patient's prior labs as well as with rapid atrial fibrillation.  CBC is normal.  BMP shows normal electrolytes.  At this time, the patient is stable for discharge home as per cardiology recommendations.  I counseled him and the wife on the results of the work-up, cardiology recommendations, and the plan of care.  Return precautions given, and he expresses understanding.  ----------------------------------------- 3:09 PM on 08/08/2022 -----------------------------------------   Addendum:  BNP obtained due to concern for possible acute CHF precipitated by atrial fibrillation with RVR.  Workup ultimately showed no signs of the patient developing CHF.     FINAL CLINICAL IMPRESSION(S) / ED DIAGNOSES   Final diagnoses:  Atrial fibrillation with RVR ([place])     Rx / DC Orders   ED Discharge Orders     None        Note:  This document was prepared using Dragon voice recognition software and may include unintentional dictation errors.    Arta Silence, MD 07/16/22 1552    Arta Silence, MD 07/16/22 1553    Arta Silence, MD 08/08/22 1510

## 2022-07-16 NOTE — Discharge Instructions (Signed)
Dr. Garen Lah recommends that you start taking the Toprol XL twice daily instead of once.    Return to the ER for new, worsening, or persistent palpitations, difficulty breathing, chest pain, weakness, or any other new or worsening symptoms that concern you.

## 2022-07-16 NOTE — Telephone Encounter (Signed)
Dr. Quentin Ore was in office for a half day and has already left. I spoke with Ignacia Bayley and upon reviewing patients chart, he recommended that the patient take an extra dose of his Metoprolol Succinate 25 MG now, and that he should go to the ED if he is continuing in Afib after dose.   Called patients wife back and reviewed recommendation. She stated that her husband agreed that he should go in to the ED now.   Patient and his wife were very grateful for the follow up.

## 2022-07-16 NOTE — ED Triage Notes (Signed)
Patient arrives ambulatory with wife c/o atrial fib for the past 3 hours. Patient states he was up on the roof blowing leaves when it started. Reports hx of cardioversion. Has ablation schedule for October. Took blood thinner this am. Denies any chest pain or shortness of breath. Took a metoprolol about 45 mins ago per cardiology.

## 2022-07-21 ENCOUNTER — Other Ambulatory Visit: Payer: Self-pay | Admitting: Physician Assistant

## 2022-07-29 ENCOUNTER — Telehealth: Payer: Self-pay | Admitting: Cardiology

## 2022-07-29 ENCOUNTER — Emergency Department
Admission: EM | Admit: 2022-07-29 | Discharge: 2022-07-29 | Disposition: A | Discharge status: Home / Self Care | Payer: PPO | Attending: Emergency Medicine | Admitting: Emergency Medicine

## 2022-07-29 ENCOUNTER — Emergency Department: Payer: PPO

## 2022-07-29 ENCOUNTER — Other Ambulatory Visit: Payer: Self-pay

## 2022-07-29 DIAGNOSIS — I4891 Unspecified atrial fibrillation: Secondary | ICD-10-CM | POA: Diagnosis not present

## 2022-07-29 DIAGNOSIS — I132 Hypertensive heart and chronic kidney disease with heart failure and with stage 5 chronic kidney disease, or end stage renal disease: Secondary | ICD-10-CM | POA: Insufficient documentation

## 2022-07-29 DIAGNOSIS — I5032 Chronic diastolic (congestive) heart failure: Secondary | ICD-10-CM | POA: Diagnosis not present

## 2022-07-29 DIAGNOSIS — R002 Palpitations: Secondary | ICD-10-CM | POA: Diagnosis present

## 2022-07-29 DIAGNOSIS — Z992 Dependence on renal dialysis: Secondary | ICD-10-CM | POA: Insufficient documentation

## 2022-07-29 DIAGNOSIS — N186 End stage renal disease: Secondary | ICD-10-CM | POA: Diagnosis not present

## 2022-07-29 NOTE — ED Notes (Signed)
Pt remained in NSR throughout time in room. Feels safe for DC.

## 2022-07-29 NOTE — ED Triage Notes (Signed)
Pt comes with c/o afib episode. Pt states he is scheduled for ablation in October. Pt states his afib has been happening several times over last few weeks. Pt denies any CP or dizziness.

## 2022-07-29 NOTE — ED Notes (Signed)
Pt taken to ED 18 and informed Nurse Lovena Le that pt was coming and would need line and labs

## 2022-07-29 NOTE — Telephone Encounter (Signed)
Spoke with pt spouse who reports pt is in Afib with HR 161.  Can not get a BP reading.  Took scheduled dose of metoprolol succinate at 7:30 am.  Per spouse pt has had AF for 30 yrs and has never converted with medication.  Wants to know if Dr. Quentin Ore could put in orders for DCCV so that pt does not have to wait in ED for 15 hours. Pt c/o palpitations, no energy and feels horrible.  Has not missed any doses of Xarelto.  Advised pt to report to the ED for evaluation.  Sent Dr. Quentin Ore and  Garen Lah, MD a secure chat requesting orders for DCCV per spouse request.

## 2022-07-29 NOTE — ED Notes (Signed)
Assuming care pt is NSR on monitor, not complaining of any pain, previous EKG obtained and given to MD indicating conversion from triage EKG noting A-Fib/ RVR. Call bell within reach.

## 2022-07-29 NOTE — ED Provider Notes (Signed)
[facility] Provider Note    Event Date/Time   First MD Initiated Contact with Patient 07/29/22 [phone]     (approximate)   History   Atrial Fibrillation   HPI  [name] [name] is a 61 y.o. male   with past medical history of renal transplant, diastolic heart failure, atrial fibrillation on anticoagulation who presents with an episode of palpitations.  Started around 7:30 AM.  Felt his heart was racing and he endorses generally feeling "yucky".  He denies any chest pain or shortness of breath.  Episode persisted until he came to the ED and is now resolved.  He is currently asymptomatic.  Denies any recent illnesses nausea vomiting diarrhea.  No fevers chills shortness of breath.  He is compliant with his metoprolol 25 mg XL which she takes twice daily.  Patient is scheduled for a ablation in October.     Past Medical History:  Diagnosis Date   Abnormal stress test 05/07/2018   Actinic keratoses 05/01/2020   Arthritis    Atrial fibrillation ([place]) 11/28/2016   Atrial fibrillation with RVR ([facility])    Cataract    bilateral- right removed, left present   Cervical radiculopathy 03/01/2020   Cherry angioma 05/01/2020   Chest pain syndrome 01/26/2020   Chronic diastolic CHF (congestive heart failure) ([place])    Chronic kidney disease, stage V ([place]) 51/88/4166   Complication of anesthesia    problem with bladder not waking up after surgery-for right nephrectomy   Dermatofibroma 05/01/2020   Dry skin 05/01/2020   Dysrhythmia    Encounter for adequacy testing for dialysis ([place]) 09/18/2014   Encounter for therapeutic drug monitoring 12/05/2016   Enlarged kidney    right   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   Folliculitis 0/08/3234   GERD without esophagitis 12/05/2020   Gout    History of gout 01/30/2020   HLD (hyperlipidemia) 11/28/2016   Hypertension    Hypokalemia 11/28/2016   Hypomagnesemia 01/30/2020   Immunosuppression ([place]) 01/17/2019   Immunosuppressive management  encounter following kidney transplant 01/25/2019   Lapband APS July 2015 06/20/2014   Left shoulder pain 06/14/2020   Mixed hyperlipidemia 11/13/2014   Morbid obesity BMI 42 01/04/2014   Multiple benign nevi 05/01/2020   Paroxysmal A-fib ([place]) 04/22/2022   Renal disorder    Renal failure 07/13/2015   Renal insufficiency    KIDNEY DISEASE - FOLLOWED BY DR. Lorrene Reid NOTES ON CHART   Renal insufficiency 04/22/2022   KIDNEY DISEASE - FOLLOWED BY DR. 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 12/24/2018   Serum total bilirubin elevated 01/30/2020   Skin tag 05/01/2020   Stage 3b chronic kidney disease ([place]) 01/30/2020   Status post gastric banding 06/20/2014   Status post laparoscopic cholecystectomy August 2016 07/13/2015   Status post living-donor kidney transplantation 01/17/2019   Sun-damaged skin 05/01/2020   TIA (transient ischemic attack) 07/14/2017   Vertigo     Patient Active Problem List   Diagnosis Date Noted   Atrial fibrillation with rapid ventricular response ([place]) 05/24/2022   Obesity (BMI 30-39.9) 05/23/2022   Paroxysmal A-fib ([place]) 04/22/2022   Arthritis 04/22/2022   Cataract 57/32/2025   Complication of anesthesia 04/22/2022   Dysrhythmia 04/22/2022   Enlarged kidney 04/22/2022   Vertigo 04/22/2022   GERD without esophagitis 12/05/2020   Left shoulder pain  06/14/2020   Actinic keratoses 05/01/2020   Cherry angioma 05/01/2020   Dermatofibroma 05/01/2020   Dry skin 75/64/3329   Folliculitis 51/88/4166   Multiple benign nevi 05/01/2020   Sebaceous hyperplasia 05/01/2020   Seborrheic keratoses 05/01/2020   Skin tag 05/01/2020   Sun-damaged skin 05/01/2020   Cervical radiculopathy 03/01/2020   History of gout 01/30/2020   Hypomagnesemia 01/30/2020   Serum total bilirubin elevated 01/30/2020   Stage 3b chronic  kidney disease ([place]) 01/30/2020   Chest pain syndrome 01/26/2020   Immunosuppressive management encounter following kidney transplant 01/25/2019   Immunosuppression ([place]) 01/17/2019   Status post living-donor kidney transplantation 01/17/2019   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 12/24/2018   Abnormal stress test 05/07/2018   TIA (transient ischemic attack) 07/14/2017   Encounter for therapeutic drug monitoring 12/05/2016   Hypokalemia 11/28/2016   HLD (hyperlipidemia) 11/28/2016   Atrial fibrillation ([place]) 11/28/2016   Chronic diastolic CHF (congestive heart failure) ([place])    Status post laparoscopic cholecystectomy August 2016 07/13/2015   Renal failure 07/13/2015   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   Mixed hyperlipidemia 11/13/2014   Encounter for adequacy testing for dialysis ([place]) 09/18/2014   S/P repair of ventral hernia 06/21/2014   Lapband APS July 2015 06/20/2014   Status post gastric banding 06/20/2014   Hypertension    Renal disorder    Gout    Atrial fibrillation with RVR [facility])      Physical Exam  Triage Vital Signs: ED Triage Vitals  Enc Vitals Group     BP 07/29/22 0923 (!) 135/109     Pulse Rate 07/29/22 0923 (!) 37     Resp 07/29/22 0923 18     Temp 07/29/22 0923 98.7 F (37.1 C)     Temp src --      SpO2 07/29/22 0923 94 %     Weight --      Height --      Head Circumference --      Peak Flow --      Pain Score 07/29/22 0922 0     Pain Loc --      Pain Edu? --      Excl. in [place]? --     Most recent vital signs: Vitals:   07/29/22 1100 07/29/22 1130  BP: 121/82 120/79  Pulse: 60 (!) 56  Resp: 15 14  Temp:    SpO2: 99% 100%     General: Awake, no distress.  CV:  Good peripheral perfusion.  No peripheral edema Resp:  Normal effort.  Lungs are clear Abd:  No distention.  Neuro:             Awake, Alert, Oriented x 3  Other:     ED Results / Procedures / Treatments  Labs (all labs ordered are listed, but only abnormal results  are displayed) Labs Reviewed  BASIC METABOLIC PANEL  CBC  PROTIME-INR     EKG  Reviewed and interpreted the first EKG which shows atrial fibrillation with RVR without acute ischemic changes  An EKG showing normal sinus rhythm normal axis normal intervals no acute schema changes   RADIOLOGY I reviewed and interpreted the CXR which does not show any acute cardiopulmonary process    PROCEDURES:  Critical Care performed: No  .1-3 Lead EKG Interpretation  Performed by: Rada Hay, MD Authorized by: Rada Hay, MD     Interpretation: normal     ECG rate assessment: normal     Rhythm: sinus rhythm  Ectopy: none     Conduction: normal     The patient is on the cardiac monitor to evaluate for evidence of arrhythmia and/or significant heart rate changes.   MEDICATIONS ORDERED IN ED: Medications - No data to display   IMPRESSION / MDM / [place] / ED COURSE  I reviewed the triage vital signs and the nursing notes.                              Patient's presentation is most consistent with acute presentation with potential threat to life or bodily function.  Differential diagnosis includes, but is not limited to, A-fib with RVR secondary to underlying chronic dysrhythmia, electrode abnormality, less likely acute coronary syndrome, pulmonary embolism, decompensated heart failure   The patient is a 61 year old male with history of underlying A-fib with multiple episodes of A-fib with RVR in the past requiring cardioversion in the ED presents today with an episode of palpitations.  Started around 7 AM he endorses feeling generally unwell during the episode but denies chest pain or shortness of breath.  No recent illnesses.  Has been compliant with his metoprolol and anticoagulation.  Took an extra metoprolol this morning.  His initial EKG shows A-fib RVR with rates in the 150s.  By the time my evaluation he is now back in normal sinus rhythm without  any treatment in the ED.  Heart rate in the 60s.  Vital signs otherwise unremarkable and he feels back to normal and essentially asymptomatic.  Labs were ordered from triage but patient requesting to avoid labs if not needed.  I think this is reasonable given he has not had any recent medication changes and is otherwise asymptomatic and no reason to believe that his electrolytes would be abnormal that he would be significantly anemic.   I spoke with Dr. Saunders Revel with cardiology who does not recommend making any additional changes to his medications.  Patient was observed in the ED for almost 3 hours and he remained in normal sinus rhythm.  He is appropriate for discharge at this time.   FINAL CLINICAL IMPRESSION(S) / ED DIAGNOSES   Final diagnoses:  Atrial fibrillation, unspecified type ([place])     Rx / DC Orders   ED Discharge Orders     None        Note:  This document was prepared using Dragon voice recognition software and may include unintentional dictation errors.   Rada Hay, MD 07/29/22 [phone]

## 2022-07-29 NOTE — Telephone Encounter (Signed)
Patient c/o Palpitations:  High priority if patient c/o lightheadedness, shortness of breath, or chest pain  How long have you had palpitations/irregular HR/ Afib? Are you having the symptoms now?  Yes  Are you currently experiencing lightheadedness, SOB or CP?  No  Do you have a history of afib (atrial fibrillation) or irregular heart rhythm? Yes  Have you checked your BP or HR? (document readings if available):  nO   Are you experiencing any other symptoms?     Wife called stating patient does feel well and his left shoulder is hurting.

## 2022-07-29 NOTE — Discharge Instructions (Signed)
Please continue to take your metoprolol as prescribed.  Please follow-up with your cardiologist in the next several days.

## 2022-08-01 ENCOUNTER — Telehealth: Payer: Self-pay | Admitting: Cardiology

## 2022-08-01 MED ORDER — METOPROLOL SUCCINATE ER 25 MG PO TB24: 25.0000 mg | ORAL_TABLET | Freq: Two times a day (BID) | ORAL | 6 refills | Status: DC

## 2022-08-01 NOTE — Telephone Encounter (Signed)
Spoke w/ pt's wife.   She reports that pt is s/p kidney transplant and cannot have any dye injected. She states that Dr. Quentin Ore had mentioned that pt would need to come in before ablation "to go in thru his esophagus and map it out beforehand", but admits she may have misunderstood. Advised her that I will make Dr. Quentin Ore aware of her concerns and call her back w/ his recommendation.  She is appreciative of the call.

## 2022-08-01 NOTE — Telephone Encounter (Signed)
Pt spouse called stating she was told that pt would an additional appt the day before his ablation because of pt kidney issue. Please advise.

## 2022-08-01 NOTE — Telephone Encounter (Signed)
Pt c/o medication issue:  1. Name of Medication: metoprolol succinate (TOPROL-XL) 25 MG 24 hr tablet  2. How are you currently taking this medication (dosage and times per day)? 25 mg 2x daily   3. Are you having a reaction (difficulty breathing--STAT)? No  4. What is your medication issue? Pt's wife states they need a new prescription called in since it was changed to 2 x's daily. They state they are running out of this medication faster.    [place], [place] [address]

## 2022-08-15 ENCOUNTER — Encounter: Payer: Self-pay | Admitting: *Deleted

## 2022-08-15 ENCOUNTER — Telehealth: Payer: Self-pay | Admitting: *Deleted

## 2022-08-15 DIAGNOSIS — I4819 Other persistent atrial fibrillation: Secondary | ICD-10-CM

## 2022-08-15 DIAGNOSIS — Z01818 Encounter for other preprocedural examination: Secondary | ICD-10-CM

## 2022-08-15 NOTE — Telephone Encounter (Signed)
TEE scheduled pre ablation.   Ablation work up completed.

## 2022-08-15 NOTE — Telephone Encounter (Addendum)
See procedure work up note.    In my last note.  Patient needs TEE the day before the ablation.  Otila Kluver, can you confirm this is ordered appropriately?  Thanks,  Lars Mage

## 2022-08-18 NOTE — Telephone Encounter (Signed)
See procedure work up note.

## 2022-08-22 NOTE — Telephone Encounter (Signed)
The patient's wife (DPR) answered the house phone. Went of procedure and TEE instructions with her and made sure she could find them in [place].  Verbalized understanding.

## 2022-08-24 ENCOUNTER — Emergency Department
Admission: EM | Admit: 2022-08-24 | Discharge: 2022-08-25 | Disposition: A | Discharge status: Home / Self Care | Payer: PPO | Attending: Emergency Medicine | Admitting: Emergency Medicine

## 2022-08-24 ENCOUNTER — Other Ambulatory Visit: Payer: Self-pay

## 2022-08-24 DIAGNOSIS — Z992 Dependence on renal dialysis: Secondary | ICD-10-CM | POA: Diagnosis not present

## 2022-08-24 DIAGNOSIS — I480 Paroxysmal atrial fibrillation: Secondary | ICD-10-CM | POA: Diagnosis not present

## 2022-08-24 DIAGNOSIS — W540XXA Bitten by dog, initial encounter: Secondary | ICD-10-CM | POA: Diagnosis not present

## 2022-08-24 DIAGNOSIS — S51812A Laceration without foreign body of left forearm, initial encounter: Secondary | ICD-10-CM | POA: Insufficient documentation

## 2022-08-24 DIAGNOSIS — I132 Hypertensive heart and chronic kidney disease with heart failure and with stage 5 chronic kidney disease, or end stage renal disease: Secondary | ICD-10-CM | POA: Diagnosis not present

## 2022-08-24 DIAGNOSIS — I5032 Chronic diastolic (congestive) heart failure: Secondary | ICD-10-CM | POA: Diagnosis not present

## 2022-08-24 DIAGNOSIS — N186 End stage renal disease: Secondary | ICD-10-CM | POA: Insufficient documentation

## 2022-08-24 DIAGNOSIS — Z7901 Long term (current) use of anticoagulants: Secondary | ICD-10-CM | POA: Diagnosis not present

## 2022-08-24 DIAGNOSIS — Z79899 Other long term (current) drug therapy: Secondary | ICD-10-CM | POA: Insufficient documentation

## 2022-08-24 DIAGNOSIS — S59912A Unspecified injury of left forearm, initial encounter: Secondary | ICD-10-CM | POA: Diagnosis present

## 2022-08-24 MED ORDER — AMOXICILLIN-POT CLAVULANATE 875-125 MG PO TABS: 1.0000 | ORAL_TABLET | Freq: Two times a day (BID) | ORAL | 0 refills | Status: DC

## 2022-08-24 MED ORDER — AMOXICILLIN-POT CLAVULANATE 875-125 MG PO TABS
1.0000 | ORAL_TABLET | Freq: Once | ORAL | Status: AC
  Administered 2022-08-25: 1 via ORAL
  Filled 2022-08-24: qty 1

## 2022-08-24 NOTE — ED Provider Notes (Signed)
[facility] Provider Note    Event Date/Time   First MD Initiated Contact with Patient 08/24/22 2338     (approximate)   History   Extremity Laceration   HPI  [name] [name] is a 61 y.o. male who presents to the ED from home with a chief complaint of dog bite.  Patient was playing with his rescue dog when the dog accidentally scratched patient's left forearm with his teeth.  Patient is immunosuppressed secondary to kidney transplant and is on Xarelto for atrial fibrillation.  States he washed the wound thoroughly at home prior to arrival.  Tetanus is up-to-date.  Voices no other complaints or injuries.  The dog's rabies vaccine is up-to-date.     Past Medical History   Past Medical History:  Diagnosis Date   Abnormal stress test 05/07/2018   Actinic keratoses 05/01/2020   Arthritis    Atrial fibrillation ([place]) 11/28/2016   Atrial fibrillation with RVR (HCC)    Cataract    bilateral- right removed, left present   Cervical radiculopathy 03/01/2020   Cherry angioma 05/01/2020   Chest pain syndrome 01/26/2020   Chronic diastolic CHF (congestive heart failure) ([place])    Chronic kidney disease, stage V ([place]) 10/93/2355   Complication of anesthesia    problem with bladder not waking up after surgery-for right nephrectomy   Dermatofibroma 05/01/2020   Dry skin 05/01/2020   Dysrhythmia    Encounter for adequacy testing for dialysis ([place]) 09/18/2014   Encounter for therapeutic drug monitoring 12/05/2016   Enlarged kidney    right   ESRD on dialysis ([place]) 73/22/0254   Folliculitis 01/07/622   GERD without esophagitis 12/05/2020   Gout    History of gout 01/30/2020   HLD (hyperlipidemia) 11/28/2016   Hypertension    Hypokalemia 11/28/2016   Hypomagnesemia 01/30/2020   Immunosuppression ([place]) 01/17/2019   Immunosuppressive management encounter following kidney transplant 01/25/2019   Lapband APS July 2015 06/20/2014   Left shoulder pain 06/14/2020   Mixed hyperlipidemia  11/13/2014   Morbid obesity BMI 42 01/04/2014   Multiple benign nevi 05/01/2020   Paroxysmal A-fib ([place]) 04/22/2022   Renal disorder    Renal failure 07/13/2015   Renal insufficiency    KIDNEY DISEASE - FOLLOWED BY DR. Lorrene Reid NOTES ON CHART   Renal insufficiency 04/22/2022   KIDNEY DISEASE - FOLLOWED BY DR. 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 12/24/2018   Serum total bilirubin elevated 01/30/2020   Skin tag 05/01/2020   Stage 3b chronic kidney disease ([place]) 01/30/2020   Status post gastric banding 06/20/2014   Status post laparoscopic cholecystectomy August 2016 07/13/2015   Status post living-donor kidney transplantation 01/17/2019   Sun-damaged skin 05/01/2020   TIA (transient ischemic attack) 07/14/2017   Vertigo      Active Problem List   Patient Active Problem List   Diagnosis Date Noted   Atrial fibrillation with rapid ventricular response ([place]) 05/24/2022   Obesity (BMI 30-39.9) 05/23/2022   Paroxysmal A-fib ([place]) 04/22/2022   Arthritis 04/22/2022   Cataract 76/28/3151   Complication of anesthesia 04/22/2022   Dysrhythmia 04/22/2022   Enlarged kidney 04/22/2022   Vertigo 04/22/2022   GERD without esophagitis 12/05/2020   Left shoulder pain 06/14/2020   Actinic keratoses 05/01/2020   Cherry angioma 05/01/2020   Dermatofibroma 05/01/2020   Dry  skin 78/58/8502   Folliculitis 77/41/2878   Multiple benign nevi 05/01/2020   Sebaceous hyperplasia 05/01/2020   Seborrheic keratoses 05/01/2020   Skin tag 05/01/2020   Sun-damaged skin 05/01/2020   Cervical radiculopathy 03/01/2020   History of gout 01/30/2020   Hypomagnesemia 01/30/2020   Serum total bilirubin elevated 01/30/2020   Stage 3b chronic kidney disease ([place]) 01/30/2020   Chest pain syndrome 01/26/2020   Immunosuppressive management encounter  following kidney transplant 01/25/2019   Immunosuppression ([place]) 01/17/2019   Status post living-donor kidney transplantation 01/17/2019   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 12/24/2018   Abnormal stress test 05/07/2018   TIA (transient ischemic attack) 07/14/2017   Encounter for therapeutic drug monitoring 12/05/2016   Hypokalemia 11/28/2016   HLD (hyperlipidemia) 11/28/2016   Atrial fibrillation ([place]) 11/28/2016   Chronic diastolic CHF (congestive heart failure) ([place])    Status post laparoscopic cholecystectomy August 2016 07/13/2015   Renal failure 07/13/2015   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   Mixed hyperlipidemia 11/13/2014   Encounter for adequacy testing for dialysis ([place]) 09/18/2014   S/P repair of ventral hernia 06/21/2014   Lapband APS July 2015 06/20/2014   Status post gastric banding 06/20/2014   Hypertension    Renal disorder    Gout    Atrial fibrillation with RVR ([place])      Past Surgical History   Past Surgical History:  Procedure Laterality Date   AV FISTULA PLACEMENT     "IT CLOSED UP AND DOES NOT WORK"   AV FISTULA PLACEMENT Right 10/27/2014   Procedure: ARTERIOVENOUS (AV) FISTULA CREATION;  Surgeon: Conrad Kenwood, MD;  Location: [place];  Service: Vascular;  Laterality: Right;   AV FISTULA PLACEMENT Right 08/22/2015   Procedure: BRACHIOCEPHALIC ARTERIOVENOUS (AV) FISTULA CREATION;  Surgeon: Conrad Buchtel, MD;  Location: [place];  Service: Vascular;  Laterality: Right;   CATARACTS REMOVED Right    CHOLECYSTECTOMY N/A 07/13/2015   Procedure: LAPAROSCOPIC CHOLECYSTECTOMY WITH INTRAOPERATIVE CHOLANGIOGRAM;  Surgeon: Johnathan Hausen, MD;  Location: [facility];  Service: General;  Laterality: N/A;   EYE SURGERY     left cataract surgery-lens implant   HERNIA REPAIR     INSERTION OF MESH N/A 06/20/2014   Procedure: INSERTION OF MESH;  Surgeon: Pedro Earls, MD;  Location: [facility];  Service: General;  Laterality: N/A;   KNEE SURGERY  2001   LAPAROSCOPIC  GASTRIC BANDING N/A 06/20/2014   Procedure: LAPAROSCOPIC GASTRIC BANDING and VENTRAL HERNIA REPAIR WITH MESH;  Surgeon: Pedro Earls, MD;  Location: [facility];  Service: General;  Laterality: N/A;   TOTAL NEPHRECTOMY Right      Home Medications   Prior to Admission medications   Medication Sig Start Date End Date Taking? Authorizing Provider  amoxicillin-clavulanate (AUGMENTIN) 875-125 MG tablet Take 1 tablet by mouth 2 (two) times daily. 08/24/22  Yes Paulette Blanch, MD  atorvastatin (LIPITOR) 40 MG tablet Take 1 tablet (40 mg total) by mouth daily. 07/21/22   Kate Sable, MD  ENVARSUS XR 1 MG TB24 Take 6 mg by mouth daily. 10/31/21   [provider]  magnesium oxide (MAG-OX) 400 MG tablet Take 400 mg by mouth in the morning and at bedtime. 10/14/21   [provider]  metoprolol succinate (TOPROL-XL) 25 MG 24 hr tablet Take 1 tablet (25 mg total) by mouth 2 (two) times daily. 08/01/22   Kate Sable, MD  mycophenolate (MYFORTIC) 180 MG EC tablet Take 540 mg by mouth 2 (two) times daily. 11/12/21  [provider]  omeprazole (PRILOSEC) 20 MG capsule Take 20 mg by mouth as needed. 02/12/22   [provider]  predniSONE (DELTASONE) 5 MG tablet Take 5 mg by mouth daily. 10/09/21   [provider]  rivaroxaban (XARELTO) 20 MG TABS tablet Take 1 tablet (20 mg total) by mouth daily with supper. 04/23/22   Rise Mu, PA-C  sulfamethoxazole-trimethoprim (BACTRIM) 400-80 MG tablet Take by mouth. Takes Monday, Wednesday and Friday.    [provider]     Allergies  Patient has no known allergies.   Family History   Family History  Problem Relation Age of Onset   Hypertension Mother    Heart disease Mother        before age 66   Dementia Mother    Cancer Father        bone   Cancer Sister        breast   Cancer Sister        breast   CAD Other    Colon cancer Neg Hx    Rectal cancer Neg Hx    Stomach cancer Neg Hx       Physical Exam  Triage Vital Signs: ED Triage Vitals [08/24/22 2331]  Enc Vitals Group     BP (!) 168/85     Pulse Rate 65     Resp 16     Temp 98 F (36.7 C)     Temp src      SpO2 100 %     Weight      Height      Head Circumference      Peak Flow      Pain Score      Pain Loc      Pain Edu?      Excl. in [place]?     Updated Vital Signs: BP (!) 168/85   Pulse 65   Temp 98 F (36.7 C)   Resp 16   SpO2 100%    General: Awake, no distress.  CV:  Good peripheral perfusion.  Resp:  Normal effort.  Abd:  No distention.  Other:  LUE: 2 small areas of abrasions which are nonbleeding.   ED Results / Procedures / Treatments  Labs (all labs ordered are listed, but only abnormal results are displayed) Labs Reviewed - No data to display   EKG  None   RADIOLOGY None   Official radiology report(s): No results found.   PROCEDURES:  Critical Care performed: No  Procedures   MEDICATIONS ORDERED IN ED: Medications  amoxicillin-clavulanate (AUGMENTIN) 875-125 MG per tablet 1 tablet (has no administration in time range)     IMPRESSION / MDM / ASSESSMENT AND PLAN / ED COURSE  I reviewed the triage vital signs and the nursing notes.                             61 year old male presenting with superficial dog bite.  Given patient's immunosuppressed status, will start Augmentin empirically.  Wound cleansed thoroughly by a nurse.  Strict return precautions given.  Patient and spouse verbalized understanding agree with plan of care.  Patient's presentation is most consistent with acute, uncomplicated illness.   FINAL CLINICAL IMPRESSION(S) / ED DIAGNOSES   Final diagnoses:  Dog bite, initial encounter     Rx / DC Orders   ED Discharge Orders          Ordered  amoxicillin-clavulanate (AUGMENTIN) 875-125 MG tablet  2 times daily        08/24/22 2342             Note:  This document was prepared using Dragon voice recognition software and  may include unintentional dictation errors.   Paulette Blanch, MD 08/25/22 [phone]

## 2022-08-24 NOTE — Discharge Instructions (Signed)
Keep wound clean and dry.  Take antibiotic as prescribed (Augmentin 875 mg twice daily x7 days).  Return to the ER for worsening symptoms, increased redness/swelling, purulent discharge or other concerns.

## 2022-08-24 NOTE — ED Triage Notes (Signed)
Pt states his dog accidentally scratched pt's left mid forearm with his teeth. Small bleeding controlled laceraiton noted. Pt states he wants to make sure it does not get infected due to immune suppresion from kidney transplant.

## 2022-08-25 MED ORDER — BACITRACIN ZINC 500 UNIT/GM EX OINT
TOPICAL_OINTMENT | Freq: Once | CUTANEOUS | Status: AC
  Filled 2022-08-25: qty 0.9

## 2022-08-25 NOTE — ED Notes (Signed)
Wound cleaned with NS plus betadine, bacitracin applied, non adherent pad and kerlix wrapped per verbal EDP [place] order.

## 2022-09-04 ENCOUNTER — Other Ambulatory Visit
Admission: RE | Admit: 2022-09-04 | Discharge: 2022-09-04 | Disposition: A | Discharge status: Home / Self Care | Payer: PPO | Attending: Cardiology | Admitting: Cardiology

## 2022-09-04 DIAGNOSIS — Z01818 Encounter for other preprocedural examination: Secondary | ICD-10-CM | POA: Insufficient documentation

## 2022-09-04 DIAGNOSIS — I4819 Other persistent atrial fibrillation: Secondary | ICD-10-CM | POA: Diagnosis present

## 2022-09-04 LAB — CBC WITH DIFFERENTIAL/PLATELET
Abs Immature Granulocytes: 0.1 10*3/uL — ABNORMAL HIGH (ref 0.00–0.07)
Basophils Absolute: 0 10*3/uL (ref 0.0–0.1)
Basophils Relative: 1 %
Eosinophils Absolute: 0.1 10*3/uL (ref 0.0–0.5)
Eosinophils Relative: 2 %
HCT: 44.1 % (ref 39.0–52.0)
Hemoglobin: 13.7 g/dL (ref 13.0–17.0)
Immature Granulocytes: 2 %
Lymphocytes Relative: 12 %
Lymphs Abs: 0.8 10*3/uL (ref 0.7–4.0)
MCH: 29.5 pg (ref 26.0–34.0)
MCHC: 31.1 g/dL (ref 30.0–36.0)
MCV: 95 fL (ref 80.0–100.0)
Monocytes Absolute: 0.6 10*3/uL (ref 0.1–1.0)
Monocytes Relative: 9 %
Neutro Abs: 4.9 10*3/uL (ref 1.7–7.7)
Neutrophils Relative %: 74 %
Platelets: 147 10*3/uL — ABNORMAL LOW (ref 150–400)
RBC: 4.64 MIL/uL (ref 4.22–5.81)
RDW: 13.6 % (ref 11.5–15.5)
WBC: 6.4 10*3/uL (ref 4.0–10.5)
nRBC: 0 % (ref 0.0–0.2)

## 2022-09-04 LAB — BASIC METABOLIC PANEL
Anion gap: 7 (ref 5–15)
BUN: 19 mg/dL (ref 8–23)
CO2: 28 mmol/L (ref 22–32)
Calcium: 9.2 mg/dL (ref 8.9–10.3)
Chloride: 105 mmol/L (ref 98–111)
Creatinine, Ser: 1.96 mg/dL — ABNORMAL HIGH (ref 0.61–1.24)
GFR, Estimated: 38 mL/min — ABNORMAL LOW (ref >60)
Glucose, Bld: 143 mg/dL — ABNORMAL HIGH (ref 70–99)
Potassium: 4.2 mmol/L (ref 3.5–5.1)
Sodium: 140 mmol/L (ref 135–145)

## 2022-09-17 ENCOUNTER — Telehealth: Payer: Self-pay | Admitting: Cardiology

## 2022-09-17 NOTE — Telephone Encounter (Signed)
Pt c/o medication issue:  1. Name of Medication: atorvastatin (LIPITOR) 40 MG tablet  2. How are you currently taking this medication (dosage and times per day)? 20 mg daily   3. Are you having a reaction (difficulty breathing--STAT)?   4. What is your medication issue? Pt's wife is uncertain as to how this medication is to be taken. She states that she was told 20 mg, but has been receiving his medication refills at 40 mg. Requesting call back to get clarification on this.

## 2022-09-17 NOTE — Telephone Encounter (Signed)
Called patients wife back and left a detailed VM per DPR on file. I informed her that we ordered the Atorvastatin at 40 MG back in 10/2021 when the patient was called and instructed to start. Encouraged her to call back with any other questions or concerns.

## 2022-09-23 ENCOUNTER — Encounter: Payer: Self-pay | Admitting: Emergency Medicine

## 2022-09-23 ENCOUNTER — Other Ambulatory Visit: Payer: Self-pay

## 2022-09-23 ENCOUNTER — Emergency Department
Admission: EM | Admit: 2022-09-23 | Discharge: 2022-09-23 | Disposition: A | Discharge status: Home / Self Care | Payer: PPO | Attending: Emergency Medicine | Admitting: Emergency Medicine

## 2022-09-23 ENCOUNTER — Emergency Department: Payer: PPO

## 2022-09-23 DIAGNOSIS — Z7901 Long term (current) use of anticoagulants: Secondary | ICD-10-CM | POA: Insufficient documentation

## 2022-09-23 DIAGNOSIS — R778 Other specified abnormalities of plasma proteins: Secondary | ICD-10-CM | POA: Diagnosis not present

## 2022-09-23 DIAGNOSIS — I509 Heart failure, unspecified: Secondary | ICD-10-CM | POA: Diagnosis not present

## 2022-09-23 DIAGNOSIS — I110 Hypertensive heart disease with heart failure: Secondary | ICD-10-CM | POA: Insufficient documentation

## 2022-09-23 DIAGNOSIS — I4891 Unspecified atrial fibrillation: Secondary | ICD-10-CM | POA: Insufficient documentation

## 2022-09-23 DIAGNOSIS — R002 Palpitations: Secondary | ICD-10-CM | POA: Diagnosis present

## 2022-09-23 LAB — CBC
HCT: 43.9 % (ref 39.0–52.0)
Hemoglobin: 13.7 g/dL (ref 13.0–17.0)
MCH: 29.5 pg (ref 26.0–34.0)
MCHC: 31.2 g/dL (ref 30.0–36.0)
MCV: 94.6 fL (ref 80.0–100.0)
Platelets: 143 10*3/uL — ABNORMAL LOW (ref 150–400)
RBC: 4.64 MIL/uL (ref 4.22–5.81)
RDW: 13.6 % (ref 11.5–15.5)
WBC: 6.7 10*3/uL (ref 4.0–10.5)
nRBC: 0 % (ref 0.0–0.2)

## 2022-09-23 LAB — BASIC METABOLIC PANEL
Anion gap: 7 (ref 5–15)
BUN: 19 mg/dL (ref 8–23)
CO2: 23 mmol/L (ref 22–32)
Calcium: 9.3 mg/dL (ref 8.9–10.3)
Chloride: 110 mmol/L (ref 98–111)
Creatinine, Ser: 1.86 mg/dL — ABNORMAL HIGH (ref 0.61–1.24)
GFR, Estimated: 40 mL/min — ABNORMAL LOW (ref >60)
Glucose, Bld: 122 mg/dL — ABNORMAL HIGH (ref 70–99)
Potassium: 4.1 mmol/L (ref 3.5–5.1)
Sodium: 140 mmol/L (ref 135–145)

## 2022-09-23 LAB — TROPONIN I (HIGH SENSITIVITY): Troponin I (High Sensitivity): 26 ng/L — ABNORMAL HIGH (ref <18)

## 2022-09-23 LAB — MAGNESIUM: Magnesium: 1.7 mg/dL (ref 1.7–2.4)

## 2022-09-23 MED ORDER — ETOMIDATE 2 MG/ML IV SOLN
INTRAVENOUS | Status: AC | PRN
  Administered 2022-09-23: 10 mg via INTRAVENOUS

## 2022-09-23 MED ORDER — ETOMIDATE 2 MG/ML IV SOLN
10.0000 mg | Freq: Once | INTRAVENOUS | Status: DC
  Filled 2022-09-23: qty 10

## 2022-09-23 NOTE — Sedation Documentation (Signed)
Pt on room air at this time

## 2022-09-23 NOTE — ED Notes (Signed)
Pt eating crackers , drinking ice water

## 2022-09-23 NOTE — Sedation Documentation (Signed)
Synchronized cardioversion performed at 200 Joules. Pt returned to normal sinus rhythm following cardioversion.

## 2022-09-23 NOTE — Sedation Documentation (Signed)
Pt O2 decreased to 2 liters

## 2022-09-23 NOTE — ED Provider Notes (Signed)
[facility] Provider Note    Event Date/Time   First MD Initiated Contact with Patient 09/23/22 0700     (approximate)   History   Chief Complaint Palpitations  HPI  [name] [name] is a 61 y.o. male with past medical history of hypertension, atrial fibrillation on Xarelto, CHF, and renal transplant who presents to the ED complaining of palpitations.  Patient reports that he was woken from sleep around 4:00 this morning with sensation that his heart was racing.  He states that he has felt generally unwell since the onset of the symptoms, but he denies any associated chest pain or shortness of breath.  He has not had any lightheadedness or near syncope, describes symptoms as similar to when he has dealt with atrial fibrillation in the past.  He took an extra dose of his metoprolol shortly after symptom onset, but has not had any relief since then.  He has been feeling well recently with no fevers, cough, nausea, vomiting, or diarrhea.  He has been taking both his metoprolol and Xarelto as prescribed, denies missing any Xarelto doses.     Physical Exam   Triage Vital Signs: ED Triage Vitals  Enc Vitals Group     BP 09/23/22 0652 (!) 122/92     Pulse Rate 09/23/22 0652 (!) 150     Resp 09/23/22 0652 18     Temp 09/23/22 0652 97.8 F (36.6 C)     Temp Source 09/23/22 0652 Oral     SpO2 09/23/22 0652 97 %     Weight 09/23/22 0645 265 lb (120.2 kg)     Height 09/23/22 0645 '5\' 10"'$  (1.778 m)     Head Circumference --      Peak Flow --      Pain Score 09/23/22 0645 8     Pain Loc --      Pain Edu? --      Excl. in [place]? --     Most recent vital signs: Vitals:   09/23/22 0845 09/23/22 0915  BP: 111/69 107/74  Pulse: 64 (!) 56  Resp: 14 10  Temp:    SpO2: 94% 99%    Constitutional: Alert and oriented. Eyes: Conjunctivae are normal. Head: Atraumatic. Nose: No congestion/rhinnorhea. Mouth/Throat: Mucous membranes are moist.  Cardiovascular:  Tachycardic, irregularly irregular rhythm. Grossly normal heart sounds.  2+ radial pulses bilaterally. Respiratory: Normal respiratory effort.  No retractions. Lungs CTAB. Gastrointestinal: Soft and nontender. No distention. Musculoskeletal: No lower extremity tenderness nor edema.  Neurologic:  Normal speech and language. No gross focal neurologic deficits are appreciated.    ED Results / Procedures / Treatments   Labs (all labs ordered are listed, but only abnormal results are displayed) Labs Reviewed  CBC - Abnormal; Notable for the following components:      Result Value   Platelets 143 (*)    All other components within normal limits  BASIC METABOLIC PANEL - Abnormal; Notable for the following components:   Glucose, Bld 122 (*)    Creatinine, Ser 1.86 (*)    GFR, Estimated 40 (*)    All other components within normal limits  TROPONIN I (HIGH SENSITIVITY) - Abnormal; Notable for the following components:   Troponin I (High Sensitivity) 26 (*)    All other components within normal limits  MAGNESIUM     EKG  ED ECG REPORT I, Blake Divine, the attending physician, personally viewed and interpreted this ECG.   Date: 09/23/2022  EKG Time:  6:51  Rate: 146  Rhythm: atrial fibrillation  Axis: Normal  Intervals:none  ST&T Change: Borderline ST depression diffusely  ED ECG REPORT I, Blake Divine, the attending physician, personally viewed and interpreted this ECG.   Date: 09/23/2022  EKG Time: 7:42  Rate: 81  Rhythm: normal sinus rhythm  Axis: Normal  Intervals:none  ST&T Change: None   RADIOLOGY Chest x-ray reviewed and interpreted by me with mild bilateral infiltrates concerning for pulmonary edema.  PROCEDURES:  Critical Care performed: Yes, see critical care procedure note(s)  .Critical Care  Performed by: Blake Divine, MD Authorized by: Blake Divine, MD   Critical care provider statement:    Critical care time (minutes):  30   Critical care  time was exclusive of:  Separately billable procedures and treating other patients and teaching time   Critical care was necessary to treat or prevent imminent or life-threatening deterioration of the following conditions:  Cardiac failure   Critical care was time spent personally by me on the following activities:  Development of treatment plan with patient or surrogate, discussions with consultants, evaluation of patient's response to treatment, examination of patient, ordering and review of laboratory studies, ordering and review of radiographic studies, ordering and performing treatments and interventions, pulse oximetry, re-evaluation of patient's condition and review of old charts   I assumed direction of critical care for this patient from another provider in my specialty: no   .Cardioversion  Date/Time: 09/23/2022 7:52 AM  Performed by: Blake Divine, MD Authorized by: Blake Divine, MD   Consent:    Consent obtained:  Written   Consent given by:  Patient   Risks discussed:  Cutaneous burn, death, induced arrhythmia and pain   Alternatives discussed:  Rate-control medication, observation and anti-coagulation medication Pre-procedure details:    Cardioversion basis:  Elective   Rhythm:  Atrial fibrillation   Electrode placement:  Anterior-posterior Patient sedated: Yes. Refer to sedation procedure documentation for details of sedation.  Attempt one:    Cardioversion mode:  Synchronous   Waveform:  Monophasic   Shock (Joules):  200   Shock outcome:  Conversion to normal sinus rhythm Post-procedure details:    Patient status:  Alert   Patient tolerance of procedure:  Tolerated well, no immediate complications .Sedation  Date/Time: 09/23/2022 7:53 AM  Performed by: Blake Divine, MD Authorized by: Blake Divine, MD   Consent:    Consent obtained:  Written   Consent given by:  Patient   Risks discussed:  Allergic reaction, dysrhythmia, inadequate sedation, nausea,  vomiting, respiratory compromise necessitating ventilatory assistance and intubation, prolonged sedation necessitating reversal and prolonged hypoxia resulting in organ damage   Alternatives discussed:  Anxiolysis and analgesia without sedation Universal protocol:    Immediately prior to procedure, a time out was called: yes     Patient identity confirmed:  Arm band and verbally with patient Indications:    Procedure performed:  Cardioversion   Procedure necessitating sedation performed by:  Physician performing sedation Pre-sedation assessment:    Time since last food or drink:  12   ASA classification: class 3 - patient with severe systemic disease     Mouth opening:  2 finger widths   Thyromental distance:  3 finger widths   Mallampati score:  II - soft palate, uvula, fauces visible   Neck mobility: normal     Pre-sedation assessments completed and reviewed: airway patency, cardiovascular function, hydration status, mental status, nausea/vomiting, pain level, respiratory function and temperature     Pre-sedation assessment  completed:  09/23/2022 7:30 AM Immediate pre-procedure details:    Reassessment: Patient reassessed immediately prior to procedure     Reviewed: vital signs, relevant labs/tests and NPO status     Verified: bag valve mask available, emergency equipment available, intubation equipment available, IV patency confirmed, oxygen available and suction available   Procedure details (see MAR for exact dosages):    Preoxygenation:  Nasal cannula   Sedation:  Etomidate   Intended level of sedation: deep   Analgesia:  None   Intra-procedure monitoring:  Blood pressure monitoring, cardiac monitor, continuous pulse oximetry, continuous capnometry, frequent LOC assessments and frequent vital sign checks   Intra-procedure events: hypoxia and respiratory depression     Intra-procedure management:  Airway repositioning   Total Provider sedation time (minutes):  10 Post-procedure  details:    Post-sedation assessment completed:  09/23/2022 7:55 AM   Attendance: Constant attendance by certified staff until patient recovered     Recovery: Patient returned to pre-procedure baseline     Post-sedation assessments completed and reviewed: airway patency, cardiovascular function, hydration status, mental status, nausea/vomiting, pain level, respiratory function and temperature     Patient is stable for discharge or admission: yes     Procedure completion:  Tolerated well, no immediate complications    MEDICATIONS ORDERED IN ED: Medications  etomidate (AMIDATE) injection 10 mg (10 mg Intravenous Not Given 09/23/22 0757)  etomidate (AMIDATE) injection (10 mg Intravenous Given 09/23/22 0740)     IMPRESSION / MDM / [place] / ED COURSE  I reviewed the triage vital signs and the nursing notes.                              61 y.o. male with past medical history of hypertension, renal transplant, atrial fibrillation on Xarelto, and CHF who presents to the ED complaining of sudden onset palpitations that woke him from sleep around 4:00 this morning.  Patient's presentation is most consistent with acute presentation with potential threat to life or bodily function.  Differential diagnosis includes, but is not limited to, atrial fibrillation, other arrhythmia, electrolyte abnormality, AKI, ACS, PE, pneumonia, CHF exacerbation.  Patient nontoxic-appearing and in no acute distress, vital signs remarkable for significant tachycardia as high as 160 but with stable blood pressure.  EKG shows atrial fibrillation with RVR, mild ST depressions noted diffusely, likely rate related.  Labs are reassuring with no significant anemia, leukocytosis, electrolyte abnormality, or or AKI.  Renal function is stable compared to previous, troponin mildly elevated but again stable compared to previous.  I had a long discussion with the patient and his spouse regarding rate control with IV  diltiazem versus sedation and cardioversion.  Following discussion of risks and benefits, patient and family expressed strong preference for cardioversion over rate control as he has had this performed multiple times in the past, is also scheduled for ablation later this week.  He was sedated with etomidate and successfully cardioverted on the first attempt with 200 J.  He did have brief period of apnea with associated drop in oxygen saturations to around 84%, both of which improved with jaw thrust and airway repositioning.  Follow-up EKG shows normal sinus rhythm with no ongoing ST depressions.  We will observe patient here in the ED briefly to ensure he remains in normal sinus rhythm, but if he continues to feel well following cardioversion he would be appropriate for discharge home with close cardiology follow-up.  Chest  x-ray concerning for possible pulmonary edema, however patient now asymptomatic following cardioversion and is requesting to be discharged home.  He may have developed some mild pulmonary edema related to A-fib with RVR, but do not feel additional diuresis is needed at this time now that A-fib has resolved.  He is appropriate for discharge home with cardiology follow-up, was counseled to return to the ED for new or worsening symptoms.  Patient agrees with plan.      FINAL CLINICAL IMPRESSION(S) / ED DIAGNOSES   Final diagnoses:  Atrial fibrillation with RVR [facility])     Rx / DC Orders   ED Discharge Orders          Ordered    Ambulatory referral to Cardiology        09/23/22 0920             Note:  This document was prepared using Dragon voice recognition software and may include unintentional dictation errors.   Blake Divine, MD 09/23/22 [id number]

## 2022-09-23 NOTE — Sedation Documentation (Addendum)
Pt SpO2 dropped to 81% on room air, Pt had brief period of Apnea. Jaw thrust performed by Dr. Charna Archer, Pt O2 increased to 4 liters with SpO2 returning to the mid 90s.

## 2022-09-23 NOTE — ED Notes (Addendum)
See triage note, pt reports a fib started at 0400 this am. Scheduled for ablation this Thursday. Denies cp or shob. NAD noted  Kidney transplant 4 years ago

## 2022-09-23 NOTE — ED Notes (Signed)
Pt requested wife to sign consent paper. Wife signed paper copy consent paper. Witnessed this Therapist, sports. Denies questions concerns. Placed in chart.

## 2022-09-23 NOTE — ED Notes (Signed)
Paper consent for synchronized cardioversion for A. Fib RVR.

## 2022-09-23 NOTE — Pre-Procedure Instructions (Signed)
Instructed patient on the following items: Arrival time 0515 Nothing to eat or drink after midnight Wednesday No meds AM of procedure Responsible person to drive you home and stay with you for 24 hrs  Have you missed any doses of anti-coagulant Xarelto- hasn't missed any doses.  Informed patient that we would be doing the tee on the table day of his ablation.  He does not need to come for TEE tomorrow.

## 2022-09-23 NOTE — ED Triage Notes (Addendum)
Pt to triage via w/c with no distress noted; reports that he feels as if he is in afib; st ablation sched for Thursday; st onset 4am, took extra ds of metoprolol without relief; st some left shoulder pain with no accomp symptoms; to room 15, placed on card monitor and O2 at 2l/min via [place]

## 2022-09-24 ENCOUNTER — Encounter (HOSPITAL_COMMUNITY): Admission: RE | Payer: PPO | Source: Home / Self Care

## 2022-09-24 ENCOUNTER — Ambulatory Visit (HOSPITAL_COMMUNITY): Admission: RE | Admit: 2022-09-24 | Payer: PPO | Source: Home / Self Care | Admitting: Cardiology

## 2022-09-24 DIAGNOSIS — Z01818 Encounter for other preprocedural examination: Secondary | ICD-10-CM

## 2022-09-24 DIAGNOSIS — I480 Paroxysmal atrial fibrillation: Secondary | ICD-10-CM

## 2022-09-24 SURGERY — ECHOCARDIOGRAM, TRANSESOPHAGEAL
Anesthesia: Monitor Anesthesia Care

## 2022-09-25 ENCOUNTER — Encounter (HOSPITAL_COMMUNITY)
Admission: RE | Disposition: A | Discharge status: Home / Self Care | Payer: Self-pay | Source: Home / Self Care | Attending: Cardiology

## 2022-09-25 ENCOUNTER — Encounter (HOSPITAL_COMMUNITY): Payer: Self-pay | Admitting: Cardiology

## 2022-09-25 ENCOUNTER — Ambulatory Visit (HOSPITAL_COMMUNITY): Payer: PPO | Admitting: Anesthesiology

## 2022-09-25 ENCOUNTER — Ambulatory Visit (HOSPITAL_BASED_OUTPATIENT_CLINIC_OR_DEPARTMENT_OTHER)
Admission: RE | Admit: 2022-09-25 | Discharge: 2022-09-25 | Disposition: A | Discharge status: Home / Self Care | Payer: PPO | Source: Home / Self Care | Attending: Cardiology | Admitting: Cardiology

## 2022-09-25 ENCOUNTER — Other Ambulatory Visit (HOSPITAL_COMMUNITY): Payer: Self-pay

## 2022-09-25 ENCOUNTER — Ambulatory Visit (HOSPITAL_COMMUNITY)
Admission: RE | Admit: 2022-09-25 | Discharge: 2022-09-25 | Disposition: A | Discharge status: Home / Self Care | Payer: PPO | Attending: Cardiology | Admitting: Cardiology

## 2022-09-25 ENCOUNTER — Ambulatory Visit (HOSPITAL_BASED_OUTPATIENT_CLINIC_OR_DEPARTMENT_OTHER): Payer: PPO | Admitting: Anesthesiology

## 2022-09-25 ENCOUNTER — Other Ambulatory Visit: Payer: Self-pay

## 2022-09-25 DIAGNOSIS — I509 Heart failure, unspecified: Secondary | ICD-10-CM | POA: Diagnosis not present

## 2022-09-25 DIAGNOSIS — Z79899 Other long term (current) drug therapy: Secondary | ICD-10-CM | POA: Insufficient documentation

## 2022-09-25 DIAGNOSIS — I130 Hypertensive heart and chronic kidney disease with heart failure and stage 1 through stage 4 chronic kidney disease, or unspecified chronic kidney disease: Secondary | ICD-10-CM | POA: Diagnosis not present

## 2022-09-25 DIAGNOSIS — Z7901 Long term (current) use of anticoagulants: Secondary | ICD-10-CM | POA: Diagnosis not present

## 2022-09-25 DIAGNOSIS — I5032 Chronic diastolic (congestive) heart failure: Secondary | ICD-10-CM | POA: Insufficient documentation

## 2022-09-25 DIAGNOSIS — G459 Transient cerebral ischemic attack, unspecified: Secondary | ICD-10-CM | POA: Diagnosis not present

## 2022-09-25 DIAGNOSIS — I081 Rheumatic disorders of both mitral and tricuspid valves: Secondary | ICD-10-CM

## 2022-09-25 DIAGNOSIS — I4819 Other persistent atrial fibrillation: Secondary | ICD-10-CM | POA: Insufficient documentation

## 2022-09-25 DIAGNOSIS — Z940 Kidney transplant status: Secondary | ICD-10-CM | POA: Insufficient documentation

## 2022-09-25 DIAGNOSIS — I4891 Unspecified atrial fibrillation: Secondary | ICD-10-CM

## 2022-09-25 DIAGNOSIS — N189 Chronic kidney disease, unspecified: Secondary | ICD-10-CM | POA: Diagnosis not present

## 2022-09-25 DIAGNOSIS — I110 Hypertensive heart disease with heart failure: Secondary | ICD-10-CM | POA: Diagnosis not present

## 2022-09-25 HISTORY — PX: ATRIAL FIBRILLATION ABLATION: EP1191

## 2022-09-25 HISTORY — PX: TEE WITHOUT CARDIOVERSION: SHX5443

## 2022-09-25 LAB — POCT ACTIVATED CLOTTING TIME
Activated Clotting Time: 281 seconds
Activated Clotting Time: 311 seconds

## 2022-09-25 SURGERY — ATRIAL FIBRILLATION ABLATION
Anesthesia: General

## 2022-09-25 MED ORDER — MIDAZOLAM HCL 2 MG/2ML IJ SOLN
INTRAMUSCULAR | Status: DC | PRN
  Administered 2022-09-25: 2 mg via INTRAVENOUS

## 2022-09-25 MED ORDER — SODIUM CHLORIDE 0.9 % IV SOLN: 250.0000 mL | INTRAVENOUS | Status: DC | PRN

## 2022-09-25 MED ORDER — HEPARIN (PORCINE) IN NACL 1000-0.9 UT/500ML-% IV SOLN
INTRAVENOUS | Status: DC | PRN
  Administered 2022-09-25 (×3): 500 mL

## 2022-09-25 MED ORDER — ONDANSETRON HCL 4 MG/2ML IJ SOLN: 4.0000 mg | Freq: Four times a day (QID) | INTRAMUSCULAR | Status: DC | PRN

## 2022-09-25 MED ORDER — HEPARIN SODIUM (PORCINE) 1000 UNIT/ML IJ SOLN
INTRAMUSCULAR | Status: DC | PRN
  Administered 2022-09-25: 1000 [IU] via INTRAVENOUS

## 2022-09-25 MED ORDER — HEPARIN (PORCINE) IN NACL 1000-0.9 UT/500ML-% IV SOLN
INTRAVENOUS | Status: AC
  Filled 2022-09-25: qty 500

## 2022-09-25 MED ORDER — SODIUM CHLORIDE 0.9% FLUSH: 3.0000 mL | Freq: Two times a day (BID) | INTRAVENOUS | Status: DC

## 2022-09-25 MED ORDER — PHENYLEPHRINE HCL-NACL 20-0.9 MG/250ML-% IV SOLN
INTRAVENOUS | Status: DC | PRN
  Administered 2022-09-25: 25 ug/min via INTRAVENOUS

## 2022-09-25 MED ORDER — PHENYLEPHRINE 80 MCG/ML (10ML) SYRINGE FOR IV PUSH (FOR BLOOD PRESSURE SUPPORT)
PREFILLED_SYRINGE | INTRAVENOUS | Status: DC | PRN
  Administered 2022-09-25: 80 ug via INTRAVENOUS
  Administered 2022-09-25: 160 ug via INTRAVENOUS

## 2022-09-25 MED ORDER — PANTOPRAZOLE SODIUM 40 MG PO TBEC
40.0000 mg | DELAYED_RELEASE_TABLET | Freq: Every day | ORAL | Status: DC
  Administered 2022-09-25: 40 mg via ORAL
  Filled 2022-09-25: qty 1

## 2022-09-25 MED ORDER — PROTAMINE SULFATE 10 MG/ML IV SOLN
INTRAVENOUS | Status: DC | PRN
  Administered 2022-09-25: 20 mg via INTRAVENOUS
  Administered 2022-09-25: 15 mg via INTRAVENOUS

## 2022-09-25 MED ORDER — SODIUM CHLORIDE 0.9 % IV SOLN: INTRAVENOUS | Status: DC

## 2022-09-25 MED ORDER — PROPOFOL 10 MG/ML IV BOLUS
INTRAVENOUS | Status: DC | PRN
  Administered 2022-09-25: 180 mg via INTRAVENOUS

## 2022-09-25 MED ORDER — DEXAMETHASONE SODIUM PHOSPHATE 10 MG/ML IJ SOLN
INTRAMUSCULAR | Status: DC | PRN
  Administered 2022-09-25: 10 mg via INTRAVENOUS

## 2022-09-25 MED ORDER — ROCURONIUM BROMIDE 10 MG/ML (PF) SYRINGE
PREFILLED_SYRINGE | INTRAVENOUS | Status: DC | PRN
  Administered 2022-09-25: 60 mg via INTRAVENOUS

## 2022-09-25 MED ORDER — ACETAMINOPHEN 325 MG PO TABS: 650.0000 mg | ORAL_TABLET | ORAL | Status: DC | PRN

## 2022-09-25 MED ORDER — SODIUM CHLORIDE 0.9% FLUSH: 3.0000 mL | INTRAVENOUS | Status: DC | PRN

## 2022-09-25 MED ORDER — HEPARIN SODIUM (PORCINE) 1000 UNIT/ML IJ SOLN
INTRAMUSCULAR | Status: AC
  Filled 2022-09-25: qty 10

## 2022-09-25 MED ORDER — SUGAMMADEX SODIUM 200 MG/2ML IV SOLN
INTRAVENOUS | Status: DC | PRN
  Administered 2022-09-25: 200 mg via INTRAVENOUS

## 2022-09-25 MED ORDER — COLCHICINE 0.6 MG PO TABS
0.6000 mg | ORAL_TABLET | Freq: Two times a day (BID) | ORAL | Status: DC
  Administered 2022-09-25: 0.6 mg via ORAL
  Filled 2022-09-25: qty 1

## 2022-09-25 MED ORDER — EPHEDRINE SULFATE-NACL 50-0.9 MG/10ML-% IV SOSY
PREFILLED_SYRINGE | INTRAVENOUS | Status: DC | PRN
  Administered 2022-09-25 (×2): 10 mg via INTRAVENOUS

## 2022-09-25 MED ORDER — PANTOPRAZOLE SODIUM 40 MG PO TBEC
40.0000 mg | DELAYED_RELEASE_TABLET | Freq: Every day | ORAL | 0 refills | Status: DC
  Filled 2022-09-25: qty 45, 45d supply, fill #0

## 2022-09-25 MED ORDER — ONDANSETRON HCL 4 MG/2ML IJ SOLN
INTRAMUSCULAR | Status: DC | PRN
  Administered 2022-09-25: 4 mg via INTRAVENOUS

## 2022-09-25 MED ORDER — LIDOCAINE 2% (20 MG/ML) 5 ML SYRINGE
INTRAMUSCULAR | Status: DC | PRN
  Administered 2022-09-25: 60 mg via INTRAVENOUS

## 2022-09-25 MED ORDER — FENTANYL CITRATE (PF) 100 MCG/2ML IJ SOLN
INTRAMUSCULAR | Status: DC | PRN
  Administered 2022-09-25: 100 ug via INTRAVENOUS

## 2022-09-25 MED ORDER — COLCHICINE 0.6 MG PO TABS
0.6000 mg | ORAL_TABLET | Freq: Two times a day (BID) | ORAL | 0 refills | Status: DC
  Filled 2022-09-25: qty 10, 5d supply, fill #0

## 2022-09-25 MED ORDER — HEPARIN SODIUM (PORCINE) 1000 UNIT/ML IJ SOLN
INTRAMUSCULAR | Status: DC | PRN
  Administered 2022-09-25: 6000 [IU] via INTRAVENOUS
  Administered 2022-09-25: 4000 [IU] via INTRAVENOUS
  Administered 2022-09-25: 18000 [IU] via INTRAVENOUS

## 2022-09-25 SURGICAL SUPPLY — 20 items
BAG SNAP BAND KOVER 36X36 (MISCELLANEOUS) IMPLANT
CATH 8FR REPROCESSED SOUNDSTAR (CATHETERS) ×1 IMPLANT
CATH 8FR SOUNDSTAR REPROCESSED (CATHETERS) IMPLANT
CATH ABLAT QDOT MICRO BI TC DF (CATHETERS) IMPLANT
CATH OCTARAY 2.0 F 3-3-3-3-3 (CATHETERS) IMPLANT
CATH S-M CIRCA TEMP PROBE (CATHETERS) IMPLANT
CATH WEBSTER BI DIR CS D-F CRV (CATHETERS) IMPLANT
CLOSURE PERCLOSE PROSTYLE (VASCULAR PRODUCTS) IMPLANT
COVER SWIFTLINK CONNECTOR (BAG) ×1 IMPLANT
MAT PREVALON FULL STRYKER (MISCELLANEOUS) IMPLANT
PACK EP LATEX FREE (CUSTOM PROCEDURE TRAY) ×1
PACK EP LF (CUSTOM PROCEDURE TRAY) ×1 IMPLANT
PAD DEFIB RADIO PHYSIO CONN (PAD) ×1 IMPLANT
PATCH CARTO3 (PAD) IMPLANT
SHEATH BAYLIS TRANSSEPTAL 98CM (NEEDLE) IMPLANT
SHEATH CARTO VIZIGO SM CVD (SHEATH) IMPLANT
SHEATH PINNACLE 8F 10CM (SHEATH) IMPLANT
SHEATH PINNACLE 9F 10CM (SHEATH) IMPLANT
SHEATH PROBE COVER 6X72 (BAG) IMPLANT
TUBING SMART ABLATE COOLFLOW (TUBING) IMPLANT

## 2022-09-25 NOTE — Progress Notes (Signed)
Pt bled left femoral vein after ambulation, bleeding stopped after 10 minutes, Dr Quentin Ore was contacted, pt to lay flat 30 minutes and try again to DC.

## 2022-09-25 NOTE — Transfer of Care (Signed)
Immediate Anesthesia Transfer of Care Note  Patient: [name] [name]  Procedure(s) Performed: ATRIAL FIBRILLATION ABLATION TRANSESOPHAGEAL ECHOCARDIOGRAM (TEE)  Patient Location: Cath Lab  Anesthesia Type:General  Level of Consciousness: awake, alert , and oriented  Airway & Oxygen Therapy: Patient Spontanous Breathing and Patient connected to nasal cannula oxygen  Post-op Assessment: Report given to RN, Post -op Vital signs reviewed and stable, and Patient moving all extremities X 4  Post vital signs: Reviewed and stable  Last Vitals:  Vitals Value Taken Time  BP 150/68 09/25/22 1004  Temp    Pulse 73 09/25/22 1005  Resp 22 09/25/22 1005  SpO2 93 % 09/25/22 1005  Vitals shown include unvalidated device data.  Last Pain:  Vitals:   09/25/22 0558  TempSrc:   PainSc: 0-No pain         Complications: There were no known notable events for this encounter.

## 2022-09-25 NOTE — CV Procedure (Signed)
    TRANSESOPHAGEAL ECHOCARDIOGRAM   NAME:  TORIEN RAMROOP   MRN: [id number] DOB:  13-Jun-1961   ADMIT DATE: 09/25/2022  INDICATIONS:  Atrial fib  PROCEDURE:   Informed consent was obtained prior to the procedure. The risks, benefits and alternatives for the procedure were discussed and the patient comprehended these risks.  Risks include, but are not limited to, cough, sore throat, vomiting, nausea, somnolence, esophageal and stomach trauma or perforation, bleeding, low blood pressure, aspiration, pneumonia, infection, trauma to the teeth and death.    After a procedural time-out, the patient was placed under general anesthesia. The transesophageal probe was inserted in the esophagus and stomach without difficulty and multiple views were obtained.    COMPLICATIONS:    There were no immediate complications.  FINDINGS:  LEFT VENTRICLE: EF = 60-65%. No regional wall motion abnormalities.  RIGHT VENTRICLE: Normal size and function.   LEFT ATRIUM: Moderately dilated  LEFT ATRIAL APPENDAGE: No thrombus.   RIGHT ATRIUM: Normal  AORTIC VALVE:  Trileaflet.   MITRAL VALVE:    Normal. Trivial MR  TRICUSPID VALVE: Normal. Trivial TR  PULMONIC VALVE: Grossly normal.  INTERATRIAL SEPTUM: No PFO or ASD.  PERICARDIUM: No effusion  DESCENDING AORTA: Normal    Dainelle Hun,MD 11:34 AM

## 2022-09-25 NOTE — Discharge Instructions (Signed)

## 2022-09-25 NOTE — Interval H&P Note (Signed)
History and Physical Interval Note:  09/25/2022 11:33 AM  [name] [name]  has presented today for surgery, with the diagnosis of afib.  The various methods of treatment have been discussed with the patient and family. After consideration of risks, benefits and other options for treatment, the patient has consented to  Procedure(s): ATRIAL FIBRILLATION ABLATION (N/A) TRANSESOPHAGEAL ECHOCARDIOGRAM (TEE) (N/A) as a surgical intervention.  The patient's history has been reviewed, patient examined, no change in status, stable for surgery.  I have reviewed the patient's chart and labs.  Questions were answered to the patient's satisfaction.     [name]

## 2022-09-25 NOTE — Anesthesia Procedure Notes (Signed)
Procedure Name: Intubation Date/Time: 09/25/2022 7:49 AM  Performed by: Harden Mo, CRNAPre-anesthesia Checklist: Patient identified, Emergency Drugs available, Suction available and Patient being monitored Patient Re-evaluated:Patient Re-evaluated prior to induction Oxygen Delivery Method: Circle System Utilized Preoxygenation: Pre-oxygenation with 100% oxygen Induction Type: IV induction Ventilation: Mask ventilation without difficulty and Oral airway inserted - appropriate to patient size Laryngoscope Size: Sabra Heck and 2 Grade View: Grade I Tube type: Oral Tube size: 7.5 mm Number of attempts: 1 Airway Equipment and Method: Stylet and Oral airway Placement Confirmation: ETT inserted through vocal cords under direct vision, positive ETCO2 and breath sounds checked- equal and bilateral Secured at: 23 cm Tube secured with: Tape Dental Injury: Teeth and Oropharynx as per pre-operative assessment

## 2022-09-25 NOTE — Anesthesia Preprocedure Evaluation (Signed)
Anesthesia Evaluation  Patient identified by MRN, date of birth, ID band Patient awake    Reviewed: Allergy & Precautions, H&P , NPO status , Patient's Chart, lab work & pertinent test results  Airway Mallampati: II   Neck ROM: full    Dental   Pulmonary neg pulmonary ROS,    breath sounds clear to auscultation       Cardiovascular hypertension, +CHF  + dysrhythmias Atrial Fibrillation  Rhythm:regular Rate:Normal     Neuro/Psych TIA Neuromuscular disease    GI/Hepatic GERD  ,  Endo/Other    Renal/GU Renal InsufficiencyRenal diseasePt is s/p renal transplant     Musculoskeletal  (+) Arthritis ,   Abdominal   Peds  Hematology   Anesthesia Other Findings   Reproductive/Obstetrics                             Anesthesia Physical Anesthesia Plan  ASA: 3  Anesthesia Plan: General   Post-op Pain Management:    Induction: Intravenous  PONV Risk Score and Plan: 2 and Ondansetron, Dexamethasone, Midazolam and Treatment may vary due to age or medical condition  Airway Management Planned: Oral ETT  Additional Equipment: TEE  Intra-op Plan:   Post-operative Plan: Extubation in OR  Informed Consent: I have reviewed the patients History and Physical, chart, labs and discussed the procedure including the risks, benefits and alternatives for the proposed anesthesia with the patient or authorized representative who has indicated his/her understanding and acceptance.     Dental advisory given  Plan Discussed with: CRNA, Anesthesiologist and Surgeon  Anesthesia Plan Comments:         Anesthesia Quick Evaluation

## 2022-09-25 NOTE — Progress Notes (Signed)
  Echocardiogram Echocardiogram Transesophageal has been performed.  [name] [name] 09/25/2022, 8:18 AM

## 2022-09-25 NOTE — H&P (Signed)
Electrophysiology Office Follow up Visit Note:     Date:  09/25/2022    ID:  [name] [name], DOB [birth year], MRN [id number]   PCP:  Gara Kroner, DO   [place] Cardiologist:  Kate Sable, MD  [facility] Electrophysiologist:  Vickie Epley, MD      Interval History:     [name] [name] is a 61 y.o. male who presents for a follow up visit. They were last seen in clinic January 01, 2022 for persistent atrial fibrillation.  He is on Xarelto for stroke prophylaxis.  At the last appointment we discussed him working on weight loss.  Since our last appointment, the patient presented to the emergency room on May 23, 2022 with atrial fibrillation.  In the ER, he was started on diltiazem.  He successfully converted to sinus rhythm.  He presents today to discuss rhythm control strategies for his atrial fibrillation.   He is with his wife today in clinic wife previously met.  He is very symptomatic when in atrial fibrillation and would like to discuss treatment options to prevent future recurrences.  Presents for PVI. Procedure reviewed. Has been taking anticoagulation without issues. Did have a trip to the ER recently due to symptomatic AF w/ RVR.     Objective      Past Medical History:  Diagnosis Date   Abnormal stress test 05/07/2018   Actinic keratoses 05/01/2020   Arthritis     Atrial fibrillation ([place]) 11/28/2016   Atrial fibrillation with RVR ([facility])     Cataract      bilateral- right removed, left present   Cervical radiculopathy 03/01/2020   Cherry angioma 05/01/2020   Chest pain syndrome 01/26/2020   Chronic diastolic CHF (congestive heart failure) ([place])     Chronic kidney disease, stage V ([place]) 10/16/5207   Complication of anesthesia      problem with bladder not waking up after surgery-for right nephrectomy   Dermatofibroma 05/01/2020   Dry skin 05/01/2020   Dysrhythmia     Encounter for adequacy testing for dialysis ([place]) 09/18/2014   Encounter for  therapeutic drug monitoring 12/05/2016   Enlarged kidney      right   ESRD on dialysis ([place]) 01/23/3611   Folliculitis 01/04/4974   GERD without esophagitis 12/05/2020   Gout     History of gout 01/30/2020   HLD (hyperlipidemia) 11/28/2016   Hypertension     Hypokalemia 11/28/2016   Hypomagnesemia 01/30/2020   Immunosuppression ([place]) 01/17/2019   Immunosuppressive management encounter following kidney transplant 01/25/2019   Lapband APS July 2015 06/20/2014   Left shoulder pain 06/14/2020   Mixed hyperlipidemia 11/13/2014   Morbid obesity BMI 42 01/04/2014   Multiple benign nevi 05/01/2020   Paroxysmal A-fib ([place]) 04/22/2022   Renal disorder     Renal failure 07/13/2015   Renal insufficiency      KIDNEY DISEASE - FOLLOWED BY DR. Lorrene Reid NOTES ON CHART   Renal insufficiency 04/22/2022    KIDNEY DISEASE - FOLLOWED BY DR. 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 12/24/2018   Serum total bilirubin elevated 01/30/2020   Skin tag 05/01/2020   Stage 3b chronic kidney disease ([place]) 01/30/2020   Status post gastric banding 06/20/2014   Status post laparoscopic cholecystectomy August 2016 07/13/2015   Status post living-donor kidney transplantation 01/17/2019  Sun-damaged skin 05/01/2020   TIA (transient ischemic attack) 07/14/2017   Vertigo             Past Surgical History:  Procedure Laterality Date   AV FISTULA PLACEMENT        "IT CLOSED UP AND DOES NOT WORK"   AV FISTULA PLACEMENT Right 10/27/2014    Procedure: ARTERIOVENOUS (AV) FISTULA CREATION;  Surgeon: Conrad [place], MD;  Location: [place] [facility];  Service: Vascular;  Laterality: Right;   AV FISTULA PLACEMENT Right 08/22/2015    Procedure: BRACHIOCEPHALIC ARTERIOVENOUS (AV) FISTULA CREATION;  Surgeon: Conrad Ph[place], MD;  Location: [place];  Service: Vascular;  Laterality: Right;   CATARACTS  REMOVED Right     CHOLECYSTECTOMY N/A 07/13/2015    Procedure: LAPAROSCOPIC CHOLECYSTECTOMY WITH INTRAOPERATIVE CHOLANGIOGRAM;  Surgeon: Johnathan Hausen, MD;  Location: [facility];  Service: General;  Laterality: N/A;   EYE SURGERY        left cataract surgery-lens implant   HERNIA REPAIR       INSERTION OF MESH N/A 06/20/2014    Procedure: INSERTION OF MESH;  Surgeon: Pedro Earls, MD;  Location: [facility];  Service: General;  Laterality: N/A;   KNEE SURGERY   2001   LAPAROSCOPIC GASTRIC BANDING N/A 06/20/2014    Procedure: LAPAROSCOPIC GASTRIC BANDING and VENTRAL HERNIA REPAIR WITH MESH;  Surgeon: Pedro Earls, MD;  Location: [facility];  Service: General;  Laterality: N/A;   TOTAL NEPHRECTOMY Right        Current Medications: Active Medications      Current Meds  Medication Sig   atorvastatin (LIPITOR) 40 MG tablet Take 40 mg by mouth daily.   ENVARSUS XR 1 MG TB24 Take 6 mg by mouth daily.   magnesium oxide (MAG-OX) 400 MG tablet Take 400 mg by mouth in the morning and at bedtime.   metoprolol succinate (TOPROL-XL) 25 MG 24 hr tablet Take 25 mg by mouth daily.   mycophenolate (MYFORTIC) 180 MG EC tablet Take 540 mg by mouth 2 (two) times daily.   omeprazole (PRILOSEC) 20 MG capsule Take 20 mg by mouth as needed.   predniSONE (DELTASONE) 5 MG tablet Take 5 mg by mouth daily.   rivaroxaban (XARELTO) 20 MG TABS tablet Take 1 tablet (20 mg total) by mouth daily with supper.   sulfamethoxazole-trimethoprim (BACTRIM) 400-80 MG tablet Take by mouth. Takes Monday, Wednesday and Friday.        Allergies:   Patient has no known allergies.    Social History         Socioeconomic History   Marital status: Married      Spouse name: Not on file   Number of children: 12   Years of education: 2   Highest education level: Not on file  Occupational History   Occupation: Retired  Tobacco Use   Smoking status: Never   Smokeless tobacco: Never  Vaping Use   Vaping Use: Never used  Substance  and Sexual Activity   Alcohol use: No      Alcohol/week: 0.0 standard drinks of alcohol   Drug use: No   Sexual activity: Not on file  Other Topics Concern   Not on file  Social History Narrative    Lives at home with his wife.    Right-handed.    No caffeine use.    Social Determinants of Health    Financial Resource Strain: Not on file  Food Insecurity: Not on file  Transportation Needs: Not on file  Physical Activity: Not on file  Stress: Not on file  Social Connections: Not on file      Family History: The patient's family history includes CAD in an other family member; Cancer in his father, sister, and sister; Dementia in his mother; Heart disease in his mother; Hypertension in his mother. There is no history of Colon cancer, Rectal cancer, or Stomach cancer.   ROS:   Please see the history of present illness.    All other systems reviewed and are negative.   EKGs/Labs/Other Studies Reviewed:     The following studies were reviewed today:         Recent Labs: 11/13/2021: Magnesium 1.7 04/23/2022: TSH 1.715 05/23/2022: BUN 22; Creatinine, Ser 2.01; Hemoglobin 14.6; Platelets 153; Potassium 4.4; Sodium 140 05/24/2022: B Natriuretic Peptide 238.8  Recent Lipid Panel Labs (Brief)          Component Value Date/Time    CHOL 187 11/20/2021 0809    TRIG 178 (H) 11/20/2021 0809    HDL 38 (L) 11/20/2021 0809    CHOLHDL 4.9 11/20/2021 0809    CHOLHDL 3.1 11/28/2016 0346    VLDL 11 11/28/2016 0346    LDLCALC 117 (H) 11/20/2021 0809        Physical Exam:     VS:  BP 149/80 (BP Location: Left Arm, Patient Position: Sitting, Cuff Size: Normal)   Pulse 61   Ht _0  (1.778 m)   Wt 265 lb (120.2 kg)   SpO2 98%   BMI 38.02 kg/m         Wt Readings from Last 3 Encounters:  06/04/22 265 lb (120.2 kg)  05/23/22 268 lb 15.4 oz (122 kg)  04/23/22 268 lb (121.6 kg)      GEN:  Well nourished, well developed in no acute distress HEENT: Normal NECK: No JVD; No  carotid bruits LYMPHATICS: No lymphadenopathy CARDIAC: RRR, no murmurs, rubs, gallops RESPIRATORY:  Clear to auscultation without rales, wheezing or rhonchi  ABDOMEN: Soft, non-tender, non-distended MUSCULOSKELETAL:  No edema; No deformity  SKIN: Warm and dry NEUROLOGIC:  Alert and oriented x 3 PSYCHIATRIC:  Normal affect            Assessment ASSESSMENT:     1. Persistent atrial fibrillation ([place])   2. Chronic diastolic CHF (congestive heart failure) ([place])   3. TIA (transient ischemic attack)     PLAN:     In order of problems listed above:   #Persistent atrial fibrillation Recurrent.  Becoming more frequent.  Antiarrhythmic drug options are good to be limited because of his medication regimen given his history of renal transplant.   Discussed treatment options today for his AF including antiarrhythmic drug therapy and ablation. Discussed risks, recovery and likelihood of success. Discussed potential need for repeat ablation procedures and antiarrhythmic drugs after an initial ablation. They wish to proceed with scheduling.   Risk, benefits, and alternatives to EP study and radiofrequency ablation for afib were also discussed in detail today. These risks include but are not limited to stroke, bleeding, vascular damage, tamponade, perforation, damage to the esophagus, lungs, and other structures, pulmonary vein stenosis, worsening renal function, and death. The patient understands these risk and wishes to proceed.  We will therefore proceed with catheter ablation at the next available time.  Carto, ICE, anesthesia are requested for the procedure.     Given his baseline creatinine and history of renal transplant, no CT scan prior to the procedure.  He will get a transesophageal echo  the day before the procedure.   Plan for PVI today. Procedure reviewed.   #TIA Continue Xarelto for stroke prophylaxis   #History of renal transplant Follows at [facility].  Continue tacrolimus,  Bactrim, prednisone, Myfortic.          Signed, Lars Mage, MD, [facility], [facility] 09/25/2022 Electrophysiology  [facility]

## 2022-09-25 NOTE — Anesthesia Postprocedure Evaluation (Signed)
Anesthesia Post Note  Patient: [name] [name]  Procedure(s) Performed: ATRIAL FIBRILLATION ABLATION TRANSESOPHAGEAL ECHOCARDIOGRAM (TEE)     Patient location during evaluation: PACU Anesthesia Type: General Level of consciousness: awake and alert Pain management: pain level controlled Vital Signs Assessment: post-procedure vital signs reviewed and stable Respiratory status: spontaneous breathing, nonlabored ventilation, respiratory function stable and patient connected to nasal cannula oxygen Cardiovascular status: blood pressure returned to baseline and stable Postop Assessment: no apparent nausea or vomiting Anesthetic complications: no   There were no known notable events for this encounter.  Last Vitals:  Vitals:   09/25/22 1046 09/25/22 1048  BP: (!) 143/86 128/77  Pulse: 68 72  Resp: 14 19  Temp:    SpO2: 91% 93%    Last Pain:  Vitals:   09/25/22 1048  TempSrc:   PainSc: 0-No pain                 Shemar Plemmons S

## 2022-09-26 ENCOUNTER — Encounter (HOSPITAL_COMMUNITY): Payer: Self-pay | Admitting: Cardiology

## 2022-10-01 ENCOUNTER — Telehealth: Payer: Self-pay

## 2022-10-01 NOTE — Telephone Encounter (Signed)
        Patient  visited [facility] on 10/24    Telephone encounter attempt :  1st  A HIPAA compliant voice message was left requesting a return call.  Instructed patient to call back    [facility], [facility]  [phone] [address], [place], [place] [address] Phone: [phone] Email: [email or web address]

## 2022-10-01 NOTE — Telephone Encounter (Signed)
     Patient  visit on 10/24  at [place]  Have you been able to follow up with your primary care physician?YES  The patient was or was not able to obtain any needed medicine or equipment. YES  Are there diet recommendations that you are having difficulty following? NA  Patient expresses understanding of discharge instructions and education provided has no other needs at this time.   Ramirez-Perez, [facility], Care Management  [phone] [address], [place], [place] [address] Phone: [phone] Email: [email or web address]

## 2022-10-27 ENCOUNTER — Ambulatory Visit (HOSPITAL_COMMUNITY)
Admission: RE | Admit: 2022-10-27 | Discharge: 2022-10-27 | Disposition: A | Discharge status: Home / Self Care | Payer: PPO | Source: Ambulatory Visit | Attending: Physician Assistant | Admitting: Physician Assistant

## 2022-10-27 ENCOUNTER — Ambulatory Visit: Payer: PPO | Admitting: Cardiology

## 2022-10-27 VITALS — BP 148/80 | HR 65 | Ht 70.0 in | Wt 262.2 lb

## 2022-10-27 DIAGNOSIS — Z940 Kidney transplant status: Secondary | ICD-10-CM | POA: Insufficient documentation

## 2022-10-27 DIAGNOSIS — Z7902 Long term (current) use of antithrombotics/antiplatelets: Secondary | ICD-10-CM | POA: Diagnosis not present

## 2022-10-27 DIAGNOSIS — E669 Obesity, unspecified: Secondary | ICD-10-CM | POA: Diagnosis not present

## 2022-10-27 DIAGNOSIS — Z79899 Other long term (current) drug therapy: Secondary | ICD-10-CM | POA: Insufficient documentation

## 2022-10-27 DIAGNOSIS — I4819 Other persistent atrial fibrillation: Secondary | ICD-10-CM | POA: Insufficient documentation

## 2022-10-27 DIAGNOSIS — Z713 Dietary counseling and surveillance: Secondary | ICD-10-CM | POA: Diagnosis not present

## 2022-10-27 DIAGNOSIS — Z6837 Body mass index (BMI) 37.0-37.9, adult: Secondary | ICD-10-CM | POA: Diagnosis not present

## 2022-10-27 DIAGNOSIS — I10 Essential (primary) hypertension: Secondary | ICD-10-CM | POA: Diagnosis not present

## 2022-10-27 NOTE — Progress Notes (Addendum)
Primary Care Physician: Gara Kroner, DO Primary Cardiologist: Dr Garen Lah Primary Electrophysiologist: Dr Quentin Ore Referring Physician: Dr Dwain Sarna is a 61 y.o. male with a history of chronic diastolic CHF, ESRD s/p renal transplant 2019, HTN, HLD, atrial fibrillation who presents for follow up in the [facility]. Patient is on Xarelto for a CHADS2VASC score of 1. Patient has had several cardioversions and underwent afib ablation with Dr Quentin Ore on 09/25/22.  On follow up today, patient report that he has done well with no afib since his ablation. He did have some orthopnea following the procedure but his has resolved. No chest pain, swallowing pain, or groin issues.   Today, he denies symptoms of palpitations, chest pain, shortness of breath, orthopnea, PND, lower extremity edema, dizziness, presyncope, syncope, snoring, daytime somnolence, bleeding, or neurologic sequela. The patient is tolerating medications without difficulties and is otherwise without complaint today.    Atrial Fibrillation Risk Factors:  he does not have symptoms or diagnosis of sleep apnea. he does not have a history of rheumatic fever.   he has a BMI of Body mass index is 37.62 kg/m.Marland Kitchen Filed Weights   10/27/22 1043  Weight: 118.9 kg    Family History  Problem Relation Age of Onset   Hypertension Mother    Heart disease Mother        before age 62   Dementia Mother    Cancer Father        bone   Cancer Sister        breast   Cancer Sister        breast   CAD Other    Colon cancer Neg Hx    Rectal cancer Neg Hx    Stomach cancer Neg Hx      Atrial Fibrillation Management history:  Previous antiarrhythmic drugs: none Previous cardioversions: 2018, 2022, 04/19/22, 07/16/22, 09/23/22 Previous ablations: 09/25/22 CHADS2VASC score: 1 Anticoagulation history: Xarelto   Past Medical History:  Diagnosis Date   Abnormal stress test 05/07/2018    Actinic keratoses 05/01/2020   Arthritis    Atrial fibrillation ([place]) 11/28/2016   Atrial fibrillation with RVR ([place])    Cataract    bilateral- right removed, left present   Cervical radiculopathy 03/01/2020   Cherry angioma 05/01/2020   Chest pain syndrome 01/26/2020   Chronic diastolic CHF (congestive heart failure) ([place])    Chronic kidney disease, stage V ([place]) 61/60/7371   Complication of anesthesia    problem with bladder not waking up after surgery-for right nephrectomy   Dermatofibroma 05/01/2020   Dry skin 05/01/2020   Dysrhythmia    Encounter for adequacy testing for dialysis ([place]) 09/18/2014   Encounter for therapeutic drug monitoring 12/05/2016   Enlarged kidney    right   ESRD on dialysis ([place]) 05/26/9484   Folliculitis 03/06/2702   GERD without esophagitis 12/05/2020   Gout    History of gout 01/30/2020   HLD (hyperlipidemia) 11/28/2016   Hypertension    Hypokalemia 11/28/2016   Hypomagnesemia 01/30/2020   Immunosuppression ([place]) 01/17/2019   Immunosuppressive management encounter following kidney transplant 01/25/2019   Lapband APS July 2015 06/20/2014   Left shoulder pain 06/14/2020   Mixed hyperlipidemia 11/13/2014   Morbid obesity BMI 42 01/04/2014   Multiple benign nevi 05/01/2020   Paroxysmal A-fib ([place]) 04/22/2022   Renal disorder    Renal failure 07/13/2015   Renal insufficiency    KIDNEY DISEASE - FOLLOWED BY DR. Lorrene Reid NOTES  ON CHART   Renal insufficiency 04/22/2022   KIDNEY DISEASE - FOLLOWED BY DR. 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 12/24/2018   Serum total bilirubin elevated 01/30/2020   Skin tag 05/01/2020   Stage 3b chronic kidney disease ([place]) 01/30/2020   Status post gastric banding 06/20/2014   Status post laparoscopic cholecystectomy August 2016 07/13/2015   Status post living-donor kidney  transplantation 01/17/2019   Sun-damaged skin 05/01/2020   TIA (transient ischemic attack) 07/14/2017   Vertigo    Past Surgical History:  Procedure Laterality Date   ATRIAL FIBRILLATION ABLATION N/A 09/25/2022   Procedure: ATRIAL FIBRILLATION ABLATION;  Surgeon: Vickie Epley, MD;  Location: [facility];  Service: Cardiovascular;  Laterality: N/A;   AV FISTULA PLACEMENT     "IT CLOSED UP AND DOES NOT WORK"   AV FISTULA PLACEMENT Right 10/27/2014   Procedure: ARTERIOVENOUS (AV) FISTULA CREATION;  Surgeon: Conrad Azusa, MD;  Location: [facility];  Service: Vascular;  Laterality: Right;   AV FISTULA PLACEMENT Right 08/22/2015   Procedure: BRACHIOCEPHALIC ARTERIOVENOUS (AV) FISTULA CREATION;  Surgeon: Conrad Mylo, MD;  Location: [facility];  Service: Vascular;  Laterality: Right;   CATARACTS REMOVED Right    CHOLECYSTECTOMY N/A 07/13/2015   Procedure: LAPAROSCOPIC CHOLECYSTECTOMY WITH INTRAOPERATIVE CHOLANGIOGRAM;  Surgeon: Johnathan Hausen, MD;  [facility];  Service: General;  Laterality: N/A;   EYE SURGERY     left cataract surgery-lens implant   HERNIA REPAIR     INSERTION OF MESH N/A 06/20/2014   Procedure: INSERTION OF MESH;  Surgeon: Pedro Earls, MD;  [facility];  Service: General;  Laterality: N/A;   KNEE SURGERY  2001   LAPAROSCOPIC GASTRIC BANDING N/A 06/20/2014   Procedure: LAPAROSCOPIC GASTRIC BANDING and VENTRAL HERNIA REPAIR WITH MESH;  Surgeon: Pedro Earls, MD;  [facility];  Service: General;  Laterality: N/A;   TEE WITHOUT CARDIOVERSION N/A 09/25/2022   Procedure: TRANSESOPHAGEAL ECHOCARDIOGRAM (TEE);  Surgeon: Vickie Epley, MD;  Location: [facility];  Service: Cardiovascular;  Laterality: N/A;   TOTAL NEPHRECTOMY Right     Current Outpatient Medications  Medication Sig Dispense Refill   atorvastatin (LIPITOR) 40 MG tablet Take 1 tablet (40 mg total) by mouth daily. 30 tablet 2   diphenhydramine-acetaminophen (TYLENOL PM) 25-500 MG TABS  tablet Take 1 tablet by mouth at bedtime.     ENVARSUS XR 1 MG TB24 Take 6 mg by mouth daily.     magnesium oxide (MAG-OX) 400 MG tablet Take 400 mg by mouth in the morning and at bedtime.     metoprolol succinate (TOPROL-XL) 25 MG 24 hr tablet Take 1 tablet (25 mg total) by mouth 2 (two) times daily. 60 tablet 6   mycophenolate (MYFORTIC) 180 MG EC tablet Take 540 mg by mouth 2 (two) times daily.     omeprazole (PRILOSEC) 20 MG capsule Take 20 mg by mouth daily as needed (acid reflux).     pantoprazole (PROTONIX) 40 MG tablet Take 1 tablet (40 mg total) by mouth daily. 45 tablet 0   predniSONE (DELTASONE) 5 MG tablet Take 5 mg by mouth daily.     rivaroxaban (XARELTO) 20 MG TABS tablet Take 1 tablet (20 mg total) by mouth daily with supper. 90 tablet 3   sulfamethoxazole-trimethoprim (BACTRIM) 400-80 MG tablet  Take 1 tablet by mouth every Monday, Wednesday, and Friday.     No current facility-administered medications for this encounter.    No Known Allergies  Social History   Socioeconomic History   Marital status: Married    Spouse name: Not on file   Number of children: 12   Years of education: 2   Highest education level: Not on file  Occupational History   Occupation: Retired  Tobacco Use   Smoking status: Never   Smokeless tobacco: Never  Vaping Use   Vaping Use: Never used  Substance and Sexual Activity   Alcohol use: No    Alcohol/week: 0.0 standard drinks of alcohol   Drug use: No   Sexual activity: Not on file  Other Topics Concern   Not on file  Social History Narrative   Lives at home with his wife.   Right-handed.   No caffeine use.   Social Determinants of Health   Financial Resource Strain: Not on file  Food Insecurity: Not on file  Transportation Needs: Not on file  Physical Activity: Not on file  Stress: Not on file  Social Connections: Not on file  Intimate Partner Violence: Not on file     ROS- All systems are reviewed and negative except as  per the HPI above.  Physical Exam: Vitals:   10/27/22 1043  BP: (!) 148/80  Pulse: 65  Weight: 118.9 kg  Height: '5\' 10"'$  (1.778 m)    GEN- The patient is a well appearing obese male, alert and oriented x 3 today.   Head- normocephalic, atraumatic Eyes-  Sclera clear, conjunctiva pink Ears- hearing intact Oropharynx- clear Neck- supple  Lungs- Clear to ausculation bilaterally, normal work of breathing Heart- Regular rate and rhythm, no murmurs, rubs or gallops  GI- soft, NT, ND, + BS Extremities- no clubbing, cyanosis, or edema MS- no significant deformity or atrophy Skin- no rash or lesion Psych- euthymic mood, full affect Neuro- strength and sensation are intact  Wt Readings from Last 3 Encounters:  10/27/22 118.9 kg  09/25/22 120.2 kg  09/23/22 120.2 kg    EKG today demonstrates  SR Vent. rate 66 BPM PR interval 130 ms QRS duration 110 ms QT/QTcB 428/448 ms  Echo 12/20/21 demonstrated  1. Left ventricular ejection fraction, by estimation, is 60 to 65%. The  left ventricle has normal function. The left ventricle has no regional  wall motion abnormalities. There is mild left ventricular hypertrophy.  Left ventricular diastolic parameters are consistent with Grade II diastolic dysfunction (pseudonormalization).   2. Right ventricular systolic function is normal. The right ventricular  size is normal. There is moderately elevated pulmonary artery systolic  pressure. The estimated right ventricular systolic pressure is 40.9 mmHg.   3. Left atrial size was moderately dilated.   4. The mitral valve is normal in structure. Mild mitral valve  regurgitation. No evidence of mitral stenosis.   5. The aortic valve has an indeterminant number of cusps. Unable to  exclude bicuspid aortic valve. Aortic valve regurgitation is not  visualized. No aortic stenosis is present.   6. There is mild dilatation of the aortic root, measuring 41 mm. There is  borderline dilatation of the  ascending aorta, measuring 37 mm.   7. The inferior vena cava is normal in size with greater than 50%  respiratory variability, suggesting right atrial pressure of 3 mmHg.   Epic records are reviewed at length today  CHA2DS2-VASc Score = 1  The patient's score is based  upon: CHF History: 0 HTN History: 1 Diabetes History: 0 Stroke History: 0 (patient denies history of TIA) Vascular Disease History: 0 Age Score: 0 Gender Score: 0       ASSESSMENT AND PLAN: 1. Persistent Atrial Fibrillation (ICD10:  I48.19) The patient's CHA2DS2-VASc score is 1, indicating a 0.6% annual risk of stroke.   S/p afib ablation 09/25/22 Patient appears to be maintaining SR. Continue Toprol 25 mg BID Continue Xarelto 20 mg daily with no missed doses for 3 months post ablation.   2. Obesity Body mass index is 37.62 kg/m. Lifestyle modification was discussed at length including regular exercise and weight reduction.  3. HTN Stable, no changes today.   Follow up with Dr Quentin Ore as scheduled.    [facility] [address] [place], [place] [address] [phone] 10/27/2022 12:07 PM

## 2022-11-13 ENCOUNTER — Other Ambulatory Visit: Payer: Self-pay | Admitting: Physician Assistant

## 2022-12-28 NOTE — Progress Notes (Unsigned)
Electrophysiology Office Follow up Visit Note:    Date:  12/28/2022   ID:  [name] [name], DOB [birth year], MRN [id number]  PCP:  Gara Kroner, DO  [place] Cardiologist:  Kate Sable, MD  [facility] Electrophysiologist:  Vickie Epley, MD    Interval History:    [name] [name] is a 62 y.o. male who presents for a follow up visit.   He had an AF ablation 09/25/2022. During the ablation, the veins were isolated.  He saw Audry Pili 10/27/2022 and was doing well without recurrence of AF.       Past Medical History:  Diagnosis Date   Abnormal stress test 05/07/2018   Actinic keratoses 05/01/2020   Arthritis    Atrial fibrillation ([place]) 11/28/2016   Atrial fibrillation with RVR ([facility])    Cataract    bilateral- right removed, left present   Cervical radiculopathy 03/01/2020   Cherry angioma 05/01/2020   Chest pain syndrome 01/26/2020   Chronic diastolic CHF (congestive heart failure) ([place])    Chronic kidney disease, stage V ([place]) 02/72/5366   Complication of anesthesia    problem with bladder not waking up after surgery-for right nephrectomy   Dermatofibroma 05/01/2020   Dry skin 05/01/2020   Dysrhythmia    Encounter for adequacy testing for dialysis ([place]) 09/18/2014   Encounter for therapeutic drug monitoring 12/05/2016   Enlarged kidney    right   ESRD on dialysis ([place]) 44/01/4741   Folliculitis 04/08/5637   GERD without esophagitis 12/05/2020   Gout    History of gout 01/30/2020   HLD (hyperlipidemia) 11/28/2016   Hypertension    Hypokalemia 11/28/2016   Hypomagnesemia 01/30/2020   Immunosuppression ([place]) 01/17/2019   Immunosuppressive management encounter following kidney transplant 01/25/2019   Lapband APS July 2015 06/20/2014   Left shoulder pain 06/14/2020   Mixed hyperlipidemia 11/13/2014   Morbid obesity BMI 42 01/04/2014   Multiple benign nevi 05/01/2020   Paroxysmal A-fib ([place]) 04/22/2022   Renal disorder    Renal failure 07/13/2015   Renal insufficiency     KIDNEY DISEASE - FOLLOWED BY DR. Lorrene Reid NOTES ON CHART   Renal insufficiency 04/22/2022   KIDNEY DISEASE - FOLLOWED BY DR. 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 12/24/2018   Serum total bilirubin elevated 01/30/2020   Skin tag 05/01/2020   Stage 3b chronic kidney disease ([place]) 01/30/2020   Status post gastric banding 06/20/2014   Status post laparoscopic cholecystectomy August 2016 07/13/2015   Status post living-donor kidney transplantation 01/17/2019   Sun-damaged skin 05/01/2020   TIA (transient ischemic attack) 07/14/2017   Vertigo     Past Surgical History:  Procedure Laterality Date   ATRIAL FIBRILLATION ABLATION N/A 09/25/2022   Procedure: ATRIAL FIBRILLATION ABLATION;  Surgeon: Vickie Epley, MD;  Location: [facility];  Service: Cardiovascular;  Laterality: N/A;   AV FISTULA PLACEMENT     "IT CLOSED UP AND DOES NOT WORK"   AV FISTULA PLACEMENT Right 10/27/2014   Procedure: ARTERIOVENOUS (AV) FISTULA CREATION;  Surgeon: Conrad Quitman, MD;  Location: [facility];  Service: Vascular;  Laterality: Right;   AV FISTULA PLACEMENT Right 08/22/2015   Procedure: BRACHIOCEPHALIC ARTERIOVENOUS (AV) FISTULA CREATION;  Surgeon: Conrad Eden, MD;  Location: [facility];  Service: Vascular;  Laterality: Right;   CATARACTS REMOVED Right  CHOLECYSTECTOMY N/A 07/13/2015   Procedure: LAPAROSCOPIC CHOLECYSTECTOMY WITH INTRAOPERATIVE CHOLANGIOGRAM;  Surgeon: Johnathan Hausen, MD;  [facility];  Service: General;  Laterality: N/A;   EYE SURGERY     left cataract surgery-lens implant   HERNIA REPAIR     INSERTION OF MESH N/A 06/20/2014   Procedure: INSERTION OF MESH;  Surgeon: Pedro Earls, MD;  [facility];  Service: General;  Laterality: N/A;   KNEE SURGERY  2001   LAPAROSCOPIC GASTRIC BANDING N/A 06/20/2014   Procedure:  LAPAROSCOPIC GASTRIC BANDING and VENTRAL HERNIA REPAIR WITH MESH;  Surgeon: Pedro Earls, MD;  [facility];  Service: General;  Laterality: N/A;   TEE WITHOUT CARDIOVERSION N/A 09/25/2022   Procedure: TRANSESOPHAGEAL ECHOCARDIOGRAM (TEE);  Surgeon: Vickie Epley, MD;  Location: [facility];  Service: Cardiovascular;  Laterality: N/A;   TOTAL NEPHRECTOMY Right     Current Medications: No outpatient medications have been marked as taking for the 12/29/22 encounter (Office Visit) with Vickie Epley, MD.     Allergies:   Patient has no known allergies.   Social History   Socioeconomic History   Marital status: Married    Spouse name: Not on file   Number of children: 12   Years of education: 2   Highest education level: Not on file  Occupational History   Occupation: Retired  Tobacco Use   Smoking status: Never   Smokeless tobacco: Never  Vaping Use   Vaping Use: Never used  Substance and Sexual Activity   Alcohol use: No    Alcohol/week: 0.0 standard drinks of alcohol   Drug use: No   Sexual activity: Not on file  Other Topics Concern   Not on file  Social History Narrative   Lives at home with his wife.   Right-handed.   No caffeine use.   Social Determinants of Health   Financial Resource Strain: Not on file  Food Insecurity: Not on file  Transportation Needs: Not on file  Physical Activity: Not on file  Stress: Not on file  Social Connections: Not on file     Family History: The patient's family history includes CAD in an other family member; Cancer in his father, sister, and sister; Dementia in his mother; Heart disease in his mother; Hypertension in his mother. There is no history of Colon cancer, Rectal cancer, or Stomach cancer.  ROS:   Please see the history of present illness.    All other systems reviewed and are negative.  EKGs/Labs/Other Studies Reviewed:    The following studies were reviewed today: ***  EKG:  The ekg  ordered today demonstrates ***  Recent Labs: 04/23/2022: TSH 1.715 07/16/2022: B Natriuretic Peptide 230.1 09/23/2022: BUN 19; Creatinine, Ser 1.86; Hemoglobin 13.7; Magnesium 1.7; Platelets 143; Potassium 4.1; Sodium 140  Recent Lipid Panel    Component Value Date/Time   CHOL 187 11/20/2021 0809   TRIG 178 (H) 11/20/2021 0809   HDL 38 (L) 11/20/2021 0809   CHOLHDL 4.9 11/20/2021 0809   CHOLHDL 3.1 11/28/2016 0346   VLDL 11 11/28/2016 0346   LDLCALC 117 (H) 11/20/2021 0809    Physical Exam:    VS:  There were no vitals taken for this visit.    Wt Readings from Last 3 Encounters:  10/27/22 262 lb 3.2 oz (118.9 kg)  09/25/22 265 lb (120.2 kg)  09/23/22 265 lb (120.2 kg)     GEN: *** Well nourished, well developed in no acute distress HEENT: Normal  NECK: No JVD; No carotid bruits LYMPHATICS: No lymphadenopathy CARDIAC: ***RRR, no murmurs, rubs, gallops RESPIRATORY:  Clear to auscultation without rales, wheezing or rhonchi  ABDOMEN: Soft, non-tender, non-distended MUSCULOSKELETAL:  No edema; No deformity  SKIN: Warm and dry NEUROLOGIC:  Alert and oriented x 3 PSYCHIATRIC:  Normal affect        ASSESSMENT:    No diagnosis found. PLAN:    In order of problems listed above:    #pAF Doing well after 09/25/2022 ablation.  CHA2DS2-VASc Score = 4  The patient's score is based upon: CHF History: 1 HTN History: 1 Diabetes History: 0 Stroke History: 2 Vascular Disease History: 0 Age Score: 0 Gender Score: 0  Continue xarelto.   #Chronic diastolic HF NYHA II. Warm and dry. Continue GDMT.  Follow up 1 year with APP.     Medication Adjustments/Labs and Tests Ordered: Current medicines are reviewed at length with the patient today.  Concerns regarding medicines are outlined above.  No orders of the defined types were placed in this encounter.  No orders of the defined types were placed in this encounter.    Signed, Lars Mage, MD, [facility],  [facility] 12/28/2022 7:52 PM    Electrophysiology [facility]

## 2022-12-29 ENCOUNTER — Ambulatory Visit: Payer: PPO | Attending: Cardiology | Admitting: Cardiology

## 2022-12-29 ENCOUNTER — Encounter: Payer: Self-pay | Admitting: Cardiology

## 2022-12-29 VITALS — BP 126/86 | HR 62 | Ht 70.0 in | Wt 258.0 lb

## 2022-12-29 DIAGNOSIS — I4819 Other persistent atrial fibrillation: Secondary | ICD-10-CM | POA: Diagnosis not present

## 2022-12-29 DIAGNOSIS — G459 Transient cerebral ischemic attack, unspecified: Secondary | ICD-10-CM

## 2022-12-29 DIAGNOSIS — I5032 Chronic diastolic (congestive) heart failure: Secondary | ICD-10-CM

## 2022-12-29 NOTE — Progress Notes (Signed)
Electrophysiology Office Follow up Visit Note:    Date:  12/29/2022   ID:  [name] [name], DOB 08-Jun-1961, MRN [id number]  PCP:  Gara Kroner, DO  [place] Cardiologist:  Kate Sable, MD  [facility] Electrophysiologist:  Vickie Epley, MD    Interval History:    [name] [name] is a 62 y.o. male who presents for a follow up visit.   He had an AF ablation 09/25/2022. During the ablation, the veins were isolated.  He saw Audry Pili 10/27/2022 and was doing well without recurrence of AF.  Today, he reports a couple episodes of a racing heart beat, which lasts for a second at a time. He also complains of positional SOB when lying down.  He denies any chest pain or peripheral edema. No lightheadedness, headaches, syncope, orthopnea.  Past Medical History:  Diagnosis Date   Abnormal stress test 05/07/2018   Actinic keratoses 05/01/2020   Arthritis    Atrial fibrillation ([place]) 11/28/2016   Atrial fibrillation with RVR (HCC)    Cataract    bilateral- right removed, left present   Cervical radiculopathy 03/01/2020   Cherry angioma 05/01/2020   Chest pain syndrome 01/26/2020   Chronic diastolic CHF (congestive heart failure) ([place])    Chronic kidney disease, stage V ([place]) 68/07/8109   Complication of anesthesia    problem with bladder not waking up after surgery-for right nephrectomy   Dermatofibroma 05/01/2020   Dry skin 05/01/2020   Dysrhythmia    Encounter for adequacy testing for dialysis ([place]) 09/18/2014   Encounter for therapeutic drug monitoring 12/05/2016   Enlarged kidney    right   ESRD on dialysis ([place]) 31/59/4585   Folliculitis 08/03/9243   GERD without esophagitis 12/05/2020   Gout    History of gout 01/30/2020   HLD (hyperlipidemia) 11/28/2016   Hypertension    Hypokalemia 11/28/2016   Hypomagnesemia 01/30/2020   Immunosuppression ([place]) 01/17/2019   Immunosuppressive management encounter following kidney transplant 01/25/2019   Lapband APS July 2015  06/20/2014   Left shoulder pain 06/14/2020   Mixed hyperlipidemia 11/13/2014   Morbid obesity BMI 42 01/04/2014   Multiple benign nevi 05/01/2020   Paroxysmal A-fib ([place]) 04/22/2022   Renal disorder    Renal failure 07/13/2015   Renal insufficiency    KIDNEY DISEASE - FOLLOWED BY DR. Lorrene Reid NOTES ON CHART   Renal insufficiency 04/22/2022   KIDNEY DISEASE - FOLLOWED BY DR. 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 12/24/2018   Serum total bilirubin elevated 01/30/2020   Skin tag 05/01/2020   Stage 3b chronic kidney disease ([place]) 01/30/2020   Status post gastric banding 06/20/2014   Status post laparoscopic cholecystectomy August 2016 07/13/2015   Status post living-donor kidney transplantation 01/17/2019   Sun-damaged skin 05/01/2020   TIA (transient ischemic attack) 07/14/2017   Vertigo     Past Surgical History:  Procedure Laterality Date   ATRIAL FIBRILLATION ABLATION N/A 09/25/2022   Procedure: ATRIAL FIBRILLATION ABLATION;  Surgeon: Vickie Epley, MD;  Location: [place];  Service: Cardiovascular;  Laterality: N/A;   AV FISTULA PLACEMENT     "IT CLOSED UP AND DOES NOT WORK"   AV FISTULA PLACEMENT Right 10/27/2014   Procedure: ARTERIOVENOUS (AV) FISTULA CREATION;  Surgeon: Conrad Hickory, MD;  Location: [place];  Service: Vascular;  Laterality: Right;  AV FISTULA PLACEMENT Right 08/22/2015   Procedure: BRACHIOCEPHALIC ARTERIOVENOUS (AV) FISTULA CREATION;  Surgeon: Conrad [place], MD;  Location: [place];  Service: Vascular;  Laterality: Right;   CATARACTS REMOVED Right    CHOLECYSTECTOMY N/A 07/13/2015   Procedure: LAPAROSCOPIC CHOLECYSTECTOMY WITH INTRAOPERATIVE CHOLANGIOGRAM;  Surgeon: Johnathan Hausen, MD;  Location: [facility];  Service: General;  Laterality: N/A;   EYE SURGERY     left cataract surgery-lens implant    HERNIA REPAIR     INSERTION OF MESH N/A 06/20/2014   Procedure: INSERTION OF MESH;  Surgeon: Pedro Earls, MD;  Location: [facility];  Service: General;  Laterality: N/A;   KNEE SURGERY  2001   LAPAROSCOPIC GASTRIC BANDING N/A 06/20/2014   Procedure: LAPAROSCOPIC GASTRIC BANDING and VENTRAL HERNIA REPAIR WITH MESH;  Surgeon: Pedro Earls, MD;  Location: [facility];  Service: General;  Laterality: N/A;   TEE WITHOUT CARDIOVERSION N/A 09/25/2022   Procedure: TRANSESOPHAGEAL ECHOCARDIOGRAM (TEE);  Surgeon: Vickie Epley, MD;  Location: [place];  Service: Cardiovascular;  Laterality: N/A;   TOTAL NEPHRECTOMY Right     Current Medications: Current Meds  Medication Sig   atorvastatin (LIPITOR) 40 MG tablet Take 1 tablet (40 mg total) by mouth daily.   diphenhydramine-acetaminophen (TYLENOL PM) 25-500 MG TABS tablet Take 1 tablet by mouth at bedtime.   ENVARSUS XR 1 MG TB24 Take 6 mg by mouth daily.   magnesium oxide (MAG-OX) 400 MG tablet Take 400 mg by mouth in the morning and at bedtime.   metoprolol succinate (TOPROL-XL) 25 MG 24 hr tablet Take 1 tablet (25 mg total) by mouth 2 (two) times daily.   mycophenolate (MYFORTIC) 180 MG EC tablet Take 540 mg by mouth 2 (two) times daily.   omeprazole (PRILOSEC) 20 MG capsule Take 20 mg by mouth daily as needed (acid reflux).   pantoprazole (PROTONIX) 40 MG tablet Take 1 tablet (40 mg total) by mouth daily.   predniSONE (DELTASONE) 5 MG tablet Take 5 mg by mouth daily.   rivaroxaban (XARELTO) 20 MG TABS tablet Take 1 tablet (20 mg total) by mouth daily with supper.   sulfamethoxazole-trimethoprim (BACTRIM) 400-80 MG tablet Take 1 tablet by mouth every Monday, Wednesday, and Friday.     Allergies:   Patient has no known allergies.   Social History   Socioeconomic History   Marital status: Married    Spouse name: Not on file   Number of children: 12   Years of education: 2   Highest education level: Not on file  Occupational  History   Occupation: Retired  Tobacco Use   Smoking status: Never   Smokeless tobacco: Never  Vaping Use   Vaping Use: Never used  Substance and Sexual Activity   Alcohol use: No    Alcohol/week: 0.0 standard drinks of alcohol   Drug use: No   Sexual activity: Not on file  Other Topics Concern   Not on file  Social History Narrative   Lives at home with his wife.   Right-handed.   No caffeine use.   Social Determinants of Health   Financial Resource Strain: Not on file  Food Insecurity: Not on file  Transportation Needs: Not on file  Physical Activity: Not on file  Stress: Not on file  Social Connections: Not on file     Family History: The patient's family history includes CAD in an other family member; Cancer in his father, sister, and sister; Dementia in his mother; Heart disease  in his mother; Hypertension in his mother. There is no history of Colon cancer, Rectal cancer, or Stomach cancer.  ROS:   Please see the history of present illness.    (+)positional SOB with lying down (+) palpitations All other systems reviewed and are negative.  EKGs/Labs/Other Studies Reviewed:    The following studies were reviewed today:   EKG: EKG is personally reviewed. 12/29/22: Sinus rhythm  Recent Labs: 04/23/2022: TSH 1.715 07/16/2022: B Natriuretic Peptide 230.1 09/23/2022: BUN 19; Creatinine, Ser 1.86; Hemoglobin 13.7; Magnesium 1.7; Platelets 143; Potassium 4.1; Sodium 140  Recent Lipid Panel    Component Value Date/Time   CHOL 187 11/20/2021 0809   TRIG 178 (H) 11/20/2021 0809   HDL 38 (L) 11/20/2021 0809   CHOLHDL 4.9 11/20/2021 0809   CHOLHDL 3.1 11/28/2016 0346   VLDL 11 11/28/2016 0346   LDLCALC 117 (H) 11/20/2021 0809    Physical Exam:    VS:  BP 126/86   Pulse 62   Ht '5\' 10"'$  (1.778 m)   Wt 258 lb (117 kg)   SpO2 99%   BMI 37.02 kg/m     Wt Readings from Last 3 Encounters:  12/29/22 258 lb (117 kg)  10/27/22 262 lb 3.2 oz (118.9 kg)  09/25/22 265  lb (120.2 kg)     GEN:  Well nourished, well developed in no acute distress CARDIAC: RRR. Murmur throughout precordium originating at the av fistula. RESPIRATORY:  Clear to auscultation without rales, wheezing or rhonchi  PSYCHIATRIC:  Normal affect        ASSESSMENT:    1. Persistent atrial fibrillation ([place])   2. Chronic diastolic CHF (congestive heart failure) ([place])   3. TIA (transient ischemic attack)    PLAN:    In order of problems listed above:    #pAF Doing well after 09/25/2022 ablation.  No recurrence.  Continue Xarelto for now.  Given the patient's chronic steroid use and predisposition to injury/bleeding he is very interested in avoiding long-term exposure to anticoagulation.  He is interested in pursuing left atrial appendage occlusion.  I discussed the procedure in detail include the risks and recovery and he wishes to proceed.  CHA2DS2-VASc Score = 4  The patient's score is based upon: CHF History: 1 HTN History: 1 Diabetes History: 0 Stroke History: 2 Vascular Disease History: 0 Age Score: 0 Gender Score: 0  I have seen [name] [name] in the office today who is being considered for a Watchman left atrial appendage closure device. I believe they will benefit from this procedure given their history of atrial fibrillation, CHA2DS2-VASc score of 4 and unadjusted ischemic stroke rate of 4.8% per year. Unfortunately, the patient is not felt to be a long term anticoagulation candidate secondary to chronic steroid use and a strong desire to avoid long-term exposure anticoagulation.. The patient's chart has been reviewed and I feel that they would be a candidate for short term oral anticoagulation after Watchman implant.   It is my belief that after undergoing a LAA closure procedure, [name] [name] will not need long term anticoagulation which eliminates anticoagulation side effects and major bleeding risk.   Procedural risks for the Watchman implant have been  reviewed with the patient including a 0.5% risk of stroke, <1% risk of perforation and <1% risk of device embolization. Other risks include bleeding, vascular damage, tamponade, worsening renal function, and death. The patient understands these risk and wishes to proceed.     The published clinical data on the safety  and effectiveness of WATCHMAN include but are not limited to the following: - Holmes DR, Mechele Claude, Sick P et al. for the PROTECT AF Investigators. Percutaneous closure of the left atrial appendage versus warfarin therapy for prevention of stroke in patients with atrial fibrillation: a randomised non-inferiority trial. Lancet 2009; 374: 534-42. Mechele Claude, Doshi SK, Abelardo Diesel D et al. on behalf of the PROTECT AF Investigators. Percutaneous Left Atrial Appendage Closure for Stroke Prophylaxis in Patients With Atrial Fibrillation 2.3-Year Follow-up of the PROTECT AF (Watchman Left Atrial Appendage System for Embolic Protection in Patients With Atrial Fibrillation) Trial. Circulation 2013; 127:720-729. - Alli O, Doshi S,  Kar S, Reddy VY, Sievert H et al. Quality of Life Assessment in the Randomized PROTECT AF (Percutaneous Closure of the Left Atrial Appendage Versus Warfarin Therapy for Prevention of Stroke in Patients With Atrial Fibrillation) Trial of Patients at Risk for Stroke With Nonvalvular Atrial Fibrillation. J Am Coll Cardiol 2013; 77:9390-3. Vertell Limber DR, Tarri Abernethy, Price M, [place], Sievert H, Doshi S, Huber K, Reddy V. Prospective randomized evaluation of the Watchman left atrial appendage Device in patients with atrial fibrillation versus long-term warfarin therapy; the PREVAIL trial. Journal of the [employer] of Cardiology, Vol. 4, No. 1, 2014, 1-11. - Kar S, Doshi SK, Sadhu A, Horton R, Osorio J et al. Primary outcome evaluation of a next-generation left atrial appendage closure device: results from the PINNACLE FLX trial. Circulation 2021;143(18)1754-1762.    After  today's visit with the patient which was dedicated solely for shared decision making visit regarding LAA closure device, the patient decided to proceed with the LAA appendage closure procedure scheduled to be done in the near future at [facility].  Given the presence of a transplanted kidney, we will plan for TEE on the table prior to opening any sheaths.  He will not get a CT scan before the procedure.   HAS-BLED score 4 Hypertension Yes  Abnormal renal and liver function (Dialysis, transplant, Cr >2.26 mg/dL /Cirrhosis or Bilirubin >2x Normal or AST/ALT/AP >3x Normal) Yes  Stroke Yes  Bleeding No  Labile INR (Unstable/high INR) No  Elderly (>65) No  Drugs or alcohol (? 8 drinks/week, anti-plt or NSAID) Yes   CHA2DS2-VASc Score = 4  The patient's score is based upon: CHF History: 1 HTN History: 1 Diabetes History: 0 Stroke History: 2 Vascular Disease History: 0 Age Score: 0 Gender Score: 0     #Chronic diastolic HF NYHA II. Warm and dry. Continue GDMT.    Medication Adjustments/Labs and Tests Ordered: Current medicines are reviewed at length with the patient today.  Concerns regarding medicines are outlined above.  Orders Placed This Encounter  Procedures   EKG 12-Lead   No orders of the defined types were placed in this encounter.   I,Mitra Faeizi,acting as a Education administrator for Vickie Epley, MD.,have documented all relevant documentation on the behalf of Vickie Epley, MD,as directed by  Vickie Epley, MD while in the presence of Vickie Epley, MD.  I, Vickie Epley, MD, have reviewed all documentation for this visit. The documentation on 12/29/22 for the exam, diagnosis, procedures, and orders are all accurate and complete.   Signed, Lars Mage, MD, [facility], [facility] 12/29/2022 11:53 AM    Electrophysiology  [facility]

## 2022-12-29 NOTE — Patient Instructions (Signed)
Medication Instructions:  Your physician recommends that you continue on your current medications as directed. Please refer to the Current Medication list given to you today.  *If you need a refill on your cardiac medications before your next appointment, please call your pharmacy*  Follow-Up: At [facility], you and your health needs are our priority.  As part of our continuing mission to provide you with exceptional heart care, we have created designated Provider Care Teams.  These Care Teams include your primary Cardiologist (physician) and Advanced Practice Providers (APPs -  Physician Assistants and Nurse Practitioners) who all work together to provide you with the care you need, when you need it.  Your next appointment:   The Watchman Nurse Navigator, Lenice Llamas, RN will be in contact with you to arrange next steps. She can be reached at [phone].

## 2023-01-07 ENCOUNTER — Other Ambulatory Visit: Payer: Self-pay

## 2023-01-07 DIAGNOSIS — I4819 Other persistent atrial fibrillation: Secondary | ICD-10-CM

## 2023-01-07 NOTE — Telephone Encounter (Signed)
Spoke with the patient on the phone. He still has occasional SOB (he reported this to Dr. Quentin Ore at his appointment 1/29) and wishes to wait some time for the procedure. Per his request, scheduled him for LAAO 04/23/2023 with pre-procedure visit 4/29. Per Dr. Mardene Speak note, no CT is needed and TEE will be done DOS. He was grateful for call and agreed with plan.

## 2023-01-09 ENCOUNTER — Ambulatory Visit: Payer: PPO | Admitting: Nurse Practitioner

## 2023-01-09 ENCOUNTER — Ambulatory Visit: Payer: PPO | Attending: Cardiology | Admitting: Cardiology

## 2023-01-09 ENCOUNTER — Encounter: Payer: Self-pay | Admitting: Cardiology

## 2023-01-09 VITALS — BP 132/80 | HR 58 | Ht 70.0 in | Wt 256.2 lb

## 2023-01-09 DIAGNOSIS — I4819 Other persistent atrial fibrillation: Secondary | ICD-10-CM | POA: Diagnosis not present

## 2023-01-09 DIAGNOSIS — E782 Mixed hyperlipidemia: Secondary | ICD-10-CM

## 2023-01-09 DIAGNOSIS — I10 Essential (primary) hypertension: Secondary | ICD-10-CM | POA: Diagnosis not present

## 2023-01-09 NOTE — Progress Notes (Signed)
Cardiology Office Note:    Date:  01/09/2023   ID:  ZACK HUNSAKER, DOB [birth year], MRN [id number]  PCP:  Gara Kroner, DO   [place] [facility] Cardiologist:  Kate Sable, MD Electrophysiologist:  Vickie Epley, MD     Referring MD: Gara Kroner, DO   Chief Complaint  Patient presents with   Follow-up    Annual F/U, watchman questions, SOB    History of Present Illness:    [name] [name] is a 62 y.o. male with a hx of hypertension, hyperlipidemia, persistent atrial fibrillation (DCCV 2018, 10/2021), RFA 10/23, ESRD previously on HD now s/p renal transplant 2019 presenting for follow-up.  He takes steroids chronically, predisposing him to bleeding while on anticoagulation.  He bleeds significantly with minimal trauma.  Considering left atrial appendage occlusion/watchman's procedure.  Evaluated by EP for watchman, deemed candidate.  Had some shortness of breath after AF ablation, but this is improving.  Wants to wait for breathing to improve prior to scheduling watchman procedure.   Prior notes Echo 12/2021 EF 60 to 65%, mild MR, moderately dilated LA. Echocardiogram 2017 EF 60 to 65%, mildly dilated LA.  left heart cath 2019 at [facility] left dominant, no significant CAD small nondominant RCA.    Past Medical History:  Diagnosis Date   Abnormal stress test 05/07/2018   Actinic keratoses 05/01/2020   Arthritis    Atrial fibrillation ([place]) 11/28/2016   Atrial fibrillation with RVR (HCC)    Cataract    bilateral- right removed, left present   Cervical radiculopathy 03/01/2020   Cherry angioma 05/01/2020   Chest pain syndrome 01/26/2020   Chronic diastolic CHF (congestive heart failure) ([place])    Chronic kidney disease, stage V ([place]) [id number]   Complication of anesthesia    problem with bladder not waking up after surgery-for right nephrectomy   Dermatofibroma 05/01/2020   Dry skin 05/01/2020   Dysrhythmia    Encounter for adequacy testing for  dialysis ([place]) 09/18/2014   Encounter for therapeutic drug monitoring 12/05/2016   Enlarged kidney    right   ESRD on dialysis ([place]) [id number]   Folliculitis 0000000   GERD without esophagitis 12/05/2020   Gout    History of gout 01/30/2020   HLD (hyperlipidemia) 11/28/2016   Hypertension    Hypokalemia 11/28/2016   Hypomagnesemia 01/30/2020   Immunosuppression ([place]) 01/17/2019   Immunosuppressive management encounter following kidney transplant 01/25/2019   Lapband APS July 2015 06/20/2014   Left shoulder pain 06/14/2020   Mixed hyperlipidemia 11/13/2014   Morbid obesity BMI 42 01/04/2014   Multiple benign nevi 05/01/2020   Paroxysmal A-fib ([place]) 04/22/2022   Renal disorder    Renal failure 07/13/2015   Renal insufficiency    KIDNEY DISEASE - FOLLOWED BY DR. Lorrene Reid NOTES ON CHART   Renal insufficiency 04/22/2022   KIDNEY DISEASE - FOLLOWED BY DR. 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 12/24/2018   Serum total bilirubin elevated 01/30/2020   Skin tag 05/01/2020   Stage 3b chronic kidney disease ([place]) 01/30/2020   Status post gastric banding 06/20/2014   Status post laparoscopic cholecystectomy August 2016 07/13/2015   Status post living-donor kidney transplantation 01/17/2019   Sun-damaged skin 05/01/2020   TIA (transient ischemic attack) 07/14/2017   Vertigo     Past Surgical History:  Procedure Laterality Date  ATRIAL FIBRILLATION ABLATION N/A 09/25/2022   Procedure: ATRIAL FIBRILLATION ABLATION;  Surgeon: Vickie Epley, MD;  Location: [place] [facility];  Service: Cardiovascular;  Laterality: N/A;   AV FISTULA PLACEMENT     "IT CLOSED UP AND DOES NOT WORK"   AV FISTULA PLACEMENT Right 10/27/2014   Procedure: ARTERIOVENOUS (AV) FISTULA CREATION;  Surgeon: Conrad Daisy, MD;  Location: [place] [facility];  Service: Vascular;   Laterality: Right;   AV FISTULA PLACEMENT Right 08/22/2015   Procedure: BRACHIOCEPHALIC ARTERIOVENOUS (AV) FISTULA CREATION;  Surgeon: Conrad , MD;  Location: [place];  Service: Vascular;  Laterality: Right;   CATARACTS REMOVED Right    CHOLECYSTECTOMY N/A 07/13/2015   Procedure: LAPAROSCOPIC CHOLECYSTECTOMY WITH INTRAOPERATIVE CHOLANGIOGRAM;  Surgeon: Johnathan Hausen, MD;  Location: [facility];  Service: General;  Laterality: N/A;   EYE SURGERY     left cataract surgery-lens implant   HERNIA REPAIR     INSERTION OF MESH N/A 06/20/2014   Procedure: INSERTION OF MESH;  Surgeon: Pedro Earls, MD;  Location: [facility];  Service: General;  Laterality: N/A;   KNEE SURGERY  2001   LAPAROSCOPIC GASTRIC BANDING N/A 06/20/2014   Procedure: LAPAROSCOPIC GASTRIC BANDING and VENTRAL HERNIA REPAIR WITH MESH;  Surgeon: Pedro Earls, MD;  Location: [facility];  Service: General;  Laterality: N/A;   TEE WITHOUT CARDIOVERSION N/A 09/25/2022   Procedure: TRANSESOPHAGEAL ECHOCARDIOGRAM (TEE);  Surgeon: Vickie Epley, MD;  Location: [place] [facility];  Service: Cardiovascular;  Laterality: N/A;   TOTAL NEPHRECTOMY Right     Current Medications: Current Meds  Medication Sig   atorvastatin (LIPITOR) 40 MG tablet Take 1 tablet (40 mg total) by mouth daily.   diphenhydramine-acetaminophen (TYLENOL PM) 25-500 MG TABS tablet Take 1 tablet by mouth at bedtime.   ENVARSUS XR 1 MG TB24 Take 6 mg by mouth daily.   magnesium oxide (MAG-OX) 400 MG tablet Take 400 mg by mouth in the morning and at bedtime.   metoprolol succinate (TOPROL-XL) 25 MG 24 hr tablet Take 1 tablet (25 mg total) by mouth 2 (two) times daily.   mycophenolate (MYFORTIC) 180 MG EC tablet Take 540 mg by mouth 2 (two) times daily.   pantoprazole (PROTONIX) 40 MG tablet Take 1 tablet (40 mg total) by mouth daily.   predniSONE (DELTASONE) 5 MG tablet Take 5 mg by mouth daily.   rivaroxaban (XARELTO) 20 MG TABS tablet Take 1 tablet (20 mg total) by  mouth daily with supper.   sulfamethoxazole-trimethoprim (BACTRIM) 400-80 MG tablet Take 1 tablet by mouth every Monday, Wednesday, and Friday.     Allergies:   Patient has no known allergies.   Social History   Socioeconomic History   Marital status: Married    Spouse name: Not on file   Number of children: 12   Years of education: 2   Highest education level: Not on file  Occupational History   Occupation: Retired  Tobacco Use   Smoking status: Never    Passive exposure: Never   Smokeless tobacco: Never  Vaping Use   Vaping Use: Never used  Substance and Sexual Activity   Alcohol use: No    Alcohol/week: 0.0 standard drinks of alcohol   Drug use: No   Sexual activity: Not on file  Other Topics Concern   Not on file  Social History Narrative   Lives at home with his wife.   Right-handed.   No caffeine use.   Social Determinants of Health  Financial Resource Strain: Not on file  Food Insecurity: Not on file  Transportation Needs: Not on file  Physical Activity: Not on file  Stress: Not on file  Social Connections: Not on file     Family History: The patient's family history includes CAD in an other family member; Cancer in his father, sister, and sister; Dementia in his mother; Heart disease in his mother; Hypertension in his mother. There is no history of Colon cancer, Rectal cancer, or Stomach cancer.  ROS:   Please see the history of present illness.     All other systems reviewed and are negative.  EKGs/Labs/Other Studies Reviewed:    The following studies were reviewed today:   EKG:  EKG is  ordered today.  The ekg ordered today demonstrates normal sinus rhythm  Recent Labs: 04/23/2022: TSH 1.715 07/16/2022: B Natriuretic Peptide 230.1 09/23/2022: BUN 19; Creatinine, Ser 1.86; Hemoglobin 13.7; Magnesium 1.7; Platelets 143; Potassium 4.1; Sodium 140  Recent Lipid Panel    Component Value Date/Time   CHOL 187 11/20/2021 0809   TRIG 178 (H)  11/20/2021 0809   HDL 38 (L) 11/20/2021 0809   CHOLHDL 4.9 11/20/2021 0809   CHOLHDL 3.1 11/28/2016 0346   VLDL 11 11/28/2016 0346   LDLCALC 117 (H) 11/20/2021 0809     Risk Assessment/Calculations:        Physical Exam:    VS:  BP 132/80 (BP Location: Left Arm, Patient Position: Sitting, Cuff Size: Normal)   Pulse (!) 58   Ht 5' 10"$  (1.778 m)   Wt 256 lb 3.2 oz (116.2 kg)   SpO2 98%   BMI 36.76 kg/m     Wt Readings from Last 3 Encounters:  01/09/23 256 lb 3.2 oz (116.2 kg)  12/29/22 258 lb (117 kg)  10/27/22 262 lb 3.2 oz (118.9 kg)     GEN:  Well nourished, well developed in no acute distress HEENT: Normal NECK: No JVD; No carotid bruits CARDIAC: RRR, no murmurs RESPIRATORY:  Clear to auscultation without rales, wheezing or rhonchi  ABDOMEN: Soft, non-tender, non-distended MUSCULOSKELETAL:  No edema; No deformity  SKIN: Warm and dry NEUROLOGIC:  Alert and oriented x 3 PSYCHIATRIC:  Normal affect   ASSESSMENT:    1. Persistent atrial fibrillation ([place])   2. Primary hypertension   3. Mixed hyperlipidemia    PLAN:    In order of problems listed above:  Persistent atrial fibrillation s/p DC cardioversion 10/2021 (prior in 2018), A-fib ablation 10/23.  CHA2DS2-VASc score 1.  EF 60 to 65%.  Continue Toprol-XL, Xarelto.  Chronic steroid use, easy bruisability or bleeding.  Watchman consideration as per EP.  Appreciate input from EP. Hypertension, BP controlled.  Continue Toprol-XL. Hyperlipidemia, on Lipitor 40 mg.  Obtain fasting lipid profile.    Follow-up in 8 months.     [place] Referral for Left Atrial Appendage Closure with Non-Valvular Atrial Fibrillation   [name] [name] is a 62 y.o. male is being referred to the [facility] Team for evaluation for Left Atrial Appendage Closure with Watchman device for the management of stroke risk resulting form non-valvular atrial fibrillation.    Base upon Mr. Magrini history, he is felt  to be a poor candidate for long-term anticoagulation because of intolerance to oral anticoagulation and increased bleeding risk (e.g. thrombocytopenia, cancer, risk of tumor associated bleeding in case of systemic anticoagulation).  The patient has a HAS-BLED score of   indicating a Yearly Major Bleeding Risk of  %.  His CHADS2-VASc Score is 4 with an unadjusted Ischemic Stroke Rate (% per year) of 4.8%.    His stroke risk necessitates a strategy of stroke prevention with either long-term oral anticoagulation or left atrial appendage occlusion therapy. We have discussed their bleeding risk in the context of their comorbid medical problems, as well as the rationale for referral for evaluation of Watchman left atrial appendage occlusion therapy. While the patient is at high long-term bleeding risk, they may be appropriate for short-term anticoagulation. Based on this individual patient's stroke and bleeding risk, a shared decision has been made to refer the patient for consideration of Watchman left atrial appendage closure utilizing the [employer] of Cardiology shared decision tool.   Medication Adjustments/Labs and Tests Ordered: Current medicines are reviewed at length with the patient today.  Concerns regarding medicines are outlined above.  Orders Placed This Encounter  Procedures   EKG 12-Lead   No orders of the defined types were placed in this encounter.   Patient Instructions  Medication Instructions:  Your physician recommends that you continue on your current medications as directed. Please refer to the Current Medication list given to you today.  *If you need a refill on your cardiac medications before your next appointment, please call your pharmacy*   Lab Work:  NONE  If you have labs (blood work) drawn today and your tests are completely normal, you will receive your results only by: [place] (if you have MyChart) OR A paper copy in the mail If  you have any lab test that is abnormal or we need to change your treatment, we will call you to review the results.   Testing/Procedures:  NONE    Follow-Up: At [facility], you and your health needs are our priority.  As part of our continuing mission to provide you with exceptional heart care, we have created designated Provider Care Teams.  These Care Teams include your primary Cardiologist (physician) and Advanced Practice Providers (APPs -  Physician Assistants and Nurse Practitioners) who all work together to provide you with the care you need, when you need it.  We recommend signing up for the patient portal called "MyChart".  Sign up information is provided on this After Visit Summary.  MyChart is used to connect with patients for Virtual Visits (Telemedicine).  Patients are able to view lab/test results, encounter notes, upcoming appointments, etc.  Non-urgent messages can be sent to your provider as well.   To learn more about what you can do with MyChart, go to [email or web address].    Your next appointment:   8 month(s)  Provider:   You may see Kate Sable, MD or one of the following Advanced Practice Providers on your designated Care Team:   Murray Hodgkins, NP Christell Faith, PA-C Cadence Kathlen Mody, PA-C Gerrie Nordmann, NP    Other Instructions  List of Primary Care Providers with telephone number has been given to you. You may contact these providers office to establish care.      Signed, Kate Sable, MD  01/09/2023 9:37 AM     [facility]

## 2023-01-09 NOTE — Patient Instructions (Addendum)
Medication Instructions:  Your physician recommends that you continue on your current medications as directed. Please refer to the Current Medication list given to you today.  *If you need a refill on your cardiac medications before your next appointment, please call your pharmacy*   Lab Work:  NONE  If you have labs (blood work) drawn today and your tests are completely normal, you will receive your results only by: [place] (if you have MyChart) OR A paper copy in the mail If you have any lab test that is abnormal or we need to change your treatment, we will call you to review the results.   Testing/Procedures:  NONE    Follow-Up: At [facility], you and your health needs are our priority.  As part of our continuing mission to provide you with exceptional heart care, we have created designated Provider Care Teams.  These Care Teams include your primary Cardiologist (physician) and Advanced Practice Providers (APPs -  Physician Assistants and Nurse Practitioners) who all work together to provide you with the care you need, when you need it.  We recommend signing up for the patient portal called "MyChart".  Sign up information is provided on this After Visit Summary.  MyChart is used to connect with patients for Virtual Visits (Telemedicine).  Patients are able to view lab/test results, encounter notes, upcoming appointments, etc.  Non-urgent messages can be sent to your provider as well.   To learn more about what you can do with MyChart, go to [email or web address].    Your next appointment:   8 month(s)  Provider:   You may see Kate Sable, MD or one of the following Advanced Practice Providers on your designated Care Team:   Murray Hodgkins, NP Christell Faith, PA-C Cadence Kathlen Mody, PA-C Gerrie Nordmann, NP    Other Instructions  List of Primary Care Providers with telephone number has been given to you. You may contact these providers office to establish  care.

## 2023-01-23 ENCOUNTER — Telehealth: Payer: Self-pay | Admitting: Cardiology

## 2023-01-23 NOTE — Telephone Encounter (Signed)
Pt c/o Shortness Of Breath: STAT if SOB developed within the last 24 hours or pt is noticeably SOB on the phone  1. Are you currently SOB (can you hear that pt is SOB on the phone)? States she can hear him all the time  2. How long have you been experiencing SOB? October   3. Are you SOB when sitting or when up moving around? States she hears it all the time, more when moving around  4. Are you currently experiencing any other symptoms? no  Patient's wife states the patient had an ablation in October and ever since he has been having SOB and it has been getting worse. She says she can hear him breathing out of breathe constantly. She says last night he took the trash out and was out of breath. She says he has never had this issue before. She says he is usually out doing stuff, but he has not been able to.

## 2023-01-23 NOTE — Telephone Encounter (Signed)
Returned call to patient's spouse, [name] [name].  [name] [name] states patient has been having increasing SOB, mainly with exertion, since his ablation in October 2023. [name] reports patient denies any episodes of A-fib, denies any cough, swelling, CP.  [name] [name] states she has been married to [name] going on 30 years next week (Monday) and she is really concerned about his SOB. She states patient has also commented that this has gone on for too long now and wishes to be evaluated. [name] [name] denies any acute issues requiring urgent medical attention at this time, ED precautions discussed.  Appt made for patient to see DOD Dr. Johnsie Cancel on 01/27/23 at 11:30am to evaluate increasing SOB.  [name] if she has any questions or concerns over the weekend regarding patient's breathing she can contact our on-call provider for advisement.   [name] verbalized understanding and expressed appreciation for call.

## 2023-01-26 NOTE — H&P (View-Only) (Signed)
Electrophysiology Office Follow up Visit Note:    Date:  01/26/2023   ID:  SEANPAUL [name], DOB July 08, 1961, MRN [id number]  PCP:  Gara Kroner, DO  [place] Cardiologist:  Kate Sable, MD  [facility] Electrophysiologist:  Vickie Epley, MD    Interval History:    [name] [name] is a 62 y.o. male patient of Agbor-Etang and Quentin Ore Added on to DOD schedule for dyspnea   He had an AF ablation 09/25/2022. During the ablation, the veins were isolated.Did not have CT PV prior the appendage was cleared with TEE   He has had dyspnea since his ablation TEE done 09/25/22 with EF 60-65% Normal RV trivial MR CXR has shown elevation of right hemidiaphragm and CE. Hct was 41.2 10/31/22 Past BNP's in 2023 only mildly elevated in 230 range  He is on chronic anticoagulation with Xarelto  He is post renal transplant s/p LURKT x 2 01/13/19 for solitary kidney on home HD since June 2017 BP has been well controlled Follows with [facility] in [place] Immunosuppression with prednisone Myfortic and envarsus XR The latter can be associated with dyspnea in 29% of patients Baseline Cr is 1.86   He has had a clear change in his breathing since ablation. He is non smoker Review of his xrays shows significantly elevated right hemidiaphragm but this appears chronic since 2015.  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 and this even causes a continuous murmur on exam Weight is stable  Past Medical History:  Diagnosis Date   Abnormal stress test 05/07/2018   Actinic keratoses 05/01/2020   Arthritis    Atrial fibrillation ([place]) 11/28/2016   Atrial fibrillation with RVR ([place])    Cataract    bilateral- right removed, left present   Cervical radiculopathy 03/01/2020   Cherry angioma 05/01/2020   Chest pain syndrome 01/26/2020   Chronic diastolic CHF (congestive heart failure) ([place])    Chronic kidney disease, stage V ([place]) [id number]   Complication of anesthesia    problem with bladder not  waking up after surgery-for right nephrectomy   Dermatofibroma 05/01/2020   Dry skin 05/01/2020   Dysrhythmia    Encounter for adequacy testing for dialysis ([place]) 09/18/2014   Encounter for therapeutic drug monitoring 12/05/2016   Enlarged kidney    right   ESRD on dialysis ([place]) Q000111Q   Folliculitis 0000000   GERD without esophagitis 12/05/2020   Gout    History of gout 01/30/2020   HLD (hyperlipidemia) 11/28/2016   Hypertension    Hypokalemia 11/28/2016   Hypomagnesemia 01/30/2020   Immunosuppression ([place]) 01/17/2019   Immunosuppressive management encounter following kidney transplant 01/25/2019   Lapband APS July 2015 06/20/2014   Left shoulder pain 06/14/2020   Mixed hyperlipidemia 11/13/2014   Morbid obesity BMI 42 01/04/2014   Multiple benign nevi 05/01/2020   Paroxysmal A-fib ([place]) 04/22/2022   Renal disorder    Renal failure 07/13/2015   Renal insufficiency    KIDNEY DISEASE - FOLLOWED BY DR. Lorrene Reid NOTES ON CHART   Renal insufficiency 04/22/2022   KIDNEY DISEASE - FOLLOWED BY DR. 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 12/24/2018   Serum total bilirubin elevated 01/30/2020   Skin tag 05/01/2020   Stage 3b chronic kidney disease ([place]) 01/30/2020   Status post gastric banding 06/20/2014  Status post laparoscopic cholecystectomy August 2016 07/13/2015   Status post living-donor kidney transplantation 01/17/2019   Sun-damaged skin 05/01/2020   TIA (transient ischemic attack) 07/14/2017   Vertigo     Past Surgical History:  Procedure Laterality Date   ATRIAL FIBRILLATION ABLATION N/A 09/25/2022   Procedure: ATRIAL FIBRILLATION ABLATION;  Surgeon: Vickie Epley, MD;  Location: [place] [facility];  Service: Cardiovascular;  Laterality: N/A;   AV FISTULA PLACEMENT     "IT CLOSED UP AND DOES NOT WORK"   AV FISTULA  PLACEMENT Right 10/27/2014   Procedure: ARTERIOVENOUS (AV) FISTULA CREATION;  Surgeon: Conrad [place], MD;  Location: [place];  Service: Vascular;  Laterality: Right;   AV FISTULA PLACEMENT Right 08/22/2015   Procedure: BRACHIOCEPHALIC ARTERIOVENOUS (AV) FISTULA CREATION;  Surgeon: Conrad Russell, MD;  Location: [place];  Service: Vascular;  Laterality: Right;   CATARACTS REMOVED Right    CHOLECYSTECTOMY N/A 07/13/2015   Procedure: LAPAROSCOPIC CHOLECYSTECTOMY WITH INTRAOPERATIVE CHOLANGIOGRAM;  Surgeon: Johnathan Hausen, MD;  Location: [facility];  Service: General;  Laterality: N/A;   EYE SURGERY     left cataract surgery-lens implant   HERNIA REPAIR     INSERTION OF MESH N/A 06/20/2014   Procedure: INSERTION OF MESH;  Surgeon: Pedro Earls, MD;  [facility]: [facility];  Service: General;  Laterality: N/A;   KNEE SURGERY  2001   LAPAROSCOPIC GASTRIC BANDING N/A 06/20/2014   Procedure: LAPAROSCOPIC GASTRIC BANDING and VENTRAL HERNIA REPAIR WITH MESH;  Surgeon: Pedro Earls, MD;  [facility];  Service: General;  Laterality: N/A;   TEE WITHOUT CARDIOVERSION N/A 09/25/2022   Procedure: TRANSESOPHAGEAL ECHOCARDIOGRAM (TEE);  Surgeon: Vickie Epley, MD;  Location: [place] [facility];  Service: Cardiovascular;  Laterality: N/A;   TOTAL NEPHRECTOMY Right     Current Medications: No outpatient medications have been marked as taking for the 01/27/23 encounter (Appointment) with Josue Hector, MD.     Allergies:   Patient has no known allergies.   Social History   Socioeconomic History   Marital status: Married    Spouse name: Not on file   Number of children: 12   Years of education: 2   Highest education level: Not on file  Occupational History   Occupation: Retired  Tobacco Use   Smoking status: Never    Passive exposure: Never   Smokeless tobacco: Never  Vaping Use   Vaping Use: Never used  Substance and Sexual Activity   Alcohol use: No    Alcohol/week: 0.0 standard drinks of  alcohol   Drug use: No   Sexual activity: Not on file  Other Topics Concern   Not on file  Social History Narrative   Lives at home with his wife.   Right-handed.   No caffeine use.   Social Determinants of Health   Financial Resource Strain: Not on file  Food Insecurity: Not on file  Transportation Needs: Not on file  Physical Activity: Not on file  Stress: Not on file  Social Connections: Not on file     Family History: The patient's family history includes CAD in an other family member; Cancer in his father, sister, and sister; Dementia in his mother; Heart disease in his mother; Hypertension in his mother. There is no history of Colon cancer, Rectal cancer, or Stomach cancer.  ROS:   Please see the history of present illness.    (+)positional SOB with lying down (+) palpitations All other systems reviewed and are  negative.  EKGs/Labs/Other Studies Reviewed:    The following studies were reviewed today:   EKG: EKG is personally reviewed. 12/29/22: Sinus rhythm  Recent Labs: 04/23/2022: TSH 1.715 07/16/2022: B Natriuretic Peptide 230.1 09/23/2022: BUN 19; Creatinine, Ser 1.86; Hemoglobin 13.7; Magnesium 1.7; Platelets 143; Potassium 4.1; Sodium 140  Recent Lipid Panel    Component Value Date/Time   CHOL 187 11/20/2021 0809   TRIG 178 (H) 11/20/2021 0809   HDL 38 (L) 11/20/2021 0809   CHOLHDL 4.9 11/20/2021 0809   CHOLHDL 3.1 11/28/2016 0346   VLDL 11 11/28/2016 0346   LDLCALC 117 (H) 11/20/2021 0809    Physical Exam:    VS:  There were no vitals taken for this visit.    Wt Readings from Last 3 Encounters:  01/09/23 256 lb 3.2 oz (116.2 kg)  12/29/22 258 lb (117 kg)  10/27/22 262 lb 3.2 oz (118.9 kg)     Affect appropriate Healthy:  appears stated age 50: normal Neck supple with no adenopathy JVP normal no bruits no thyromegaly Lungs clear with no wheezing Decreased BS right base  Heart:  S1/S2 continuous shunt murmur from fistula  no rub, gallop  or click PMI normal Abdomen: benighn, post renal transplant  Distal pulses intact with no bruits No edema Neuro non-focal Skin warm and dry LUE very large fistula with thrill    PLAN:    In order of problems listed above:  PAF:  post ablation 09/25/22. Maintaining NSR on xarelto with CHADVASC 4 Consideration for Watchman due to patient wishes not to be on long term anticoagulation and steroid use. Risk of PV stenosis post ablation quite low and need to avoid CTA with contrast due to renal transplant and elevated Cr   2.   Dyspnea; differential is long See above PV stenosis post ablation. Elevated right :       Hemidiaphragm, diastolic dysfunction Side effect from anti rejection medication Envarsus        And has not had CAD r/o. See below regarding further w/u Most concerning likely high output CHF from h       His huge fistula Will arrange right heart cath at [facility] with Dr Carolin Guernsey to include pressures and CO        With compression and without of fistula   3.   CRF : post transplant Cr 1.84 f/u with [facility] consider changing Envarsus    TTE Fluro chest for right diaphragm motion Non contrast chest CTA PFTls  BMET/BNP TSH Hct Right heart cath for high output CHF  F/U with Dr Quentin Ore and Garen Lah after testing

## 2023-01-26 NOTE — Progress Notes (Unsigned)
Electrophysiology Office Follow up Visit Note:    Date:  01/26/2023   ID:  [name] [name], DOB July 08, 1961, MRN [id number]  PCP:  Gara Kroner, DO  [place] Cardiologist:  Kate Sable, MD  [facility] Electrophysiologist:  Vickie Epley, MD    Interval History:    [name] [name] is a 62 y.o. male patient of Ag[facility]-Etang and Quentin Ore Added on to DOD schedule for dyspnea   He had an AF ablation 09/25/2022. During the ablation, the veins were isolated.Did not have CT PV prior the appendage was cleared with TEE   He has had dyspnea since his ablation TEE done 09/25/22 with EF 60-65% Normal RV trivial MR CXR has shown elevation of right hemidiaphragm and CE. Hct was 41.2 10/31/22 Past BNP's in 2023 only mildly elevated in 230 range  He is on chronic anticoagulation with Xarelto  He is post renal transplant s/p LURKT x 2 01/13/19 for solitary kidney on home HD since June 2017 BP has been well controlled Follows with [facility] in [place] Immunosuppression with prednisone Myfortic and envarsus XR The latter can be associated with dyspnea in 29% of patients Baseline Cr is 1.86   He has had a clear change in his breathing since ablation. He is non smoker Review of his xrays shows significantly elevated right hemidiaphragm but this appears chronic since 2015.  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 and this even causes a continuous murmur on exam Weight is stable  Past Medical History:  Diagnosis Date   Abnormal stress test 05/07/2018   Actinic keratoses 05/01/2020   Arthritis    Atrial fibrillation ([place]) 11/28/2016   Atrial fibrillation with RVR ([place])    Cataract    bilateral- right removed, left present   Cervical radiculopathy 03/01/2020   Cherry angioma 05/01/2020   Chest pain syndrome 01/26/2020   Chronic diastolic CHF (congestive heart failure) ([place])    Chronic kidney disease, stage V ([place]) [id number]   Complication of anesthesia    problem with bladder not  waking up after surgery-for right nephrectomy   Dermatofibroma 05/01/2020   Dry skin 05/01/2020   Dysrhythmia    Encounter for adequacy testing for dialysis ([place]) 09/18/2014   Encounter for therapeutic drug monitoring 12/05/2016   Enlarged kidney    right   ESRD on dialysis ([place]) Q000111Q   Folliculitis 0000000   GERD without esophagitis 12/05/2020   Gout    History of gout 01/30/2020   HLD (hyperlipidemia) 11/28/2016   Hypertension    Hypokalemia 11/28/2016   Hypomagnesemia 01/30/2020   Immunosuppression ([place]) 01/17/2019   Immunosuppressive management encounter following kidney transplant 01/25/2019   Lapband APS July 2015 06/20/2014   Left shoulder pain 06/14/2020   Mixed hyperlipidemia 11/13/2014   Morbid obesity BMI 42 01/04/2014   Multiple benign nevi 05/01/2020   Paroxysmal A-fib ([place]) 04/22/2022   Renal disorder    Renal failure 07/13/2015   Renal insufficiency    KIDNEY DISEASE - FOLLOWED BY DR. Lorrene Reid NOTES ON CHART   Renal insufficiency 04/22/2022   KIDNEY DISEASE - FOLLOWED BY DR. 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 12/24/2018   Serum total bilirubin elevated 01/30/2020   Skin tag 05/01/2020   Stage 3b chronic kidney disease ([place]) 01/30/2020   Status post gastric banding 06/20/2014  Status post laparoscopic cholecystectomy August 2016 07/13/2015   Status post living-donor kidney transplantation 01/17/2019   Sun-damaged skin 05/01/2020   TIA (transient ischemic attack) 07/14/2017   Vertigo     Past Surgical History:  Procedure Laterality Date   ATRIAL FIBRILLATION ABLATION N/A 09/25/2022   Procedure: ATRIAL FIBRILLATION ABLATION;  Surgeon: Vickie Epley, MD;  Location: [facility];  Service: Cardiovascular;  Laterality: N/A;   AV FISTULA PLACEMENT     "IT CLOSED UP AND DOES NOT WORK"   AV FISTULA  PLACEMENT Right 10/27/2014   Procedure: ARTERIOVENOUS (AV) FISTULA CREATION;  Surgeon: Conrad [place], MD;  Location: [place];  Service: Vascular;  Laterality: Right;   AV FISTULA PLACEMENT Right 08/22/2015   Procedure: BRACHIOCEPHALIC ARTERIOVENOUS (AV) FISTULA CREATION;  Surgeon: Conrad Russell, MD;  Location: [place];  Service: Vascular;  Laterality: Right;   CATARACTS REMOVED Right    CHOLECYSTECTOMY N/A 07/13/2015   Procedure: LAPAROSCOPIC CHOLECYSTECTOMY WITH INTRAOPERATIVE CHOLANGIOGRAM;  Surgeon: Johnathan Hausen, MD;  [facility];  Service: General;  Laterality: N/A;   EYE SURGERY     left cataract surgery-lens implant   HERNIA REPAIR     INSERTION OF MESH N/A 06/20/2014   Procedure: INSERTION OF MESH;  Surgeon: Pedro Earls, MD;  [facility];  Service: General;  Laterality: N/A;   KNEE SURGERY  2001   LAPAROSCOPIC GASTRIC BANDING N/A 06/20/2014   Procedure: LAPAROSCOPIC GASTRIC BANDING and VENTRAL HERNIA REPAIR WITH MESH;  Surgeon: Pedro Earls, MD;  [facility];  Service: General;  Laterality: N/A;   TEE WITHOUT CARDIOVERSION N/A 09/25/2022   Procedure: TRANSESOPHAGEAL ECHOCARDIOGRAM (TEE);  Surgeon: Vickie Epley, MD;  Location: [facility];  Service: Cardiovascular;  Laterality: N/A;   TOTAL NEPHRECTOMY Right     Current Medications: No outpatient medications have been marked as taking for the 01/27/23 encounter (Appointment) with Josue Hector, MD.     Allergies:   Patient has no known allergies.   Social History   Socioeconomic History   Marital status: Married    Spouse name: Not on file   Number of children: 12   Years of education: 2   Highest education level: Not on file  Occupational History   Occupation: Retired  Tobacco Use   Smoking status: Never    Passive exposure: Never   Smokeless tobacco: Never  Vaping Use   Vaping Use: Never used  Substance and Sexual Activity   Alcohol use: No    Alcohol/week: 0.0 standard drinks of  alcohol   Drug use: No   Sexual activity: Not on file  Other Topics Concern   Not on file  Social History Narrative   Lives at home with his wife.   Right-handed.   No caffeine use.   Social Determinants of Health   Financial Resource Strain: Not on file  Food Insecurity: Not on file  Transportation Needs: Not on file  Physical Activity: Not on file  Stress: Not on file  Social Connections: Not on file     Family History: The patient's family history includes CAD in an other family member; Cancer in his father, sister, and sister; Dementia in his mother; Heart disease in his mother; Hypertension in his mother. There is no history of Colon cancer, Rectal cancer, or Stomach cancer.  ROS:   Please see the history of present illness.    (+)positional SOB with lying down (+) palpitations All other systems reviewed and are  negative.  EKGs/Labs/Other Studies Reviewed:    The following studies were reviewed today:   EKG: EKG is personally reviewed. 12/29/22: Sinus rhythm  Recent Labs: 04/23/2022: TSH 1.715 07/16/2022: B Natriuretic Peptide 230.1 09/23/2022: BUN 19; Creatinine, Ser 1.86; Hemoglobin 13.7; Magnesium 1.7; Platelets 143; Potassium 4.1; Sodium 140  Recent Lipid Panel    Component Value Date/Time   CHOL 187 11/20/2021 0809   TRIG 178 (H) 11/20/2021 0809   HDL 38 (L) 11/20/2021 0809   CHOLHDL 4.9 11/20/2021 0809   CHOLHDL 3.1 11/28/2016 0346   VLDL 11 11/28/2016 0346   LDLCALC 117 (H) 11/20/2021 0809    Physical Exam:    VS:  There were no vitals taken for this visit.    Wt Readings from Last 3 Encounters:  01/09/23 256 lb 3.2 oz (116.2 kg)  12/29/22 258 lb (117 kg)  10/27/22 262 lb 3.2 oz (118.9 kg)     Affect appropriate Healthy:  appears stated age 50: normal Neck supple with no adenopathy JVP normal no bruits no thyromegaly Lungs clear with no wheezing Decreased BS right base  Heart:  S1/S2 continuous shunt murmur from fistula  no rub, gallop  or click PMI normal Abdomen: benighn, post renal transplant  Distal pulses intact with no bruits No edema Neuro non-focal Skin warm and dry LUE very large fistula with thrill    PLAN:    In order of problems listed above:  PAF:  post ablation 09/25/22. Maintaining NSR on xarelto with CHADVASC 4 Consideration for Watchman due to patient wishes not to be on long term anticoagulation and steroid use. Risk of PV stenosis post ablation quite low and need to avoid CTA with contrast due to renal transplant and elevated Cr   2.   Dyspnea; differential is long See above PV stenosis post ablation. Elevated right :       Hemidiaphragm, diastolic dysfunction Side effect from anti rejection medication Envarsus        And has not had CAD r/o. See below regarding further w/u Most concerning likely high output CHF from h       His huge fistula Will arrange right heart cath at [facility] with Dr Carolin Guernsey to include pressures and CO        With compression and without of fistula   3.   CRF : post transplant Cr 1.84 f/u with [facility] consider changing Envarsus    TTE Fluro chest for right diaphragm motion Non contrast chest CTA PFTls  BMET/BNP TSH Hct Right heart cath for high output CHF  F/U with Dr Quentin Ore and Garen Lah after testing

## 2023-01-27 ENCOUNTER — Ambulatory Visit: Payer: PPO | Attending: Cardiovascular Disease | Admitting: Cardiovascular Disease

## 2023-01-27 ENCOUNTER — Encounter: Payer: Self-pay | Admitting: Cardiovascular Disease

## 2023-01-27 ENCOUNTER — Other Ambulatory Visit: Payer: Self-pay | Admitting: Cardiovascular Disease

## 2023-01-27 VITALS — BP 124/72 | HR 70 | Ht 70.0 in | Wt 258.0 lb

## 2023-01-27 DIAGNOSIS — R0600 Dyspnea, unspecified: Secondary | ICD-10-CM | POA: Diagnosis not present

## 2023-01-27 DIAGNOSIS — I4819 Other persistent atrial fibrillation: Secondary | ICD-10-CM | POA: Diagnosis not present

## 2023-01-27 DIAGNOSIS — I5083 High output heart failure: Secondary | ICD-10-CM | POA: Diagnosis not present

## 2023-01-27 NOTE — Patient Instructions (Signed)
Medication Instructions:  Your physician recommends that you continue on your current medications as directed. Please refer to the Current Medication list given to you today.  *If you need a refill on your cardiac medications before your next appointment, please call your pharmacy*  Lab Work: Your physician recommends that you have lab work today- BMET, CBC, BNP, TSH  If you have labs (blood work) drawn today and your tests are completely normal, you will receive your results only by: MyChart Message (if you have MyChart) OR A paper copy in the mail If you have any lab test that is abnormal or we need to change your treatment, we will call you to review the results.   Testing/Procedures: Non- contrast chest CTA scanning, (CAT scanning), is a noninvasive, special x-ray that produces cross-sectional images of the body using x-rays and a computer. CT scans help physicians diagnose and treat medical conditions. For some CT exams, a contrast material is used to enhance visibility in the area of the body being studied. CT scans provide greater clarity and reveal more details than regular x-ray exams.  Fluoroscopy of chest for  right diaphragm motion.  Your physician has recommended that you have a pulmonary function test. Pulmonary Function Tests are a group of tests that measure how well air moves in and out of your lungs.  Your physician has requested that you have an echocardiogram. Echocardiography is a painless test that uses sound waves to create images of your heart. It provides your doctor with information about the size and shape of your heart and how well your heart's chambers and valves are working. This procedure takes approximately one hour. There are no restrictions for this procedure. Please do NOT wear cologne, perfume, aftershave, or lotions (deodorant is allowed). Please arrive 15 minutes prior to your appointment time.  Your physician has requested that you have a cardiac  catheterization, we will call you to schedule. Cardiac catheterization is used to diagnose and/or treat various heart conditions. Doctors may recommend this procedure for a number of different reasons. The most common reason is to evaluate chest pain. Chest pain can be a symptom of coronary artery disease (CAD), and cardiac catheterization can show whether plaque is narrowing or blocking your heart's arteries. This procedure is also used to evaluate the valves, as well as measure the blood flow and oxygen levels in different parts of your heart. For further information please visit [email or web address]. Please follow instruction sheet, as given.   Follow-Up: At [facility], you and your health needs are our priority.  As part of our continuing mission to provide you with exceptional heart care, we have created designated Provider Care Teams.  These Care Teams include your primary Cardiologist (physician) and Advanced Practice Providers (APPs -  Physician Assistants and Nurse Practitioners) who all work together to provide you with the care you need, when you need it.  We recommend signing up for the patient portal called "MyChart".  Sign up information is provided on this After Visit Summary.  MyChart is used to connect with patients for Virtual Visits (Telemedicine).  Patients are able to view lab/test results, encounter notes, upcoming appointments, etc.  Non-urgent messages can be sent to your provider as well.   To learn more about what you can do with MyChart, go to [email or web address].    Your next appointment:   After all testing completed  Provider:   Dr. Quentin Ore

## 2023-01-28 ENCOUNTER — Telehealth: Payer: Self-pay | Admitting: Cardiovascular Disease

## 2023-01-28 ENCOUNTER — Other Ambulatory Visit: Payer: Self-pay | Admitting: Cardiovascular Disease

## 2023-01-28 DIAGNOSIS — I5030 Unspecified diastolic (congestive) heart failure: Secondary | ICD-10-CM

## 2023-01-28 LAB — BASIC METABOLIC PANEL
BUN/Creatinine Ratio: 8 — ABNORMAL LOW (ref 10–24)
BUN: 15 mg/dL (ref 8–27)
CO2: 25 mmol/L (ref 20–29)
Calcium: 9.7 mg/dL (ref 8.6–10.2)
Chloride: 101 mmol/L (ref 96–106)
Creatinine, Ser: 1.82 mg/dL — ABNORMAL HIGH (ref 0.76–1.27)
Glucose: 141 mg/dL — ABNORMAL HIGH (ref 70–99)
Potassium: 4.2 mmol/L (ref 3.5–5.2)
Sodium: 141 mmol/L (ref 134–144)
eGFR: 41 mL/min/{1.73_m2} — ABNORMAL LOW (ref >59)

## 2023-01-28 LAB — CBC WITH DIFFERENTIAL/PLATELET
Basophils Absolute: 0 10*3/uL (ref 0.0–0.2)
Basos: 1 %
EOS (ABSOLUTE): 0.1 10*3/uL (ref 0.0–0.4)
Eos: 1 %
Hematocrit: 41.2 % (ref 37.5–51.0)
Hemoglobin: 13.8 g/dL (ref 13.0–17.7)
Immature Grans (Abs): 0 10*3/uL (ref 0.0–0.1)
Immature Granulocytes: 1 %
Lymphocytes Absolute: 0.5 10*3/uL — ABNORMAL LOW (ref 0.7–3.1)
Lymphs: 7 %
MCH: 30.9 pg (ref 26.6–33.0)
MCHC: 33.5 g/dL (ref 31.5–35.7)
MCV: 92 fL (ref 79–97)
Monocytes Absolute: 0.5 10*3/uL (ref 0.1–0.9)
Monocytes: 9 %
Neutrophils Absolute: 5 10*3/uL (ref 1.4–7.0)
Neutrophils: 81 %
Platelets: 156 10*3/uL (ref 150–450)
RBC: 4.47 x10E6/uL (ref 4.14–5.80)
RDW: 13 % (ref 11.6–15.4)
WBC: 6.1 10*3/uL (ref 3.4–10.8)

## 2023-01-28 LAB — PRO B NATRIURETIC PEPTIDE: NT-Pro BNP: 454 pg/mL — ABNORMAL HIGH (ref 0–210)

## 2023-01-28 LAB — TSH: TSH: 1.17 u[IU]/mL (ref 0.450–4.500)

## 2023-01-28 MED ORDER — SODIUM CHLORIDE 0.9% FLUSH: 3.0000 mL | Freq: Two times a day (BID) | INTRAVENOUS | Status: AC

## 2023-01-28 NOTE — Telephone Encounter (Signed)
Called patient back about message. Patient is suppose to follow up with his nephrologist to see if he is able to tolerate a low dose lasix. Sent copy of lab work to patient's nephrologist.

## 2023-01-28 NOTE — Telephone Encounter (Signed)
Pt c/o medication issue:  1. Name of Medication:   furosemide (LASIX)      2. How are you currently taking this medication (dosage and times per day)?   Not started yet  3. Are you having a reaction (difficulty breathing--STAT)?   4. What is your medication issue?   Patient stated he has been prescribed Furosemide tablets and he wants to know why this is being prescribed as he had a kidney transplant in 2020.

## 2023-01-29 ENCOUNTER — Ambulatory Visit (INDEPENDENT_AMBULATORY_CARE_PROVIDER_SITE_OTHER): Payer: PPO | Admitting: Internal Medicine

## 2023-01-29 DIAGNOSIS — I4819 Other persistent atrial fibrillation: Secondary | ICD-10-CM | POA: Diagnosis not present

## 2023-01-29 DIAGNOSIS — R0600 Dyspnea, unspecified: Secondary | ICD-10-CM

## 2023-01-29 LAB — PULMONARY FUNCTION TEST
DL/VA % pred: 112 %
DL/VA: 4.64 ml/min/mmHg/L
DLCO cor % pred: 70 %
DLCO cor: 19.5 ml/min/mmHg
DLCO unc % pred: 68 %
DLCO unc: 19.05 ml/min/mmHg
FEF 25-75 Post: 2.51 L/sec
FEF 25-75 Pre: 2.32 L/sec
FEF2575-%Change-Post: 7 %
FEF2575-%Pred-Post: 87 %
FEF2575-%Pred-Pre: 80 %
FEV1-%Change-Post: 3 %
FEV1-%Pred-Post: 59 %
FEV1-%Pred-Pre: 57 %
FEV1-Post: 2.15 L
FEV1-Pre: 2.08 L
FEV1FVC-%Change-Post: 2 %
FEV1FVC-%Pred-Pre: 111 %
FEV6-%Change-Post: 0 %
FEV6-%Pred-Post: 54 %
FEV6-%Pred-Pre: 54 %
FEV6-Post: 2.51 L
FEV6-Pre: 2.49 L
FEV6FVC-%Pred-Post: 105 %
FEV6FVC-%Pred-Pre: 105 %
FVC-%Change-Post: 0 %
FVC-%Pred-Post: 52 %
FVC-%Pred-Pre: 51 %
FVC-Post: 2.51 L
FVC-Pre: 2.49 L
Post FEV1/FVC ratio: 86 %
Post FEV6/FVC ratio: 100 %
Pre FEV1/FVC ratio: 83 %
Pre FEV6/FVC Ratio: 100 %
RV % pred: 82 %
RV: 1.96 L
TLC % pred: 71 %
TLC: 5.15 L

## 2023-01-29 NOTE — Progress Notes (Signed)
Full PFT Performed Today. 

## 2023-01-29 NOTE — Patient Instructions (Signed)
Full PFT Performed Today. 

## 2023-02-02 ENCOUNTER — Telehealth: Payer: Self-pay | Admitting: *Deleted

## 2023-02-02 NOTE — Telephone Encounter (Signed)
Right Heart Cath scheduled at [facility] for: Tuesday February 24, 2023 9 AM Arrival time and place: [facility] Main Entrance A at: 7 AM  Nothing to eat after midnight prior to procedure, clear liquids until 5 AM day of procedure.  Medication instructions: -Hold:  Xarelto-do not take starting 02/22/23 until after procedure  Lasix-AM of procedure  -Other usual morning medications can be taken with sips of water.  Confirmed patient has responsible adult to drive home post procedure and be with patient first 24 hours after arriving home.  Reviewed with patient's wife.

## 2023-02-02 NOTE — Telephone Encounter (Addendum)
Call placed to patient to review instructions for Right Heart Cath tomorrow,  I spoke with patient's wife, [name] Stabs [facility]), she states pt requesting to reschedule.  [name] Stabs reports patient had PFT done 01/29/23, has change in cough since then, just didn't feel well over week-end, no fever, he plans to follow up with them.  [name] Stabs reports Dr Johnsie Cancel had requested okay from Dr Jolinda Croak (nephrologist) to start Lasix. [name] Stabs reports Dr Jolinda Croak prescribed and patient started  Lasix 40 mg daily 01/30/23,  pt weight down 4 pounds since starting Lasix, has follow up appointment scheduled with Dr Jolinda Croak 02/18/23.  [name] Stabs reports patient did take Xarelto last night, was to hold prior to Right Heart Cath 02/03/23.   At patient's request, I have rescheduled Right Heart Cath to 02/24/23 with Dr Daniel Nones.   Patient did not want to reschedule to 02/06/23 --- 02/24/23 was next available date with Dr Daniel Nones.

## 2023-02-04 ENCOUNTER — Other Ambulatory Visit (HOSPITAL_COMMUNITY): Payer: Self-pay | Admitting: Cardiovascular Disease

## 2023-02-04 ENCOUNTER — Telehealth: Payer: Self-pay

## 2023-02-04 DIAGNOSIS — R0600 Dyspnea, unspecified: Secondary | ICD-10-CM

## 2023-02-04 NOTE — Addendum Note (Signed)
Addended by: Aris Georgia, Romanita Fager L on: 02/04/2023 11:35 AM   Modules accepted: Orders

## 2023-02-04 NOTE — Telephone Encounter (Signed)
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 You ordered wrong CT study needs to be non contrast CT he has renal failure and prior kidney transplant cannot get contrast

## 2023-02-16 ENCOUNTER — Other Ambulatory Visit: Payer: Self-pay | Admitting: Cardiovascular Disease

## 2023-02-16 ENCOUNTER — Ambulatory Visit (HOSPITAL_COMMUNITY): Payer: PPO

## 2023-02-16 ENCOUNTER — Ambulatory Visit
Admission: RE | Admit: 2023-02-16 | Discharge: 2023-02-16 | Disposition: A | Discharge status: Home / Self Care | Payer: PPO | Source: Ambulatory Visit | Attending: Cardiovascular Disease | Admitting: Cardiovascular Disease

## 2023-02-16 ENCOUNTER — Ambulatory Visit (HOSPITAL_COMMUNITY)
Admission: RE | Admit: 2023-02-16 | Discharge: 2023-02-16 | Disposition: A | Discharge status: Home / Self Care | Payer: PPO | Source: Ambulatory Visit | Attending: Cardiovascular Disease | Admitting: Cardiovascular Disease

## 2023-02-16 DIAGNOSIS — R0600 Dyspnea, unspecified: Secondary | ICD-10-CM

## 2023-02-16 DIAGNOSIS — I5083 High output heart failure: Secondary | ICD-10-CM | POA: Diagnosis present

## 2023-02-16 DIAGNOSIS — I4819 Other persistent atrial fibrillation: Secondary | ICD-10-CM

## 2023-02-16 DIAGNOSIS — J986 Disorders of diaphragm: Secondary | ICD-10-CM

## 2023-02-16 HISTORY — DX: Disorders of diaphragm: J98.6

## 2023-02-16 NOTE — Telephone Encounter (Signed)
-----   Message from Josue Hector, MD sent at 02/16/2023 11:08 AM EDT ----- As expected right diaphragm not moving normally contributes to dyspnea should have pulmon ary f/u

## 2023-02-16 NOTE — Telephone Encounter (Signed)
Patient has been referred to pulmonary.  Patient wants to know if he still needs right heart cath. Will forward to Dr. Johnsie Cancel for advisement.

## 2023-02-16 NOTE — Telephone Encounter (Signed)
Per Dr. Johnsie Cancel, yes his dyspnea is likely multifactorial   Called patient's wife back with Dr. Kyla Balzarine response. Patient will get his right heart cath later this month.

## 2023-02-23 ENCOUNTER — Other Ambulatory Visit: Payer: Self-pay | Admitting: *Deleted

## 2023-02-23 ENCOUNTER — Telehealth: Payer: Self-pay | Admitting: *Deleted

## 2023-02-23 MED ORDER — ATORVASTATIN CALCIUM 40 MG PO TABS: 40.0000 mg | ORAL_TABLET | Freq: Every day | ORAL | 1 refills | Status: DC

## 2023-02-23 NOTE — Telephone Encounter (Signed)
Right Heart Cath scheduled at [facility] for: Tuesday February 24, 2023 9 AM Arrival time [facility] at: 7 AM  Nothing to eat after midnight prior to procedure, clear liquids until 5 AM day of procedure.  Medication instructions: -Xarelto-pt reports he took last dose Friday March 22-knows to hold until post procedure  Lasix-AM of procedure -Other usual morning medications can be taken with sips of water  Confirmed patient has responsible adult to drive home post procedure and be with patient first 24 hours after arriving home.  Plan to go home the same day, you will only stay overnight if medically necessary.  Reviewed procedure instructions with patient.

## 2023-02-24 ENCOUNTER — Other Ambulatory Visit: Payer: Self-pay

## 2023-02-24 ENCOUNTER — Telehealth: Payer: Self-pay

## 2023-02-24 ENCOUNTER — Ambulatory Visit (HOSPITAL_COMMUNITY)
Admission: RE | Admit: 2023-02-24 | Discharge: 2023-02-24 | Disposition: A | Discharge status: Home / Self Care | Payer: PPO | Attending: Cardiology | Admitting: Cardiology

## 2023-02-24 ENCOUNTER — Encounter (HOSPITAL_COMMUNITY)
Admission: RE | Disposition: A | Discharge status: Home / Self Care | Payer: Self-pay | Source: Home / Self Care | Attending: Cardiology

## 2023-02-24 ENCOUNTER — Encounter (HOSPITAL_COMMUNITY): Payer: Self-pay | Admitting: Cardiology

## 2023-02-24 DIAGNOSIS — I480 Paroxysmal atrial fibrillation: Secondary | ICD-10-CM | POA: Insufficient documentation

## 2023-02-24 DIAGNOSIS — N186 End stage renal disease: Secondary | ICD-10-CM | POA: Insufficient documentation

## 2023-02-24 DIAGNOSIS — I5032 Chronic diastolic (congestive) heart failure: Secondary | ICD-10-CM | POA: Diagnosis not present

## 2023-02-24 DIAGNOSIS — Z992 Dependence on renal dialysis: Secondary | ICD-10-CM | POA: Diagnosis not present

## 2023-02-24 DIAGNOSIS — I509 Heart failure, unspecified: Secondary | ICD-10-CM

## 2023-02-24 DIAGNOSIS — I5030 Unspecified diastolic (congestive) heart failure: Secondary | ICD-10-CM

## 2023-02-24 DIAGNOSIS — Z7901 Long term (current) use of anticoagulants: Secondary | ICD-10-CM | POA: Insufficient documentation

## 2023-02-24 DIAGNOSIS — Z940 Kidney transplant status: Secondary | ICD-10-CM | POA: Insufficient documentation

## 2023-02-24 DIAGNOSIS — I132 Hypertensive heart and chronic kidney disease with heart failure and with stage 5 chronic kidney disease, or end stage renal disease: Secondary | ICD-10-CM | POA: Insufficient documentation

## 2023-02-24 DIAGNOSIS — R0600 Dyspnea, unspecified: Secondary | ICD-10-CM | POA: Diagnosis not present

## 2023-02-24 DIAGNOSIS — I5083 High output heart failure: Secondary | ICD-10-CM

## 2023-02-24 HISTORY — PX: RIGHT HEART CATH: CATH118263

## 2023-02-24 LAB — POCT I-STAT EG7
Acid-Base Excess: 0 mmol/L (ref 0.0–2.0)
Acid-Base Excess: 0 mmol/L (ref 0.0–2.0)
Acid-Base Excess: 1 mmol/L (ref 0.0–2.0)
Acid-Base Excess: 1 mmol/L (ref 0.0–2.0)
Bicarbonate: 26.4 mmol/L (ref 20.0–28.0)
Bicarbonate: 27.2 mmol/L (ref 20.0–28.0)
Bicarbonate: 27.7 mmol/L (ref 20.0–28.0)
Bicarbonate: 27.7 mmol/L (ref 20.0–28.0)
Calcium, Ion: 1.3 mmol/L (ref 1.15–1.40)
Calcium, Ion: 1.32 mmol/L (ref 1.15–1.40)
Calcium, Ion: 1.33 mmol/L (ref 1.15–1.40)
Calcium, Ion: 1.33 mmol/L (ref 1.15–1.40)
HCT: 39 % (ref 39.0–52.0)
HCT: 40 % (ref 39.0–52.0)
HCT: 40 % (ref 39.0–52.0)
HCT: 40 % (ref 39.0–52.0)
Hemoglobin: 13.3 g/dL (ref 13.0–17.0)
Hemoglobin: 13.6 g/dL (ref 13.0–17.0)
Hemoglobin: 13.6 g/dL (ref 13.0–17.0)
Hemoglobin: 13.6 g/dL (ref 13.0–17.0)
O2 Saturation: 70 %
O2 Saturation: 75 %
O2 Saturation: 76 %
O2 Saturation: 77 %
Potassium: 4.4 mmol/L (ref 3.5–5.1)
Potassium: 4.5 mmol/L (ref 3.5–5.1)
Potassium: 4.5 mmol/L (ref 3.5–5.1)
Potassium: 4.5 mmol/L (ref 3.5–5.1)
Sodium: 140 mmol/L (ref 135–145)
Sodium: 140 mmol/L (ref 135–145)
Sodium: 140 mmol/L (ref 135–145)
Sodium: 141 mmol/L (ref 135–145)
TCO2: 28 mmol/L (ref 22–32)
TCO2: 29 mmol/L (ref 22–32)
TCO2: 29 mmol/L (ref 22–32)
TCO2: 29 mmol/L (ref 22–32)
pCO2, Ven: 50.4 mmHg (ref 44–60)
pCO2, Ven: 51.7 mmHg (ref 44–60)
pCO2, Ven: 51.7 mmHg (ref 44–60)
pCO2, Ven: 53.2 mmHg (ref 44–60)
pH, Ven: 7.324 (ref 7.25–7.43)
pH, Ven: 7.328 (ref 7.25–7.43)
pH, Ven: 7.329 (ref 7.25–7.43)
pH, Ven: 7.337 (ref 7.25–7.43)
pO2, Ven: 40 mmHg (ref 32–45)
pO2, Ven: 43 mmHg (ref 32–45)
pO2, Ven: 44 mmHg (ref 32–45)
pO2, Ven: 45 mmHg (ref 32–45)

## 2023-02-24 SURGERY — RIGHT HEART CATH
Anesthesia: LOCAL

## 2023-02-24 MED ORDER — HEPARIN SODIUM (PORCINE) 1000 UNIT/ML IJ SOLN
INTRAMUSCULAR | Status: AC
  Filled 2023-02-24: qty 10

## 2023-02-24 MED ORDER — FENTANYL CITRATE (PF) 100 MCG/2ML IJ SOLN
INTRAMUSCULAR | Status: DC | PRN
  Administered 2023-02-24: 25 ug via INTRAVENOUS

## 2023-02-24 MED ORDER — MIDAZOLAM HCL 2 MG/2ML IJ SOLN
INTRAMUSCULAR | Status: DC | PRN
  Administered 2023-02-24: 1 mg via INTRAVENOUS

## 2023-02-24 MED ORDER — SODIUM CHLORIDE 0.9 % IV SOLN: INTRAVENOUS | Status: DC

## 2023-02-24 MED ORDER — ASPIRIN 81 MG PO CHEW
81.0000 mg | CHEWABLE_TABLET | ORAL | Status: AC
  Administered 2023-02-24: 81 mg via ORAL
  Filled 2023-02-24: qty 1

## 2023-02-24 MED ORDER — LIDOCAINE HCL (PF) 1 % IJ SOLN
INTRAMUSCULAR | Status: AC
  Filled 2023-02-24: qty 30

## 2023-02-24 MED ORDER — LIDOCAINE HCL (PF) 1 % IJ SOLN
INTRAMUSCULAR | Status: DC | PRN
  Administered 2023-02-24: 5 mL

## 2023-02-24 MED ORDER — SODIUM CHLORIDE 0.9% FLUSH: 3.0000 mL | INTRAVENOUS | Status: DC | PRN

## 2023-02-24 MED ORDER — HEPARIN SODIUM (PORCINE) 1000 UNIT/ML IJ SOLN
INTRAMUSCULAR | Status: DC | PRN
  Administered 2023-02-24: 3000 [IU] via INTRAVENOUS

## 2023-02-24 MED ORDER — SODIUM CHLORIDE 0.9 % IV SOLN: 250.0000 mL | INTRAVENOUS | Status: DC | PRN

## 2023-02-24 MED ORDER — FENTANYL CITRATE (PF) 100 MCG/2ML IJ SOLN
INTRAMUSCULAR | Status: AC
  Filled 2023-02-24: qty 2

## 2023-02-24 MED ORDER — MIDAZOLAM HCL 2 MG/2ML IJ SOLN
INTRAMUSCULAR | Status: AC
  Filled 2023-02-24: qty 2

## 2023-02-24 SURGICAL SUPPLY — 9 items
CATH SWAN GANZ 7F STRAIGHT (CATHETERS) IMPLANT
KIT MICROPUNCTURE NIT STIFF (SHEATH) IMPLANT
PACK CARDIAC CATHETERIZATION (CUSTOM PROCEDURE TRAY) ×1 IMPLANT
PROTECTION STATION PRESSURIZED (MISCELLANEOUS) ×1
SHEATH PINNACLE 7F 10CM (SHEATH) IMPLANT
SHEATH PROBE COVER 6X72 (BAG) IMPLANT
STATION PROTECTION PRESSURIZED (MISCELLANEOUS) IMPLANT
TRANSDUCER W/STOPCOCK (MISCELLANEOUS) ×1 IMPLANT
WIRE EMERALD 3MM-J .025X260CM (WIRE) IMPLANT

## 2023-02-24 NOTE — Discharge Instructions (Signed)

## 2023-02-24 NOTE — Telephone Encounter (Signed)
Called patient with Dr. Kyla Balzarine advisement. Placed referral to [facility] and told patient to follow-up with his Nephrologist Dr. Marlou Porch.

## 2023-02-24 NOTE — Interval H&P Note (Signed)
History and Physical Interval Note:  02/24/2023 9:15 AM  [name] [name]  has presented today for surgery, with the diagnosis of heart failure.  The various methods of treatment have been discussed with the patient and family. After consideration of risks, benefits and other options for treatment, the patient has consented to  Procedure(s): RIGHT HEART CATH (N/A) as a surgical intervention.  The patient's history has been reviewed, patient examined, no change in status, stable for surgery.  I have reviewed the patient's chart and labs.  Questions were answered to the patient's satisfaction.     Kjersti Dittmer

## 2023-02-24 NOTE — Telephone Encounter (Signed)
-----   Message from Josue Hector, MD sent at 02/24/2023 10:45 AM EDT ----- Needs to f/u with nephrology to arrange banding of fistula due to high output CHF Didn't see vascular surgeon in Epic If he has none refer him to [facility] ----- Message ----- From: Hebert Soho, DO Sent: 02/24/2023  10:17 AM EDT To: Josue Hector, MD

## 2023-02-26 ENCOUNTER — Ambulatory Visit (HOSPITAL_COMMUNITY): Payer: PPO | Attending: Cardiovascular Disease

## 2023-02-26 DIAGNOSIS — I4819 Other persistent atrial fibrillation: Secondary | ICD-10-CM | POA: Insufficient documentation

## 2023-02-26 DIAGNOSIS — R0600 Dyspnea, unspecified: Secondary | ICD-10-CM | POA: Insufficient documentation

## 2023-02-26 LAB — ECHOCARDIOGRAM COMPLETE
AR max vel: 2.57 cm2
AV Area VTI: 2.71 cm2
AV Area mean vel: 2.6 cm2
AV Mean grad: 8.7 mmHg
AV Peak grad: 16.4 mmHg
Ao pk vel: 2.02 m/s
Area-P 1/2: 3.65 cm2
Calc EF: 57.2 %
MV M vel: 4.93 m/s
MV Peak grad: 97 mmHg
Radius: 0.4 cm
S' Lateral: 3.8 cm
Single Plane A2C EF: 59.2 %
Single Plane A4C EF: 59.6 %

## 2023-02-27 ENCOUNTER — Telehealth: Payer: Self-pay

## 2023-02-27 NOTE — Telephone Encounter (Signed)
-----   Message from Josue Hector, MD sent at 02/26/2023  1:38 PM EDT ----- Known aneurysm CTA pending EF normal just mild MR overall good

## 2023-02-27 NOTE — Telephone Encounter (Signed)
Spoke to patient's spouse [name] [name] regarding Echo results. Spouse verbalizes understanding that EF normal, mild MR present, "overall good" per Dr. Johnsie Cancel.

## 2023-03-04 ENCOUNTER — Other Ambulatory Visit: Payer: Self-pay

## 2023-03-04 MED ORDER — METOPROLOL SUCCINATE ER 25 MG PO TB24: 25.0000 mg | ORAL_TABLET | Freq: Two times a day (BID) | ORAL | 3 refills | Status: DC

## 2023-03-10 ENCOUNTER — Ambulatory Visit: Payer: PPO | Admitting: Nurse Practitioner

## 2023-03-11 ENCOUNTER — Encounter: Payer: Self-pay | Admitting: Vascular Surgery

## 2023-03-11 ENCOUNTER — Ambulatory Visit: Payer: PPO | Admitting: Vascular Surgery

## 2023-03-11 VITALS — BP 121/77 | HR 60 | Temp 97.9°F | Resp 20 | Ht 70.0 in | Wt 257.8 lb

## 2023-03-11 DIAGNOSIS — I5083 High output heart failure: Secondary | ICD-10-CM | POA: Diagnosis not present

## 2023-03-11 NOTE — H&P (View-Only) (Signed)
 Patient ID: [name] [name], male   DOB: 11/21/1961, 62 y.o.   MRN: [id number]  Reason for Consult: New Patient (Initial Visit)   Referred by Nishan, Peter C, MD  Subjective:     HPI:  [name] [name] is a 62 y.o. male history of renal transplant from a live donor 4 years ago.  Previously was on dialysis via right upper arm cephalic vein fistula after failed left cephalic and basilic vein fistulas and right radiocephalic fistula.  Patient now with high output heart failure followed by cardiology sent for evaluation of his AV fistula.  He denies any hand pain.  He has not had issues with the fistula recently.  Past Medical History:  Diagnosis Date   Abnormal stress test 05/07/2018   Actinic keratoses 05/01/2020   Arthritis    Atrial fibrillation 11/28/2016   Atrial fibrillation with RVR    Cataract    bilateral- right removed, left present   Cervical radiculopathy 03/01/2020   Cherry angioma 05/01/2020   Chest pain syndrome 01/26/2020   Chronic diastolic CHF (congestive heart failure)    Chronic kidney disease, stage V 09/18/2014   Complication of anesthesia    problem with bladder not waking up after surgery-for right nephrectomy   Dermatofibroma 05/01/2020   Dry skin 05/01/2020   Dysrhythmia    Encounter for adequacy testing for dialysis 09/18/2014   Encounter for therapeutic drug monitoring 12/05/2016   Enlarged kidney    right   ESRD on dialysis 11/28/2016   Folliculitis 05/01/2020   GERD without esophagitis 12/05/2020   Gout    History of gout 01/30/2020   HLD (hyperlipidemia) 11/28/2016   Hypertension    Hypokalemia 11/28/2016   Hypomagnesemia 01/30/2020   Immunosuppression 01/17/2019   Immunosuppressive management encounter following kidney transplant 01/25/2019   Lapband APS July 2015 06/20/2014   Left shoulder pain 06/14/2020   Mixed hyperlipidemia 11/13/2014   Morbid obesity BMI 42 01/04/2014   Multiple benign nevi 05/01/2020   Paroxysmal A-fib 04/22/2022   Renal disorder     Renal failure 07/13/2015   Renal insufficiency    KIDNEY DISEASE - FOLLOWED BY DR. DUNHAM NOTES ON CHART   Renal insufficiency 04/22/2022   KIDNEY DISEASE - FOLLOWED BY DR. DUNHAM NOTES ON CHART   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   S/P repair of ventral hernia 06/21/2014   Sebaceous hyperplasia 05/01/2020   Seborrheic keratoses 05/01/2020   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 12/24/2018   Serum total bilirubin elevated 01/30/2020   Skin tag 05/01/2020   Stage 3b chronic kidney disease 01/30/2020   Status post gastric banding 06/20/2014   Status post laparoscopic cholecystectomy August 2016 07/13/2015   Status post living-donor kidney transplantation 01/17/2019   Sun-damaged skin 05/01/2020   TIA (transient ischemic attack) 07/14/2017   Vertigo    Family History  Problem Relation Age of Onset   Hypertension Mother    Heart disease Mother        before age 60   Dementia Mother    Cancer Father        bone   Cancer Sister        breast   Cancer Sister        breast   CAD Other    Colon cancer Neg Hx    Rectal cancer Neg Hx    Stomach cancer Neg Hx    Past Surgical History:  Procedure Laterality Date   ATRIAL FIBRILLATION ABLATION N/A 09/25/2022   Procedure: ATRIAL FIBRILLATION ABLATION;    Surgeon: Lambert, Cameron T, MD;  [facility]: [facility];  Service: Cardiovascular;  Laterality: N/A;   AV FISTULA PLACEMENT     "IT CLOSED UP AND DOES NOT WORK"   AV FISTULA PLACEMENT Right 10/27/2014   Procedure: ARTERIOVENOUS (AV) FISTULA CREATION;  Surgeon: Brian L Chen, MD;  Location: [facility];  Service: Vascular;  Laterality: Right;   AV FISTULA PLACEMENT Right 08/22/2015   Procedure: BRACHIOCEPHALIC ARTERIOVENOUS (AV) FISTULA CREATION;  Surgeon: Brian L Chen, MD;  Location: [facility];  Service: Vascular;  Laterality: Right;   CATARACTS REMOVED Right    CHOLECYSTECTOMY N/A 07/13/2015   Procedure: LAPAROSCOPIC CHOLECYSTECTOMY WITH INTRAOPERATIVE CHOLANGIOGRAM;  Surgeon: Matthew Martin, MD;   Location: [facility];  Service: General;  Laterality: N/A;   EYE SURGERY     left cataract surgery-lens implant   HERNIA REPAIR     INSERTION OF MESH N/A 06/20/2014   Procedure: INSERTION OF MESH;  Surgeon: Matthew B Martin, MD;  Location: [facility];  Service: General;  Laterality: N/A;   KNEE SURGERY  2001   LAPAROSCOPIC GASTRIC BANDING N/A 06/20/2014   Procedure: LAPAROSCOPIC GASTRIC BANDING and VENTRAL HERNIA REPAIR WITH MESH;  Surgeon: Matthew B Martin, MD;  Location: [facility];  Service: General;  Laterality: N/A;   RIGHT HEART CATH N/A 02/24/2023   Procedure: RIGHT HEART CATH;  Surgeon: Sabharwal, Aditya, DO;  Location: [facility];  Service: Cardiovascular;  Laterality: N/A;   TEE WITHOUT CARDIOVERSION N/A 09/25/2022   Procedure: TRANSESOPHAGEAL ECHOCARDIOGRAM (TEE);  Surgeon: Lambert, Cameron T, MD;  Location: [facility];  Service: Cardiovascular;  Laterality: N/A;   TOTAL NEPHRECTOMY Right     Short Social History:  Social History   Tobacco Use   Smoking status: Never    Passive exposure: Never   Smokeless tobacco: Never  Substance Use Topics   Alcohol use: No    Alcohol/week: 0.0 standard drinks of alcohol    No Known Allergies  Current Outpatient Medications  Medication Sig Dispense Refill   atorvastatin (LIPITOR) 40 MG tablet Take 1 tablet (40 mg total) by mouth daily. 90 tablet 1   diphenhydramine-acetaminophen (TYLENOL PM) 25-500 MG TABS tablet Take 2 tablets by mouth at bedtime.     ENVARSUS XR 1 MG TB24 Take 6 mg by mouth in the morning.     furosemide (LASIX) 40 MG tablet Take 1 tablet (40 mg total) by mouth daily. Prescribed 01/30/23 Dr Pirkel (nephrology)     magnesium oxide (MAG-OX) 400 MG tablet Take 400 mg by mouth in the morning and at bedtime.     Menthol (BIOFREEZE) 10.5 % AERO Apply 1 spray topically daily as needed (shoulder pain.).     metoprolol succinate (TOPROL-XL) 25 MG 24 hr tablet Take 1 tablet (25 mg total) by mouth 2 (two) times daily. 60  tablet 3   mycophenolate (MYFORTIC) 180 MG EC tablet Take 540 mg by mouth 2 (two) times daily.     omeprazole (PRILOSEC) 20 MG capsule Take 20 mg by mouth daily as needed (acid reflux).     pantoprazole (PROTONIX) 40 MG tablet Take 1 tablet (40 mg total) by mouth daily. 45 tablet 0   predniSONE (DELTASONE) 5 MG tablet Take 5 mg by mouth in the morning.     rivaroxaban (XARELTO) 20 MG TABS tablet Take 1 tablet (20 mg total) by mouth daily with supper. 90 tablet 3   sulfamethoxazole-trimethoprim (BACTRIM) 400-80 MG tablet Take 1 tablet by mouth every Monday, Wednesday, and   Friday. In the morning     Current Facility-Administered Medications  Medication Dose Route Frequency Provider Last Rate Last Admin   sodium chloride flush (NS) 0.9 % injection 3 mL  3 mL Intravenous Q12H Nishan, Peter C, MD        Review of Systems  Constitutional:  Constitutional negative. HENT: HENT negative.  Eyes: Eyes negative.  Respiratory: Respiratory negative.  Cardiovascular: Cardiovascular negative.  GI: Gastrointestinal negative.  Musculoskeletal: Musculoskeletal negative.  Skin: Skin negative.  Neurological: Neurological negative. Hematologic: Hematologic/lymphatic negative.  Psychiatric: Psychiatric negative.        Objective:  Objective   Vitals:   03/11/23 0857  BP: 121/77  Pulse: 60  Resp: 20  Temp: 97.9 F (36.6 C)  SpO2: 98%  Weight: 257 lb 12.8 oz (116.9 kg)  Height: 5' 10" (1.778 m)   Body mass index is 36.99 kg/m.  Physical Exam HENT:     Head: Normocephalic.     Nose: Nose normal.     Mouth/Throat:     Mouth: Mucous membranes are moist.  Eyes:     Pupils: Pupils are equal, round, and reactive to light.  Cardiovascular:     Rate and Rhythm: Normal rate.  Pulmonary:     Effort: Pulmonary effort is normal.  Abdominal:     General: Abdomen is flat.  Musculoskeletal:     Cervical back: Normal range of motion.     Comments: Strong right upper arm thrill with  circumferential dilatation of upper arm AV fistula  Skin:    General: Skin is warm.     Capillary Refill: Capillary refill takes less than 2 seconds.  Neurological:     General: No focal deficit present.     Mental Status: He is alert.  Psychiatric:        Mood and Affect: Mood normal.        Thought Content: Thought content normal.        Judgment: Judgment normal.     Data: No studies     Assessment/Plan:    62-year-old male with previous living unrelated donor kidney transplant now with right arm AV fistula that is not being used as he has not been on dialysis for 4 years but causing high-output cardiac failure without any hand issues.  Fistula is quite large with very high flow by physical exam.  I discussed the options being continued monitoring but this does not appear to be a good option given the risk to his heart.  His other options include ligation versus attempted banding of the fistula which may result in functional ligation for thrombosis especially demonstrates good understanding.  Plan will be to proceed with banding down to 4 to 5 mm in the near future.  All risk benefits alternatives discussed with the patient and his wife and they demonstrate good understanding.     Kura Bethards Christopher Ileen Kahre MD [facility]   

## 2023-03-11 NOTE — Progress Notes (Signed)
Patient ID: [name] [name], male   DOB: 1961-05-28, 62 y.o.   MRN: [id number]  Reason for Consult: New Patient (Initial Visit)   Referred by Wendall Stade, MD  Subjective:     HPI:  [name] [name] is a 62 y.o. male history of renal transplant from a live donor 4 years ago.  Previously was on dialysis via right upper arm cephalic vein fistula after failed left cephalic and basilic vein fistulas and right radiocephalic fistula.  Patient now with high output heart failure followed by cardiology sent for evaluation of his AV fistula.  He denies any hand pain.  He has not had issues with the fistula recently.  Past Medical History:  Diagnosis Date   Abnormal stress test 05/07/2018   Actinic keratoses 05/01/2020   Arthritis    Atrial fibrillation 11/28/2016   Atrial fibrillation with RVR    Cataract    bilateral- right removed, left present   Cervical radiculopathy 03/01/2020   Cherry angioma 05/01/2020   Chest pain syndrome 01/26/2020   Chronic diastolic CHF (congestive heart failure)    Chronic kidney disease, stage V 09/18/2014   Complication of anesthesia    problem with bladder not waking up after surgery-for right nephrectomy   Dermatofibroma 05/01/2020   Dry skin 05/01/2020   Dysrhythmia    Encounter for adequacy testing for dialysis 09/18/2014   Encounter for therapeutic drug monitoring 12/05/2016   Enlarged kidney    right   ESRD on dialysis 11/28/2016   Folliculitis 05/01/2020   GERD without esophagitis 12/05/2020   Gout    History of gout 01/30/2020   HLD (hyperlipidemia) 11/28/2016   Hypertension    Hypokalemia 11/28/2016   Hypomagnesemia 01/30/2020   Immunosuppression 01/17/2019   Immunosuppressive management encounter following kidney transplant 01/25/2019   Lapband APS July 2015 06/20/2014   Left shoulder pain 06/14/2020   Mixed hyperlipidemia 11/13/2014   Morbid obesity BMI 42 01/04/2014   Multiple benign nevi 05/01/2020   Paroxysmal A-fib 04/22/2022   Renal disorder     Renal failure 07/13/2015   Renal insufficiency    KIDNEY DISEASE - FOLLOWED BY DR. Eliott Nine NOTES ON CHART   Renal insufficiency 04/22/2022   KIDNEY DISEASE - FOLLOWED BY DR. 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 12/24/2018   Serum total bilirubin elevated 01/30/2020   Skin tag 05/01/2020   Stage 3b chronic kidney disease 01/30/2020   Status post gastric banding 06/20/2014   Status post laparoscopic cholecystectomy August 2016 07/13/2015   Status post living-donor kidney transplantation 01/17/2019   Sun-damaged skin 05/01/2020   TIA (transient ischemic attack) 07/14/2017   Vertigo    Family History  Problem Relation Age of Onset   Hypertension Mother    Heart disease Mother        before age 10   Dementia Mother    Cancer Father        bone   Cancer Sister        breast   Cancer Sister        breast   CAD Other    Colon cancer Neg Hx    Rectal cancer Neg Hx    Stomach cancer Neg Hx    Past Surgical History:  Procedure Laterality Date   ATRIAL FIBRILLATION ABLATION N/A 09/25/2022   Procedure: ATRIAL FIBRILLATION ABLATION;  Surgeon: Lanier PrudeLambert, Cameron T, MD;  [facility];  Service: Cardiovascular;  Laterality: N/A;   AV FISTULA PLACEMENT     "IT CLOSED UP AND DOES NOT WORK"   AV FISTULA PLACEMENT Right 10/27/2014   Procedure: ARTERIOVENOUS (AV) FISTULA CREATION;  Surgeon: Fransisco HertzBrian L Chen, MD;  [facility];  Service: Vascular;  Laterality: Right;   AV FISTULA PLACEMENT Right 08/22/2015   Procedure: BRACHIOCEPHALIC ARTERIOVENOUS (AV) FISTULA CREATION;  Surgeon: Fransisco HertzBrian L Chen, MD;  Location: [facility];  Service: Vascular;  Laterality: Right;   CATARACTS REMOVED Right    CHOLECYSTECTOMY N/A 07/13/2015   Procedure: LAPAROSCOPIC CHOLECYSTECTOMY WITH INTRAOPERATIVE CHOLANGIOGRAM;  Surgeon: Luretha MurphyMatthew Martin, MD;   Location: [facility];  Service: General;  Laterality: N/A;   EYE SURGERY     left cataract surgery-lens implant   HERNIA REPAIR     INSERTION OF MESH N/A 06/20/2014   Procedure: INSERTION OF MESH;  Surgeon: Valarie MerinoMatthew B Martin, MD;  Location: [facility];  Service: General;  Laterality: N/A;   KNEE SURGERY  2001   LAPAROSCOPIC GASTRIC BANDING N/A 06/20/2014   Procedure: LAPAROSCOPIC GASTRIC BANDING and VENTRAL HERNIA REPAIR WITH MESH;  Surgeon: Valarie MerinoMatthew B Martin, MD;  Location: [facility];  Service: General;  Laterality: N/A;   RIGHT HEART CATH N/A 02/24/2023   Procedure: RIGHT HEART CATH;  Surgeon: Dorthula NettlesSabharwal, Aditya, DO;  [facility];  Service: Cardiovascular;  Laterality: N/A;   TEE WITHOUT CARDIOVERSION N/A 09/25/2022   Procedure: TRANSESOPHAGEAL ECHOCARDIOGRAM (TEE);  Surgeon: Lanier PrudeLambert, Cameron T, MD;  [facility];  Service: Cardiovascular;  Laterality: N/A;   TOTAL NEPHRECTOMY Right     Short Social History:  Social History   Tobacco Use   Smoking status: Never    Passive exposure: Never   Smokeless tobacco: Never  Substance Use Topics   Alcohol use: No    Alcohol/week: 0.0 standard drinks of alcohol    No Known Allergies  Current Outpatient Medications  Medication Sig Dispense Refill   atorvastatin (LIPITOR) 40 MG tablet Take 1 tablet (40 mg total) by mouth daily. 90 tablet 1   diphenhydramine-acetaminophen (TYLENOL PM) 25-500 MG TABS tablet Take 2 tablets by mouth at bedtime.     ENVARSUS XR 1 MG TB24 Take 6 mg by mouth in the morning.     furosemide (LASIX) 40 MG tablet Take 1 tablet (40 mg total) by mouth daily. Prescribed 01/30/23 Dr Margretta SidlePirkel (nephrology)     magnesium oxide (MAG-OX) 400 MG tablet Take 400 mg by mouth in the morning and at bedtime.     Menthol (BIOFREEZE) 10.5 % AERO Apply 1 spray topically daily as needed (shoulder pain.).     metoprolol succinate (TOPROL-XL) 25 MG 24 hr tablet Take 1 tablet (25 mg total) by mouth 2 (two) times daily. 60  tablet 3   mycophenolate (MYFORTIC) 180 MG EC tablet Take 540 mg by mouth 2 (two) times daily.     omeprazole (PRILOSEC) 20 MG capsule Take 20 mg by mouth daily as needed (acid reflux).     pantoprazole (PROTONIX) 40 MG tablet Take 1 tablet (40 mg total) by mouth daily. 45 tablet 0   predniSONE (DELTASONE) 5 MG tablet Take 5 mg by mouth in the morning.     rivaroxaban (XARELTO) 20 MG TABS tablet Take 1 tablet (20 mg total) by mouth daily with supper. 90 tablet 3   sulfamethoxazole-trimethoprim (BACTRIM) 400-80 MG tablet Take 1 tablet by mouth every Monday, Wednesday, and  Friday. In the morning     Current Facility-Administered Medications  Medication Dose Route Frequency Provider Last Rate Last Admin   sodium chloride flush (NS) 0.9 % injection 3 mL  3 mL Intravenous Q12H Wendall Stade, MD        Review of Systems  Constitutional:  Constitutional negative. HENT: HENT negative.  Eyes: Eyes negative.  Respiratory: Respiratory negative.  Cardiovascular: Cardiovascular negative.  GI: Gastrointestinal negative.  Musculoskeletal: Musculoskeletal negative.  Skin: Skin negative.  Neurological: Neurological negative. Hematologic: Hematologic/lymphatic negative.  Psychiatric: Psychiatric negative.        Objective:  Objective   Vitals:   03/11/23 0857  BP: 121/77  Pulse: 60  Resp: 20  Temp: 97.9 F (36.6 C)  SpO2: 98%  Weight: 257 lb 12.8 oz (116.9 kg)  Height: 5\' 10"  (1.778 m)   Body mass index is 36.99 kg/m.  Physical Exam HENT:     Head: Normocephalic.     Nose: Nose normal.     Mouth/Throat:     Mouth: Mucous membranes are moist.  Eyes:     Pupils: Pupils are equal, round, and reactive to light.  Cardiovascular:     Rate and Rhythm: Normal rate.  Pulmonary:     Effort: Pulmonary effort is normal.  Abdominal:     General: Abdomen is flat.  Musculoskeletal:     Cervical back: Normal range of motion.     Comments: Strong right upper arm thrill with  circumferential dilatation of upper arm AV fistula  Skin:    General: Skin is warm.     Capillary Refill: Capillary refill takes less than 2 seconds.  Neurological:     General: No focal deficit present.     Mental Status: He is alert.  Psychiatric:        Mood and Affect: Mood normal.        Thought Content: Thought content normal.        Judgment: Judgment normal.     Data: No studies     Assessment/Plan:    62 year old male with previous living unrelated donor kidney transplant now with right arm AV fistula that is not being used as he has not been on dialysis for 4 years but causing high-output cardiac failure without any hand issues.  Fistula is quite large with very high flow by physical exam.  I discussed the options being continued monitoring but this does not appear to be a good option given the risk to his heart.  His other options include ligation versus attempted banding of the fistula which may result in functional ligation for thrombosis especially demonstrates good understanding.  Plan will be to proceed with banding down to 4 to 5 mm in the near future.  All risk benefits alternatives discussed with the patient and his wife and they demonstrate good understanding.     Maeola Harman MD [facility]

## 2023-03-13 ENCOUNTER — Other Ambulatory Visit: Payer: Self-pay

## 2023-03-13 DIAGNOSIS — I5083 High output heart failure: Secondary | ICD-10-CM

## 2023-03-19 ENCOUNTER — Telehealth: Payer: Self-pay

## 2023-03-19 NOTE — Telephone Encounter (Signed)
Called pt left detailed message to resch appt 03/27/23 @ 8:30 for another time and day. If pt calls back please resch with any other provider not necessarily Dr Tonia Brooms.

## 2023-03-19 NOTE — Telephone Encounter (Signed)
Pt's spouse called back and I rescheduled pt's appt with a different provider. Nothing further needed.

## 2023-03-20 ENCOUNTER — Telehealth: Payer: Self-pay

## 2023-03-20 NOTE — Telephone Encounter (Signed)
Contacted patient to update on arrival time of 0700 AM for surgery on 4/30 at [facility].

## 2023-03-27 ENCOUNTER — Institutional Professional Consult (permissible substitution): Payer: PPO | Admitting: Pulmonary Disease

## 2023-03-29 NOTE — Progress Notes (Signed)
SUBJECTIVE:   Chief Complaint  Patient presents with   Establish Care   HPI Patient presents to clinic to establish care.  Dr. Jean Rosenthal, Dr. Earna Coder, Nephrology - [facility] Dr. Estrella Myrtle, Cardiology - [facility] Dr Lalla Brothers, EP - [facility] Dr Azucena Cecil, Cardiology - [facility] Dr Evelina Dun, Dermatology- [facility] Dr Judithann Sheen, Transplant Team - [facility] Dr. Judeth Horn, Pulmonology - [facility]  No acute concerns today.  Renovascular hypertension Asymptomatic.  Blood pressure well-controlled on current medications.  Currently takes metoprolol 25 mg twice daily.  Follows with Dr. Estrella Myrtle.  A-fib status post ablation Recent cardiac ablation in 2024.  Follows with Dr. Lalla Brothers.  Doing well on current medication.  Takes metoprolol 25 mg twice daily and Xarelto 20 mg daily.  Reports considering Watchman procedure in future.  Denies any chest pain, heart palpitations or shortness of breath.  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 Doing well.  Currently takes mycophenolate 540 mg twice daily, tacrolimus 6 mg daily and prednisone 5 mg daily, Bactrim 80 mg Mondays, Wednesdays, Fridays and Lasix 40 mg daily.  Follows with transplant team at [facility], primary nephrologist is Dr. Jean Rosenthal at [facility].  Hyperlipidemia On atorvastatin 40 mg daily, tolerating well.  Denies any myalgias or joint pain.  GERD Controlled with omeprazole 20 mg as needed.  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  Reports since having ablation has had some shortness of breath with or without exertion.  Initially thought the shortness of breath was due to right upper arm fistula and is planning for banding in the near future.  He does have an appointment with Dr. Judeth Horn, pulmonology tomorrow for further evaluation.  HFpEF Recent echo shows LVEF 60-65%.  No left ventricular wall abnormalities.  Mild left ventricular hypertrophy.  Also noted was  moderate elevation of pulmonary artery systolic pressure.  Ascending aorta aneurysm measuring 46 mm was also noted on echo.  Mild mitral/tricuspid valve regurgitation noted.  Right heart cath 03/24 did not show any blockages, recommended ligation of pending to reduce flow through the right arm AVF.  He is euvolemic today on exam.  Currently on Lasix 40 mg daily.  Reports medication ordered as needed however unsure when to take medication so has been taking daily.  Denies any increase in weight, lower extremity edema, worsening shortness of breath, chest pain or difficulty breathing.  Patient is scheduled for right arm AV fistula banding in near future.    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  HENT:     Head: Normocephalic.     Right Ear: Tympanic membrane, ear canal and external ear normal.     Left Ear: Tympanic membrane, ear canal and external ear normal.     Nose: Nose normal.     Mouth/Throat:     Mouth: Mucous membranes are moist.  Eyes:     Conjunctiva/sclera: Conjunctivae normal.  Pupils: Pupils are equal, round, and reactive to light.  Neck:     Thyroid: No thyromegaly or thyroid tenderness.     Vascular: No carotid bruit.  Cardiovascular:     Rate and Rhythm: Normal rate and regular rhythm.     Pulses: Normal pulses.     Heart sounds: Normal heart sounds.     Comments: Right upper arm fistula, +bruit, +thrill Pulmonary:     Effort:  Pulmonary effort is normal.     Breath sounds: Normal breath sounds.  Abdominal:     General: Abdomen is flat. Bowel sounds are normal.     Palpations: Abdomen is soft.  Musculoskeletal:        General: Normal range of motion.     Cervical back: Normal range of motion and neck supple.     Right lower leg: No edema.     Left lower leg: No edema.  Lymphadenopathy:     Cervical: No cervical adenopathy.  Neurological:     Mental Status: He is alert.  Psychiatric:        Mood and Affect: Mood normal.        Behavior: Behavior normal.        Thought Content: Thought content normal.        Judgment: Judgment normal.     ASSESSMENT/PLAN:  Stage 3b chronic kidney disease (HCC) Assessment & Plan: Chronic.  Recent GFR 40 Currently on Lasix 40 mg daily. My recommendations would be if increased lower extremity edema, weight increase or difficulty breathing could take dose of Lasix.  I have asked him to reach out to Dr. Margretta Sidle for clarification of use of medication.  Until then he is to continue Lasix 40 mg daily as prescribed by nephrology.    Chronic diastolic CHF (congestive heart failure) (HCC) Assessment & Plan: Chronic. Stable and euvolemic on exam. Recent echo shows LVEF 60-65%. Beta-blocker and diuretic therapy Managed by cardiology and nephrology.    Aneurysm of ascending aorta without rupture [facility]) Assessment & Plan: Chronic.  Ascending aorta aneurysm measuring 46 mm noted on echo. Blood pressure well-controlled on beta-blocker Continue to follow with cardiology    Immunosuppression [facility]) Assessment & Plan: Chronic.  Doing well. Continue current immunosuppressive medications as prescribed by transplant team Follow-up with nephro transplant team as scheduled.   Type 2 diabetes mellitus with other specified complication, without long-term current use of insulin (HCC) Assessment & Plan: New onset.  Recent A1c 6.5.  Not currently on medication. Patient aware of  prediabetes.  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 On statin therapy Not currently on ACEi/ARB, given history of renal transplant. Recheck A1c in 3 months Urine ACR Will need diabetic foot exam Annual diabetic eye exam Plan to discuss with patient at next visit.  Orders: -     Hemoglobin A1c; Future -     Microalbumin / creatinine urine ratio; Future  Paroxysmal A-fib [facility]) Assessment & Plan: Chronic.  Stable.  Rate controlled. Status post ablation 2024.  Currently on beta-blocker and Xarelto 20 mg daily. Follows with EP and cardiology.   Mixed hyperlipidemia Assessment & Plan: Chronic.  Currently on statin and tolerating well.  No myalgias. Continue atorvastatin 40 mg daily. Check fasting lipids  Orders: -     Lipid panel; Future  Obesity (BMI 30-39.9) Assessment & Plan: Chronic.  BMI 37.31.  Currently on chronic steroid therapy. Status post lap band surgery 2016. Encourage healthy diet and exercise.   History of TIA (transient ischemic  attack) Assessment & Plan: Chronic.  Stable.   No deficits and asymptomatic. Currently on atorvastatin 40 mg daily Denies tobacco use, EtOH use, THC use.   Renovascular hypertension Assessment & Plan: Chronic. Status post live donor kidney transplant in 2020. Doing well on current medication. Takes metoprolol XL 25 mg twice daily and Lasix 40 mg daily as needed Follows with nephrology at [facility] and cardiology at [facility]    GERD without esophagitis Assessment & Plan: Chronic.  Stable with as needed PPI. Continue omeprazole 20 mg daily as needed. No previous EGD. If worsening can refer to GI for further evaluation.   Renal disorder Assessment & Plan: Chronic.  Status post living donor renal transplant 2020. Doing well since transplant. Currently on immunosuppressive therapy Follows with transplant team and nephrology at [facility].   Status post living-donor kidney  transplantation Assessment & Plan: Chronic.  Stable. Status post living donor renal transplant Currently takes mycophenolate 540 mg twice daily, prednisone 5 mg daily, Bactrim 1 tablet Mondays Wednesdays and Fridays and tacrolimus 6 mg daily Managed by nephrology and transplant team at [facility].   Hypomagnesemia Assessment & Plan: Chronic. Recent magnesium 1.7.  Takes magnesium oxide 400 mg twice daily Defer management to nephrology.    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  Possibly from TEE status post ablation given patient developed symptoms of shortness of breath after procedure. In no acute distress.  Diminished lung sounds at right lower lobe.  A symmetrical chest rise on inspiration left greater than right. Pulmonology for evaluation in AM.    Prostate cancer screening -     PSA; Future    HCM Colonoscopy up-to-date.  Recommended 3 to 5-year follow-up given sessile polyps noted.  Followed by Dr. Russella Dar at [facility].  Due 07/2025-28. Hepatitis C completed 01/13/2018.  Nonreactive PSA at next visit Recommend shingles vaccine.  Ensure no live vaccine. If not received. Recommend pneumonia 20 vaccine. If not received. Annual diabetic eye and foot exam.  Will discuss at next visit Medicare annual wellness yearly Tetanus booster up-to-date.  Due 2028  PDMP reviewed  Return if symptoms worsen or fail to improve.  Dana Allan, MD

## 2023-03-29 NOTE — Patient Instructions (Incomplete)
It was a pleasure meeting you today. Thank you for allowing me to take part in your health care.  Our goals for today as we discussed include:  Schedule Medicare Annual Wellness Visit    Call Dr Jean Rosenthal to discuss when should you use the Lasix on an as needed basis. I recommend to take half tablet when increase in lower leg swelling, shortness of breath or increase in weight.  If not resolved can take the other half.  May be ok to take 20 mg daily but would discuss with Dr Jean Rosenthal.  Will review chart and let you know when to schedule follow up    If you have any questions or concerns, please do not hesitate to call the office at [phone].  I look forward to our next visit and until then take care and stay safe.  Regards,   Dana Allan, MD   [facility]

## 2023-03-30 ENCOUNTER — Other Ambulatory Visit: Payer: Self-pay

## 2023-03-30 ENCOUNTER — Encounter (HOSPITAL_COMMUNITY): Payer: Self-pay | Admitting: Vascular Surgery

## 2023-03-30 ENCOUNTER — Encounter: Payer: Self-pay | Admitting: Family Medicine

## 2023-03-30 ENCOUNTER — Ambulatory Visit (INDEPENDENT_AMBULATORY_CARE_PROVIDER_SITE_OTHER): Payer: PPO | Admitting: Family Medicine

## 2023-03-30 ENCOUNTER — Ambulatory Visit: Payer: PPO

## 2023-03-30 VITALS — BP 114/70 | HR 66 | Temp 98.0°F | Resp 16 | Ht 70.0 in | Wt 260.0 lb

## 2023-03-30 DIAGNOSIS — N1832 Chronic kidney disease, stage 3b: Secondary | ICD-10-CM

## 2023-03-30 DIAGNOSIS — I5032 Chronic diastolic (congestive) heart failure: Secondary | ICD-10-CM | POA: Diagnosis not present

## 2023-03-30 DIAGNOSIS — I10 Essential (primary) hypertension: Secondary | ICD-10-CM

## 2023-03-30 DIAGNOSIS — I7121 Aneurysm of the ascending aorta, without rupture: Secondary | ICD-10-CM | POA: Diagnosis not present

## 2023-03-30 DIAGNOSIS — Z125 Encounter for screening for malignant neoplasm of prostate: Secondary | ICD-10-CM

## 2023-03-30 DIAGNOSIS — N289 Disorder of kidney and ureter, unspecified: Secondary | ICD-10-CM

## 2023-03-30 DIAGNOSIS — Z8673 Personal history of transient ischemic attack (TIA), and cerebral infarction without residual deficits: Secondary | ICD-10-CM

## 2023-03-30 DIAGNOSIS — I150 Renovascular hypertension: Secondary | ICD-10-CM

## 2023-03-30 DIAGNOSIS — D849 Immunodeficiency, unspecified: Secondary | ICD-10-CM

## 2023-03-30 DIAGNOSIS — Z1211 Encounter for screening for malignant neoplasm of colon: Secondary | ICD-10-CM

## 2023-03-30 DIAGNOSIS — J986 Disorders of diaphragm: Secondary | ICD-10-CM

## 2023-03-30 DIAGNOSIS — Z940 Kidney transplant status: Secondary | ICD-10-CM

## 2023-03-30 DIAGNOSIS — E669 Obesity, unspecified: Secondary | ICD-10-CM

## 2023-03-30 DIAGNOSIS — K219 Gastro-esophageal reflux disease without esophagitis: Secondary | ICD-10-CM

## 2023-03-30 DIAGNOSIS — Z114 Encounter for screening for human immunodeficiency virus [HIV]: Secondary | ICD-10-CM

## 2023-03-30 DIAGNOSIS — Z1159 Encounter for screening for other viral diseases: Secondary | ICD-10-CM

## 2023-03-30 DIAGNOSIS — E782 Mixed hyperlipidemia: Secondary | ICD-10-CM

## 2023-03-30 DIAGNOSIS — E1169 Type 2 diabetes mellitus with other specified complication: Secondary | ICD-10-CM

## 2023-03-30 DIAGNOSIS — I480 Paroxysmal atrial fibrillation: Secondary | ICD-10-CM

## 2023-03-30 NOTE — Progress Notes (Signed)
SDW call  Patients wife was given pre-op instructions over the phone. Patients wife verbalized understanding of instructions provided.    PCP -Dr. Dana Allan  Cardiologist - Dr. Charlton Haws Pulmonary: Dr. Judeth Horn Nephrologist: [facility]   PPM/ICD - Denies   Chest x-ray - 07/29/2022 EKG -  02/24/2023 Stress Test - ECHO - 02/26/2023 Cardiac Cath - 02/24/2023  Sleep Study/sleep apnea/CPAP: Denies  Non-diabetic   Blood Thinner Instructions: Xarelto, last dose 03/28/2023 Aspirin Instructions:Denies   ERAS Protcol - No, NPO PRE-SURGERY Ensure or G2-    COVID TEST- n/a    Anesthesia review: Yes, HTN, kidney transplant   Patient denies shortness of breath, fever, cough and chest pain over the phone call  Your procedure is scheduled on Tuesday 03/31/2023  Report to [facility] Main Entrance "A" at    A.M., then check in with the Admitting office.  Call this number if you have problems the morning of surgery:  [phone]   If you have any questions prior to your surgery date call [phone]: Open Monday-Friday 8am-4pm If you experience any cold or flu symptoms such as cough, fever, chills, shortness of breath, etc. between now and your scheduled surgery, please notify us at the above number     Remember:  Do not eat or drink after midnight the night before your surgery Take these medicines the morning of surgery with A SIP OF WATER:  Atorvastatin, envarsus, metoprolol, myfortc, prednisone   As of today, STOP taking any Aspirin (unless otherwise instructed by your surgeon) Aleve, Naproxen, Ibuprofen, Motrin, Advil, Goody's, BC's, all herbal medications, fish oil, and all vitamins.

## 2023-03-30 NOTE — Anesthesia Preprocedure Evaluation (Signed)
Anesthesia Evaluation  Patient identified by MRN, date of birth, ID band Patient awake    Reviewed: Allergy & Precautions, NPO status , Patient's Chart, lab work & pertinent test results  History of Anesthesia Complications Negative for: history of anesthetic complications  Airway Mallampati: II  TM Distance: >3 FB Neck ROM: Full    Dental  (+) Upper Dentures, Dental Advisory Given, Edentulous Upper, Edentulous Lower   Pulmonary shortness of breath, neg sleep apnea, neg recent URI   breath sounds clear to auscultation       Cardiovascular hypertension, Pt. on medications and Pt. on home beta blockers (-) angina +CHF  + dysrhythmias Atrial Fibrillation  Rhythm:Irregular   1. Left ventricular ejection fraction, by estimation, is 60 to 65%. The  left ventricle has normal function. The left ventricle has no regional  wall motion abnormalities. There is mild left ventricular hypertrophy.  Left ventricular diastolic parameters  are indeterminate.   2. Right ventricular systolic function is normal. The right ventricular  size is normal. There is moderately elevated pulmonary artery systolic  pressure. The estimated right ventricular systolic pressure is 52.0 mmHg.   3. Left atrial size was moderately dilated.   4. Mild mitral valve regurgitation.   5. The aortic valve is tricuspid. Aortic valve regurgitation is not  visualized.   6. Aneurysm of the ascending aorta, measuring 46 mm.   7. The inferior vena cava is dilated in size with <50% respiratory  variability, suggesting right atrial pressure of 15 mmHg.      Neuro/Psych TIA Neuromuscular disease  negative psych ROS   GI/Hepatic Neg liver ROS,GERD  Medicated and Controlled,,  Endo/Other    Renal/GU ESRFRenal disease     Musculoskeletal  (+) Arthritis ,    Abdominal   Peds  Hematology  (+) Blood dyscrasia Lab Results      Component                Value                Date                      WBC                      6.1                 01/27/2023                HGB                      15.0                03/31/2023                HCT                      44.0                03/31/2023                MCV                      92                  01/27/2023                PLT  156                 01/27/2023             xarelto   Anesthesia Other Findings   Reproductive/Obstetrics                             Anesthesia Physical Anesthesia Plan  ASA: 4  Anesthesia Plan: MAC   Post-op Pain Management: Minimal or no pain anticipated   Induction: Intravenous  PONV Risk Score and Plan: 2 and Ondansetron and Propofol infusion  Airway Management Planned: Nasal Cannula, Natural Airway and Simple Face Mask  Additional Equipment: None  Intra-op Plan:   Post-operative Plan:   Informed Consent: I have reviewed the patients History and Physical, chart, labs and discussed the procedure including the risks, benefits and alternatives for the proposed anesthesia with the patient or authorized representative who has indicated his/her understanding and acceptance.     Dental advisory given  Plan Discussed with: CRNA  Anesthesia Plan Comments: (PAT note written 03/30/2023 by Shonna Chock, PA-C.  )       Anesthesia Quick Evaluation

## 2023-03-30 NOTE — Progress Notes (Signed)
Anesthesia Chart Review: [name] [name]  Case: [id number] Date/Time: 03/31/23 0954   Procedure: RIGHT ARM ARTERIOVENOUS FISTULA BANDING (Right)   Anesthesia type: Choice   Pre-op diagnosis: High output congestive heart failure   [facility]OM 12 / [facility]   Surgeons: Maeola Harman, MD       DISCUSSION: Patient is a 62 year old male scheduled for the above procedure. He has a RUE AVF that is not currently being used since he underwent LDKT on 01/13/19. Referred for AVF ligation versus banding by HF cardiologist due to hight out-flow CHF felt related to his AVF.  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 He had chronic elevation of his right hemidiaphragm.   Anesthesia team to evaluate on the day of surgery. He is on Envarsus XR, Myfortic, prednisone 5 mg daily and on Bactrim MWF given renal transplant.   VVS advised to hold Xarelto for 2 days prior to procedure.    VS:  BP Readings from Last 3 Encounters:  03/11/23 121/77  02/24/23 (!) 142/81  01/27/23 124/72   Pulse Readings from Last 3 Encounters:  03/11/23 60  02/24/23 (!) 59  01/27/23 70     PROVIDERS: Dana Allan, MD is PCP  - Debbe Odea, MD is primary cardiologist. Last visit 01/27/23 with Charlton Haws, MD and referred for RHC with [facility] cardiologist Dorthula Nettles, DO for dyspnea with concern for high output HF.  - Steffanie Dunn, MD is EP cardiologist. See by Alphonzo Severance, PA on 10/27/22 at [facility]. Continue Toprol. Continue Xarelto for 3 months post ablation recommended. Saw Dr. Chryl Heck 12/29/22 and reported a few episodes of racing heart  beat. Decision to continue Xarelto for now, and will consider evaluation for LAA occlusion (Watchman Device). Was being considered for this in late May 2023, but appears plans are now on hold. Audie Box, DO is pulmonologist, pending evaluation currently scheduled for 04/02/23.   LABS: For day of surgery. Most recent results in [facility] include: Lab Results  Component Value Date   WBC 6.1 01/27/2023   HGB 13.3 02/24/2023   HGB 13.6 02/24/2023   HCT 39.0 02/24/2023   HCT 40.0 02/24/2023   PLT 156 01/27/2023   GLUCOSE 141 (H) 01/27/2023   NA 140 02/24/2023   NA 140 02/24/2023   K 4.5 02/24/2023   K 4.5 02/24/2023   CL 101 01/27/2023   CREATININE 1.82 (H) 01/27/2023   BUN 15 01/27/2023   CO2 25 01/27/2023   TSH 1.170 01/27/2023   INR 2.1 (H) 04/19/2022   HGBA1C 5.8 (H) 05/23/2022    PFTs 01/29/23: FVC 2.49 (51%), post 2.51 (52%). FEV1 2.08 (57%), post 2.15 (59%). DLCO unc/cor 19.50 (71%).   IMAGES: CT Chest 02/16/23: IMPRESSION: 1. No acute findings in the chest. Specifically, no findings to explain the patient's history of shortness of breath. 2. Enlargement of the pulmonary outflow tract/main pulmonary arteries suggests pulmonary arterial hypertension. 3. Asymmetric elevation right hemidiaphragm with probable chronic atelectasis/scarring at the right base. 4. Large varices right anterior chest wall and axillary region, likely secondary to upper extremity AV fistula. 5.  Aortic Atherosclerosis (ICD10-I70.0).  Sniff Test 02/16/23: IMPRESSION: 1. Chronic moderate elevation of the right hemidiaphragm at rest. 2. Paradoxical  elevation of the right hemidiaphragm with sniff maneuver, compatible with right diaphragmatic plegia. 3. Normal muscular function of the left hemidiaphragm.    EKG: 02/24/23: Sinus bradycardia at 58 bpm Otherwise normal ECG When compared with ECG of 27-Oct-2022 10:58, No significant change since last tracing Confirmed by Riley Lam [phone])  on 02/24/2023 1:31:57 PM   CV: Echo 02/26/23: IMPRESSIONS:  1. Left ventricular ejection fraction, by estimation, is 60 to 65%. The  left ventricle has normal function. The left ventricle has no regional  wall motion abnormalities. There is mild left ventricular hypertrophy.  Left ventricular diastolic parameters  are indeterminate.   2. Right ventricular systolic function is normal. The right ventricular  size is normal. There is moderately elevated pulmonary artery systolic  pressure. The estimated right ventricular systolic pressure is 52.0 mmHg.   3. Left atrial size was moderately dilated.   4. Mild mitral valve regurgitation.   5. The aortic valve is tricuspid. Aortic valve regurgitation is not  visualized.   6. Aneurysm of the ascending aorta, measuring 46 mm.   7. The inferior vena cava is dilated in size with <50% respiratory  variability, suggesting right atrial pressure of 15 mmHg.  - Conclusion(s)/Recommendation(s): Compared to TTE 12/20/2021,size of the ascending aorta has increased.    RHC 02/24/23: HEMODYNAMICS: RA:                  6 mmHg (mean) RV:                  50/10 mmHg PA:                  54/14 mmHg (35 mean) PCWP:            15-20 with V waves (difficulty completing occluding for PCWP)                                       Thermodilution CO/CI 12.7 L/min, 5.7 L/min/m2 PA sat: 73-74%            NIBP: 147/70                                      TPG                 15  mmHg                                            PVR                 1.2 Wood Units  PAPi                6.7     Hemodynamics after inflating cuff to 120-130 systolic PA: 42/10 (33) Thermodilution: 9.68 L/min, 4.18 L/min/m2   IMPRESSION: Normal pre and post capillary filling pressures Moderately elevated PA mean with normal PVR likely secondary to high output.  Elevated cardiac output / cardiac index consistent with high output heart failure secondary to AVF.  After inflation of  manual cuff to 120-130 systolic for 3 minutes there was a reduction in PA systolic and a reduction in thermodilution cardiac output by 3 L/min.    RECOMMENDATIONS: Consider  evaluation for ligation or banding to reduce flows through right arm AVF.    Long term monitor 11/21/21 - 12/05/21: Patient had a min HR of 39 bpm, max HR of 218 bpm, and avg HR of 61 bpm. Predominant underlying rhythm was Sinus Rhythm. 2 Ventricular Tachycardia runs occurred, the run with the fastest interval lasting 5 beats with a max rate of 156 bpm, the longest  lasting 13 beats with an avg rate of 129 bpm. 34 Supraventricular Tachycardia runs occurred, the run with the fastest interval lasting 5 beats with a max rate of 218 bpm, the longest lasting 13.5 secs with an avg rate of 100 bpm. Ventricular Tachycardia  and Supraventricular Tachycardia were detected within +/- 45 seconds of symptomatic patient event(s). Isolated SVEs were occasional (3.4%, 38227),  No atrial fibrillation or flutter was noted.   Nuclear Stress Test 02/05/22 ([facility]): 1.  MYOCARDIAL PERFUSION: No evidence of inducible ischemia or scar/hibernating myocardium.  2.  LEFT VENTRICULAR EJECTION FRACTION: Normal. LVEF 56%. However, the heart is dilated.  3.  REGIONAL WALL MOTION: Septal dyskinesis, otherwise motion within normal limits.    LHC 05/10/18 ([facility]): Coronary Findings Diagnostic Dominance: Left  Left Anterior Descending: Mid LAD lesion, 20% stenosed. First Diagonal Branch: 1st Diag lesion, 40% stenosed.  Ramus Intermedius: The vessel is small.  Left Circumflex: Ost Cx lesion, 30% stenosed.  - Non-obstructive coronary artery disease.   Past Medical History:  Diagnosis Date   Abnormal stress test 05/07/2018   Actinic keratoses 05/01/2020   Arthritis    Ascending aorta dilatation (HCC)    46 mm ascending thoracic aorta aneurysm by 02/26/23 TTE   Atrial fibrillation (HCC) 11/28/2016   Atrial fibrillation with RVR (HCC)     Cataract    bilateral- right removed, left present   Cervical radiculopathy 03/01/2020   Cherry angioma 05/01/2020   Chest pain syndrome 01/26/2020   Chronic diastolic CHF (congestive heart failure) (HCC)    Chronic kidney disease, stage V (HCC) 09/18/2014   Complication of anesthesia    problem with bladder not waking up after surgery-for right nephrectomy   Dermatofibroma 05/01/2020   Dry skin 05/01/2020   Dysrhythmia    Elevated hemidiaphragm 02/16/2023   right   Encounter for adequacy testing for dialysis (HCC) 09/18/2014   Encounter for therapeutic drug monitoring 12/05/2016   Enlarged kidney    right   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   Folliculitis 05/01/2020   GERD without esophagitis 12/05/2020   Gout    History of gout 01/30/2020   HLD (hyperlipidemia) 11/28/2016   Hypertension    Hypokalemia 11/28/2016   Hypomagnesemia 01/30/2020   Immunosuppression (HCC) 01/17/2019   Immunosuppressive management encounter following kidney transplant 01/25/2019   Lapband APS July 2015 06/20/2014   Left shoulder pain 06/14/2020   Mixed hyperlipidemia 11/13/2014   Morbid obesity BMI 42 01/04/2014   Multiple benign nevi 05/01/2020   Paroxysmal A-fib (HCC) 04/22/2022   Renal disorder    Renal failure 07/13/2015   Renal insufficiency    KIDNEY DISEASE - FOLLOWED BY DR. Eliott Nine NOTES ON CHART   Renal insufficiency 04/22/2022   KIDNEY DISEASE - FOLLOWED BY DR. 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 12/24/2018   Serum total bilirubin elevated 01/30/2020   Skin tag 05/01/2020   Stage 3b chronic kidney disease (HCC) 01/30/2020  Status post gastric banding 06/20/2014   Status post laparoscopic cholecystectomy August 2016 07/13/2015   Status post living-donor kidney transplantation 01/17/2019   Sun-damaged  skin 05/01/2020   TIA (transient ischemic attack) 07/14/2017   Vertigo     Past Surgical History:  Procedure Laterality Date   ATRIAL FIBRILLATION ABLATION N/A 09/25/2022   Procedure: ATRIAL FIBRILLATION ABLATION;  Surgeon: Lanier Prude, MD;  [facility]: [facility] INVAS[facility] CV LAB;  Service: Cardiovascular;  Laterality: N/A;   AV FISTULA PLACEMENT     "IT CLOSED UP AND DOES NOT WORK"   AV FISTULA PLACEMENT Right 10/27/2014   Procedure: ARTERIOVENOUS (AV) FISTULA CREATION;  Surgeon: Fransisco Hertz, MD;  Location: [facility] OR;  Service: Vascular;  Laterality: Right;   AV FISTULA PLACEMENT Right 08/22/2015   Procedure: BRACHIOCEPHALIC ARTERIOVENOUS (AV) FISTULA CREATION;  Surgeon: Fransisco Hertz, MD;  Location: [facility] OR;  Service: Vascular;  Laterality: Right;   CATARACTS REMOVED Right    CHOLECYSTECTOMY N/A 07/13/2015   Procedure: LAPAROSCOPIC CHOLECYSTECTOMY WITH INTRAOPERATIVE CHOLANGIOGRAM;  Surgeon: Luretha Murphy, MD;  Location: [facility] ORS;  Service: General;  Laterality: N/A;   EYE SURGERY     left cataract surgery-lens implant   HERNIA REPAIR     INSERTION OF MESH N/A 06/20/2014   Procedure: INSERTION OF MESH;  Surgeon: Valarie Merino, MD;  Location: [facility] ORS;  Service: General;  Laterality: N/A;   KNEE SURGERY  2001   LAPAROSCOPIC GASTRIC BANDING N/A 06/20/2014   Procedure: LAPAROSCOPIC GASTRIC BANDING and VENTRAL HERNIA REPAIR WITH MESH;  Surgeon: Valarie Merino, MD;  Location: [facility] ORS;  Service: General;  Laterality: N/A;   RIGHT HEART CATH N/A 02/24/2023   Procedure: RIGHT HEART CATH;  Surgeon: Dorthula Nettles, DO;  [facility] CV LAB;  Service: Cardiovascular;  Laterality: N/A;   TEE WITHOUT CARDIOVERSION N/A 09/25/2022   Procedure: TRANSESOPHAGEAL ECHOCARDIOGRAM (TEE);  Surgeon: Lanier Prude, MD;  Location: [facility] INVASIVE CV LAB;  Service: Cardiovascular;  Laterality: N/A;   TOTAL NEPHRECTOMY Right     MEDICATIONS:  sodium chloride flush (NS) 0.9 % injection 3 mL    atorvastatin  (LIPITOR) 40 MG tablet   diphenhydramine-acetaminophen (TYLENOL PM) 25-500 MG TABS tablet   ENVARSUS XR 1 MG TB24   furosemide (LASIX) 40 MG tablet   magnesium oxide (MAG-OX) 400 MG tablet   Menthol (BIOFREEZE) 10.5 % AERO   metoprolol succinate (TOPROL-XL) 25 MG 24 hr tablet   mycophenolate (MYFORTIC) 180 MG EC tablet   omeprazole (PRILOSEC) 20 MG capsule   predniSONE (DELTASONE) 5 MG tablet   rivaroxaban (XARELTO) 20 MG TABS tablet   sulfamethoxazole-trimethoprim (BACTRIM) 400-80 MG tablet     Shonna Chock, PA-C Surgical Short Stay/Anesthesiology [facility] Phone [phone] [facility] Phone [phone] 03/30/2023 11:40 AM

## 2023-03-31 ENCOUNTER — Encounter (HOSPITAL_COMMUNITY): Payer: Self-pay | Admitting: Vascular Surgery

## 2023-03-31 ENCOUNTER — Ambulatory Visit (HOSPITAL_COMMUNITY)
Admission: RE | Admit: 2023-03-31 | Discharge: 2023-03-31 | Disposition: A | Discharge status: Home / Self Care | Payer: PPO | Attending: Vascular Surgery | Admitting: Vascular Surgery

## 2023-03-31 ENCOUNTER — Other Ambulatory Visit (HOSPITAL_COMMUNITY): Payer: Self-pay

## 2023-03-31 ENCOUNTER — Ambulatory Visit (HOSPITAL_BASED_OUTPATIENT_CLINIC_OR_DEPARTMENT_OTHER): Payer: PPO | Admitting: Vascular Surgery

## 2023-03-31 ENCOUNTER — Encounter (HOSPITAL_COMMUNITY)
Admission: RE | Disposition: A | Discharge status: Home / Self Care | Payer: Self-pay | Source: Home / Self Care | Attending: Vascular Surgery

## 2023-03-31 ENCOUNTER — Other Ambulatory Visit: Payer: Self-pay

## 2023-03-31 ENCOUNTER — Ambulatory Visit (HOSPITAL_COMMUNITY): Payer: PPO | Admitting: Vascular Surgery

## 2023-03-31 DIAGNOSIS — D759 Disease of blood and blood-forming organs, unspecified: Secondary | ICD-10-CM | POA: Insufficient documentation

## 2023-03-31 DIAGNOSIS — I5083 High output heart failure: Secondary | ICD-10-CM | POA: Diagnosis present

## 2023-03-31 DIAGNOSIS — I132 Hypertensive heart and chronic kidney disease with heart failure and with stage 5 chronic kidney disease, or end stage renal disease: Secondary | ICD-10-CM | POA: Diagnosis not present

## 2023-03-31 DIAGNOSIS — Z940 Kidney transplant status: Secondary | ICD-10-CM

## 2023-03-31 DIAGNOSIS — T82898A Other specified complication of vascular prosthetic devices, implants and grafts, initial encounter: Secondary | ICD-10-CM | POA: Diagnosis not present

## 2023-03-31 DIAGNOSIS — I4891 Unspecified atrial fibrillation: Secondary | ICD-10-CM | POA: Insufficient documentation

## 2023-03-31 DIAGNOSIS — I509 Heart failure, unspecified: Secondary | ICD-10-CM | POA: Diagnosis not present

## 2023-03-31 DIAGNOSIS — I770 Arteriovenous fistula, acquired: Secondary | ICD-10-CM | POA: Diagnosis not present

## 2023-03-31 DIAGNOSIS — I120 Hypertensive chronic kidney disease with stage 5 chronic kidney disease or end stage renal disease: Secondary | ICD-10-CM | POA: Insufficient documentation

## 2023-03-31 DIAGNOSIS — N186 End stage renal disease: Secondary | ICD-10-CM | POA: Insufficient documentation

## 2023-03-31 HISTORY — DX: Thoracic aortic ectasia: I77.810

## 2023-03-31 HISTORY — PX: REVISON OF ARTERIOVENOUS FISTULA: SHX6074

## 2023-03-31 LAB — POCT I-STAT, CHEM 8
BUN: 23 mg/dL (ref 8–23)
Calcium, Ion: 1.21 mmol/L (ref 1.15–1.40)
Chloride: 100 mmol/L (ref 98–111)
Creatinine, Ser: 2 mg/dL — ABNORMAL HIGH (ref 0.61–1.24)
Glucose, Bld: 109 mg/dL — ABNORMAL HIGH (ref 70–99)
HCT: 44 % (ref 39.0–52.0)
Hemoglobin: 15 g/dL (ref 13.0–17.0)
Potassium: 3.7 mmol/L (ref 3.5–5.1)
Sodium: 139 mmol/L (ref 135–145)
TCO2: 30 mmol/L (ref 22–32)

## 2023-03-31 SURGERY — REVISON OF ARTERIOVENOUS FISTULA
Anesthesia: General | Site: Arm Upper | Laterality: Right

## 2023-03-31 MED ORDER — LIDOCAINE-EPINEPHRINE (PF) 1 %-1:200000 IJ SOLN
INTRAMUSCULAR | Status: DC | PRN
  Administered 2023-03-31: 23 mL

## 2023-03-31 MED ORDER — CHLORHEXIDINE GLUCONATE 4 % EX LIQD
60.0000 mL | Freq: Once | CUTANEOUS | Status: DC
  Filled 2023-03-31: qty 60

## 2023-03-31 MED ORDER — SODIUM CHLORIDE 0.9 % IV SOLN: INTRAVENOUS | Status: DC

## 2023-03-31 MED ORDER — ACETAMINOPHEN 160 MG/5ML PO SOLN: 1000.0000 mg | Freq: Once | ORAL | Status: DC | PRN

## 2023-03-31 MED ORDER — PROPOFOL 500 MG/50ML IV EMUL
INTRAVENOUS | Status: DC | PRN
  Administered 2023-03-31: 125 ug/kg/min via INTRAVENOUS

## 2023-03-31 MED ORDER — HEPARIN 6000 UNIT IRRIGATION SOLUTION
Status: AC
  Filled 2023-03-31: qty 500

## 2023-03-31 MED ORDER — FENTANYL CITRATE (PF) 250 MCG/5ML IJ SOLN
INTRAMUSCULAR | Status: DC | PRN
  Administered 2023-03-31: 50 ug via INTRAVENOUS

## 2023-03-31 MED ORDER — HEPARIN 6000 UNIT IRRIGATION SOLUTION
Status: DC | PRN
  Administered 2023-03-31: 1

## 2023-03-31 MED ORDER — CHLORHEXIDINE GLUCONATE 0.12 % MT SOLN
OROMUCOSAL | Status: AC
  Administered 2023-03-31: 15 mL via OROMUCOSAL
  Filled 2023-03-31: qty 15

## 2023-03-31 MED ORDER — LIDOCAINE-EPINEPHRINE (PF) 1 %-1:200000 IJ SOLN
INTRAMUSCULAR | Status: AC
  Filled 2023-03-31: qty 30

## 2023-03-31 MED ORDER — FENTANYL CITRATE (PF) 100 MCG/2ML IJ SOLN: 25.0000 ug | INTRAMUSCULAR | Status: DC | PRN

## 2023-03-31 MED ORDER — CEFAZOLIN SODIUM-DEXTROSE 2-4 GM/100ML-% IV SOLN
INTRAVENOUS | Status: AC
  Filled 2023-03-31: qty 100

## 2023-03-31 MED ORDER — PHENYLEPHRINE HCL-NACL 20-0.9 MG/250ML-% IV SOLN
INTRAVENOUS | Status: DC | PRN
  Administered 2023-03-31: 10 ug/min via INTRAVENOUS

## 2023-03-31 MED ORDER — PROPOFOL 10 MG/ML IV BOLUS
INTRAVENOUS | Status: DC | PRN
  Administered 2023-03-31: 30 mg via INTRAVENOUS

## 2023-03-31 MED ORDER — ACETAMINOPHEN 500 MG PO TABS: 1000.0000 mg | ORAL_TABLET | Freq: Once | ORAL | Status: DC | PRN

## 2023-03-31 MED ORDER — RIVAROXABAN 20 MG PO TABS
20.0000 mg | ORAL_TABLET | Freq: Every day | ORAL | 3 refills | Status: DC
  Filled 2023-03-31: qty 90, 90d supply, fill #0

## 2023-03-31 MED ORDER — CEFAZOLIN IN SODIUM CHLORIDE 3-0.9 GM/100ML-% IV SOLN
3.0000 g | INTRAVENOUS | Status: AC
  Administered 2023-03-31: 3 g via INTRAVENOUS
  Filled 2023-03-31: qty 100

## 2023-03-31 MED ORDER — OXYCODONE-ACETAMINOPHEN 5-325 MG PO TABS
1.0000 | ORAL_TABLET | ORAL | 0 refills | Status: DC | PRN
  Filled 2023-03-31: qty 20, 4d supply, fill #0

## 2023-03-31 MED ORDER — FENTANYL CITRATE (PF) 250 MCG/5ML IJ SOLN
INTRAMUSCULAR | Status: AC
  Filled 2023-03-31: qty 5

## 2023-03-31 MED ORDER — CHLORHEXIDINE GLUCONATE 0.12 % MT SOLN
15.0000 mL | OROMUCOSAL | Status: AC
  Filled 2023-03-31: qty 15

## 2023-03-31 MED ORDER — 0.9 % SODIUM CHLORIDE (POUR BTL) OPTIME
TOPICAL | Status: DC | PRN
  Administered 2023-03-31: 1000 mL

## 2023-03-31 MED ORDER — ACETAMINOPHEN 10 MG/ML IV SOLN: 1000.0000 mg | Freq: Once | INTRAVENOUS | Status: DC | PRN

## 2023-03-31 SURGICAL SUPPLY — 42 items
ADH SKN CLS APL DERMABOND .7 (GAUZE/BANDAGES/DRESSINGS) ×1
ARMBAND PINK RESTRICT EXTREMIT (MISCELLANEOUS) ×1 IMPLANT
BAG COUNTER SPONGE SURGICOUNT (BAG) ×1 IMPLANT
BAG SPNG CNTER NS LX DISP (BAG) ×1
BALLN MUSTANG 5X60X75 (BALLOONS) ×1
BALLN MUSTANG 6X80X75 (BALLOONS) ×1
BALLOON MUSTANG 5X60X75 (BALLOONS) IMPLANT
BALLOON MUSTANG 6X80X75 (BALLOONS) IMPLANT
CANISTER SUCT 3000ML PPV (MISCELLANEOUS) ×1 IMPLANT
CLIP LIGATING EXTRA MED SLVR (CLIP) ×1 IMPLANT
CLIP LIGATING EXTRA SM BLUE (MISCELLANEOUS) ×1 IMPLANT
COVER PROBE W GEL 5X96 (DRAPES) ×1 IMPLANT
DERMABOND ADVANCED .7 DNX12 (GAUZE/BANDAGES/DRESSINGS) ×1 IMPLANT
ELECT REM PT RETURN 9FT ADLT (ELECTROSURGICAL) ×1
ELECTRODE REM PT RTRN 9FT ADLT (ELECTROSURGICAL) ×1 IMPLANT
GLOVE BIO SURGEON STRL SZ7.5 (GLOVE) ×1 IMPLANT
GOWN STRL REUS W/ TWL LRG LVL3 (GOWN DISPOSABLE) ×2 IMPLANT
GOWN STRL REUS W/ TWL XL LVL3 (GOWN DISPOSABLE) ×1 IMPLANT
GOWN STRL REUS W/TWL LRG LVL3 (GOWN DISPOSABLE) ×2
GOWN STRL REUS W/TWL XL LVL3 (GOWN DISPOSABLE) ×1
KIT BASIN OR (CUSTOM PROCEDURE TRAY) ×1 IMPLANT
KIT TURNOVER KIT B (KITS) ×1 IMPLANT
NS IRRIG 1000ML POUR BTL (IV SOLUTION) ×1 IMPLANT
PACK CV ACCESS (CUSTOM PROCEDURE TRAY) ×1 IMPLANT
PAD ARMBOARD 7.5X6 YLW CONV (MISCELLANEOUS) ×2 IMPLANT
POWDER SURGICEL 3.0 GRAM (HEMOSTASIS) IMPLANT
SHEATH MICROPUNCTURE PEDAL 4FR (SHEATH) IMPLANT
SLING ARM FOAM STRAP LRG (SOFTGOODS) IMPLANT
SLING ARM FOAM STRAP MED (SOFTGOODS) IMPLANT
STOPCOCK 3WAY HIGH PRESSURE (MISCELLANEOUS) IMPLANT
SUT MNCRL AB 4-0 PS2 18 (SUTURE) ×1 IMPLANT
SUT PROLENE 6 0 BV (SUTURE) ×1 IMPLANT
SUT SILK 2 0 (SUTURE) ×2
SUT SILK 2-0 18XBRD TIE 12 (SUTURE) IMPLANT
SUT VIC AB 3-0 SH 27 (SUTURE) ×1
SUT VIC AB 3-0 SH 27X BRD (SUTURE) ×1 IMPLANT
SUT VIC AB 4-0 PS2 18 (SUTURE) IMPLANT
SYR 20CC LL (SYRINGE) IMPLANT
TOWEL GREEN STERILE (TOWEL DISPOSABLE) ×1 IMPLANT
UNDERPAD 30X36 HEAVY ABSORB (UNDERPADS AND DIAPERS) ×1 IMPLANT
WATER STERILE IRR 1000ML POUR (IV SOLUTION) ×1 IMPLANT
WIRE BENTSON .035X145CM (WIRE) IMPLANT

## 2023-03-31 NOTE — Interval H&P Note (Signed)
History and Physical Interval Note:  03/31/2023 7:52 AM  [name] [name]  has presented today for surgery, with the diagnosis of High output congestive heart failure.  The various methods of treatment have been discussed with the patient and family. After consideration of risks, benefits and other options for treatment, the patient has consented to  Procedure(s): RIGHT ARM ARTERIOVENOUS FISTULA BANDING (Right) as a surgical intervention.  The patient's history has been reviewed, patient examined, no change in status, stable for surgery.  I have reviewed the patient's chart and labs.  Questions were answered to the patient's satisfaction.     Lemar Livings

## 2023-03-31 NOTE — Discharge Instructions (Signed)
   [facility]  Discharge Instructions  AV Fistula or Graft Surgery for Dialysis Access  Please refer to the following instructions for your post-procedure care. Your surgeon or physician assistant will discuss any changes with you.  Activity  You may drive the day following your surgery, if you are comfortable and no longer taking prescription pain medication. Resume full activity as the soreness in your incision resolves.  Bathing/Showering  You may shower after you go home. Keep your incision dry for 48 hours. Do not soak in a bathtub, hot tub, or swim until the incision heals completely. You may not shower if you have a hemodialysis catheter.  Incision Care  Clean your incision with mild soap and water after 48 hours. Pat the area dry with a clean towel. You do not need a bandage unless otherwise instructed. Do not apply any ointments or creams to your incision. You may have skin glue on your incision. Do not peel it off. It will come off on its own in about one week. Your arm may swell a bit after surgery. To reduce swelling use pillows to elevate your arm so it is above your heart. Your doctor will tell you if you need to lightly wrap your arm with an ACE bandage.  Diet  Resume your normal diet. There are not special food restrictions following this procedure. In order to heal from your surgery, it is CRITICAL to get adequate nutrition. Your body requires vitamins, minerals, and protein. Vegetables are the best source of vitamins and minerals. Vegetables also provide the perfect balance of protein. Processed food has little nutritional value, so try to avoid this.  Medications  Resume taking all of your medications. If your incision is causing pain, you may take over-the counter pain relievers such as acetaminophen (Tylenol). If you were prescribed a stronger pain medication, please be aware these medications can cause nausea and constipation. Prevent  nausea by taking the medication with a snack or meal. Avoid constipation by drinking plenty of fluids and eating foods with high amount of fiber, such as fruits, vegetables, and grains.  Do not take Tylenol if you are taking prescription pain medications.  Follow up Your surgeon may want to see you in the office following your access surgery. If so, this will be arranged at the time of your surgery.  Please call us immediately for any of the following conditions:  Increased pain, redness, drainage (pus) from your incision site Fever of 101 degrees or higher Severe or worsening pain at your incision site Hand pain or numbness.  Reduce your risk of vascular disease:  Stop smoking. If you would like help, call [facility] at 1-800-QUIT-NOW ((530)345-8983) or [place] at 773-624-2016  Manage your cholesterol Maintain a desired weight Control your diabetes Keep your blood pressure down  Dialysis  It will take several weeks to several months for your new dialysis access to be ready for use. Your surgeon will determine when it is okay to use it. Your nephrologist will continue to direct your dialysis. You can continue to use your Permcath until your new access is ready for use.   03/31/2023 [name] [name] [id number] 02-15-1961  Surgeon(s): Maeola Harman, MD  Procedure(s): RIGHT ARM ARTERIOVENOUS FISTULA BANDING  *If fistula needs to be used for dialysis, you can use it at any spot above the incision site until the incision fully heals  If you have any questions, please call the office at [phone].

## 2023-03-31 NOTE — Op Note (Signed)
Patient name: [name] [name] MRN: [id number] DOB: 05/06/1961 Sex: male  03/31/2023 Pre-operative Diagnosis: History of end-stage renal disease now status post living unrelated kidney donor, high-output cardiac failure from right arm AV fistula Post-operative diagnosis:  Same Surgeon:  Luanna Salk. Randie Heinz, MD Assistant: Loel Dubonnet, PA Procedure Performed: 1.  Ultrasound-guided cannulation right arm AV fistula 2.  Banding of right arm AV fistula around 6 mm balloon  Indications: 62 year old male with history of end-stage renal disease previously on dialysis via fistula.  He now has a living unrelated kidney donation that is functioning well with high-output cardiac failure from his AV fistula which is noted to be very large with very high flow volumes and he does wish to keep this fistula.  He is now indicated for fistula banding.  Findings: Fistula was very large I initially banded around the 5 mm balloon this did not appear to have any flow distally.  I ultimately banded around a 6 mm balloon although not tightly so that there was still flow distally in the fistula.   Procedure:  The patient was identified in the holding area and taken to the operating room where is placed upon operative table and MAC anesthesia was induced.  He was sterilely prepped and draped in the right upper extremity usual fashion, antibiotics were minister timeout was called.  Ultrasound was used to identify the brachial artery and the brachial artery to cephalic vein anastomosis.  I anesthetized the area of the previous incision and open the previous incision just at the level of the antecubitum.  I dissected the fistula out for several centimeters and back to the brachial artery although I did not fully dissect this free.  There was 1 large branch coming off the fistula which I ligated with a 2-0 silk tie.  I then used ultrasound to cannulate the fistula higher in the arm in a retrograde fashion using a micropuncture  needle followed by wire and a sheath.  I then placed a 5 French sheath and a Bentson wire and placed a 5 mm balloon down to where I could palpate this near the arteriovenous anastomosis and inflated this.  Fistula was clamped proximally.  Through the sheath I irrigated the fistula with heparinized saline.  I then serially tied 2-0 silk sutures over the fistula.  When I released the clamp the fistula did appear too tight I could not feel flow distally.  Therefore I ended most of the ties and replaced the 5 mm balloon with a 6 mm balloon over the wire.  I inflated this to nominal pressure and then began to tie this down with serial 2-0 silk ties.  When I released the clamp I did have to cut a few of the ties out as they appear too tight and I placed some other ties that were somewhat looser.  Ultimately there was brisk flow proximally through the fistula and there was a palpable radial pulse confirmed with Doppler.  The fistula was significantly smaller near the arteriovenous anastomosis but there was flow distally so I was satisfied.  I irrigated the wound obtain hemostasis and then closed a layer over the fistula with 3-0 Vicryl the skin was closed with 4-0 Monocryl.  Dermabond was placed at the skin site.  Where the sheath was placed I closed with a 4-0 Monocryl and the wire and sheath were removed.  The patient was then awakened from anesthesia having tolerated the procedure well without immediate complication.  All counts were correct  at completion.  EBL: 20 cc    [name], MD [facility] [place] Office: [phone] Pager: [phone]

## 2023-03-31 NOTE — Transfer of Care (Signed)
Immediate Anesthesia Transfer of Care Note  Patient: [name] [name]  Procedure(s) Performed: RIGHT ARM ARTERIOVENOUS FISTULA BANDING (Right: Arm Upper)  Patient Location: PACU  Anesthesia Type:MAC  Level of Consciousness: awake, alert , and oriented  Airway & Oxygen Therapy: Patient Spontanous Breathing  Post-op Assessment: Report given to RN and Post -op Vital signs reviewed and stable  Post vital signs: Reviewed and stable  Last Vitals:  Vitals Value Taken Time  BP 179/95 03/31/23 1203  Temp 98   Pulse 61 03/31/23 1205  Resp 18 03/31/23 1205  SpO2 93 % 03/31/23 1205  Vitals shown include unvalidated device data.  Last Pain:  Vitals:   03/31/23 0756  TempSrc: Oral         Complications: No notable events documented.

## 2023-04-01 ENCOUNTER — Encounter (HOSPITAL_COMMUNITY): Payer: Self-pay | Admitting: Vascular Surgery

## 2023-04-02 ENCOUNTER — Telehealth: Payer: Self-pay | Admitting: Cardiology

## 2023-04-02 ENCOUNTER — Ambulatory Visit: Payer: PPO | Admitting: Pulmonary Disease

## 2023-04-02 ENCOUNTER — Encounter: Payer: Self-pay | Admitting: Pulmonary Disease

## 2023-04-02 VITALS — BP 122/68 | HR 61 | Ht 70.0 in | Wt 258.2 lb

## 2023-04-02 DIAGNOSIS — R0609 Other forms of dyspnea: Secondary | ICD-10-CM

## 2023-04-02 NOTE — Patient Instructions (Signed)
Nice to meet you  I think you have good reasons for your shortness of breath  These include pulmonary hypertension or the elevated blood pressure that measured during the catheterization going into your lungs.  I think there are 2 contributions to this, one of the fistula but now status post banding will hopefully show some mild improvement.  And then 2, a little bit of a leaky valve and extra fluid buildup from the left side of the heart that is backed up to the right.  The leaky valve and extra fluid buildup from the left of the heart alone can also cause shortness of breath.  In addition, the right diaphragm not working or being paralyzed also can cause shortness of breath.  However, given her shortness of breath seem to acutely come on after the ablation for atrial fibrillation, I think something called pulmonary vein stenosis is a possibility.  I will send a message to Dr. Lalla Brothers and Dr. Eden Emms so we can confer together and come up with a plan for further investigation if necessary.  Please let me know if her from [place] in the next several days.  Return to clinic as needed with Dr. Judeth Horn, please let me know if I can help in any way moving forward with any new symptoms that may arise

## 2023-04-02 NOTE — Progress Notes (Signed)
@Patient  ID: [name] [name], male    DOB: 09-06-1961, 62 y.o.   MRN: [id number]  Chief Complaint  Patient presents with   Consult    Referred by cardiologist for increased SOB since October 2023. States the SOB occurs with exertion or attempting to lay flat in bed.     Referring provider: Wendall Stade, MD  HPI:   62 y.o. man whom we are seeing for evaluation of dyspnea on exertion.  Multiple cardiology reviewed.  Operative report from atrial fibrillation ablation 08/2022 reviewed.  Patient significant dyspnea.  Not until October 2023.  Ablation for atrial fibrillation was performed.  He had immediately that night and the next day he noted changes in his breathing that persisted.  Significant shortness of breath when he lies flat.  Significant dyspnea with exertion.  All other symptoms that started immediately thereafter.  Expected it would improve over time may be some pericardial inflammation.  Unfortunate symptoms have persisted.  This led to ongoing workup including pulmonary function test 01/2023 full interpretation below but consistent with known right hemidiaphragm paralysis as well as subsequent discovery of pulmonary hypertension.  Intermittent cardiac catheterization 01/2023 reviewed that demonstrated no significant coronary disease but did show pulmonary hypertension, PVR less than 2, wedge pressure of 20.  He had echocardiogram shortly thereafter it did demonstrate mitral valve regurgitation left atrial dilation, and signs consistent with elevated pulmonary pressures already confirmed on prior right heart cath.  We discussed at length the multiple etiologies of his dyspnea.  He has had several chest x-rays over the last 10 years, dating back to at least 2015 multiple reviewed that demonstrate right hemidiaphragm elevation.  CT chest without contrast 01/2023 reviewed interpret as clear lungs with  hypoventilatory right lower lobe atelectasis with elevated right  hemidiaphragm.   Questionaires / Pulmonary Flowsheets:   ACT:      No data to display          MMRC:     No data to display          Epworth:     01/04/2014   10:47 AM  Results of the Epworth flowsheet  Sitting and reading 2  Watching TV 3  As a passenger in a car for an hour without a break 2  Lying down to rest in the afternoon when circumstances permit 3  Sitting and talking to someone 1  Sitting quietly after a lunch without alcohol 0  In a car, while stopped for a few minutes in traffic 0    Tests:   FENO:  No results found for: "NITRICOXIDE"  PFT:    Latest Ref Rng & Units 01/29/2023    2:45 PM  PFT Results  FVC-Pre L 2.49   FVC-Predicted Pre % 51   FVC-Post L 2.51   FVC-Predicted Post % 52   Pre FEV1/FVC % % 83   Post FEV1/FCV % % 86   FEV1-Pre L 2.08   FEV1-Predicted Pre % 57   FEV1-Post L 2.15   DLCO uncorrected ml/min/mmHg 19.05   DLCO UNC% % 68   DLCO corrected ml/min/mmHg 19.50   DLCO COR %Predicted % 70   DLVA Predicted % 112   TLC L 5.15   TLC % Predicted % 71   RV % Predicted % 82   Spirometry suggestive of moderate restriction versus air trapping.  No bronchodilator response.  Lung volumes reveal mild restriction with severely low ERV suggestive of extraparenchymal restriction.  DLCO is mildly reduced.  WALK:      No data to display          Imaging: Personally reviewed and as per EMR discussion this note No results found.  Lab Results: Personally reviewed CBC    Component Value Date/Time   WBC 6.1 01/27/2023 1209   WBC 6.7 09/23/2022 0657   RBC 4.47 01/27/2023 1209   RBC 4.64 09/23/2022 0657   HGB 15.0 03/31/2023 0808   HGB 13.8 01/27/2023 1209   HCT 44.0 03/31/2023 0808   HCT 41.2 01/27/2023 1209   PLT 156 01/27/2023 1209   MCV 92 01/27/2023 1209   MCH 30.9 01/27/2023 1209   MCH 29.5 09/23/2022 0657   MCHC 33.5 01/27/2023 1209   MCHC 31.2 09/23/2022 0657   RDW 13.0 01/27/2023 1209   LYMPHSABS 0.5 (L)  01/27/2023 1209   MONOABS 0.6 09/04/2022 0959   EOSABS 0.1 01/27/2023 1209   BASOSABS 0.0 01/27/2023 1209    BMET    Component Value Date/Time   NA 139 03/31/2023 0808   NA 141 01/27/2023 1209   K 3.7 03/31/2023 0808   CL 100 03/31/2023 0808   CO2 25 01/27/2023 1209   GLUCOSE 109 (H) 03/31/2023 0808   BUN 23 03/31/2023 0808   BUN 15 01/27/2023 1209   CREATININE 2.00 (H) 03/31/2023 0808   CREATININE 3.46 (H) 04/03/2014 1012   CALCIUM 9.7 01/27/2023 1209   GFRNONAA 40 (L) 09/23/2022 0657   GFRAA 7 (L) 12/01/2016 1135    BNP    Component Value Date/Time   BNP 230.1 (H) 07/16/2022 1403    ProBNP    Component Value Date/Time   PROBNP 454 (H) 01/27/2023 1209    Specialty Problems   None   No Known Allergies  Immunization History  Administered Date(s) Administered   Tdap 07/03/2017    Past Medical History:  Diagnosis Date   Abnormal stress test 05/07/2018   Actinic keratoses 05/01/2020   Arthritis    Ascending aorta dilatation (HCC)    46 mm ascending thoracic aorta aneurysm by 02/26/23 TTE   Atrial fibrillation (HCC) 11/28/2016   Atrial fibrillation with RVR (HCC)    Cataract    bilateral- right removed, left present   Cervical radiculopathy 03/01/2020   Cherry angioma 05/01/2020   Chest pain syndrome 01/26/2020   Chronic diastolic CHF (congestive heart failure) (HCC)    Chronic kidney disease, stage V (HCC) 09/18/2014   Complication of anesthesia    problem with bladder not waking up after surgery-for right nephrectomy   Dermatofibroma 05/01/2020   Dry skin 05/01/2020   Dysrhythmia    Elevated hemidiaphragm 02/16/2023   right   Encounter for adequacy testing for dialysis (HCC) 09/18/2014   Encounter for therapeutic drug monitoring 12/05/2016   Enlarged kidney    right   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   Folliculitis 05/01/2020   GERD without esophagitis 12/05/2020   Gout    History of gout 01/30/2020   HLD (hyperlipidemia) 11/28/2016    Hypertension    Hypokalemia 11/28/2016   Hypomagnesemia 01/30/2020   Immunosuppression (HCC) 01/17/2019   Immunosuppressive management encounter following kidney transplant 01/25/2019   Lapband APS July 2015 06/20/2014   Left shoulder pain 06/14/2020   Mixed hyperlipidemia 11/13/2014   Morbid obesity BMI 42 01/04/2014   Multiple benign nevi 05/01/2020   Paroxysmal A-fib (HCC) 04/22/2022   Renal disorder    Renal failure 07/13/2015   Renal insufficiency    KIDNEY DISEASE - FOLLOWED BY DR. Eliott Nine NOTES ON  CHART   Renal insufficiency 04/22/2022   KIDNEY DISEASE - FOLLOWED BY DR. 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   diphenhydramine-acetaminophen (TYLENOL PM) 25-500 MG TABS tablet Take 2 tablets by mouth at bedtime.   ENVARSUS XR 1 MG TB24 Take 5 mg by mouth in the morning.   furosemide (LASIX) 40 MG tablet Take 1 tablet (40 mg total) by mouth daily. Prescribed 01/30/23 Dr Margretta Sidle (nephrology)   magnesium oxide (MAG-OX) 400 MG tablet Take 400 mg by mouth 2 (two) times daily. Lunch and supper    Menthol (BIOFREEZE) 10.5 % AERO Apply 1 spray topically daily as needed (shoulder pain.).   metoprolol succinate (TOPROL-XL) 25 MG 24 hr tablet Take 1 tablet (25 mg total) by mouth 2 (two) times daily.   mycophenolate (MYFORTIC) 180 MG EC tablet Take 540 mg by mouth 2 (two) times daily.   omeprazole (PRILOSEC) 20 MG capsule Take 20 mg by mouth daily as needed (acid reflux).   oxyCODONE-acetaminophen (PERCOCET) 5-325 MG tablet Take 1 tablet by mouth every 4 (four) hours as needed for severe pain.   predniSONE (DELTASONE) 5 MG tablet Take 5 mg by mouth in the morning.   rivaroxaban (XARELTO) 20 MG TABS tablet Take 1 tablet (20 mg total) by mouth daily with supper.   sulfamethoxazole-trimethoprim (BACTRIM) 400-80 MG tablet Take 1 tablet by mouth every Monday, Wednesday, and Friday. In the morning   Facility-Administered Encounter Medications as of 04/02/2023  Medication   sodium chloride flush (NS) 0.9 % injection 3 mL     Review of Systems  Review of Systems  No chest pain with exertion.  No PND.  Comprehensive review of systems otherwise negative. Physical Exam  BP 122/68   Pulse 61   Ht 5\' 10"  (1.778 m)   Wt 258 lb 3.2 oz (117.1 kg)   SpO2 96% Comment: on RA  BMI 37.05 kg/m   Wt Readings from Last 5 Encounters:  04/02/23 258 lb 3.2 oz (117.1 kg)  03/31/23 260 lb (117.9 kg)  03/30/23 260 lb (117.9 kg)  03/11/23 257 lb 12.8 oz (116.9 kg)  02/24/23 255 lb (115.7 kg)    BMI Readings from Last 5 Encounters:  04/02/23 37.05 kg/m  03/31/23 37.31 kg/m  03/30/23 37.31 kg/m  03/11/23 36.99 kg/m  02/24/23 36.59 kg/m     Physical Exam General: Sitting up in chair, no acute distress Eyes: EOMI, no icterus Neck: Supple, no JVP appreciated Pulmonary: Diminished right base, otherwise clear, normal work of breathing Cardiovascular: Regular rate and rhythm, asymmetric edema of the right upper extremity, warm Abdomen: Nondistended, bowel sounds present MSK:  No synovitis, no joint  effusion Neuro: Normal gait, no weakness Psych: Normal mood, full affect   Assessment & Plan:   Dyspnea on exertion: Patient has many reasons for dyspnea including pulmonary hypertension group 2 and 5 given preceding left atrial dilation, mitral valve regurgitation, elevated pulmonary capillary wedge pressure, fistula and high output demonstrated on catheterization.  Hopefully will see some improvement after fistula banding.  He also has evidence of left atrial dilation, mitral valve regurgitation, with normal physiology of exercise and increased blood pressure this will worsen and lead to transient pulmonary venous hypertension which also causes dyspnea.  Lastly, he has chronically right elevated right hemidiaphragm that can cause dyspnea.  However, he reports sudden onset of dyspnea and equivalent of orthopnea immediately following atrial fibrillation ablation.  Reviewed operative report and the left atrium was involved near the pulmonary vein.  Quite possible he has subsequent pulmonary venous stenosis following the procedure.  I have messaged EP and primary cardiologist regarding this possibility and asked for assistance in ongoing workup.  The other etiologies of his dyspnea as discussed above have no clear intervention to make better other than what is already been done.  His pulmonary function test fit with extrathoracic restriction given mild reduction in TLC and severely reduced ERV likely related to right hemidiaphragm paralysis as well as mildly reduced DLCO as can be seen with pulmonary vascular disease, pulmonary hypertension.  Rest of chest imaging is clear.  No further pulmonary workup indicated.   Return if symptoms worsen or fail to improve.   Karren Burly, MD 04/02/2023   This appointment required 65 minutes of patient care (this includes precharting, chart review, review of results, face-to-face care, etc.).

## 2023-04-02 NOTE — Telephone Encounter (Signed)
Spoke with the patient and his wife this afternoon. He is recovering after his recent AV fistula banding surgery. He tells me his breathing continues to slowly improve but is not where he wants it to be.  I discussed his imaging/cath results in detail. Discussed chronic R hemidiaphragm elevation, pulmonary HTN and high output heart failure. Discussed banding procedure and hope that it would help resolve high output heart failure symptoms.  Plan to follow up in clinic in 3 months to check on progress.  Sheria Lang T. Lalla Brothers, MD, [facility], [facility] Cardiac Electrophysiology

## 2023-04-02 NOTE — Anesthesia Postprocedure Evaluation (Signed)
Anesthesia Post Note  Patient: [name] [name]  Procedure(s) Performed: RIGHT ARM ARTERIOVENOUS FISTULA BANDING (Right: Arm Upper)     Patient location during evaluation: PACU Anesthesia Type: General Level of consciousness: awake and alert Pain management: pain level controlled Vital Signs Assessment: post-procedure vital signs reviewed and stable Respiratory status: spontaneous breathing, nonlabored ventilation and respiratory function stable Cardiovascular status: stable and blood pressure returned to baseline Postop Assessment: no apparent nausea or vomiting Anesthetic complications: no   No notable events documented.  Last Vitals:  Vitals:   03/31/23 1203 03/31/23 1215  BP: (!) 178/99 129/81  Pulse: 64 (!) 58  Resp: 18 19  Temp: (!) 36.3 C (!) 36.3 C  SpO2: 93% 92%    Last Pain:  Vitals:   03/31/23 1215  TempSrc:   PainSc: 0-No pain                 [name] [name]

## 2023-04-19 ENCOUNTER — Encounter: Payer: Self-pay | Admitting: Family Medicine

## 2023-04-19 ENCOUNTER — Telehealth: Payer: Self-pay | Admitting: Family Medicine

## 2023-04-19 DIAGNOSIS — J986 Disorders of diaphragm: Secondary | ICD-10-CM | POA: Insufficient documentation

## 2023-04-19 DIAGNOSIS — I719 Aortic aneurysm of unspecified site, without rupture: Secondary | ICD-10-CM | POA: Insufficient documentation

## 2023-04-19 DIAGNOSIS — Z125 Encounter for screening for malignant neoplasm of prostate: Secondary | ICD-10-CM | POA: Insufficient documentation

## 2023-04-19 DIAGNOSIS — E119 Type 2 diabetes mellitus without complications: Secondary | ICD-10-CM | POA: Insufficient documentation

## 2023-04-19 NOTE — Assessment & Plan Note (Signed)
Chronic. Recent magnesium 1.7.  Takes magnesium oxide 400 mg twice daily Defer management to nephrology.

## 2023-04-19 NOTE — Assessment & Plan Note (Signed)
Chronic.  Stable with as needed PPI. Continue omeprazole 20 mg daily as needed. No previous EGD. If worsening can refer to GI for further evaluation.

## 2023-04-19 NOTE — Assessment & Plan Note (Signed)
Chronic.  Recent GFR 40 Currently on Lasix 40 mg daily. My recommendations would be if increased lower extremity edema, weight increase or difficulty breathing could take dose of Lasix.  I have asked him to reach out to Dr. Margretta Sidle for clarification of use of medication.  Until then he is to continue Lasix 40 mg daily as prescribed by nephrology.

## 2023-04-19 NOTE — Assessment & Plan Note (Signed)
Chronic.  Doing well. Continue current immunosuppressive medications as prescribed by transplant team Follow-up with nephro transplant team as scheduled.

## 2023-04-19 NOTE — Assessment & Plan Note (Deleted)
Chronic.  Currently on statin and tolerating well.  No myalgias. Continue atorvastatin 40 mg daily. Check fasting lipids

## 2023-04-19 NOTE — Assessment & Plan Note (Signed)
Chronic.  BMI 37.31.  Currently on chronic steroid therapy. Status post lap band surgery 2016. Encourage healthy diet and exercise.

## 2023-04-19 NOTE — Assessment & Plan Note (Signed)
Chronic.  Stable.   No deficits and asymptomatic. Currently on atorvastatin 40 mg daily Denies tobacco use, EtOH use, THC use.

## 2023-04-19 NOTE — Assessment & Plan Note (Signed)
Chronic.  Status post living donor renal transplant 2020. Doing well since transplant. Currently on immunosuppressive therapy Follows with transplant team and nephrology at [facility].

## 2023-04-19 NOTE — Assessment & Plan Note (Signed)
Chronic. Suspect right diaphragm paralysis, unknown etiology.  Possibly from TEE status post ablation given patient developed symptoms of shortness of breath after procedure. In no acute distress.  Diminished lung sounds at right lower lobe.  A symmetrical chest rise on inspiration left greater than right. Pulmonology for evaluation in AM.

## 2023-04-19 NOTE — Assessment & Plan Note (Signed)
Chronic.  Stable.  Rate controlled. Status post ablation 2024.  Currently on beta-blocker and Xarelto 20 mg daily. Follows with EP and cardiology.

## 2023-04-19 NOTE — Assessment & Plan Note (Signed)
Chronic.  Stable. Status post living donor renal transplant Currently takes mycophenolate 540 mg twice daily, prednisone 5 mg daily, Bactrim 1 tablet Mondays Wednesdays and Fridays and tacrolimus 6 mg daily Managed by nephrology and transplant team at [facility].

## 2023-04-19 NOTE — Assessment & Plan Note (Signed)
Chronic. Status post live donor kidney transplant in 2020. Doing well on current medication. Takes metoprolol XL 25 mg twice daily and Lasix 40 mg daily as needed Follows with nephrology at [facility] and cardiology at [facility]

## 2023-04-19 NOTE — Assessment & Plan Note (Signed)
Chronic.  Ascending aorta aneurysm measuring 46 mm noted on echo. Blood pressure well-controlled on beta-blocker Continue to follow with cardiology

## 2023-04-19 NOTE — Assessment & Plan Note (Signed)
Chronic. Stable and euvolemic on exam. Recent echo shows LVEF 60-65%. Beta-blocker and diuretic therapy Managed by cardiology and nephrology.

## 2023-04-19 NOTE — Assessment & Plan Note (Signed)
New onset.  Recent A1c 6.5.  Not currently on medication. Patient aware of prediabetes.  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 On statin therapy Not currently on ACEi/ARB, given history of renal transplant. Recheck A1c in 3 months Urine ACR Will need diabetic foot exam Annual diabetic eye exam Plan to discuss with patient at next visit.

## 2023-04-19 NOTE — Assessment & Plan Note (Signed)
Chronic.  Currently on statin and tolerating well.  No myalgias. Continue atorvastatin 40 mg daily. Check fasting lipids 

## 2023-04-20 NOTE — Telephone Encounter (Signed)
Called Patient the wife states he is tied up and she is at the doctor that she will call back.

## 2023-04-23 ENCOUNTER — Inpatient Hospital Stay (HOSPITAL_COMMUNITY): Admit: 2023-04-23 | Payer: PPO | Admitting: Cardiology

## 2023-04-23 ENCOUNTER — Encounter (HOSPITAL_COMMUNITY): Payer: PPO

## 2023-04-23 DIAGNOSIS — I4891 Unspecified atrial fibrillation: Secondary | ICD-10-CM

## 2023-04-23 SURGERY — LEFT ATRIAL APPENDAGE OCCLUSION
Anesthesia: General

## 2023-04-24 ENCOUNTER — Encounter: Payer: Self-pay | Admitting: *Deleted

## 2023-04-24 NOTE — Telephone Encounter (Signed)
Pt has not called back to schedule appts-sent mychart message

## 2023-04-28 NOTE — Progress Notes (Deleted)
POST OPERATIVE OFFICE NOTE    CC:  F/u for surgery  HPI:  This is a 62 y.o. male who is s/p banding of right arm AVF around 6mm balloon on 03/31/2023 by Dr. Randie Heinz   He has hx of right RC AVF 10/27/2014 by Dr. Imogene Burn.He then underwent right BC AVF on 08/22/2015 by Dr. Imogene Burn.  He was seen in April and at that time, he was having high output heart failure that was followed by cardiology and was sent by evaluation of his fistula.  He was s/p renal transplant from a living donor about 4 years ago.    His fistula was quite large with very high flow.  His options included ligation or banding.  Pt states he does *** have pain/numbness in the *** hand.    The pt *** on dialysis *** at *** location.   No Known Allergies  Current Outpatient Medications  Medication Sig Dispense Refill   atorvastatin (LIPITOR) 40 MG tablet Take 1 tablet (40 mg total) by mouth daily. 90 tablet 1   diphenhydramine-acetaminophen (TYLENOL PM) 25-500 MG TABS tablet Take 2 tablets by mouth at bedtime.     ENVARSUS XR 1 MG TB24 Take 5 mg by mouth in the morning.     furosemide (LASIX) 40 MG tablet Take 1 tablet (40 mg total) by mouth daily. Prescribed 01/30/23 Dr Margretta Sidle (nephrology)     magnesium oxide (MAG-OX) 400 MG tablet Take 400 mg by mouth 2 (two) times daily. Lunch and supper     Menthol (BIOFREEZE) 10.5 % AERO Apply 1 spray topically daily as needed (shoulder pain.).     metoprolol succinate (TOPROL-XL) 25 MG 24 hr tablet Take 1 tablet (25 mg total) by mouth 2 (two) times daily. 60 tablet 3   mycophenolate (MYFORTIC) 180 MG EC tablet Take 540 mg by mouth 2 (two) times daily.     omeprazole (PRILOSEC) 20 MG capsule Take 20 mg by mouth daily as needed (acid reflux).     oxyCODONE-acetaminophen (PERCOCET) 5-325 MG tablet Take 1 tablet by mouth every 4 (four) hours as needed for severe pain. 20 tablet 0   predniSONE (DELTASONE) 5 MG tablet Take 5 mg by mouth in the morning.     rivaroxaban (XARELTO) 20 MG TABS tablet Take  1 tablet (20 mg total) by mouth daily with supper. 90 tablet 3   sulfamethoxazole-trimethoprim (BACTRIM) 400-80 MG tablet Take 1 tablet by mouth every Monday, Wednesday, and Friday. In the morning     Current Facility-Administered Medications  Medication Dose Route Frequency Provider Last Rate Last Admin   sodium chloride flush (NS) 0.9 % injection 3 mL  3 mL Intravenous Q12H Wendall Stade, MD         ROS:  See HPI  Physical Exam:  ***  Incision:  *** Extremities:   There *** a palpable *** pulse.   Motor and sensory *** in tact.   There *** a thrill/bruit present.  Access is *** easily palpable     Assessment/Plan:  This is a 61 y.o. male who is s/p: banding of right arm AVF around 6mm balloon on 03/31/2023 by Dr. Randie Heinz   -the pt does *** have evidence of steal. -pt's access can be used ***. -if pt has tunneled catheter, this can be removed at the discretion of the dialysis center once the pt's access has been successfully cannulated to their satisfaction.  -discussed with pt that access does not last forever and will need intervention or even  new access at some point.  -the pt will follow up ***   Doreatha Massed, [facility] [phone]  Clinic MD:  Randie Heinz

## 2023-05-07 ENCOUNTER — Other Ambulatory Visit: Payer: Self-pay | Admitting: Physician Assistant

## 2023-05-07 ENCOUNTER — Other Ambulatory Visit (HOSPITAL_COMMUNITY): Payer: Self-pay

## 2023-05-07 DIAGNOSIS — I4819 Other persistent atrial fibrillation: Secondary | ICD-10-CM

## 2023-05-07 NOTE — Telephone Encounter (Signed)
Please review

## 2023-05-07 NOTE — Telephone Encounter (Signed)
Prescription refill request for Xarelto received.  Indication: Afib  Last office visit: 01/27/23 Eden Emms)  Weight: 117.1kg Age: 62 Scr: 2.0 (03/31/23)  CrCl: 61.74ml/min  Appropriate dose. Refill sent.

## 2023-05-19 ENCOUNTER — Telehealth: Payer: Self-pay | Admitting: Family Medicine

## 2023-05-19 DIAGNOSIS — Z1283 Encounter for screening for malignant neoplasm of skin: Secondary | ICD-10-CM

## 2023-05-19 NOTE — Telephone Encounter (Signed)
Patient is requesting a referral to dermatology. Patient has cancer and some of the medication he is on can cause skin cancer, and he would like to be check out by a dermatologist.

## 2023-05-20 NOTE — Telephone Encounter (Signed)
Ok to refer to [place] [facility] in [place]?

## 2023-05-20 NOTE — Telephone Encounter (Signed)
Patient was schedule with [facility] yesterday. He went to check in and the front office told him he did not have an appointment. Next available appointment is 1 year out. Patient's wife said they love the doctor but the front office staff are rude. They prefer another office anywhere between [place] and [place].

## 2023-05-20 NOTE — Telephone Encounter (Signed)
Referral placed.

## 2023-05-20 NOTE — Addendum Note (Signed)
Addended by: Rita Ohara D on: 05/20/2023 05:06 PM   Modules accepted: Orders

## 2023-05-20 NOTE — Telephone Encounter (Signed)
LM for patient. Per chart had appt with [facility] yesterday. If patient returns call, can let us know what/if anything is needed.

## 2023-05-21 NOTE — Telephone Encounter (Signed)
Left detailed message for patient.

## 2023-05-25 ENCOUNTER — Telehealth: Payer: Self-pay

## 2023-05-25 NOTE — Telephone Encounter (Signed)
Caller: Patient's wife, Thayer Ohm  Concern: despite the surgery, pt's AVF is continuing to grow, skin looks pink as if thinning  Location: right arm  Procedure: Dialysis Access Surgery  Resolution: Appointment scheduled for first available  Next Appt: Appointment scheduled for 6/26 @ 1500.

## 2023-05-26 NOTE — Progress Notes (Signed)
Electrophysiology Office Follow up Visit Note:    Date:  06/02/2023   ID:  [name] [name], DOB Feb 02, 1961, MRN [id number]  PCP:  Dana Allan, MD  [facility] Cardiologist:  None  [facility] Electrophysiologist:  Lanier Prude, MD    Interval History:    [name] [name] is a 62 y.o. male patient of [name] [name] and [name] [name] Added on to DOD schedule for dyspnea   He had an AF ablation 09/25/2022. During the ablation, the veins were isolated.Did not have CT PV prior the appendage was cleared with TEE   He has had dyspnea since his ablation TEE done 09/25/22 with EF 60-65% Normal RV trivial MR CXR has shown elevation of right hemidiaphragm and CE. Hct was 41.2 10/31/22 Past BNP's in 2023 only mildly elevated in 230 range  He is on chronic anticoagulation with Xarelto  He is post renal transplant s/p LURKT x 2 01/13/19 for solitary kidney on home HD since June 2017 BP has been well controlled Follows with [name] [place] Immunosuppression with prednisone Myfortic and envarsus XR The latter can be associated with dyspnea in 29% of patients Baseline Cr is 1.86   He has had a clear change in his breathing since ablation. He is non smoker Review of his xrays shows significantly elevated right hemidiaphragm but this appears chronic since 2015.  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 Discussing with Dr Randie Heinz to tie it off. Also needs to discuss with Dr [name] [name] stopping DOAC or consideration for watchman I discussed doing non contrast chest CT as a preliminary way to look at PV stenosis. 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 by 02/26/23 TTE   Atrial fibrillation (HCC) 11/28/2016   Atrial fibrillation (HCC) 11/28/2016   Atrial fibrillation with RVR (HCC)    Atrial fibrillation with RVR (HCC)    Cataract    bilateral- right removed, left present   Cervical radiculopathy 03/01/2020   Cherry angioma 05/01/2020   Chest pain syndrome 01/26/2020   Chest pain syndrome 01/26/2020   Chronic diastolic CHF (congestive heart failure) (HCC)    Chronic kidney disease, stage V (HCC) 09/18/2014   Complication of anesthesia    problem with bladder not waking up after surgery-for right nephrectomy   Dermatofibroma 05/01/2020   Dry skin 05/01/2020   Dysrhythmia    Elevated hemidiaphragm 02/16/2023   right   Encounter for adequacy testing for dialysis [facility]) 09/18/2014   Encounter for adequacy testing for dialysis (HCC) 09/18/2014   Encounter for therapeutic drug monitoring 12/05/2016   Enlarged kidney    right   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   Folliculitis 05/01/2020   GERD without esophagitis 12/05/2020   Gout    History of gout 01/30/2020   HLD (  hyperlipidemia) 11/28/2016   Hypertension    Hypokalemia 11/28/2016   Hypomagnesemia 01/30/2020   Immunosuppression (HCC) 01/17/2019   Immunosuppressive management encounter following kidney transplant 01/25/2019   Immunosuppressive management encounter following kidney transplant 01/25/2019   Lapband APS July 2015 06/20/2014   Left shoulder pain 06/14/2020   Mixed hyperlipidemia 11/13/2014   Mixed hyperlipidemia 11/13/2014   Morbid obesity BMI 42 01/04/2014   Multiple benign nevi 05/01/2020   Paroxysmal A-fib (HCC) 04/22/2022   Renal disorder    Renal failure 07/13/2015   Renal failure 07/13/2015   Renal  insufficiency    KIDNEY DISEASE - FOLLOWED BY DR. Eliott Nine NOTES ON CHART   Renal insufficiency 04/22/2022   KIDNEY DISEASE - FOLLOWED BY DR. 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 12/24/2018   Serum total bilirubin elevated 01/30/2020   Skin tag 05/01/2020   Stage 3b chronic kidney disease (HCC) 01/30/2020   Stage 3b chronic kidney disease (HCC) 01/30/2020   Status post gastric banding 06/20/2014   Status post gastric banding 06/20/2014   Status post laparoscopic cholecystectomy August 2016 07/13/2015   Status post living-donor kidney transplantation 01/17/2019   Sun-damaged skin 05/01/2020   TIA (transient ischemic attack) 07/14/2017   Vertigo     Past Surgical History:  Procedure Laterality Date   ATRIAL FIBRILLATION ABLATION N/A 09/25/2022   Procedure: ATRIAL FIBRILLATION ABLATION;  Surgeon: Lanier Prude, MD;  Location: [facility] INVASIVE CV LAB;  Service: Cardiovascular;  Laterality: N/A;   AV FISTULA PLACEMENT     "IT CLOSED UP AND DOES NOT WORK"   AV FISTULA PLACEMENT Right 10/27/2014   Procedure: ARTERIOVENOUS (AV) FISTULA CREATION;  Surgeon: Fransisco Hertz, MD;  Location: [facility] OR;  Service: Vascular;  Laterality: Right;   AV FISTULA PLACEMENT Right 08/22/2015   Procedure: BRACHIOCEPHALIC ARTERIOVENOUS (AV) FISTULA CREATION;  Surgeon: Fransisco Hertz, MD;  Location: [facility] OR;  Service: Vascular;  Laterality: Right;   CATARACTS REMOVED Right    CHOLECYSTECTOMY N/A 07/13/2015   Procedure: LAPAROSCOPIC CHOLECYSTECTOMY WITH INTRAOPERATIVE CHOLANGIOGRAM;  Surgeon: Luretha Murphy, MD;  Location: [facility] ORS;  Service: General;  Laterality: N/A;   EYE SURGERY     left cataract surgery-lens implant   HERNIA REPAIR     INSERTION OF MESH N/A 06/20/2014   Procedure: INSERTION OF MESH;  Surgeon: Valarie Merino, MD;   Location: [facility] ORS;  Service: General;  Laterality: N/A;   KNEE SURGERY  2001   LAPAROSCOPIC GASTRIC BANDING N/A 06/20/2014   Procedure: LAPAROSCOPIC GASTRIC BANDING and VENTRAL HERNIA REPAIR WITH MESH;  Surgeon: Valarie Merino, MD;  Location: [facility] ORS;  Service: General;  Laterality: N/A;   REVISON OF ARTERIOVENOUS FISTULA Right 03/31/2023   Procedure: RIGHT ARM ARTERIOVENOUS FISTULA BANDING;  Surgeon: Maeola Harman, MD;  Location: [facility] OR;  Service: Vascular;  Laterality: Right;   RIGHT HEART CATH N/A 02/24/2023   Procedure: RIGHT HEART CATH;  Surgeon: Dorthula Nettles, DO;  Location: [facility] INVASIVE CV LAB;  Service: Cardiovascular;  Laterality: N/A;   TEE WITHOUT CARDIOVERSION N/A 09/25/2022   Procedure: TRANSESOPHAGEAL ECHOCARDIOGRAM (TEE);  Surgeon: Lanier Prude, MD;  Location: [facility] INVASIVE CV LAB;  Service: Cardiovascular;  Laterality: N/A;   TOTAL NEPHRECTOMY Right     Current Medications: Current Meds  Medication Sig   atorvastatin (LIPITOR) 40 MG tablet Take 1 tablet (40 mg  total) by mouth daily.   diphenhydramine-acetaminophen (TYLENOL PM) 25-500 MG TABS tablet Take 2 tablets by mouth at bedtime.   ENVARSUS XR 1 MG TB24 Take 5 mg by mouth in the morning.   furosemide (LASIX) 40 MG tablet Take 20 mg by mouth daily. Prescribed 01/30/23 Dr Margretta Sidle (nephrology)   magnesium oxide (MAG-OX) 400 MG tablet Take 400 mg by mouth 2 (two) times daily. Lunch and supper   Menthol (BIOFREEZE) 10.5 % AERO Apply 1 spray topically daily as needed (shoulder pain.).   metoprolol succinate (TOPROL-XL) 25 MG 24 hr tablet Take 1 tablet (25 mg total) by mouth 2 (two) times daily.   mycophenolate (MYFORTIC) 180 MG EC tablet Take 540 mg by mouth 2 (two) times daily.   omeprazole (PRILOSEC) 20 MG capsule Take 20 mg by mouth daily as needed (acid reflux).   predniSONE (DELTASONE) 5 MG tablet Take 5 mg by mouth in the morning.   sulfamethoxazole-trimethoprim (BACTRIM) 400-80 MG tablet Take 1 tablet by  mouth every Monday, Wednesday, and Friday. In the morning   XARELTO 20 MG TABS tablet Take 1 tablet (20 mg total) by mouth daily with supper.   Current Facility-Administered Medications for the 06/02/23 encounter (Office Visit) with Wendall Stade, MD  Medication   sodium chloride flush (NS) 0.9 % injection 3 mL     Allergies:   Patient has no known allergies.   Social History   Socioeconomic History   Marital status: Married    Spouse name: Not on file   Number of children: 12   Years of education: 2   Highest education level: Not on file  Occupational History   Occupation: Retired  Tobacco Use   Smoking status: Never    Passive exposure: Past   Smokeless tobacco: Never  Vaping Use   Vaping Use: Never used  Substance and Sexual Activity   Alcohol use: No    Alcohol/week: 0.0 standard drinks of alcohol   Drug use: No   Sexual activity: Not on file  Other Topics Concern   Not on file  Social History Narrative   Lives at home with his wife.   Right-handed.   No caffeine use.   Social Determinants of Health   Financial Resource Strain: Not on file  Food Insecurity: Not on file  Transportation Needs: Not on file  Physical Activity: Not on file  Stress: Not on file  Social Connections: Not on file     Family History: The patient's family history includes Alcohol abuse in his sister; Arthritis in his sister; CAD in an other family member; Cancer in his father, sister, and sister; Dementia in his mother; Depression in his sister; Drug abuse in his sister; Early death in his father; Heart disease in his mother; Hyperlipidemia in his mother; Hypertension in his mother. There is no history of Colon cancer, Rectal cancer, or Stomach cancer.  ROS:   Please see the history of present illness.    (+)positional SOB with lying down (+) palpitations All other systems reviewed and are negative.  EKGs/Labs/Other Studies Reviewed:    The following studies were reviewed  today:   EKG: EKG is personally reviewed. 12/29/22: Sinus rhythm  Recent Labs: 07/16/2022: B Natriuretic Peptide 230.1 09/23/2022: Magnesium 1.7 01/27/2023: NT-Pro BNP 454; Platelets 156; TSH 1.170 03/31/2023: BUN 23; Creatinine, Ser 2.00; Hemoglobin 15.0; Potassium 3.7; Sodium 139  Recent Lipid Panel    Component Value Date/Time   CHOL 187 11/20/2021 0809   TRIG 178 (H) 11/20/2021  0809   HDL 38 (L) 11/20/2021 0809   CHOLHDL 4.9 11/20/2021 0809   CHOLHDL 3.1 11/28/2016 0346   VLDL 11 11/28/2016 0346   LDLCALC 117 (H) 11/20/2021 0809    Physical Exam:    VS:  BP 128/74   Pulse (!) 56   Ht 5\' 10"  (1.778 m)   Wt 260 lb (117.9 kg)   SpO2 98%   BMI 37.31 kg/m     Wt Readings from Last 3 Encounters:  06/02/23 260 lb (117.9 kg)  05/27/23 262 lb 1.6 oz (118.9 kg)  04/02/23 258 lb 3.2 oz (117.1 kg)     Affect appropriate Healthy:  appears stated age HEENT: normal Neck supple with no adenopathy JVP normal no bruits no thyromegaly Lungs clear with no wheezing Decreased BS right base  Heart:  S1/S2 continuous shunt murmur from fistula  no rub, gallop or click PMI normal Abdomen: benighn, post renal transplant  Distal pulses intact with no bruits No edema Neuro non-focal Skin warm and dry LUE very large fistula with thrill    PLAN:    In order of problems listed above:  PAF:  post ablation 09/25/22. Maintaining NSR on xarelto with CHADVASC 4 Consideration for Watchman due to patient wishes not to be on long term anticoagulation and steroid use. Risk of PV stenosis post ablation quite low and need to avoid CTA with contrast due to renal transplant and elevated Cr will check non contrast CT to take a rudimentary look at PV's F/U [name] [name] to discuss DOAC and possible CTA PV/Watchman  2.   Dyspnea; differential is long See above PV stenosis post ablation. Elevated right :       Hemidiaphragm, diastolic dysfunction Side effect from anti rejection medication Envarsus         Fistula ligation for high output CHF not sufficient CO likely still very high F/U [facility] to tie off. If he has         Renal failure in future would need femoral access   3.   CRF : post transplant Cr 2.0  f/u with [facility]rology [facility] consider changing Envarsus    Non contrast chest CT  F/U with Dr [name] [name] this month and me in 6 months Has f/u with Dr Randie Heinz [facility]

## 2023-05-27 ENCOUNTER — Ambulatory Visit (INDEPENDENT_AMBULATORY_CARE_PROVIDER_SITE_OTHER): Payer: PPO | Admitting: Physician Assistant

## 2023-05-27 VITALS — BP 140/86 | HR 61 | Temp 98.4°F | Resp 18 | Ht 70.0 in | Wt 262.1 lb

## 2023-05-27 DIAGNOSIS — I5083 High output heart failure: Secondary | ICD-10-CM

## 2023-05-27 NOTE — Progress Notes (Signed)
Established Dialysis Access   History of Present Illness   [name] [name] is a 62 y.o. 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   He is s/p banding of right arm AV fistula with 6 mm balloon by Dr. Randie Heinz on 03/31/23. Patient called because he and his wife feel that aneurysm is till increasing in size with some thinning of the overlying skin. Has a functioning renal transplant.   Current Outpatient Medications  Medication Sig Dispense Refill   atorvastatin (LIPITOR) 40 MG tablet Take 1 tablet (40 mg total) by mouth daily. 90 tablet 1   diphenhydramine-acetaminophen (TYLENOL PM) 25-500 MG TABS tablet Take 2 tablets by mouth at bedtime.     ENVARSUS XR 1 MG TB24 Take 5 mg by mouth in the morning.     furosemide (LASIX) 40 MG tablet Take 1 tablet (40 mg total) by mouth daily. Prescribed 01/30/23 Dr Margretta Sidle (nephrology)     magnesium oxide (MAG-OX) 400 MG tablet Take 400 mg by mouth 2 (two) times daily. Lunch and supper     Menthol (BIOFREEZE) 10.5 % AERO Apply 1 spray topically daily as needed (shoulder pain.).     metoprolol succinate (TOPROL-XL) 25 MG 24 hr tablet Take 1 tablet (25 mg total) by mouth 2 (two) times daily. 60 tablet 3   mycophenolate (MYFORTIC) 180 MG EC tablet Take 540 mg by mouth 2 (two) times daily.     omeprazole (PRILOSEC) 20 MG capsule Take 20 mg by mouth daily as needed (acid reflux).     oxyCODONE-acetaminophen (PERCOCET) 5-325 MG tablet Take 1 tablet by mouth every 4 (four) hours as needed for severe pain. 20 tablet 0   predniSONE (DELTASONE) 5 MG tablet Take 5 mg by mouth in the morning.     sulfamethoxazole-trimethoprim (BACTRIM) 400-80 MG tablet Take 1 tablet by mouth every Monday, Wednesday, and Friday. In the morning     XARELTO 20 MG TABS tablet Take 1 tablet (20 mg total) by mouth daily with supper. 90 tablet 3   Current Facility-Administered Medications  Medication Dose Route Frequency Provider Last Rate Last Admin   sodium  chloride flush (NS) 0.9 % injection 3 mL  3 mL Intravenous Q12H Wendall Stade, MD        On ROS today: negative unless stated in HPI   Physical Examination   Vitals:   05/27/23 1436  BP: (!) 140/86  Pulse: 61  Resp: 18  Temp: 98.4 F (36.9 C)  TempSrc: Temporal  SpO2: 96%  Weight: 262 lb 1.6 oz (118.9 kg)  Height: 5\' 10"  (1.778 m)   Body mass index is 37.61 kg/m.  General Well appearing, well nourished  Pulmonary Non labored  Cardiac Regular rate  Vascular 2+ radial pulses bilaterally. 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 He is s/p banding of right arm AV fistula with 6 mm balloon by Dr. Randie Heinz on 03/31/23. Fistula continues to be very aneurysmal throughout. Re discussed that often banding is ineffective especially with a fistula as diffusely aneurysmal as his and often times if you constrict it too much it will cause fistula to occlude. Discussed that best option would be to ligate the fistula.  He has a functioning renal transplant. I also discussed patient with Dr Randie Heinz, who agreed that this would be next best step. Patient would like to follow up with Cardiologist again prior to making a final decision He will call if they would like to schedule ligation.   Graceann Congress, PA-C [facility] Office: [phone]  Clinic MD: Dr. Randie Heinz

## 2023-06-02 ENCOUNTER — Ambulatory Visit: Payer: PPO | Attending: Cardiovascular Disease | Admitting: Cardiovascular Disease

## 2023-06-02 ENCOUNTER — Encounter: Payer: Self-pay | Admitting: Cardiovascular Disease

## 2023-06-02 VITALS — BP 128/74 | HR 56 | Ht 70.0 in | Wt 260.0 lb

## 2023-06-02 DIAGNOSIS — I5083 High output heart failure: Secondary | ICD-10-CM

## 2023-06-02 DIAGNOSIS — Z940 Kidney transplant status: Secondary | ICD-10-CM

## 2023-06-02 DIAGNOSIS — I370 Nonrheumatic pulmonary valve stenosis: Secondary | ICD-10-CM | POA: Diagnosis not present

## 2023-06-02 DIAGNOSIS — I4891 Unspecified atrial fibrillation: Secondary | ICD-10-CM

## 2023-06-02 DIAGNOSIS — R0600 Dyspnea, unspecified: Secondary | ICD-10-CM | POA: Diagnosis not present

## 2023-06-02 DIAGNOSIS — R0689 Other abnormalities of breathing: Secondary | ICD-10-CM

## 2023-06-02 NOTE — Patient Instructions (Addendum)
Medication Instructions:  Your physician recommends that you continue on your current medications as directed. Please refer to the Current Medication list given to you today.  *If you need a refill on your cardiac medications before your next appointment, please call your pharmacy*  Lab Work: If you have labs (blood work) drawn today and your tests are completely normal, you will receive your results only by: MyChart Message (if you have MyChart) OR A paper copy in the mail If you have any lab test that is abnormal or we need to change your treatment, we will call you to review the results.  Testing/Procedures: Chest CT scanning, (CAT scanning), is a noninvasive, special x-ray that produces cross-sectional images of the body using x-rays and a computer. CT scans help physicians diagnose and treat medical conditions. For some CT exams, a contrast material is used to enhance visibility in the area of the body being studied. CT scans provide greater clarity and reveal more details than regular x-ray exams.  Follow-Up: At [facility], you and your health needs are our priority.  As part of our continuing mission to provide you with exceptional heart care, we have created designated Provider Care Teams.  These Care Teams include your primary Cardiologist (physician) and Advanced Practice Providers (APPs -  Physician Assistants and Nurse Practitioners) who all work together to provide you with the care you need, when you need it.  We recommend signing up for the patient portal called "MyChart".  Sign up information is provided on this After Visit Summary.  MyChart is used to connect with patients for Virtual Visits (Telemedicine).  Patients are able to view lab/test results, encounter notes, upcoming appointments, etc.  Non-urgent messages can be sent to your provider as well.   To learn more about what you can do with MyChart, go to [email or web address].    Your next appointment:   6  months  Provider:   Charlton Haws, MD

## 2023-06-11 ENCOUNTER — Ambulatory Visit (HOSPITAL_COMMUNITY)
Admission: RE | Admit: 2023-06-11 | Discharge: 2023-06-11 | Disposition: A | Discharge status: Home / Self Care | Payer: PPO | Source: Ambulatory Visit | Attending: Cardiovascular Disease | Admitting: Cardiovascular Disease

## 2023-06-11 DIAGNOSIS — I370 Nonrheumatic pulmonary valve stenosis: Secondary | ICD-10-CM | POA: Insufficient documentation

## 2023-06-11 DIAGNOSIS — I4819 Other persistent atrial fibrillation: Secondary | ICD-10-CM

## 2023-06-11 DIAGNOSIS — I700 Atherosclerosis of aorta: Secondary | ICD-10-CM | POA: Insufficient documentation

## 2023-06-11 DIAGNOSIS — R0600 Dyspnea, unspecified: Secondary | ICD-10-CM

## 2023-06-16 ENCOUNTER — Telehealth: Payer: Self-pay

## 2023-06-16 NOTE — Telephone Encounter (Signed)
Patient and wife returned call. Informed them both of surgery recommendations as discussed per last office visit and PA on today. Patient's wife reports a "new enlarged growth" at area previously operated and is painful for patient to bend his arm. Reports this has changed since last office visit in June. Patient verbalized he would like his arm re-evaluated. Scheduled patient to follow up with PA on 7/17 at 1:15pm. Both voiced understanding and appreciation for appt. Corrina B., PA-C made aware.

## 2023-06-16 NOTE — Telephone Encounter (Signed)
Received VM from patient's wife Thayer Ohm, requesting to schedule "removal of right arm fistula" and inquired if enlarged aneurysmal area would go away.   Spoke with Corrina B., PA-C who advised a ligation of fistula with Dr. Randie Heinz was discussed at office visit. If patient wants to remove aneurysm, the surgery would be more involved and leave a longer incision.    Left VM for patient/wife to return call.

## 2023-06-17 ENCOUNTER — Ambulatory Visit (INDEPENDENT_AMBULATORY_CARE_PROVIDER_SITE_OTHER): Payer: PPO | Admitting: Physician Assistant

## 2023-06-17 ENCOUNTER — Encounter: Payer: Self-pay | Admitting: Physician Assistant

## 2023-06-17 VITALS — BP 154/92 | HR 64 | Temp 98.6°F | Wt 259.0 lb

## 2023-06-17 DIAGNOSIS — T827XXA Infection and inflammatory reaction due to other cardiac and vascular devices, implants and grafts, initial encounter: Secondary | ICD-10-CM

## 2023-06-17 DIAGNOSIS — I5083 High output heart failure: Secondary | ICD-10-CM

## 2023-06-17 MED ORDER — CEPHALEXIN 500 MG PO CAPS: 500.0000 mg | ORAL_CAPSULE | Freq: Two times a day (BID) | ORAL | 0 refills | Status: DC

## 2023-06-17 NOTE — H&P (View-Only) (Signed)
Office Note     CC:  follow up Requesting Provider:  Dana Allan, MD  HPI: [name] [name] is a 62 y.o. 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  He is s/p banding of right arm AV fistula with 6 mm balloon by Dr. Randie Heinz on 03/31/23. Patient called because he and his wife feel that aneurysm is till increasing in size with some thinning of the overlying skin with redness. He says this started to appear over the past couple days. He denies any fever or chills.  He saw his Cardiologist since his last visit who recommends ligation of the fistula due to his high output heart failure. Has a functioning renal transplant.   Past Medical History:  Diagnosis Date   Abnormal stress test 05/07/2018   Actinic keratoses 05/01/2020   Arthritis    Ascending aorta dilatation (HCC)    46 mm ascending thoracic aorta aneurysm by 02/26/23 TTE   Atrial fibrillation (HCC) 11/28/2016   Atrial fibrillation (HCC) 11/28/2016   Atrial fibrillation with RVR (HCC)    Atrial fibrillation with RVR (HCC)    Cataract    bilateral- right removed, left present   Cervical radiculopathy 03/01/2020   Cherry angioma 05/01/2020   Chest pain syndrome 01/26/2020   Chest pain syndrome 01/26/2020   Chronic diastolic CHF (congestive heart failure) (HCC)    Chronic kidney disease, stage V (HCC) 09/18/2014   Complication of anesthesia    problem with bladder not waking up after surgery-for right nephrectomy   Dermatofibroma 05/01/2020   Dry skin 05/01/2020   Dysrhythmia    Elevated hemidiaphragm 02/16/2023   right   Encounter for adequacy testing for dialysis [facility]) 09/18/2014   Encounter for adequacy testing for dialysis (HCC) 09/18/2014   Encounter for therapeutic drug monitoring 12/05/2016   Enlarged kidney    right   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   Folliculitis 05/01/2020   GERD without esophagitis 12/05/2020   Gout    History of gout 01/30/2020   HLD  (hyperlipidemia) 11/28/2016   Hypertension    Hypokalemia 11/28/2016   Hypomagnesemia 01/30/2020   Immunosuppression (HCC) 01/17/2019   Immunosuppressive management encounter following kidney transplant 01/25/2019   Immunosuppressive management encounter following kidney transplant 01/25/2019   Lapband APS July 2015 06/20/2014   Left shoulder pain 06/14/2020   Mixed hyperlipidemia 11/13/2014   Mixed hyperlipidemia 11/13/2014   Morbid obesity BMI 42 01/04/2014   Multiple benign nevi 05/01/2020   Paroxysmal A-fib (HCC) 04/22/2022   Renal disorder    Renal failure 07/13/2015   Renal failure 07/13/2015   Renal insufficiency    KIDNEY DISEASE - FOLLOWED BY DR. Eliott Nine NOTES ON CHART   Renal insufficiency 04/22/2022   KIDNEY DISEASE - FOLLOWED BY DR. 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 12/24/2018   Serum total bilirubin elevated 01/30/2020   Skin tag 05/01/2020   Stage 3b chronic kidney disease (HCC) 01/30/2020   Stage 3b chronic kidney disease (HCC) 01/30/2020   Status post gastric banding 06/20/2014   Status post gastric banding 06/20/2014   Status post laparoscopic cholecystectomy August 2016 07/13/2015   Status post living-donor kidney transplantation 01/17/2019   Sun-damaged skin 05/01/2020   TIA (transient ischemic attack) 07/14/2017   Vertigo     Past Surgical History:  Procedure Laterality Date  ATRIAL FIBRILLATION ABLATION N/A 09/25/2022   Procedure: ATRIAL FIBRILLATION ABLATION;  Surgeon: Lanier Prude, MD;  Location: [facility] INVASIVE CV LAB;  Service: Cardiovascular;  Laterality: N/A;   AV FISTULA PLACEMENT     "IT CLOSED UP AND DOES NOT WORK"   AV FISTULA PLACEMENT Right 10/27/2014   Procedure: ARTERIOVENOUS (AV) FISTULA CREATION;  Surgeon: Fransisco Hertz, MD;  Location: [facility] OR;  Service:  Vascular;  Laterality: Right;   AV FISTULA PLACEMENT Right 08/22/2015   Procedure: BRACHIOCEPHALIC ARTERIOVENOUS (AV) FISTULA CREATION;  Surgeon: Fransisco Hertz, MD;  Location: [facility] OR;  Service: Vascular;  Laterality: Right;   CATARACTS REMOVED Right    CHOLECYSTECTOMY N/A 07/13/2015   Procedure: LAPAROSCOPIC CHOLECYSTECTOMY WITH INTRAOPERATIVE CHOLANGIOGRAM;  Surgeon: Luretha Murphy, MD;  Location: [facility] ORS;  Service: General;  Laterality: N/A;   EYE SURGERY     left cataract surgery-lens implant   HERNIA REPAIR     INSERTION OF MESH N/A 06/20/2014   Procedure: INSERTION OF MESH;  Surgeon: Valarie Merino, MD;  Location: [facility] ORS;  Service: General;  Laterality: N/A;   KNEE SURGERY  2001   LAPAROSCOPIC GASTRIC BANDING N/A 06/20/2014   Procedure: LAPAROSCOPIC GASTRIC BANDING and VENTRAL HERNIA REPAIR WITH MESH;  Surgeon: Valarie Merino, MD;  Location: [facility] ORS;  Service: General;  Laterality: N/A;   REVISON OF ARTERIOVENOUS FISTULA Right 03/31/2023   Procedure: RIGHT ARM ARTERIOVENOUS FISTULA BANDING;  Surgeon: Maeola Harman, MD;  Location: [facility] OR;  Service: Vascular;  Laterality: Right;   RIGHT HEART CATH N/A 02/24/2023   Procedure: RIGHT HEART CATH;  Surgeon: Dorthula Nettles, DO;  Location: [facility] INVASIVE CV LAB;  Service: Cardiovascular;  Laterality: N/A;   TEE WITHOUT CARDIOVERSION N/A 09/25/2022   Procedure: TRANSESOPHAGEAL ECHOCARDIOGRAM (TEE);  Surgeon: Lanier Prude, MD;  Location: [facility] INVASIVE CV LAB;  Service: Cardiovascular;  Laterality: N/A;   TOTAL NEPHRECTOMY Right     Social History   Socioeconomic History   Marital status: Married    Spouse name: Not on file   Number of children: 12   Years of education: 2   Highest education level: Not on file  Occupational History   Occupation: Retired  Tobacco Use   Smoking status: Never    Passive exposure: Past   Smokeless tobacco: Never  Vaping Use   Vaping status: Never Used  Substance and Sexual Activity   Alcohol  use: No    Alcohol/week: 0.0 standard drinks of alcohol   Drug use: No   Sexual activity: Not on file  Other Topics Concern   Not on file  Social History Narrative   Lives at home with his wife.   Right-handed.   No caffeine use.   Social Determinants of Health   Financial Resource Strain: Not on file  Food Insecurity: Not on file  Transportation Needs: Not on file  Physical Activity: Not on file  Stress: Not on file  Social Connections: Not on file  Intimate Partner Violence: Not on file    Family History  Problem Relation Age of Onset   Hyperlipidemia Mother    Hypertension Mother    Heart disease Mother        before age 47   Dementia Mother    Early death Father    Cancer Father        bone   Arthritis Sister    Cancer Sister        breast   Drug abuse Sister  Depression Sister    Alcohol abuse Sister    Cancer Sister        breast   CAD Other    Colon cancer Neg Hx    Rectal cancer Neg Hx    Stomach cancer Neg Hx     Current Outpatient Medications  Medication Sig Dispense Refill   atorvastatin (LIPITOR) 40 MG tablet Take 1 tablet (40 mg total) by mouth daily. 90 tablet 1   diphenhydramine-acetaminophen (TYLENOL PM) 25-500 MG TABS tablet Take 2 tablets by mouth at bedtime.     ENVARSUS XR 1 MG TB24 Take 5 mg by mouth in the morning.     furosemide (LASIX) 40 MG tablet Take 20 mg by mouth daily. Prescribed 01/30/23 Dr Margretta Sidle (nephrology)     magnesium oxide (MAG-OX) 400 MG tablet Take 400 mg by mouth 2 (two) times daily. Lunch and supper     Menthol (BIOFREEZE) 10.5 % AERO Apply 1 spray topically daily as needed (shoulder pain.).     metoprolol succinate (TOPROL-XL) 25 MG 24 hr tablet Take 1 tablet (25 mg total) by mouth 2 (two) times daily. 60 tablet 3   mycophenolate (MYFORTIC) 180 MG EC tablet Take 540 mg by mouth 2 (two) times daily.     omeprazole (PRILOSEC) 20 MG capsule Take 20 mg by mouth daily as needed (acid reflux).     oxyCODONE-acetaminophen  (PERCOCET) 5-325 MG tablet Take 1 tablet by mouth every 4 (four) hours as needed for severe pain. 20 tablet 0   predniSONE (DELTASONE) 5 MG tablet Take 5 mg by mouth in the morning.     sulfamethoxazole-trimethoprim (BACTRIM) 400-80 MG tablet Take 1 tablet by mouth every Monday, Wednesday, and Friday. In the morning     XARELTO 20 MG TABS tablet Take 1 tablet (20 mg total) by mouth daily with supper. 90 tablet 3   Current Facility-Administered Medications  Medication Dose Route Frequency Provider Last Rate Last Admin   sodium chloride flush (NS) 0.9 % injection 3 mL  3 mL Intravenous Q12H Wendall Stade, MD        No Known Allergies   REVIEW OF SYSTEMS:   [X]  denotes positive finding, [ ]  denotes negative finding Cardiac  Comments:  Chest pain or chest pressure:    Shortness of breath upon exertion:    Short of breath when lying flat:    Irregular heart rhythm:        Vascular    Pain in calf, thigh, or hip brought on by ambulation:    Pain in feet at night that wakes you up from your sleep:     Blood clot in your veins:    Leg swelling:         Pulmonary    Oxygen at home:    Productive cough:     Wheezing:         Neurologic    Sudden weakness in arms or legs:     Sudden numbness in arms or legs:     Sudden onset of difficulty speaking or slurred speech:    Temporary loss of vision in one eye:     Problems with dizziness:         Gastrointestinal    Blood in stool:     Vomited blood:         Genitourinary    Burning when urinating:     Blood in urine:        Psychiatric    Major  depression:         Hematologic    Bleeding problems:    Problems with blood clotting too easily:        Skin    Rashes or ulcers:        Constitutional    Fever or chills:      PHYSICAL EXAMINATION:  Vitals:   06/17/23 1255  BP: (!) 154/92  Pulse: 64  Temp: 98.6 F (37 C)  TempSrc: Temporal  SpO2: 97%  Weight: 259 lb (117.5 kg)    General:  WDWN in NAD; vital  signs documented above Gait: Normal HENT: WNL, normocephalic Pulmonary: normal non-labored breathing , without wheezing Cardiac: regular HR Vascular Exam/Pulses: 2+ right radial pulse Extremities: right AV fistula with good thrill, some pulsatility. The incision has healed well. There is swelling, erythema and an area that appears shiny and almost fluctuant at the medial [facility] as shown below.   Musculoskeletal: no muscle wasting or atrophy  Neurologic: A&O X 3 Psychiatric:  The pt has Normal affect.  ASSESSMENT/PLAN:: 62 y.o. 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 He is s/p banding of right arm AV fistula with 6 mm balloon by Dr. Randie Heinz on 03/31/23. Fistula continues to be very aneurysmal throughout and has more recently increased in size with area at the [facility] that is erythematous and appears infected. Patient saw Cardiologist who recommends ligation and we previously discussed that best option would be to ligate the fistula. He has a functioning renal transplant. He would like if possible for the distal aspect of the fistula to be removed as well. I discussed with patient and his wife that if we removed the whole fistula it would be very extensive surgery with large incision and possible deformity of the upper arm. I have recommended just revision of the distal aspect and ligation. I have prophylactically started him on antibiotics. - Keflex 500 mg BID #14 no refills sent to his pharmacy - Will arrange right AV fistula revision with ligation with Dr. Randie Heinz in the near future   Graceann Congress, [place] [facility] [phone]  Clinic MD: Randie Heinz

## 2023-06-17 NOTE — Progress Notes (Signed)
Office Note     CC:  follow up Requesting Provider:  Dana Allan, MD  HPI: [name] [name] is a 62 y.o. 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  He is s/p banding of right arm AV fistula with 6 mm balloon by Dr. Randie Heinz on 03/31/23. Patient called because he and his wife feel that aneurysm is till increasing in size with some thinning of the overlying skin with redness. He says this started to appear over the past couple days. He denies any fever or chills.  He saw his Cardiologist since his last visit who recommends ligation of the fistula due to his high output heart failure. Has a functioning renal transplant.   Past Medical History:  Diagnosis Date   Abnormal stress test 05/07/2018   Actinic keratoses 05/01/2020   Arthritis    Ascending aorta dilatation (HCC)    46 mm ascending thoracic aorta aneurysm by 02/26/23 TTE   Atrial fibrillation (HCC) 11/28/2016   Atrial fibrillation (HCC) 11/28/2016   Atrial fibrillation with RVR (HCC)    Atrial fibrillation with RVR (HCC)    Cataract    bilateral- right removed, left present   Cervical radiculopathy 03/01/2020   Cherry angioma 05/01/2020   Chest pain syndrome 01/26/2020   Chest pain syndrome 01/26/2020   Chronic diastolic CHF (congestive heart failure) (HCC)    Chronic kidney disease, stage V (HCC) 09/18/2014   Complication of anesthesia    problem with bladder not waking up after surgery-for right nephrectomy   Dermatofibroma 05/01/2020   Dry skin 05/01/2020   Dysrhythmia    Elevated hemidiaphragm 02/16/2023   right   Encounter for adequacy testing for dialysis [facility]) 09/18/2014   Encounter for adequacy testing for dialysis (HCC) 09/18/2014   Encounter for therapeutic drug monitoring 12/05/2016   Enlarged kidney    right   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   Folliculitis 05/01/2020   GERD without esophagitis 12/05/2020   Gout    History of gout 01/30/2020   HLD  (hyperlipidemia) 11/28/2016   Hypertension    Hypokalemia 11/28/2016   Hypomagnesemia 01/30/2020   Immunosuppression (HCC) 01/17/2019   Immunosuppressive management encounter following kidney transplant 01/25/2019   Immunosuppressive management encounter following kidney transplant 01/25/2019   Lapband APS July 2015 06/20/2014   Left shoulder pain 06/14/2020   Mixed hyperlipidemia 11/13/2014   Mixed hyperlipidemia 11/13/2014   Morbid obesity BMI 42 01/04/2014   Multiple benign nevi 05/01/2020   Paroxysmal A-fib (HCC) 04/22/2022   Renal disorder    Renal failure 07/13/2015   Renal failure 07/13/2015   Renal insufficiency    KIDNEY DISEASE - FOLLOWED BY DR. Eliott Nine NOTES ON CHART   Renal insufficiency 04/22/2022   KIDNEY DISEASE - FOLLOWED BY DR. 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 12/24/2018   Serum total bilirubin elevated 01/30/2020   Skin tag 05/01/2020   Stage 3b chronic kidney disease (HCC) 01/30/2020   Stage 3b chronic kidney disease (HCC) 01/30/2020   Status post gastric banding 06/20/2014   Status post gastric banding 06/20/2014   Status post laparoscopic cholecystectomy August 2016 07/13/2015   Status post living-donor kidney transplantation 01/17/2019   Sun-damaged skin 05/01/2020   TIA (transient ischemic attack) 07/14/2017   Vertigo     Past Surgical History:  Procedure Laterality Date  ATRIAL FIBRILLATION ABLATION N/A 09/25/2022   Procedure: ATRIAL FIBRILLATION ABLATION;  Surgeon: Lanier Prude, MD;  Location: [facility] INVASIVE CV LAB;  Service: Cardiovascular;  Laterality: N/A;   AV FISTULA PLACEMENT     "IT CLOSED UP AND DOES NOT WORK"   AV FISTULA PLACEMENT Right 10/27/2014   Procedure: ARTERIOVENOUS (AV) FISTULA CREATION;  Surgeon: Fransisco Hertz, MD;  Location: [facility] OR;  Service:  Vascular;  Laterality: Right;   AV FISTULA PLACEMENT Right 08/22/2015   Procedure: BRACHIOCEPHALIC ARTERIOVENOUS (AV) FISTULA CREATION;  Surgeon: Fransisco Hertz, MD;  Location: [facility] OR;  Service: Vascular;  Laterality: Right;   CATARACTS REMOVED Right    CHOLECYSTECTOMY N/A 07/13/2015   Procedure: LAPAROSCOPIC CHOLECYSTECTOMY WITH INTRAOPERATIVE CHOLANGIOGRAM;  Surgeon: Luretha Murphy, MD;  Location: [facility] ORS;  Service: General;  Laterality: N/A;   EYE SURGERY     left cataract surgery-lens implant   HERNIA REPAIR     INSERTION OF MESH N/A 06/20/2014   Procedure: INSERTION OF MESH;  Surgeon: Valarie Merino, MD;  Location: [facility] ORS;  Service: General;  Laterality: N/A;   KNEE SURGERY  2001   LAPAROSCOPIC GASTRIC BANDING N/A 06/20/2014   Procedure: LAPAROSCOPIC GASTRIC BANDING and VENTRAL HERNIA REPAIR WITH MESH;  Surgeon: Valarie Merino, MD;  Location: [facility] ORS;  Service: General;  Laterality: N/A;   REVISON OF ARTERIOVENOUS FISTULA Right 03/31/2023   Procedure: RIGHT ARM ARTERIOVENOUS FISTULA BANDING;  Surgeon: Maeola Harman, MD;  Location: [facility] OR;  Service: Vascular;  Laterality: Right;   RIGHT HEART CATH N/A 02/24/2023   Procedure: RIGHT HEART CATH;  Surgeon: Dorthula Nettles, DO;  Location: [facility] INVASIVE CV LAB;  Service: Cardiovascular;  Laterality: N/A;   TEE WITHOUT CARDIOVERSION N/A 09/25/2022   Procedure: TRANSESOPHAGEAL ECHOCARDIOGRAM (TEE);  Surgeon: Lanier Prude, MD;  Location: [facility] INVASIVE CV LAB;  Service: Cardiovascular;  Laterality: N/A;   TOTAL NEPHRECTOMY Right     Social History   Socioeconomic History   Marital status: Married    Spouse name: Not on file   Number of children: 12   Years of education: 2   Highest education level: Not on file  Occupational History   Occupation: Retired  Tobacco Use   Smoking status: Never    Passive exposure: Past   Smokeless tobacco: Never  Vaping Use   Vaping status: Never Used  Substance and Sexual Activity   Alcohol  use: No    Alcohol/week: 0.0 standard drinks of alcohol   Drug use: No   Sexual activity: Not on file  Other Topics Concern   Not on file  Social History Narrative   Lives at home with his wife.   Right-handed.   No caffeine use.   Social Determinants of Health   Financial Resource Strain: Not on file  Food Insecurity: Not on file  Transportation Needs: Not on file  Physical Activity: Not on file  Stress: Not on file  Social Connections: Not on file  Intimate Partner Violence: Not on file    Family History  Problem Relation Age of Onset   Hyperlipidemia Mother    Hypertension Mother    Heart disease Mother        before age 47   Dementia Mother    Early death Father    Cancer Father        bone   Arthritis Sister    Cancer Sister        breast   Drug abuse Sister  Depression Sister    Alcohol abuse Sister    Cancer Sister        breast   CAD Other    Colon cancer Neg Hx    Rectal cancer Neg Hx    Stomach cancer Neg Hx     Current Outpatient Medications  Medication Sig Dispense Refill   atorvastatin (LIPITOR) 40 MG tablet Take 1 tablet (40 mg total) by mouth daily. 90 tablet 1   diphenhydramine-acetaminophen (TYLENOL PM) 25-500 MG TABS tablet Take 2 tablets by mouth at bedtime.     ENVARSUS XR 1 MG TB24 Take 5 mg by mouth in the morning.     furosemide (LASIX) 40 MG tablet Take 20 mg by mouth daily. Prescribed 01/30/23 Dr Margretta Sidle (nephrology)     magnesium oxide (MAG-OX) 400 MG tablet Take 400 mg by mouth 2 (two) times daily. Lunch and supper     Menthol (BIOFREEZE) 10.5 % AERO Apply 1 spray topically daily as needed (shoulder pain.).     metoprolol succinate (TOPROL-XL) 25 MG 24 hr tablet Take 1 tablet (25 mg total) by mouth 2 (two) times daily. 60 tablet 3   mycophenolate (MYFORTIC) 180 MG EC tablet Take 540 mg by mouth 2 (two) times daily.     omeprazole (PRILOSEC) 20 MG capsule Take 20 mg by mouth daily as needed (acid reflux).     oxyCODONE-acetaminophen  (PERCOCET) 5-325 MG tablet Take 1 tablet by mouth every 4 (four) hours as needed for severe pain. 20 tablet 0   predniSONE (DELTASONE) 5 MG tablet Take 5 mg by mouth in the morning.     sulfamethoxazole-trimethoprim (BACTRIM) 400-80 MG tablet Take 1 tablet by mouth every Monday, Wednesday, and Friday. In the morning     XARELTO 20 MG TABS tablet Take 1 tablet (20 mg total) by mouth daily with supper. 90 tablet 3   Current Facility-Administered Medications  Medication Dose Route Frequency Provider Last Rate Last Admin   sodium chloride flush (NS) 0.9 % injection 3 mL  3 mL Intravenous Q12H Wendall Stade, MD        No Known Allergies   REVIEW OF SYSTEMS:   [X]  denotes positive finding, [ ]  denotes negative finding Cardiac  Comments:  Chest pain or chest pressure:    Shortness of breath upon exertion:    Short of breath when lying flat:    Irregular heart rhythm:        Vascular    Pain in calf, thigh, or hip brought on by ambulation:    Pain in feet at night that wakes you up from your sleep:     Blood clot in your veins:    Leg swelling:         Pulmonary    Oxygen at home:    Productive cough:     Wheezing:         Neurologic    Sudden weakness in arms or legs:     Sudden numbness in arms or legs:     Sudden onset of difficulty speaking or slurred speech:    Temporary loss of vision in one eye:     Problems with dizziness:         Gastrointestinal    Blood in stool:     Vomited blood:         Genitourinary    Burning when urinating:     Blood in urine:        Psychiatric    Major  depression:         Hematologic    Bleeding problems:    Problems with blood clotting too easily:        Skin    Rashes or ulcers:        Constitutional    Fever or chills:      PHYSICAL EXAMINATION:  Vitals:   06/17/23 1255  BP: (!) 154/92  Pulse: 64  Temp: 98.6 F (37 C)  TempSrc: Temporal  SpO2: 97%  Weight: 259 lb (117.5 kg)    General:  WDWN in NAD; vital  signs documented above Gait: Normal HENT: WNL, normocephalic Pulmonary: normal non-labored breathing , without wheezing Cardiac: regular HR Vascular Exam/Pulses: 2+ right radial pulse Extremities: right AV fistula with good thrill, some pulsatility. The incision has healed well. There is swelling, erythema and an area that appears shiny and almost fluctuant at the medial [facility] as shown below.   Musculoskeletal: no muscle wasting or atrophy  Neurologic: A&O X 3 Psychiatric:  The pt has Normal affect.  ASSESSMENT/PLAN:: 62 y.o. 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 He is s/p banding of right arm AV fistula with 6 mm balloon by Dr. Randie Heinz on 03/31/23. Fistula continues to be very aneurysmal throughout and has more recently increased in size with area at the [facility] that is erythematous and appears infected. Patient saw Cardiologist who recommends ligation and we previously discussed that best option would be to ligate the fistula. He has a functioning renal transplant. He would like if possible for the distal aspect of the fistula to be removed as well. I discussed with patient and his wife that if we removed the whole fistula it would be very extensive surgery with large incision and possible deformity of the upper arm. I have recommended just revision of the distal aspect and ligation. I have prophylactically started him on antibiotics. - Keflex 500 mg BID #14 no refills sent to his pharmacy - Will arrange right AV fistula revision with ligation with Dr. Randie Heinz in the near future   [name] [name], [place] [facility] [phone]  Clinic MD: Randie Heinz

## 2023-06-18 ENCOUNTER — Other Ambulatory Visit: Payer: Self-pay

## 2023-06-18 DIAGNOSIS — N186 End stage renal disease: Secondary | ICD-10-CM

## 2023-06-22 ENCOUNTER — Other Ambulatory Visit: Payer: Self-pay

## 2023-06-22 ENCOUNTER — Encounter (HOSPITAL_COMMUNITY): Payer: Self-pay | Admitting: Vascular Surgery

## 2023-06-22 NOTE — Anesthesia Preprocedure Evaluation (Signed)
Anesthesia Evaluation  Patient identified by MRN, date of birth, ID band Patient awake    Reviewed: Allergy & Precautions, NPO status , Patient's Chart, lab work & pertinent test results, reviewed documented beta blocker date and time   History of Anesthesia Complications Negative for: history of anesthetic complications  Airway Mallampati: II  TM Distance: >3 FB Neck ROM: Full    Dental  (+) Edentulous Upper, Edentulous Lower   Pulmonary neg pulmonary ROS   Pulmonary exam normal        Cardiovascular hypertension, Pt. on medications and Pt. on home beta blockers pulmonary hypertensionNormal cardiovascular exam+ dysrhythmias Atrial Fibrillation    '24 TTE - EF 60 to 65%. There is mild left ventricular hypertrophy. There is moderately elevated pulmonary artery systolic pressure. Left atrial size was moderately dilated. Mild mitral valve regurgitation. Aneurysm of the ascending aorta, measuring 46 mm.     Neuro/Psych  Vertigo  TIA negative psych ROS   GI/Hepatic Neg liver ROS,GERD  Medicated and Controlled,,  Endo/Other  diabetes    Renal/GU ESRFRenal disease     Musculoskeletal  (+) Arthritis ,    Abdominal   Peds  Hematology  On xarelto    Anesthesia Other Findings   Reproductive/Obstetrics                             Anesthesia Physical Anesthesia Plan  ASA: 3  Anesthesia Plan: MAC   Post-op Pain Management: Minimal or no pain anticipated   Induction: Intravenous  PONV Risk Score and Plan: 1 and Treatment may vary due to age or medical condition  Airway Management Planned: Natural Airway and Simple Face Mask  Additional Equipment: None  Intra-op Plan:   Post-operative Plan:   Informed Consent: I have reviewed the patients History and Physical, chart, labs and discussed the procedure including the risks, benefits and alternatives for the proposed anesthesia with the  patient or authorized representative who has indicated his/her understanding and acceptance.       Plan Discussed with: CRNA and Anesthesiologist  Anesthesia Plan Comments: (See PAT note )       Anesthesia Quick Evaluation

## 2023-06-22 NOTE — Progress Notes (Signed)
Anesthesia Chart Review: Maury Dus  Case: [id number] Date/Time: 06/23/23 1410   Procedures:      REVISION OF RIGHT ARTERIOVENOUS FISTULA (Right)     LIGATION OF ARTERIOVENOUS  FISTULA (Right)   Anesthesia type: Choice   Pre-op diagnosis: ESRD   [facility] / [facility]   Surgeons: Maeola Harman, MD       DISCUSSION: Patient is a 62 year old male scheduled for the above procedure. He has a RUE AVF that is not currently being used since he underwent LDKT on 01/13/19. S/p AVF banding 03/31/23 due to high out-flow CHF felt related to his AVF. AVF still large. He has also had ongoing dyspnea which worsened after his 08/2022 ablation. Primary and EP cardiology are both aware. Dr. Eden Emms recommended ligation of AVF. He was also started on Keflex recently by [facility] due to erythema around the antecubital incision site.    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 He had chronic elevation of his right hemidiaphragm.    Last cardiology visit with Dr. Eden Emms was on 06/02/23 for dyspnea. He wrote, "His fistula not ligated enough and still large. Discussing with Dr Randie Heinz to tie it off. Also needs to discuss with Dr Lalla Brothers stopping DOAC or consideration for watchman I discussed doing non contrast chest CT as a preliminary way to look at PV stenosis. He would need a PV CTA to consider Watchman At a Cr of 2.0 I think this is possible." He has follow-up with Dr. Lalla Brothers on 07/01/23.   .  Evaluation by pulmonologist Dr. Judeth Horn on 04/02/23 ford DOE. "Patient has many reasons for dyspnea including pulmonary hypertension group 2 and 5 given  preceding left atrial dilation, mitral valve regurgitation, elevated pulmonary capillary wedge pressure, fistula and high output demonstrated on catheterization.  Hopefully will see some improvement after fistula banding.  He also has evidence of left atrial dilation, mitral valve regurgitation, with normal physiology of exercise and increased blood pressure this will worsen and lead to transient pulmonary venous hypertension which also causes dyspnea.  Lastly, he has chronically right elevated right hemidiaphragm that can cause dyspnea.  However, he reports sudden onset of dyspnea and equivalent of orthopnea immediately following atrial fibrillation ablation.  Reviewed operative report and the left atrium was involved near the pulmonary vein.  Quite possible he has subsequent pulmonary venous stenosis following the procedure.  I have messaged EP and primary cardiologist regarding this possibility and asked for assistance in ongoing workup.  The other etiologies of his dyspnea as discussed above have no clear intervention to make better other than what is already been done.  His pulmonary function test fit with extrathoracic restriction given mild reduction in TLC and severely reduced ERV likely related to right hemidiaphragm paralysis as well as mildly reduced DLCO as can be seen with pulmonary vascular disease, pulmonary hypertension.  Rest of chest imaging is clear.  No further pulmonary workup indicated."    Dr. Lalla Brothers subsequently contacted Mr. Alvidrez on 04/02/23. He was still recovering from AVF banding procedure. Some improvement in breathing, but not to where he wanted it.  3 month follow-up to re-assess symptoms, scheduled for 07/01/23.   Anesthesia team to evaluate on the day  of surgery. He is on Envarsus XR, Myfortic, prednisone 5 mg daily and on Bactrim MWF given renal transplant.    [facility]VS advised to hold Xarelto for 2 days prior to procedure, last dose 06/20/23.      VS:  BP Readings from Last 3  Encounters:  06/17/23 (!) 154/92  06/02/23 128/74  05/27/23 (!) 140/86   Pulse Readings from Last 3 Encounters:  06/17/23 64  06/02/23 (!) 56  05/27/23 61     PROVIDERS: Dana Allan, MD is PCP  - Debbe Odea, MD is primary cardiologist. Last visit 06/02/23 with Charlton Haws, MD as above.  Steffanie Dunn, MD is EP cardiologist. See by Alphonzo Severance, PA on 10/27/22 at [facility]. Continue Toprol. Continue Xarelto for 3 months post ablation recommended. Saw Dr. Lalla Brothers 12/29/22 and reported a few episodes of racing heart beat. Decision to continue Xarelto for now, and will consider evaluation for LAA occlusion (Watchman Device). Was being considered for this in late May 2023. Per telephone encounter on 04/02/23, patient still recovering from AVF banding procedure. Plan to follow-up in 3 months to see if symptoms improved and go from there.  Vilma Meckel, MD is pulmonologist. As needed follow-up at 04/02/23 evaluation.   LABS: For day of surgery. Most recent results in [facility] include:  Lab Results  Component Value Date   WBC 6.1 01/27/2023   HGB 15.0 03/31/2023   HCT 44.0 03/31/2023   PLT 156 01/27/2023   GLUCOSE 109 (H) 03/31/2023   NA 139 03/31/2023   K 3.7 03/31/2023   CL 100 03/31/2023   CREATININE 2.00 (H) 03/31/2023   BUN 23 03/31/2023   CO2 25 01/27/2023   TSH 1.170 01/27/2023    PFTs 01/29/23: FVC 2.49 (51%), post 2.51 (52%). FEV1 2.08 (57%), post 2.15 (59%). DLCO unc/cor 19.50 (71%).     IMAGES: CT Chest 06/11/23: IMPRESSION: No acute cardiopulmonary disease. Suspected pulmonary arterial hypertension. Aortic Atherosclerosis (ICD10-I70.0). Comparison 02/16/23: Enlargement of the pulmonary outflow tract/main pulmonary arteries suggests PAH, asymmetric elevation right hemidiaphragm with probable chronic atelectasis/scarring at the right base, large varices right anterior chest wall and axillary region, likely secondary to upper extremity AV  fistula.  Sniff Test 02/16/23: IMPRESSION: 1. Chronic moderate elevation of the right hemidiaphragm at rest. 2. Paradoxical elevation of the right hemidiaphragm with sniff maneuver, compatible with right diaphragmatic plegia. 3. Normal muscular function of the left hemidiaphragm.     EKG: 02/24/23: Sinus bradycardia at 58 bpm Otherwise normal ECG When compared with ECG of 27-Oct-2022 10:58, No significant change since last tracing Confirmed by Riley Lam [phone]) on 02/24/2023 1:31:57 PM     CV: Echo 02/26/23: IMPRESSIONS:  1. Left ventricular ejection fraction, by estimation, is 60 to 65%. The  left ventricle has normal function. The left ventricle has no regional  wall motion abnormalities. There is mild left ventricular hypertrophy.  Left ventricular diastolic parameters  are indeterminate.   2. Right ventricular systolic function is normal. The right ventricular  size is normal. There is moderately elevated pulmonary artery systolic  pressure. The estimated right ventricular systolic pressure is 52.0 mmHg.   3. Left atrial size was moderately dilated.   4. Mild mitral valve regurgitation.   5. The aortic valve is tricuspid. Aortic valve regurgitation is not  visualized.   6. Aneurysm of the ascending aorta, measuring 46 mm.   7. The inferior vena cava is dilated in size with <50% respiratory  variability, suggesting right atrial pressure of 15 mmHg.  -  Conclusion(s)/Recommendation(s): Compared to TTE 12/20/2021,size of the ascending aorta has increased.      RHC 02/24/23: HEMODYNAMICS: RA:                  6 mmHg (mean) RV:                  50/10 mmHg PA:                  54/14 mmHg (35 mean) PCWP:            15-20 with V waves (difficulty completing occluding for PCWP)                                       Thermodilution CO/CI 12.7 L/min, 5.7 L/min/m2 PA sat: 73-74%            NIBP: 147/70                                      TPG                 15   mmHg                                            PVR                 1.2 Wood Units  PAPi                6.7     Hemodynamics after inflating cuff to 120-130 systolic PA: 42/10 (33) Thermodilution: 9.68 L/min, 4.18 L/min/m2   IMPRESSION: Normal pre and post capillary filling pressures Moderately elevated PA mean with normal PVR likely secondary to high output.  Elevated cardiac output / cardiac index consistent with high output heart failure secondary to AVF.  After inflation of manual cuff to 120-130 systolic for 3 minutes there was a reduction in PA systolic and a reduction in thermodilution cardiac output by 3 L/min.    RECOMMENDATIONS: Consider evaluation for ligation or banding to reduce flows through right arm AVF.      Long term monitor 11/21/21 - 12/05/21: Patient had a min HR of 39 bpm, max HR of 218 bpm, and avg HR of 61 bpm. Predominant underlying rhythm was Sinus Rhythm. 2 Ventricular Tachycardia runs occurred, the run with the fastest interval lasting 5 beats with a max rate of 156 bpm, the longest  lasting 13 beats with an avg rate of 129 bpm. 34 Supraventricular Tachycardia runs occurred, the run with the fastest interval lasting 5 beats with a max rate of 218 bpm, the longest lasting 13.5 secs with an avg rate of 100 bpm. Ventricular Tachycardia  and Supraventricular Tachycardia were detected within +/- 45 seconds of symptomatic patient event(s). Isolated SVEs were occasional (3.4%, 38227),  No atrial fibrillation or flutter was noted.     Nuclear Stress Test 02/05/22 ([facility]): 1.  MYOCARDIAL PERFUSION: No evidence of inducible ischemia or scar/hibernating myocardium.  2.  LEFT VENTRICULAR EJECTION FRACTION: Normal. LVEF 56%. However, the heart is dilated.  3.  REGIONAL WALL MOTION: Septal dyskinesis, otherwise motion within normal limits.      LHC 05/10/18 ([facility]): Coronary  Findings Diagnostic Dominance: Left  Left Anterior Descending: Mid LAD lesion, 20%  stenosed. First Diagonal Branch: 1st Diag lesion, 40% stenosed.  Ramus Intermedius: The vessel is small.  Left Circumflex: Ost Cx lesion, 30% stenosed.  - Non-obstructive coronary artery disease.    Past Medical History:  Diagnosis Date   Abnormal stress test 05/07/2018   Actinic keratoses 05/01/2020   Arthritis    Ascending aorta dilatation (HCC)    46 mm ascending thoracic aorta aneurysm by 02/26/23 TTE   Atrial fibrillation (HCC) 11/28/2016   Atrial fibrillation (HCC) 11/28/2016   Atrial fibrillation with RVR (HCC)    Atrial fibrillation with RVR (HCC)    Cataract    bilateral- right removed, left present   Cervical radiculopathy 03/01/2020   Cherry angioma 05/01/2020   Chest pain syndrome 01/26/2020   Chest pain syndrome 01/26/2020   Chronic diastolic CHF (congestive heart failure) (HCC)    Chronic kidney disease, stage V (HCC) 09/18/2014   Complication of anesthesia    problem with bladder not waking up after surgery-for right nephrectomy   Dermatofibroma 05/01/2020   Dry skin 05/01/2020   Dysrhythmia    Elevated hemidiaphragm 02/16/2023   right   Encounter for adequacy testing for dialysis [facility]) 09/18/2014   Encounter for adequacy testing for dialysis (HCC) 09/18/2014   Encounter for therapeutic drug monitoring 12/05/2016   Enlarged kidney    right   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   Folliculitis 05/01/2020   GERD without esophagitis 12/05/2020   Gout    History of gout 01/30/2020   HLD (hyperlipidemia) 11/28/2016   Hypertension    Hypokalemia 11/28/2016   Hypomagnesemia 01/30/2020   Immunosuppression (HCC) 01/17/2019   Immunosuppressive management encounter following kidney transplant 01/25/2019   Immunosuppressive management encounter following kidney transplant 01/25/2019   Lapband APS July 2015 06/20/2014   Left shoulder pain 06/14/2020   Mixed hyperlipidemia 11/13/2014   Mixed hyperlipidemia 11/13/2014   Morbid obesity BMI 42 01/04/2014   Multiple  benign nevi 05/01/2020   Paroxysmal A-fib (HCC) 04/22/2022   Renal disorder    Renal failure 07/13/2015   Renal failure 07/13/2015   Renal insufficiency    KIDNEY DISEASE - FOLLOWED BY DR. Eliott Nine NOTES ON CHART   Renal insufficiency 04/22/2022   KIDNEY DISEASE - FOLLOWED BY DR. 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 12/24/2018   Serum total bilirubin elevated 01/30/2020   Skin tag 05/01/2020   Stage 3b chronic kidney disease (HCC) 01/30/2020   Stage 3b chronic kidney disease (HCC) 01/30/2020   Status post gastric banding 06/20/2014   Status post gastric banding 06/20/2014   Status post laparoscopic cholecystectomy August 2016 07/13/2015   Status post living-donor kidney transplantation 01/17/2019   Sun-damaged skin 05/01/2020   TIA (transient ischemic attack) 07/14/2017   Vertigo     Past Surgical History:  Procedure Laterality Date   ATRIAL FIBRILLATION ABLATION N/A 09/25/2022   Procedure: ATRIAL FIBRILLATION ABLATION;  Surgeon: Lanier Prude, MD;  Location: [facility] INVASIVE CV LAB;  Service: Cardiovascular;  Laterality: N/A;   AV FISTULA PLACEMENT     "IT CLOSED UP AND DOES NOT WORK"   AV FISTULA PLACEMENT Right 10/27/2014   Procedure: ARTERIOVENOUS (AV) FISTULA CREATION;  Surgeon: Fransisco Hertz, MD;  Location: [facility] OR;  Service: Vascular;  Laterality: Right;   AV FISTULA PLACEMENT  Right 08/22/2015   Procedure: BRACHIOCEPHALIC ARTERIOVENOUS (AV) FISTULA CREATION;  Surgeon: Fransisco Hertz, MD;  Location: [facility] OR;  Service: Vascular;  Laterality: Right;   CATARACTS REMOVED Right    CHOLECYSTECTOMY N/A 07/13/2015   Procedure: LAPAROSCOPIC CHOLECYSTECTOMY WITH INTRAOPERATIVE CHOLANGIOGRAM;  Surgeon: Luretha Murphy, MD;  Location: [facility] ORS;  Service: General;  Laterality: N/A;   EYE SURGERY     left cataract  surgery-lens implant   HERNIA REPAIR     INSERTION OF MESH N/A 06/20/2014   Procedure: INSERTION OF MESH;  Surgeon: Valarie Merino, MD;  Location: [facility] ORS;  Service: General;  Laterality: N/A;   KNEE SURGERY  2001   LAPAROSCOPIC GASTRIC BANDING N/A 06/20/2014   Procedure: LAPAROSCOPIC GASTRIC BANDING and VENTRAL HERNIA REPAIR WITH MESH;  Surgeon: Valarie Merino, MD;  Location: [facility] ORS;  Service: General;  Laterality: N/A;   REVISON OF ARTERIOVENOUS FISTULA Right 03/31/2023   Procedure: RIGHT ARM ARTERIOVENOUS FISTULA BANDING;  Surgeon: Maeola Harman, MD;  Location: [facility] OR;  Service: Vascular;  Laterality: Right;   RIGHT HEART CATH N/A 02/24/2023   Procedure: RIGHT HEART CATH;  Surgeon: Dorthula Nettles, DO;  Location: [facility] INV[facility] CV LAB;  Service: Cardiovascular;  Laterality: N/A;   TEE WITHOUT CARDIOVERSION N/A 09/25/2022   Procedure: TRANSESOPHAGEAL ECHOCARDIOGRAM (TEE);  Surgeon: Lanier Prude, MD;  [facility] INVASIVE CV LAB;  Service: Cardiovascular;  Laterality: N/A;   TOTAL NEPHRECTOMY Right     MEDICATIONS:  sodium chloride flush (NS) 0.9 % injection 3 mL    atorvastatin (LIPITOR) 40 MG tablet   cephALEXin (KEFLEX) 500 MG capsule   diphenhydramine-acetaminophen (TYLENOL PM) 25-500 MG TABS tablet   ENVARSUS XR 1 MG TB24   furosemide (LASIX) 40 MG tablet   magnesium oxide (MAG-OX) 400 MG tablet   Menthol (BIOFREEZE) 10.5 % AERO   metoprolol succinate (TOPROL-XL) 25 MG 24 hr tablet   mycophenolate (MYFORTIC) 180 MG EC tablet   omeprazole (PRILOSEC) 20 MG capsule   predniSONE (DELTASONE) 5 MG tablet   sulfamethoxazole-trimethoprim (BACTRIM) 400-80 MG tablet   XARELTO 20 MG TABS tablet   oxyCODONE-acetaminophen (PERCOCET) 5-325 MG tablet    Shonna Chock, PA-C Surgical Short Stay/Anesthesiology [facility] Phone [phone] [facility] Phone [phone] 06/22/2023 12:21 PM

## 2023-06-22 NOTE — Progress Notes (Signed)
PCP - Dana Allan, MD Cardiologist - Charlton Haws, MD  PPM/ICD - denies  Chest x-ray - 09/23/22 EKG - 02/24/23 ECHO - 02/26/23 Cardiac Cath - 02/24/23  CPAP - n/a  Fasting Blood Sugar - n/a  Blood Thinner Instructions: Xarelto - last day - 06/20/23 per patient Patient was instructed: As of today, STOP taking any Aspirin (unless otherwise instructed by your surgeon) Aleve, Naproxen, Ibuprofen, Motrin, Advil, Goody's, BC's, all herbal medications, fish oil, and all vitamins.  ERAS Protcol - n/a  COVID TEST- n/a  Anesthesia review: yes  Patient verbally denies any shortness of breath, fever, cough and chest pain during phone call   -------------  SDW INSTRUCTIONS given:  Your procedure is scheduled on Tuesday, July 23rd, 2024.  Report to [facility] Main Entrance "A" at 11:00 A.M., and check in at the Admitting office.  Call this number if you have problems the morning of surgery:  [phone]   Remember:  Do not eat or drink after midnight the night before your surgery    Take these medicines the morning of surgery with A SIP OF WATER: Lipitor, Keflex, Metoprolol, Prednisone, Envarsus, Mycophenolate  PRN: Prilosec   The day of surgery:                     Do not wear jewelry,             Do not wear lotions, powders, colognes, or deodorant.            Men may shave face and neck.            Do not bring valuables to the hospital.            [facility] is not responsible for any belongings or valuables.  Do NOT Smoke (Tobacco/Vaping) 24 hours prior to your procedure If you use a CPAP at night, you may bring all equipment for your overnight stay.   Contacts, glasses, dentures or bridgework may not be worn into surgery.      For patients admitted to the hospital, discharge time will be determined by your treatment team.   Patients discharged the day of surgery will not be allowed to drive home, and someone needs to stay with them for 24 hours.    Special  instructions:   [facility]- Preparing For Surgery  Before surgery, you can play an important role. Because skin is not sterile, your skin needs to be as free of germs as possible. You can reduce the number of germs on your skin by washing with CHG (chlorahexidine gluconate) Soap before surgery.  CHG is an antiseptic cleaner which kills germs and bonds with the skin to continue killing germs even after washing.    Oral Hygiene is also important to reduce your risk of infection.  Remember - BRUSH YOUR TEETH THE MORNING OF SURGERY WITH YOUR REGULAR TOOTHPASTE  Please do not use if you have an allergy to CHG or antibacterial soaps. If your skin becomes reddened/irritated stop using the CHG.  Do not shave (including legs and underarms) for at least 48 hours prior to first CHG shower. It is OK to shave your face.  Please follow these instructions carefully.   Shower the NIGHT BEFORE SURGERY and the MORNING OF SURGERY with DIAL Soap.   Pat yourself dry with a CLEAN TOWEL.  Wear CLEAN PAJAMAS to bed the night before surgery  Place CLEAN SHEETS on your bed the night of your first shower and  DO NOT SLEEP WITH PETS.   Day of Surgery: Please shower morning of surgery  Wear Clean/Comfortable clothing the morning of surgery Do not apply any deodorants/lotions.   Remember to brush your teeth WITH YOUR REGULAR TOOTHPASTE.   Questions were answered. Patient verbalized understanding of instructions.

## 2023-06-23 ENCOUNTER — Encounter (HOSPITAL_COMMUNITY)
Admission: RE | Disposition: A | Discharge status: Home / Self Care | Payer: Self-pay | Source: Home / Self Care | Attending: Vascular Surgery

## 2023-06-23 ENCOUNTER — Other Ambulatory Visit: Payer: Self-pay

## 2023-06-23 ENCOUNTER — Ambulatory Visit (HOSPITAL_COMMUNITY): Payer: PPO | Admitting: Physician Assistant

## 2023-06-23 ENCOUNTER — Ambulatory Visit (HOSPITAL_BASED_OUTPATIENT_CLINIC_OR_DEPARTMENT_OTHER): Payer: PPO | Admitting: Physician Assistant

## 2023-06-23 ENCOUNTER — Ambulatory Visit (HOSPITAL_COMMUNITY)
Admission: RE | Admit: 2023-06-23 | Discharge: 2023-06-23 | Disposition: A | Discharge status: Home / Self Care | Payer: PPO | Attending: Vascular Surgery | Admitting: Vascular Surgery

## 2023-06-23 ENCOUNTER — Encounter (HOSPITAL_COMMUNITY): Payer: Self-pay | Admitting: Vascular Surgery

## 2023-06-23 DIAGNOSIS — N186 End stage renal disease: Secondary | ICD-10-CM | POA: Insufficient documentation

## 2023-06-23 DIAGNOSIS — I132 Hypertensive heart and chronic kidney disease with heart failure and with stage 5 chronic kidney disease, or end stage renal disease: Secondary | ICD-10-CM | POA: Insufficient documentation

## 2023-06-23 DIAGNOSIS — Z940 Kidney transplant status: Secondary | ICD-10-CM | POA: Insufficient documentation

## 2023-06-23 DIAGNOSIS — I5083 High output heart failure: Secondary | ICD-10-CM | POA: Insufficient documentation

## 2023-06-23 DIAGNOSIS — Z9049 Acquired absence of other specified parts of digestive tract: Secondary | ICD-10-CM | POA: Diagnosis not present

## 2023-06-23 DIAGNOSIS — Z992 Dependence on renal dialysis: Secondary | ICD-10-CM

## 2023-06-23 DIAGNOSIS — Z8673 Personal history of transient ischemic attack (TIA), and cerebral infarction without residual deficits: Secondary | ICD-10-CM | POA: Insufficient documentation

## 2023-06-23 DIAGNOSIS — I5032 Chronic diastolic (congestive) heart failure: Secondary | ICD-10-CM

## 2023-06-23 DIAGNOSIS — N185 Chronic kidney disease, stage 5: Secondary | ICD-10-CM | POA: Diagnosis not present

## 2023-06-23 DIAGNOSIS — I480 Paroxysmal atrial fibrillation: Secondary | ICD-10-CM | POA: Diagnosis not present

## 2023-06-23 DIAGNOSIS — T82898A Other specified complication of vascular prosthetic devices, implants and grafts, initial encounter: Secondary | ICD-10-CM | POA: Diagnosis not present

## 2023-06-23 DIAGNOSIS — Z9884 Bariatric surgery status: Secondary | ICD-10-CM | POA: Insufficient documentation

## 2023-06-23 DIAGNOSIS — E1122 Type 2 diabetes mellitus with diabetic chronic kidney disease: Secondary | ICD-10-CM | POA: Diagnosis not present

## 2023-06-23 DIAGNOSIS — I770 Arteriovenous fistula, acquired: Secondary | ICD-10-CM | POA: Diagnosis not present

## 2023-06-23 HISTORY — PX: PATCH ANGIOPLASTY: SHX6230

## 2023-06-23 HISTORY — PX: REVISON OF ARTERIOVENOUS FISTULA: SHX6074

## 2023-06-23 HISTORY — DX: Dyspnea, unspecified: R06.00

## 2023-06-23 SURGERY — REVISON OF ARTERIOVENOUS FISTULA
Anesthesia: Monitor Anesthesia Care | Site: Arm Upper | Laterality: Right

## 2023-06-23 MED ORDER — HEPARIN SODIUM (PORCINE) 1000 UNIT/ML IJ SOLN
INTRAMUSCULAR | Status: DC | PRN
  Administered 2023-06-23: 10000 [IU] via INTRAVENOUS

## 2023-06-23 MED ORDER — 0.9 % SODIUM CHLORIDE (POUR BTL) OPTIME
TOPICAL | Status: DC | PRN
  Administered 2023-06-23: 1000 mL

## 2023-06-23 MED ORDER — LIDOCAINE-EPINEPHRINE (PF) 1 %-1:200000 IJ SOLN
INTRAMUSCULAR | Status: AC
  Filled 2023-06-23: qty 30

## 2023-06-23 MED ORDER — OXYCODONE HCL 5 MG/5ML PO SOLN: 5.0000 mg | Freq: Once | ORAL | Status: DC | PRN

## 2023-06-23 MED ORDER — ONDANSETRON HCL 4 MG/2ML IJ SOLN: 4.0000 mg | Freq: Once | INTRAMUSCULAR | Status: DC | PRN

## 2023-06-23 MED ORDER — CEFAZOLIN SODIUM-DEXTROSE 2-4 GM/100ML-% IV SOLN
2.0000 g | INTRAVENOUS | Status: AC
  Administered 2023-06-23: 2 g via INTRAVENOUS

## 2023-06-23 MED ORDER — HEPARIN 6000 UNIT IRRIGATION SOLUTION
Status: AC
  Filled 2023-06-23: qty 500

## 2023-06-23 MED ORDER — FENTANYL CITRATE (PF) 250 MCG/5ML IJ SOLN
INTRAMUSCULAR | Status: DC | PRN
  Administered 2023-06-23 (×2): 25 ug via INTRAVENOUS

## 2023-06-23 MED ORDER — FENTANYL CITRATE (PF) 100 MCG/2ML IJ SOLN: 25.0000 ug | INTRAMUSCULAR | Status: DC | PRN

## 2023-06-23 MED ORDER — CHLORHEXIDINE GLUCONATE 4 % EX SOLN: 60.0000 mL | Freq: Once | CUTANEOUS | Status: DC

## 2023-06-23 MED ORDER — PROPOFOL 10 MG/ML IV BOLUS
INTRAVENOUS | Status: DC | PRN
  Administered 2023-06-23: 30 mg via INTRAVENOUS

## 2023-06-23 MED ORDER — SODIUM CHLORIDE 0.9 % IV SOLN: INTRAVENOUS | Status: DC

## 2023-06-23 MED ORDER — CHLORHEXIDINE GLUCONATE 0.12 % MT SOLN
OROMUCOSAL | Status: AC
  Administered 2023-06-23: 15 mL via OROMUCOSAL
  Filled 2023-06-23: qty 15

## 2023-06-23 MED ORDER — LIDOCAINE-EPINEPHRINE (PF) 1 %-1:200000 IJ SOLN
INTRAMUSCULAR | Status: DC | PRN
  Administered 2023-06-23: 10 mL

## 2023-06-23 MED ORDER — DIPHENHYDRAMINE HCL 50 MG/ML IJ SOLN
INTRAMUSCULAR | Status: DC | PRN
  Administered 2023-06-23: 25 mg via INTRAVENOUS

## 2023-06-23 MED ORDER — ORAL CARE MOUTH RINSE: 15.0000 mL | Freq: Once | OROMUCOSAL | Status: AC

## 2023-06-23 MED ORDER — DIPHENHYDRAMINE HCL 50 MG/ML IJ SOLN
INTRAMUSCULAR | Status: AC
  Filled 2023-06-23: qty 1

## 2023-06-23 MED ORDER — PROTAMINE SULFATE 10 MG/ML IV SOLN
INTRAVENOUS | Status: DC | PRN
  Administered 2023-06-23: 50 mg via INTRAVENOUS

## 2023-06-23 MED ORDER — PROPOFOL 500 MG/50ML IV EMUL
INTRAVENOUS | Status: DC | PRN
  Administered 2023-06-23: 100 ug/kg/min via INTRAVENOUS

## 2023-06-23 MED ORDER — LIDOCAINE HCL (PF) 1 % IJ SOLN
INTRAMUSCULAR | Status: AC
  Filled 2023-06-23: qty 30

## 2023-06-23 MED ORDER — OXYCODONE HCL 5 MG PO TABS: 5.0000 mg | ORAL_TABLET | Freq: Once | ORAL | Status: DC | PRN

## 2023-06-23 MED ORDER — HEPARIN 6000 UNIT IRRIGATION SOLUTION
Status: DC | PRN
  Administered 2023-06-23: 1

## 2023-06-23 MED ORDER — CEFAZOLIN SODIUM-DEXTROSE 2-4 GM/100ML-% IV SOLN
INTRAVENOUS | Status: AC
  Filled 2023-06-23: qty 100

## 2023-06-23 MED ORDER — CHLORHEXIDINE GLUCONATE 0.12 % MT SOLN: 15.0000 mL | Freq: Once | OROMUCOSAL | Status: AC

## 2023-06-23 MED ORDER — FENTANYL CITRATE (PF) 250 MCG/5ML IJ SOLN
INTRAMUSCULAR | Status: AC
  Filled 2023-06-23: qty 5

## 2023-06-23 SURGICAL SUPPLY — 46 items
ADH SKN CLS APL DERMABOND .7 (GAUZE/BANDAGES/DRESSINGS) ×2
ARMBAND PINK RESTRICT EXTREMIT (MISCELLANEOUS) ×2 IMPLANT
BAG COUNTER SPONGE SURGICOUNT (BAG) ×2 IMPLANT
BAG SPNG CNTER NS LX DISP (BAG) ×2
BNDG CMPR 5X4 KNIT ELC UNQ LF (GAUZE/BANDAGES/DRESSINGS) ×2
BNDG CMPR 9X4 STRL LF SNTH (GAUZE/BANDAGES/DRESSINGS) ×2
BNDG ELASTIC 4INX 5YD STR LF (GAUZE/BANDAGES/DRESSINGS) ×1 IMPLANT
BNDG ESMARK 4X9 LF (GAUZE/BANDAGES/DRESSINGS) ×1 IMPLANT
CANISTER SUCT 3000ML PPV (MISCELLANEOUS) ×2 IMPLANT
CATH EMB 4FR 40 (CATHETERS) ×1 IMPLANT
CLIP LIGATING EXTRA MED SLVR (CLIP) ×2 IMPLANT
CLIP LIGATING EXTRA SM BLUE (MISCELLANEOUS) ×2 IMPLANT
COVER PROBE W GEL 5X96 (DRAPES) ×2 IMPLANT
CUFF TOURN SGL QUICK 24 (TOURNIQUET CUFF) ×2
CUFF TRNQT CYL 24X4X16.5-23 (TOURNIQUET CUFF) ×1 IMPLANT
DERMABOND ADVANCED .7 DNX12 (GAUZE/BANDAGES/DRESSINGS) ×2 IMPLANT
ELECT REM PT RETURN 9FT ADLT (ELECTROSURGICAL) ×2
ELECTRODE REM PT RTRN 9FT ADLT (ELECTROSURGICAL) ×2 IMPLANT
GAUZE SPONGE 4X4 12PLY STRL (GAUZE/BANDAGES/DRESSINGS) ×1 IMPLANT
GLOVE BIO SURGEON STRL SZ7.5 (GLOVE) ×2 IMPLANT
GOWN STRL REUS W/ TWL LRG LVL3 (GOWN DISPOSABLE) ×4 IMPLANT
GOWN STRL REUS W/ TWL XL LVL3 (GOWN DISPOSABLE) ×2 IMPLANT
GOWN STRL REUS W/TWL LRG LVL3 (GOWN DISPOSABLE) ×4
GOWN STRL REUS W/TWL XL LVL3 (GOWN DISPOSABLE) ×2
KIT BASIN OR (CUSTOM PROCEDURE TRAY) ×2 IMPLANT
KIT TURNOVER KIT B (KITS) ×2 IMPLANT
NS IRRIG 1000ML POUR BTL (IV SOLUTION) ×2 IMPLANT
PACK CV ACCESS (CUSTOM PROCEDURE TRAY) ×2 IMPLANT
PAD ARMBOARD 7.5X6 YLW CONV (MISCELLANEOUS) ×4 IMPLANT
PAD CAST 4YDX4 CTTN HI CHSV (CAST SUPPLIES) ×1 IMPLANT
PADDING CAST COTTON 4X4 STRL (CAST SUPPLIES) ×2
PATCH VASC XENOSURE 1CMX6CM (Vascular Products) ×2 IMPLANT
PATCH VASC XENOSURE 1X6 (Vascular Products) ×1 IMPLANT
POWDER SURGICEL 3.0 GRAM (HEMOSTASIS) IMPLANT
SLING ARM FOAM STRAP LRG (SOFTGOODS) IMPLANT
SLING ARM FOAM STRAP MED (SOFTGOODS) IMPLANT
SUT ETHILON 3 0 PS 1 (SUTURE) IMPLANT
SUT MNCRL AB 4-0 PS2 18 (SUTURE) ×2 IMPLANT
SUT PROLENE 5 0 C 1 24 (SUTURE) ×3 IMPLANT
SUT PROLENE 6 0 BV (SUTURE) ×2 IMPLANT
SUT SILK 0 TIES 10X30 (SUTURE) ×2 IMPLANT
SUT VIC AB 3-0 SH 27 (SUTURE) ×2
SUT VIC AB 3-0 SH 27X BRD (SUTURE) ×2 IMPLANT
TOWEL GREEN STERILE (TOWEL DISPOSABLE) ×2 IMPLANT
UNDERPAD 30X36 HEAVY ABSORB (UNDERPADS AND DIAPERS) ×2 IMPLANT
WATER STERILE IRR 1000ML POUR (IV SOLUTION) ×2 IMPLANT

## 2023-06-23 NOTE — Op Note (Signed)
    NAME: [name] [name]    MRN: [id number] DOB: 02-11-1961    DATE OF OPERATION: 06/23/2023  PREOP DIAGNOSIS:    Pseudoaneurysm right brachiocephalic AV fistula  POSTOP DIAGNOSIS:    Same  PROCEDURE:    Excision of pseudoaneurysm right arm Bovine pericardial patch angioplasty right brachial artery  SURGEON: Di Kindle. Edilia Bo, MD  ASSIST: Clinton Gallant, PA  ANESTHESIA: Local with sedation  EBL: Minimal  INDICATIONS:    [name] is a 62 y.o. male who had undergone previous plication of a right brachiocephalic fistula because of high output.  He has a functioning transplant.  He presented with a mass at the antecubital level and was set up for ligation of his fistula.  FINDINGS:   There was a large pseudoaneurysm of the proximal fistula.  This was excised.  The distal fistula was oversewn with a 5-0 Prolene.  The brachial artery was repaired with a bovine pericardial patch  TECHNIQUE:   The patient was taken to the operating room and the right arm was prepped and draped in usual sterile fashion.  The patient had been sedated.  The skin was anesthetized with 1% lidocaine with epinephrine.  A fishmouth incision was made encompassing this large pseudoaneurysm.  This was dissected free.  There was significant scar tissue.  I elected to use a tourniquet.  The patient was heparinized.  A tourniquet was placed on the upper arm.  The arm was exsanguinated with an Esmarch bandage and the tourniquet inflated to 250 mmHg.  Under tourniquet control I open the large pseudoaneurysm which was complex.  I dissected out the fistula distally which and this was oversewn with a 5-0 Prolene.  This allowed identification of the brachial artery.  I tracked the proximal fistula down to the brachial artery and the old fistula was excised.  The defect in the brachial artery was repaired with a bovine pericardial patch which was sewn with 5-0 Prolene suture.  Prior to completing anastomosis the  tourniquet was released.  The artery was backbled and flushed and the anastomosis completed.  Hemostasis was obtained in the wound.  The wound was then closed with 2 deep layers of 3-0 Vicryl and the skin closed with 4-0 Monocryl.  Dermabond was applied.  Patient tolerated the procedure well was transferred to the recovery room in stable condition.  All needle and sponge counts were correct.  Given the complexity of the case a first assistant was necessary in order to expedient the procedure and safely perform the technical aspects of the operation.  Waverly Ferrari, MD, FACS [facility]  DATE OF DICTATION:   06/23/2023

## 2023-06-23 NOTE — Interval H&P Note (Signed)
History and Physical Interval Note:  06/23/2023 2:42 PM  [name] [name]  has presented today for surgery, with the diagnosis of ESRD.  The various methods of treatment have been discussed with the patient and family. After consideration of risks, benefits and other options for treatment, the patient has consented to  Procedure(s): REVISION OF RIGHT ARTERIOVENOUS FISTULA (Right) LIGATION OF ARTERIOVENOUS  FISTULA (Right) as a surgical intervention.  The patient's history has been reviewed, patient examined, no change in status, stable for surgery.  I have reviewed the patient's chart and labs.  Questions were answered to the patient's satisfaction.     Waverly Ferrari

## 2023-06-23 NOTE — Transfer of Care (Signed)
Immediate Anesthesia Transfer of Care Note  Patient: SERVANDO KYLLONEN  Procedure(s) Performed: EXCISION OF PSEUDOANEURYSM RIGHT ARTERIOVENOUS FISTULA (Right: Arm Upper) PATCH ANGIOPLASTY USING 1CM X 6CM XENOSURE BIOLOGIC PATCH (Right: Arm Upper)  Patient Location: PACU  Anesthesia Type:MAC  Level of Consciousness: awake and alert   Airway & Oxygen Therapy: Patient Spontanous Breathing and Patient connected to face mask oxygen  Post-op Assessment: Report given to RN and Post -op Vital signs reviewed and stable  Post vital signs: Reviewed and stable  Last Vitals:  Vitals Value Taken Time  BP 150/93 06/23/23 1723  Temp 36.1 C 06/23/23 1723  Pulse 54 06/23/23 1726  Resp 16 06/23/23 1723  SpO2 99 % 06/23/23 1726  Vitals shown include unfiled device data.  Last Pain:  Vitals:   06/23/23 1245  TempSrc:   PainSc: 0-No pain         Complications: No notable events documented.

## 2023-06-23 NOTE — Discharge Instructions (Signed)
Keep right UR ace wrap dressing on for 48 hours.  You may shower daily once dressing is removed.  Gradually use the right UE for daily activities as tolerates until f/u.

## 2023-06-24 ENCOUNTER — Encounter (HOSPITAL_COMMUNITY): Payer: Self-pay | Admitting: Vascular Surgery

## 2023-06-24 LAB — POCT I-STAT, CHEM 8
BUN: 25 mg/dL — ABNORMAL HIGH (ref 8–23)
Calcium, Ion: 1.21 mmol/L (ref 1.15–1.40)
Chloride: 107 mmol/L (ref 98–111)
Creatinine, Ser: 2.1 mg/dL — ABNORMAL HIGH (ref 0.61–1.24)
Glucose, Bld: 120 mg/dL — ABNORMAL HIGH (ref 70–99)
HCT: 40 % (ref 39.0–52.0)
Hemoglobin: 13.6 g/dL (ref 13.0–17.0)
Potassium: 4.5 mmol/L (ref 3.5–5.1)
Sodium: 141 mmol/L (ref 135–145)
TCO2: 27 mmol/L (ref 22–32)

## 2023-06-24 NOTE — Anesthesia Postprocedure Evaluation (Signed)
Anesthesia Post Note  Patient: [name] [name]  Procedure(s) Performed: EXCISION OF PSEUDOANEURYSM RIGHT ARTERIOVENOUS FISTULA (Right: Arm Upper) PATCH ANGIOPLASTY USING 1CM X 6CM XENOSURE BIOLOGIC PATCH (Right: Arm Upper)     Patient location during evaluation: PACU Anesthesia Type: MAC Level of consciousness: awake and alert Pain management: pain level controlled Vital Signs Assessment: post-procedure vital signs reviewed and stable Respiratory status: spontaneous breathing, nonlabored ventilation and respiratory function stable Cardiovascular status: stable, blood pressure returned to baseline and bradycardic Anesthetic complications: no   There were no known notable events for this encounter.  Last Vitals:  Vitals:   06/23/23 1730 06/23/23 1745  BP: (!) 160/95 (!) 157/89  Pulse: (!) 52 (!) 51  Resp: 16 15  Temp:  (!) 36.2 C  SpO2: 97% 98%    Last Pain:  Vitals:   06/23/23 1745  TempSrc:   PainSc: 0-No pain   Pain Goal:                   Beryle Lathe

## 2023-06-29 ENCOUNTER — Other Ambulatory Visit: Payer: Self-pay | Admitting: Cardiovascular Disease

## 2023-06-30 NOTE — Progress Notes (Unsigned)
Electrophysiology Office Follow up Visit Note:    Date:  07/01/2023   ID:  [name] [name], DOB 1961/04/07, MRN [id number]  PCP:  Dana Allan, MD  [facility] Cardiologist:  Charlton Haws, MD  [facility] Electrophysiologist:  Lanier Prude, MD    Interval History:    [name] [name] is a 62 y.o. male who presents for a follow up visit.   The patient had an A-fib ablation on September 25, 2022.  During the procedure, the veins were isolated. The patient has a long history of shortness of breath.  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  He is reported continued shortness of breath after his catheter ablation.  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 Right heart catheterization was performed to further investigate the cause of his shortness of breath.  The right heart catheterization showed a significantly elevated cardiac output consistent with high-output heart failure.  This led to a banding procedure of his AV fistula which was not successful in reducing flow.  He has subsequently gone on to have his AV fistula removed in its entirety.  Today he presents for follow-up  He is with his wife today in follow-up wife previously met.  He is healing well from his fistula removal on his right upper extremity.  He is optimistic about his recovery without.  He remains very active.  He has mowed 3 to 4 yards in the last week.  He did feel very fatigued the last 2 days recovering from that work.  He reports shortness of breath with exertion during that time but still is able to complete those manual labor tasks.     Past medical, surgical, social and family history were reviewed.  ROS:   Please see the history of present illness.    All other systems reviewed and are negative.  EKGs/Labs/Other Studies Reviewed:    The following studies were reviewed today:  I reviewed chest x-ray films  with the patient and his wife showing his hemidiaphragm elevation on the right side from 2015.  We reviewed CT scans.  EKG Interpretation Date/Time:  Wednesday July 01 2023 13:30:27 EDT Ventricular Rate:  69 PR Interval:  142 QRS Duration:  82 QT Interval:  410 QTC Calculation: 439 R Axis:   42  Text Interpretation: Normal sinus rhythm Normal ECG Confirmed by Steffanie Dunn [phone]) on 07/01/2023 1:35:46 PM     Physical Exam:    VS:  BP 130/86 (BP Location: Left Arm, Patient Position: Sitting, Cuff Size: Large)   Pulse 69   Ht 5\' 10"  (1.778 m)   Wt 256 lb 3.2 oz (116.2 kg)   SpO2 96%   BMI 36.76 kg/m     Wt Readings from Last 3 Encounters:  07/01/23 256 lb 3.2 oz (116.2 kg)  06/23/23 259 lb (117.5 kg)  06/17/23 259 lb (117.5 kg)     GEN:  Well nourished, well developed in no acute distress CARDIAC: RRR, no murmurs, rubs, gallops.  Right upper extremity incision healing well.  Mild redness around the site that is improving according to the patient his wife. RESPIRATORY:  Clear to auscultation without rales, wheezing or rhonchi       ASSESSMENT:    1. Dyspnea and respiratory abnormalities   2. High output congestive heart failure (HCC)   3. Atrial fibrillation, unspecified type (HCC)   4. Persistent atrial fibrillation (HCC)  PLAN:    In order of problems listed above:  #Atrial fibrillation Maintaining sinus rhythm after his October 2023 catheter ablation.  Continue Xarelto for stroke prophylaxis.  #Shortness of breath Chronic issue.  Unclear cause.  Differential would include his chronic right hemidiaphragm paralysis, high-output heart failure as documented on recent right heart catheterization, pulmonary arterial hypertension.  Also on the differential would be pulmonary vein narrowing/stenosis.  This would be difficult to evaluate given his abnormal kidney function.  There are no supportive findings on CT scan of pulm vein stenosis (atelectasis, pleural  effusions).  If he were to have pulm vein stenosis, I would not intervene with open heart surgery given morbidity associate with the procedure.  Stenting has poor outcomes.  Follow-up 6 mo w me.     Signed, Steffanie Dunn, MD, [facility], [facility] 07/01/2023 2:06 PM    Electrophysiology [facility]

## 2023-07-01 ENCOUNTER — Encounter: Payer: Self-pay | Admitting: Cardiology

## 2023-07-01 ENCOUNTER — Ambulatory Visit: Payer: PPO | Admitting: Cardiology

## 2023-07-01 VITALS — BP 130/86 | HR 69 | Ht 70.0 in | Wt 256.2 lb

## 2023-07-01 DIAGNOSIS — I5083 High output heart failure: Secondary | ICD-10-CM | POA: Diagnosis not present

## 2023-07-01 DIAGNOSIS — R0600 Dyspnea, unspecified: Secondary | ICD-10-CM | POA: Diagnosis not present

## 2023-07-01 DIAGNOSIS — I4891 Unspecified atrial fibrillation: Secondary | ICD-10-CM | POA: Diagnosis not present

## 2023-07-01 DIAGNOSIS — I4819 Other persistent atrial fibrillation: Secondary | ICD-10-CM

## 2023-07-01 DIAGNOSIS — R0689 Other abnormalities of breathing: Secondary | ICD-10-CM | POA: Diagnosis not present

## 2023-07-01 NOTE — Patient Instructions (Signed)
Medication Instructions:  Your physician recommends that you continue on your current medications as directed. Please refer to the Current Medication list given to you today.  *If you need a refill on your cardiac medications before your next appointment, please call your pharmacy*  Follow-Up: At [facility], you and your health needs are our priority.  As part of our continuing mission to provide you with exceptional heart care, we have created designated Provider Care Teams.  These Care Teams include your primary Cardiologist (physician) and Advanced Practice Providers (APPs -  Physician Assistants and Nurse Practitioners) who all work together to provide you with the care you need, when you need it.  Your next appointment:   6 month(s)  Provider:   Cameron Lambert, MD    

## 2023-07-08 ENCOUNTER — Ambulatory Visit (INDEPENDENT_AMBULATORY_CARE_PROVIDER_SITE_OTHER): Payer: PPO | Admitting: Physician Assistant

## 2023-07-08 VITALS — BP 160/97 | HR 54 | Temp 98.4°F | Wt 258.0 lb

## 2023-07-08 DIAGNOSIS — N186 End stage renal disease: Secondary | ICD-10-CM

## 2023-07-08 NOTE — Progress Notes (Signed)
  POST OPERATIVE OFFICE NOTE    CC:  F/u for surgery  HPI:  This is a 62 y.o. 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 Edilia Bo on 06/23/2023.  This was performed due to thinning skin overlying aneurysmal area, and high output heart failure.  Patient believes the incision is healing well.  He denies any ischemic symptoms of the right hand.  He has a functioning transplanted kidney.  No Known Allergies  Current Outpatient Medications  Medication Sig Dispense Refill   atorvastatin (LIPITOR) 40 MG tablet Take 1 tablet (40 mg total) by mouth daily. 90 tablet 1   diphenhydramine-acetaminophen (TYLENOL PM) 25-500 MG TABS tablet Take 2 tablets by mouth at bedtime.     ENVARSUS XR 1 MG TB24 Take 5 mg by mouth in the morning.     furosemide (LASIX) 40 MG tablet Take 20 mg by mouth daily. Prescribed 01/30/23 Dr Margretta Sidle (nephrology)     magnesium oxide (MAG-OX) 400 MG tablet Take 400 mg by mouth 2 (two) times daily. Lunch and supper     Menthol (BIOFREEZE) 10.5 % AERO Apply 1 spray topically daily as needed (shoulder pain.).     metoprolol succinate (TOPROL-XL) 25 MG 24 hr tablet Take 1 tablet (25 mg total) by mouth 2 (two) times daily. 60 tablet 3   mycophenolate (MYFORTIC) 180 MG EC tablet Take 540 mg by mouth 2 (two) times daily.     omeprazole (PRILOSEC) 20 MG capsule Take 20 mg by mouth daily as needed (acid reflux).     oxyCODONE-acetaminophen (PERCOCET) 5-325 MG tablet Take 1 tablet by mouth every 4 (four) hours as needed for severe pain. 20 tablet 0   predniSONE (DELTASONE) 5 MG tablet Take 5 mg by mouth in the morning.     sulfamethoxazole-trimethoprim (BACTRIM) 400-80 MG tablet Take 1 tablet by mouth every Monday, Wednesday, and Friday. In the morning     XARELTO 20 MG TABS tablet Take 1 tablet (20 mg total) by mouth daily with supper. 90 tablet 3   Current Facility-Administered Medications   Medication Dose Route Frequency Provider Last Rate Last Admin   sodium chloride flush (NS) 0.9 % injection 3 mL  3 mL Intravenous Q12H Wendall Stade, MD         ROS:  See HPI  Physical Exam:  Vitals:   07/08/23 1338  BP: (!) 160/97  Pulse: (!) 54  Temp: 98.4 F (36.9 C)  TempSrc: Temporal  SpO2: 92%  Weight: 258 lb (117 kg)    Incision: Incision in the right AC fossa appears to be healing well Extremities: 2+ right radial pulse; no palpable flow through the ligated fistula in the right upper arm Neuro: A&O  Assessment/Plan:  This is a 62 y.o. 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  There is no detectable flow through the ligated fistula in the upper arm.  The incision appears to be healing well.  Recommended patient use a warm compress to speed reabsorption of the blood remaining in the ligated fistula.  Okay to cleanse the wound with soap and water on a daily basis then pat dry.  He will follow-up on an as-needed basis.   Emilie Rutter, PA-C [facility] [phone]  Clinic MD:  Edilia Bo

## 2023-07-13 ENCOUNTER — Other Ambulatory Visit: Payer: Self-pay | Admitting: Cardiovascular Disease

## 2023-07-24 LAB — HEMOGLOBIN A1C: Hemoglobin A1C: 6.7

## 2023-08-26 ENCOUNTER — Ambulatory Visit (HOSPITAL_COMMUNITY)
Admission: RE | Admit: 2023-08-26 | Discharge: 2023-08-26 | Disposition: A | Discharge status: Home / Self Care | Payer: PPO | Source: Ambulatory Visit | Attending: Cardiology | Admitting: Cardiology

## 2023-08-26 ENCOUNTER — Encounter (HOSPITAL_COMMUNITY): Payer: Self-pay | Admitting: Cardiology

## 2023-08-26 VITALS — BP 130/80 | HR 55 | Wt 258.0 lb

## 2023-08-26 DIAGNOSIS — I5083 High output heart failure: Secondary | ICD-10-CM

## 2023-08-26 DIAGNOSIS — I5030 Unspecified diastolic (congestive) heart failure: Secondary | ICD-10-CM | POA: Diagnosis present

## 2023-08-26 NOTE — Progress Notes (Signed)
ADVANCED HEART FAILURE CLINIC NOTE  Referring Physician: Dana Allan, MD  Primary Care: Dana Allan, MD Primary Cardiologist: Dr. Eden Emms  HPI: [name] [name] is a 62 y.o. 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  His cardiac history dates back to at least 2023 when he was diagnosed with atrial fibrillation and had an AF ablation.  His history of ESRD dates back to at least June 2017.  In 2020 he had a renal transplant and currently [facility].  Since that time he has been followed by Dr. Eden Emms.  Due to persistent shortness of breath I had a performed a right heart catheterization on him which was consistent with high-output heart failure.  During the cath we inflated a blood pressure cuff over the fistula leading to improvement in outputs.  Since that time he has had his AV fistula banded by Dr. Randie Heinz on 03/31/2023.   Interval hx:  Since ligation of his AV fistula he reports significant improvement in dyspnea, fatigue and exertional capacity.  He he can now perform all ADLs without difficulty.  Reports that initially he had trouble bending over and even tying his shoes due to severe shortness of breath which is now completely resolved.   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 by 02/26/23 TTE   Atrial fibrillation (HCC) 11/28/2016   Cataract     bilateral- right removed, left present   Cervical radiculopathy 03/01/2020   Cherry angioma 05/01/2020   Chest pain syndrome 01/26/2020   Chronic diastolic CHF (congestive heart failure) (HCC)    Complication of anesthesia    problem with bladder not waking up after surgery-for right nephrectomy   Dermatofibroma 05/01/2020   Dry skin 05/01/2020   Dyspnea    Dysrhythmia    Elevated hemidiaphragm 02/16/2023   right   Encounter for adequacy testing for dialysis (HCC) 09/18/2014   Enlarged kidney    right   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   Folliculitis 05/01/2020   GERD without esophagitis 12/05/2020   Gout    HLD (hyperlipidemia) 11/28/2016   Hypertension    Hypokalemia 11/28/2016   Hypomagnesemia 01/30/2020   Immunosuppressive management encounter following kidney transplant 01/25/2019   Lapband APS July 2015 06/20/2014   Left shoulder pain 06/14/2020   Mixed hyperlipidemia 11/13/2014   Morbid obesity BMI 42 01/04/2014   Multiple benign nevi 05/01/2020   S/P repair of ventral hernia 06/21/2014   Sebaceous hyperplasia 05/01/2020   Seborrheic keratoses 05/01/2020   Serum total bilirubin elevated 01/30/2020   Status post gastric banding 06/20/2014   Status post laparoscopic cholecystectomy August 2016 07/13/2015   Status post living-donor kidney transplantation 01/17/2019   Sun-damaged skin 05/01/2020   TIA (transient ischemic attack) 07/14/2017   Vertigo     Current Outpatient Medications  Medication Sig Dispense Refill   atorvastatin (LIPITOR) 40 MG tablet Take 1 tablet (40 mg total) by mouth daily. 90 tablet 3  diphenhydramine-acetaminophen (TYLENOL PM) 25-500 MG TABS tablet Take 2 tablets by mouth at bedtime.     ENVARSUS XR 1 MG TB24 Take 5 mg by mouth in the morning.     furosemide (LASIX) 40 MG tablet Take 20 mg by mouth daily. Prescribed 01/30/23 Dr Margretta Sidle (nephrology)     LISINOPRIL PO Take 1 tablet by mouth daily.     magnesium oxide (MAG-OX) 400 MG tablet Take  400 mg by mouth 2 (two) times daily. Lunch and supper     Menthol (BIOFREEZE) 10.5 % AERO Apply 1 spray topically daily as needed (shoulder pain.).     metoprolol succinate (TOPROL-XL) 25 MG 24 hr tablet Take 1 tablet (25 mg total) by mouth 2 (two) times daily. 60 tablet 3   mycophenolate (MYFORTIC) 180 MG EC tablet Take 540 mg by mouth 2 (two) times daily.     omeprazole (PRILOSEC) 20 MG capsule Take 20 mg by mouth daily as needed (acid reflux).     predniSONE (DELTASONE) 5 MG tablet Take 5 mg by mouth in the morning.     sulfamethoxazole-trimethoprim (BACTRIM) 400-80 MG tablet Take 1 tablet by mouth every Monday, Wednesday, and Friday. In the morning     XARELTO 20 MG TABS tablet Take 1 tablet (20 mg total) by mouth daily with supper. 90 tablet 3   Current Facility-Administered Medications  Medication Dose Route Frequency Provider Last Rate Last Admin   sodium chloride flush (NS) 0.9 % injection 3 mL  3 mL Intravenous Q12H Wendall Stade, MD        No Known Allergies    Social History   Socioeconomic History   Marital status: Married    Spouse name: Not on file   Number of children: 12   Years of education: 2   Highest education level: Not on file  Occupational History   Occupation: Retired  Tobacco Use   Smoking status: Never    Passive exposure: Past   Smokeless tobacco: Never  Vaping Use   Vaping status: Never Used  Substance and Sexual Activity   Alcohol use: No    Alcohol/week: 0.0 standard drinks of alcohol   Drug use: No   Sexual activity: Not on file  Other Topics Concern   Not on file  Social History Narrative   Lives at home with his wife.   Right-handed.   No caffeine use.   Social Determinants of Health   Financial Resource Strain: Not on file  Food Insecurity: Low Risk  (07/24/2023)   Received from [facility]   Hunger Vital Sign    Worried About Running Out of Food in the Last Year: Never true    Ran Out of Food in the Last Year: Never true   Transportation Needs: No Transportation Needs (07/24/2023)   Received from [facility]    In the past 12 months, has lack of reliable transportation kept you from medical appointments, meetings, work or from getting things needed for daily living? : No  Physical Activity: Not on file  Stress: Not on file  Social Connections: Not on file  Intimate Partner Violence: Not on file      Family History  Problem Relation Age of Onset   Hyperlipidemia Mother    Hypertension Mother    Heart disease Mother        before age 66   Dementia Mother    Early death Father    Cancer Father  bone   Arthritis Sister    Cancer Sister        breast   Drug abuse Sister    Depression Sister    Alcohol abuse Sister    Cancer Sister        breast   CAD Other    Colon cancer Neg Hx    Rectal cancer Neg Hx    Stomach cancer Neg Hx     PHYSICAL EXAM: Vitals:   08/26/23 1432  BP: 130/80  Pulse: (!) 55  SpO2: 97%   GENERAL: Well nourished, well developed, and in no apparent distress at rest.  HEENT: Negative for arcus senilis or xanthelasma. There is no scleral icterus.  The mucous membranes are pink and moist.   NECK: Supple, No masses. Normal carotid upstrokes without bruits. No masses or thyromegaly.    CHEST: There are no chest wall deformities. There is no chest wall tenderness. Respirations are unlabored.  Lungs- CTA B/L CARDIAC:  JVP: 7 cm H2O         Normal S1, S2  Normal rate with regular rhythm. No murmurs, rubs or gallops.  Pulses are 2+ and symmetrical in upper and lower extremities. No edema.  ABDOMEN: Soft, non-tender, non-distended. There are no masses or hepatomegaly. There are normal bowel sounds.  EXTREMITIES: Warm and well perfused with no cyanosis, clubbing.  LYMPHATIC: No axillary or supraclavicular lymphadenopathy.  NEUROLOGIC: Patient is oriented x3 with no focal or lateralizing neurologic deficits.  PSYCH: Patients affect is appropriate, there  is no evidence of anxiety or depression.  SKIN: Warm and dry; no lesions or wounds.   DATA REVIEW  ECG: 07/01/23: NSR  As per my personal interpretation  ECHO: 02/26/23: LVEF 60-65% with normal RV function. RVSp .  As per my personal interpretation  CATH: HEMODYNAMICS: RA:                  6 mmHg (mean) RV:                  50/10 mmHg PA:                  54/14 mmHg (35 mean) PCWP:            15-20 with V waves (difficulty completing occluding for PCWP)                                       Thermodilution CO/CI 12.7 L/min, 5.7 L/min/m2 PA sat: 73-74%            NIBP: 147/70                                      TPG                 15  mmHg                                            PVR                 1.2 Wood Units  PAPi                6.7     Hemodynamics after  inflating cuff to 120-130 systolic PA: 42/10 (33) Thermodilution: 9.68 L/min, 4.18 L/min/m2   IMPRESSION: Normal pre and post capillary filling pressures Moderately elevated PA mean with normal PVR likely secondary to high output.  Elevated cardiac output / cardiac index consistent with high output heart failure secondary to AVF.  After inflation of manual cuff to 120-130 systolic for 3 minutes there was a reduction in PA systolic and a reduction in thermodilution cardiac output by 3 L/min.   ASSESSMENT & PLAN:  High output heart failure - Initially referred for RHC by Dr. Eden Emms. RHC as above with output of 12.7L/min by thermodilution. Improved by 30% after inflating cuff.  - Reports significant improvement in dyspnea now after ligation and excision of fistula..  - Plan on repeat TTE to re-assess LV/RV function. Doing very well from a functional standpoint now. - AVF banding on 03/31/23. As per op report, fistula was very large and banded around a 6mm balloon leading to only mild distal flow. - Presented with large pseudoaneurysm of the proximal fistula that is now s/p complete excision on  06/23/23.   2. Atrial fibrillation - Followed by Dr. Lalla Brothers.  - Has remained in atrial fibrillation since 09/23/23.   3. ESRD s/p renal transplant - followed by Dr. Jean Rosenthal at [facility] on 01/13/19   Chrisann Melaragno Advanced Heart Failure Mechanical Circulatory Support

## 2023-08-26 NOTE — Patient Instructions (Signed)
There has been no changes to your medications.  Your physician has requested that you have an echocardiogram. Echocardiography is a painless test that uses sound waves to create images of your heart. It provides your doctor with information about the size and shape of your heart and how well your heart's chambers and valves are working. This procedure takes approximately one hour. There are no restrictions for this procedure. Please do NOT wear cologne, perfume, aftershave, or lotions (deodorant is allowed). Please arrive 15 minutes prior to your appointment time.  Your physician recommends that you schedule a follow-up appointment in: 4 months ( March 2025) **PLEASE CALL THE OFFICE IN [place] TO ARRANGE YOUR FOLLOW UP APPOINTMENT. **  If you have any questions or concerns before your next appointment please send [place] a message through [place] or call our office at [phone].    TO LEAVE A MESSAGE FOR THE NURSE SELECT OPTION 2, PLEASE LEAVE A MESSAGE INCLUDING: YOUR NAME DATE OF BIRTH CALL BACK NUMBER REASON FOR CALL**this is important as we prioritize the call backs  YOU WILL RECEIVE A CALL BACK THE SAME DAY AS LONG AS YOU CALL BEFORE 4:00 PM  At the [facility], you and your health needs are our priority. As part of our continuing mission to provide you with exceptional heart care, we have created designated Provider Care Teams. These Care Teams include your primary Cardiologist (physician) and Advanced Practice Providers (APPs- Physician Assistants and Nurse Practitioners) who all work together to provide you with the care you need, when you need it.   You may see any of the following providers on your designated Care Team at your next follow up: Dr Arvilla Meres Dr Marca Ancona Dr. Marcos Eke, NP Robbie Lis, [place] [facility] [place], [place] Brynda Peon, NP Karle Plumber, PharmD   Please be sure to bring in all your medications  bottles to every appointment.    Thank you for choosing [facility]

## 2023-09-08 ENCOUNTER — Ambulatory Visit: Payer: PPO | Admitting: Dermatology

## 2023-09-08 ENCOUNTER — Encounter: Payer: Self-pay | Admitting: Dermatology

## 2023-09-08 VITALS — BP 127/79 | HR 69

## 2023-09-08 DIAGNOSIS — L570 Actinic keratosis: Secondary | ICD-10-CM

## 2023-09-08 DIAGNOSIS — Z5111 Encounter for antineoplastic chemotherapy: Secondary | ICD-10-CM

## 2023-09-08 DIAGNOSIS — D0462 Carcinoma in situ of skin of left upper limb, including shoulder: Secondary | ICD-10-CM

## 2023-09-08 DIAGNOSIS — D1801 Hemangioma of skin and subcutaneous tissue: Secondary | ICD-10-CM

## 2023-09-08 DIAGNOSIS — W908XXA Exposure to other nonionizing radiation, initial encounter: Secondary | ICD-10-CM

## 2023-09-08 DIAGNOSIS — D0439 Carcinoma in situ of skin of other parts of face: Secondary | ICD-10-CM

## 2023-09-08 DIAGNOSIS — L821 Other seborrheic keratosis: Secondary | ICD-10-CM

## 2023-09-08 DIAGNOSIS — L814 Other melanin hyperpigmentation: Secondary | ICD-10-CM | POA: Diagnosis not present

## 2023-09-08 DIAGNOSIS — D485 Neoplasm of uncertain behavior of skin: Secondary | ICD-10-CM

## 2023-09-08 DIAGNOSIS — L578 Other skin changes due to chronic exposure to nonionizing radiation: Secondary | ICD-10-CM

## 2023-09-08 DIAGNOSIS — D492 Neoplasm of unspecified behavior of bone, soft tissue, and skin: Secondary | ICD-10-CM | POA: Diagnosis not present

## 2023-09-08 DIAGNOSIS — Z1283 Encounter for screening for malignant neoplasm of skin: Secondary | ICD-10-CM

## 2023-09-08 DIAGNOSIS — D229 Melanocytic nevi, unspecified: Secondary | ICD-10-CM

## 2023-09-08 DIAGNOSIS — Z940 Kidney transplant status: Secondary | ICD-10-CM

## 2023-09-08 MED ORDER — FLUOROURACIL 5 % EX CREA: TOPICAL_CREAM | CUTANEOUS | 2 refills | Status: DC

## 2023-09-08 NOTE — Patient Instructions (Addendum)
Efudex Instructions 1. Apply a thin layer of Efudex cream to the specified sun  damaged area twice a day. The medication is to be applied to the entire  area, not just the keratoses (rough spots). Wash hands thoroughly after  application! 2. Use Efudex twice daily for 2 weeks to 4 weeks (depending on the  physician directions) or once a day for 4 weeks (depending on the  physician directions).  3. The skin will become red and scaly. This is the appropriate response.  4. Once redness starts, you may begin using Hydrocortisone 1% Cream (or  other topical steroid provided by the physician) twice a day. This helps  relieve pain and will speed the healing process. It may be applied as soon  as 30 minutes after Efudex application. 5. For excessive reactions or if suspicious for an infection or allergic  reaction, please call to make an appointment at my office so that I may  evaluate your response to treatment. 6. The area may be washed as you normally would with soap and water. Do  not wash the face for at least three hours after Efudex  application. 7. Ladies: Chrystie Nose is permitted throughout the entire treatment period. Wait  30 minutes after Efudex use before applying makeup directly on top of the  medication. 8. Please read the instructional pamphlet provided by the [employer] so that you may familiarize yourself with the kinds of reactions  you may experience. 9. If you have had a biopsy on any area that you will use the medication on,  DO NOT start the Efudex until your results are given to you and  the medical assistant lets you know it is okay to start the treatment    Patient Handout: Wound Care for Skin Biopsy Site  Taking Care of Your Skin Biopsy Site  Proper care of the biopsy site is essential for promoting healing and minimizing scarring. This handout provides instructions on how to care for your biopsy site to ensure optimal recovery.  1. Cleaning the  Wound:  Clean the biopsy site daily with gentle soap and water. Gently pat the area dry with a clean, soft towel. Avoid harsh scrubbing or rubbing the area, as this can irritate the skin and delay healing.  2. Applying Aquaphor and Bandage:  After cleaning the wound, apply a thin layer of Aquaphor ointment to the biopsy site. Cover the area with a sterile bandage to protect it from dirt, bacteria, and friction. Change the bandage daily or as needed if it becomes soiled or wet.  3. Continued Care for One Week:  Repeat the cleaning, Aquaphor application, and bandaging process daily for one week following the biopsy procedure. Keeping the wound clean and moist during this initial healing period will help prevent infection and promote optimal healing.  4. Massaging Aquaphor into the Area:  ---After one week, discontinue the use of bandages but continue to apply Aquaphor to the biopsy site. ----Gently massage the Aquaphor into the area using circular motions. ---Massaging the skin helps to promote circulation and prevent the formation of scar tissue.   Additional Tips:  Avoid exposing the biopsy site to direct sunlight during the healing process, as this can cause hyperpigmentation or worsen scarring. If you experience any signs of infection, such as increased redness, swelling, warmth, or drainage from the wound, contact your healthcare provider immediately. Follow any additional instructions provided by your healthcare provider for caring for the biopsy site and managing any discomfort. Conclusion:  Taking proper care of your skin biopsy site is crucial for ensuring optimal healing and minimizing scarring. By following these instructions for cleaning, applying Aquaphor, and massaging the area, you can promote a smooth and successful recovery. If you have any questions or concerns about caring for your biopsy site, don't hesitate to contact your healthcare provider for guidance.      Skin Education : We counseled the patient regarding the following: Sun screen (SPF 30 or greater) should be applied during peak UV exposure (between 10am and 2pm) and reapplied after exercise or swimming.  The ABCDEs of melanoma were reviewed with the patient, and the importance of monthly self-examination of moles was emphasized. Should any moles change in shape or color, or itch, bleed or burn, pt will contact our office for evaluation sooner then their interval appointment.  Plan: Sunscreen Recommendations We recommended a broad spectrum sunscreen with a SPF of 30 or higher.  SPF 30 sunscreens block approximately 97 percent of the sun's harmful rays. Sunscreens should be applied at least 15 minutes prior to expected sun exposure and then every 2 hours after that as long as sun exposure continues. If swimming or exercising sunscreen should be reapplied every 45 minutes to an hour after getting wet or sweating. One ounce, or the equivalent of a shot glass full of sunscreen, is adequate to protect the skin not covered by a bathing suit. We also recommended a lip balm with a sunscreen as well. Sun protective clothing can be used in lieu of sunscreen but must be worn the entire time you are exposed to the sun's rays.   Important Information   Due to recent changes in healthcare laws, you may see results of your pathology and/or laboratory studies on MyChart before the doctors have had a chance to review them. We understand that in some cases there may be results that are confusing or concerning to you. Please understand that not all results are received at the same time and often the doctors may need to interpret multiple results in order to provide you with the best plan of care or course of treatment. Therefore, we ask that you please give [place] 2 business days to thoroughly review all your results before contacting the office for clarification. Should we see a critical lab result, you will be  contacted sooner.     If You Need Anything After Your Visit   If you have any questions or concerns for your doctor, please call our main line at [phone]. If no one answers, please leave a voicemail as directed and we will return your call as soon as possible. Messages left after 4 pm will be answered the following business day.    You may also send [place] a message via MyChart. We typically respond to MyChart messages within 1-2 business days.  For prescription refills, please ask your pharmacy to contact our office. Our fax number is [phone].  If you have an urgent issue when the clinic is closed that cannot wait until the next business day, you can page your doctor at the number below.     Please note that while we do our best to be available for urgent issues outside of office hours, we are not available 24/7.    If you have an urgent issue and are unable to reach [place], you may choose to seek medical care at your doctor's office, retail clinic, urgent care center, or emergency room.   If you have a medical emergency,  please immediately call 911 or go to the emergency department. In the event of inclement weather, please call our main line at [phone] for an update on the status of any delays or closures.  Dermatology Medication Tips: Please keep the boxes that topical medications come in in order to help keep track of the instructions about where and how to use these. Pharmacies typically print the medication instructions only on the boxes and not directly on the medication tubes.   If your medication is too expensive, please contact our office at [phone] or send [place] a message through MyChart.    We are unable to tell what your co-pay for medications will be in advance as this is different depending on your insurance coverage. However, we may be able to find a substitute medication at lower cost or fill out paperwork to get insurance to cover a needed medication.    If a  prior authorization is required to get your medication covered by your insurance company, please allow [place] 1-2 business days to complete this process.   Drug prices often vary depending on where the prescription is filled and some pharmacies may offer cheaper prices.   The website www.goodrx.com contains coupons for medications through different pharmacies. The prices here do not account for what the cost may be with help from insurance (it may be cheaper with your insurance), but the website can give you the price if you did not use any insurance.  - You can print the associated coupon and take it with your prescription to the pharmacy.  - You may also stop by our office during regular business hours and pick up a GoodRx coupon card.  - If you need your prescription sent electronically to a different pharmacy, notify our office through [facility] or by phone at [phone]

## 2023-09-08 NOTE — Progress Notes (Signed)
New Patient Visit   Subjective  [name] [name] is a 62 y.o. male who presents for the following: Skin Cancer Screening and Full Body Skin Exam  The patient presents for Total-Body Skin Exam (TBSE) for skin cancer screening and mole check. The patient has spots, moles and lesions to be evaluated, some may be new or changing and the patient may have concern these could be cancer.  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 Pt has no hx of skin cancer. Pt has family hx of skin cancer.  The following portions of the chart were reviewed this encounter and updated as appropriate: medications, allergies, medical history  Review of Systems:  No other skin or systemic complaints except as noted in HPI or Assessment and Plan.  Objective  Well appearing patient in no apparent distress; mood and affect are within normal limits.  A full examination was performed including scalp, head, eyes, ears, nose, lips, neck, chest, axillae, abdomen, back, buttocks, bilateral upper extremities, bilateral lower extremities, hands, feet, fingers, toes, fingernails, and toenails. All findings within normal limits unless otherwise noted below.   Relevant physical exam findings are noted in the Assessment and Plan.  left temple Pink papule       Left  dorsal Hand Crusted plaque        Assessment & Plan   SKIN CANCER SCREENING PERFORMED TODAY.  ACTINIC DAMAGE - Chronic condition, secondary to cumulative UV/sun exposure - diffuse scaly erythematous macules with underlying dyspigmentation - Recommend daily broad spectrum sunscreen SPF 30+ to sun-exposed areas, reapply every 2 hours as needed.  - Staying in the shade or wearing long sleeves, sun glasses (UVA+UVB protection) and wide brim hats (4-inch brim around the entire circumference of the hat) are also recommended for sun protection.  - Call for new or changing lesions.  MELANOCYTIC NEVI - Tan-brown  and/or pink-flesh-colored symmetric macules and papules - Benign appearing on exam today - Observation - Call clinic for new or changing moles - Recommend daily use of broad spectrum spf 30+ sunscreen to sun-exposed areas.   SEBORRHEIC KERATOSIS - Stuck-on, waxy, tan-brown papules and/or plaques  - Benign-appearing - Discussed benign etiology and prognosis. - Observe - Call for any changes  LENTIGINES Exam: scattered tan macules Due to sun exposure Treatment Plan: Benign-appearing, observe. Recommend daily broad spectrum sunscreen SPF 30+ to sun-exposed areas, reapply every 2 hours as needed.  Call for any changes    HEMANGIOMA Exam: red papule(s) Discussed benign nature. Recommend observation. Call for changes.   History of Kidney Transplant- High Risk of Skin Cancer - Discussed that risk of skin cancer, especially of SCC, due to anti rejection medications (tacrolimus, prednisone, mycophenolate) - Discussed that patient should be seen every 6-9 months  Neoplasm of uncertain behavior of skin (2) left temple  Skin / nail biopsy Type of biopsy: tangential   Instrument used: DermaBlade   Post-procedure details: wound care instructions given    Specimen 1 - Surgical pathology Differential Diagnosis: R/O NMSC  Check Margins: No  Left  dorsal Hand  Skin / nail biopsy Type of biopsy: tangential   Timeout: patient name, date of birth, surgical site, and procedure verified   Instrument used: DermaBlade   Post-procedure details: wound care instructions given    Specimen 2 - Surgical pathology Differential Diagnosis: R/O NMSC  Check Margins: No  Actinic keratoses - Start 5-fluorouracil cream twice a day for 14 days to affected areas including left ear, frontal scalp,  left preauricular area, nasal dorsum, and left dorsal hand.  Reviewed course of treatment and expected reaction.  Patient advised to expect inflammation and crusting and advised that erosions are possible.   Patient advised to be diligent with sun protection during and after treatment. Handout with details of how to apply medication and what to expect provided. Counseled to keep medication out of reach of children and pets.  AK (actinic keratosis) (5) bilateral hands  Destruction of lesion - bilateral hands (5)  Destruction method: cryotherapy   Lesion destroyed using liquid nitrogen: Yes   Region frozen until ice ball extended beyond lesion: Yes     Return in about 6 months (around 03/08/2024) for TBSE.  I, Tillie Fantasia, CMA, am acting as scribe for Gwenith Daily, MD.   Documentation: I have reviewed the above documentation for accuracy and completeness, and I agree with the above.  Gwenith Daily, MD

## 2023-09-09 DIAGNOSIS — D099 Carcinoma in situ, unspecified: Secondary | ICD-10-CM

## 2023-09-09 HISTORY — DX: Carcinoma in situ, unspecified: D09.9

## 2023-09-09 LAB — SURGICAL PATHOLOGY

## 2023-09-10 NOTE — Progress Notes (Signed)
Please call pt and notify their bx results were positive for a skin CA that will be treated by Dr. Onalee Hua (ED&C vs LN2)  Please schedule regular appointment and we can treat both  Diagnosis 1. 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 Skin , left dorsal hand SQUAMOUS CELL CARCINOMA IN SITU, HYPERTROPHIC Microscopic

## 2023-09-14 ENCOUNTER — Ambulatory Visit (HOSPITAL_COMMUNITY)
Admission: RE | Admit: 2023-09-14 | Discharge: 2023-09-14 | Disposition: A | Discharge status: Home / Self Care | Payer: PPO | Source: Ambulatory Visit | Attending: Family Medicine | Admitting: Family Medicine

## 2023-09-14 DIAGNOSIS — I4891 Unspecified atrial fibrillation: Secondary | ICD-10-CM | POA: Insufficient documentation

## 2023-09-14 DIAGNOSIS — Z940 Kidney transplant status: Secondary | ICD-10-CM | POA: Diagnosis not present

## 2023-09-14 DIAGNOSIS — I5083 High output heart failure: Secondary | ICD-10-CM | POA: Insufficient documentation

## 2023-09-14 DIAGNOSIS — I77810 Thoracic aortic ectasia: Secondary | ICD-10-CM | POA: Insufficient documentation

## 2023-09-14 DIAGNOSIS — E119 Type 2 diabetes mellitus without complications: Secondary | ICD-10-CM | POA: Diagnosis not present

## 2023-09-14 DIAGNOSIS — I340 Nonrheumatic mitral (valve) insufficiency: Secondary | ICD-10-CM | POA: Insufficient documentation

## 2023-09-14 LAB — ECHOCARDIOGRAM COMPLETE
Area-P 1/2: 2.79 cm2
S' Lateral: 3.9 cm

## 2023-09-21 ENCOUNTER — Ambulatory Visit: Payer: PPO | Admitting: Dermatology

## 2023-10-01 ENCOUNTER — Telehealth: Payer: Self-pay | Admitting: Family Medicine

## 2023-10-01 NOTE — Telephone Encounter (Signed)
Patient's wife called and said her husband is on blood thinners and he would like to start getting message therapy. They said he needs a letter stating its okay for him to get message therapy from his provider. She said she will come and pick up the letter when ready. His wife number is [phone].

## 2023-10-07 ENCOUNTER — Encounter: Payer: Self-pay | Admitting: Dermatology

## 2023-10-07 ENCOUNTER — Ambulatory Visit: Payer: PPO | Admitting: Dermatology

## 2023-10-07 VITALS — BP 134/87 | HR 51 | Temp 98.2°F

## 2023-10-07 DIAGNOSIS — D0439 Carcinoma in situ of skin of other parts of face: Secondary | ICD-10-CM | POA: Diagnosis not present

## 2023-10-07 DIAGNOSIS — L579 Skin changes due to chronic exposure to nonionizing radiation, unspecified: Secondary | ICD-10-CM

## 2023-10-07 DIAGNOSIS — L814 Other melanin hyperpigmentation: Secondary | ICD-10-CM | POA: Diagnosis not present

## 2023-10-07 DIAGNOSIS — C4492 Squamous cell carcinoma of skin, unspecified: Secondary | ICD-10-CM

## 2023-10-07 DIAGNOSIS — W908XXA Exposure to other nonionizing radiation, initial encounter: Secondary | ICD-10-CM | POA: Diagnosis not present

## 2023-10-07 MED ORDER — OXYCODONE HCL 5 MG PO CAPS
5.0000 mg | ORAL_CAPSULE | Freq: Four times a day (QID) | ORAL | 0 refills | Status: DC | PRN
Start: 2023-10-07 — End: 2024-01-25

## 2023-10-07 MED ORDER — MUPIROCIN 2 % EX OINT
1.0000 | TOPICAL_OINTMENT | Freq: Two times a day (BID) | CUTANEOUS | 0 refills | Status: DC
Start: 2023-10-07 — End: 2023-10-27

## 2023-10-07 NOTE — Patient Instructions (Signed)

## 2023-10-07 NOTE — Progress Notes (Signed)
Follow-Up Visit   Subjective  [name] [name] is a 62 y.o. male who presents for the following: Mohs of SCCIS of left temple.  Patient accompanied by his wife.   The following portions of the chart were reviewed this encounter and updated as appropriate: medications, allergies, medical history  Review of Systems:  No other skin or systemic complaints except as noted in HPI or Assessment and Plan.  Objective  Well appearing patient in no apparent distress; mood and affect are within normal limits.  A focused examination was performed of the following areas: Left temple Relevant physical exam findings are noted in the Assessment and Plan.   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 Yes   Was the anticoagulation regimen changed prior to Mohs? No    Procedure Details: Timeout: pre-procedure verification complete Pre-Op diagnosis: squamous cell carcinoma SCC subtype: in situ Surgical site (from skin exam): left temple Pre-operative length (cm): 1.8 Pre-operative width (cm): 1.4  Micrographic Surgery Details: Post-operative length (cm): 2.2 Post-operative width (cm): 1.8 Number of Mohs stages: 2  Patient tolerance of procedure: tolerated well, no immediate complications  Skin repair Complexity:  Complex Final length (cm):  5.8 Timeout: patient name, date of birth, surgical site, and procedure verified   Procedure prep:  Patient was prepped and draped in usual sterile fashion Prep type:  Chlorhexidine Anesthesia: the lesion was anesthetized in a standard fashion   Anesthetic:  1% lidocaine w/ epinephrine 1-100,000 local infiltration Reason for type of repair: reduce tension to allow closure, preserve normal anatomy and allow side-to-side closure  without requiring a flap or graft   Undermining: area extensively undermined   Subcutaneous layers (deep stitches):  Suture size:  4-0 and 5-0 Suture type: Vicryl (polyglactin 910) and Monocryl (poliglecaprone 25)   Stitches:  Buried vertical mattress Fine/surface layer approximation (top stitches):  Suture size:  5-0 Suture type: fast-absorbing plain gut   Stitches: simple running   Hemostasis achieved with: suture, pressure and electrodesiccation Outcome: patient tolerated procedure well with no complications   Post-procedure details: sterile dressing applied and wound care instructions given   Dressing type: bacitracin and pressure dressing    Related Medications oxycodone (OXY-IR) 5 MG capsule Take 1 capsule (5 mg total) by mouth every 6 (six) hours as needed for up to 8 doses.    Return in about 1 week (around 10/14/2023).  I, Tillie Fantasia, CMA, am acting as scribe for Gwenith Daily, MD.   Documentation: I have reviewed the above documentation for accuracy and completeness, and I agree with the above.   10/07/2023  HISTORY OF PRESENT ILLNESS  [name] [name] is seen in consultation at the request of Dr. 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 They note that the area has been present for about 1 year increasing in size with time.  There is no history of previous treatment.  Reports no other new or changing lesions and has no other complaints today.  Medications and allergies: see patient chart.  Review of systems: Reviewed 8 systems and notable for the above skin cancer.  All other systems reviewed are unremarkable/negative, unless noted in the HPI. Past medical history, surgical history, family history, social history were also reviewed and are noted in the chart/questionnaire.  PHYSICAL EXAMINATION  General: Well-appearing, in no acute distress, alert and oriented x 4. Vitals reviewed in chart (if available).   Skin: Exam reveals a 1.8 x 1.4 cm  erythematous papule and biopsy scar on the left temple. There are rhytids, telangiectasias, and lentigines, consistent with photodamage.  Biopsy report(s) reviewed, confirming the diagnosis.   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 Due to location, size, histology, or recurrence and the likelihood of subclinical extension as well as the need to conserve normal surrounding tissue, the patient was deemed acceptable for Mohs micrographic surgery (MMS).  The nature and purpose of the procedure, associated benefits and risks including recurrence and scarring, possible complications such as pain, infection, and bleeding, and alternative methods of treatment if appropriate were discussed with the patient during consent. The lesion location was verified by the patient, by reviewing previous notes, pathology reports, and by photographs as well as angulation measurements if available.  Informed consent was reviewed and signed by the patient, and timeout was performed at 10:15 AM. See op note below.  2. For the photodamage and solar lentigines, sun protection discussed/information given on OTC sunscreens, and we recommend continued regular follow-up with primary dermatologist every 6 months or sooner for any growing, bleeding, or changing lesions. 3. Prognosis and future surveillance discussed. 4. Letter with treatment outcome sent to referring provider. 5. Pain acetaminophen/oxycodone 5 mg   MOHS MICROGRAPHIC SURGERY AND RECONSTRUCTION  Initial size:   1.8 x 1.4 cm Surgical defect/wound size: 2.2 x 1.8 cm Anesthesia:    0.33% lidocaine with 1:200,000 epinephrine EBL:    <5 mL Complications:  None Repair type:   Complex SQ suture:   5-0 Monocryl and 4-0 Vicryl Cutaneous suture:  5-0 Fast Absorbing Gut Final size of the repair: 5 cm  Stages: 2  STAGE I: Anesthesia achieved with 0.5% lidocaine with 1:200,000 epinephrine.  ChloraPrep applied. 1 section(s) excised using Mohs technique (this includes total peripheral and deep tissue margin excision and evaluation with frozen sections, excised and interpreted by the same physician). The tumor was first debulked and then excised with an approx. 2 mm margin.  Hemostasis was achieved with electrocautery as needed.  The specimen was then oriented, subdivided/relaxed, inked, and processed using Mohs technique.    Frozen section analysis revealed a positive margin for full thickness epidermal architectural and cellular atypia, apoptotic cells, individual cell dyskeratosis with markedly altered maturation but usually still some surface keratinization and intercellular bridges present with marked nuclear atypia, including nuclear hyperchromasia and multinucleation in the peripheral margin.    STAGE II: An additional 2 mm margin was excised.  Hemostasis was achieved with electrocautery as needed.  The specimen was then oriented, subdivided/relaxed, inked, and processed using Mohs technique. Evaluation of slides by the Mohs surgeon revealed clear tumor margins.   Reconstruction  The surgical wound was then cleaned, prepped, and re-anesthetized as above. Wound edges were undermined extensively along at least one entire edge and at a distance equal to or greater than the width of the defect (see wound defect size above) in order to achieve closure and decrease wound tension and anatomic distortion. Redundant tissue repair including standing cone removal was performed. Hemostasis was achieved with electrocautery. Subcutaneous and epidermal tissues were approximated with the above sutures. The surgical site was then lightly scrubbed with sterile, saline-soaked gauze. Steri-strips were applied, and the area was then bandaged using Vaseline ointment, non-adherent  gauze, gauze pads, and tape to provide an adequate pressure dressing. The patient tolerated the procedure well, was given detailed  written and verbal wound care instructions, and was discharged in good condition.   The patient will follow-up: 7 days.  - mupirocin ointment (BACTROBAN) 2 %; Apply 1 Application topically 2 (two) times daily.  Dispense: 22 g; Refill: 0 - oxycodone (OXY-IR) 5 MG capsule; Take 1 capsule (5 mg total) by mouth every 6 (six) hours as needed for up to 8 doses.  Dispense: 8 capsule; Refill: 0  Gwenith Daily, MD

## 2023-10-12 ENCOUNTER — Telehealth: Payer: Self-pay | Admitting: Cardiovascular Disease

## 2023-10-12 ENCOUNTER — Encounter: Payer: Self-pay | Admitting: *Deleted

## 2023-10-12 NOTE — Telephone Encounter (Signed)
Patient's spouse called stating they need a letter stating that he can have massage therapy on his shoulder and back. The massage therapist wants the letter since he is on a blood thinner and the massage can cause bruising.

## 2023-10-12 NOTE — Telephone Encounter (Signed)
Patient's wife call office about letter. Front office read what Dr Clent Ridges wrote. Patient's wife will call kidney doctor.

## 2023-10-12 NOTE — Telephone Encounter (Signed)
Noted  

## 2023-10-12 NOTE — Telephone Encounter (Signed)
[name] Stade, MD     Ok to write letter for massage No cupping   Patient's wife notified.  She would like to pick letter up in office.  Will leave at front desk.

## 2023-10-14 ENCOUNTER — Encounter: Payer: Self-pay | Admitting: Dermatology

## 2023-10-14 ENCOUNTER — Ambulatory Visit: Payer: PPO | Admitting: Dermatology

## 2023-10-14 VITALS — BP 120/80 | Temp 98.1°F

## 2023-10-14 DIAGNOSIS — Z86007 Personal history of in-situ neoplasm of skin: Secondary | ICD-10-CM

## 2023-10-14 DIAGNOSIS — C4492 Squamous cell carcinoma of skin, unspecified: Secondary | ICD-10-CM

## 2023-10-14 DIAGNOSIS — D0462 Carcinoma in situ of skin of left upper limb, including shoulder: Secondary | ICD-10-CM

## 2023-10-14 NOTE — Progress Notes (Unsigned)
Follow-Up Visit   Subjective  [name] [name] is a 62 y.o. male presents for the following: Mohs of left dorsal hand and follow up from Mohs on his left temple.  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  The following portions of the chart were reviewed this encounter and updated as appropriate: medications, allergies, medical history  Review of Systems:  No other skin or systemic complaints except as noted in HPI or Assessment and Plan.  Objective  Well appearing patient in no apparent distress; mood and affect are within normal limits.  A focused examination was performed of the following areas:  Left hand and left forehead.  Relevant physical exam findings are noted in the Assessment and Plan.   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 Yes   Was the anticoagulation regimen changed prior to Mohs? No    Procedure Details: Timeout: pre-procedure verification complete Procedure Prep: patient was prepped and draped in usual sterile fashion Prep type: chlorhexidine Frozen section biopsy performed: No   Specimen debulked: No   Pre-Op diagnosis: squamous cell carcinoma SCC subtype: in situ MohsAIQ Surgical site (if tumor spans multiple areas, please select predominant area): hand Surgery side: left Surgical site (from skin exam): Left dorsal hand Pre-operative length (cm): 1.5 Pre-operative width (cm): 2.1 Indications for Mohs surgery: anatomic location where tissue conservation is critical Previously treated? No    Micrographic Surgery Details: Post-operative length (cm): 2.6 Post-operative width (cm): 2.4 Number of Mohs  stages: 1  Stage 1    Tumor features identified on Mohs section: no tumor identified    Depth of defect after stage: dermis    Perineural invasion: no perineural invasion  Patient tolerance of procedure: tolerated well, no immediate complications  Reconstruction: Was the defect reconstructed? Yes   Was reconstruction performed by the same Mohs surgeon? Yes   Setting of reconstruction: outpatient office When was reconstruction performed? same day Type of reconstruction: graft Graft type: full thickness  Skin repair Complexity:  Simple Informed consent: discussed and consent obtained   Timeout: patient name, date of birth, surgical site, and procedure verified   Procedure prep:  Patient was prepped and draped in usual sterile fashion Prep type:  Chlorhexidine Anesthesia: the lesion was anesthetized in a standard fashion   Undermining: edges could be approximated without difficulty   Fine/surface layer approximation (top stitches):  Hemostasis achieved with: suture and electrodesiccation Outcome: patient tolerated procedure well with no complications   Post-procedure details: sterile dressing applied and wound care instructions given   Dressing type: pressure dressing and petrolatum    Related Medications mupirocin ointment (BACTROBAN) 2 % Apply 1 Application topically 2 (two) times daily.  oxycodone (OXY-IR) 5 MG capsule Take 1 capsule (5 mg total) by mouth every 6 (six) hours as needed for up to 8 doses.  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  I, Tillie Fantasia, CMA, am acting as scribe for Gwenith Daily, MD.   Documentation: I have reviewed the above documentation for accuracy and completeness, and I agree  with the above.  Gwenith Daily, MD

## 2023-10-14 NOTE — Patient Instructions (Signed)

## 2023-10-21 ENCOUNTER — Ambulatory Visit: Payer: PPO | Admitting: Dermatology

## 2023-10-21 ENCOUNTER — Encounter: Payer: Self-pay | Admitting: Dermatology

## 2023-10-21 VITALS — BP 120/80

## 2023-10-21 DIAGNOSIS — D0462 Carcinoma in situ of skin of left upper limb, including shoulder: Secondary | ICD-10-CM

## 2023-10-21 DIAGNOSIS — C4492 Squamous cell carcinoma of skin, unspecified: Secondary | ICD-10-CM

## 2023-10-21 DIAGNOSIS — Z86007 Personal history of in-situ neoplasm of skin: Secondary | ICD-10-CM

## 2023-10-21 NOTE — Patient Instructions (Signed)

## 2023-10-21 NOTE — Progress Notes (Signed)
   Follow-Up Visit   Subjective  [name] [name] is a 62 y.o. male who presents for the following: Mohs follow up of left hand and recheck graft site of left upper arm   The following portions of the chart were reviewed this encounter and updated as appropriate: medications, allergies, medical history  Review of Systems:  No other skin or systemic complaints except as noted in HPI or Assessment and Plan.  Objective  Well appearing patient in no apparent distress; mood and affect are within normal limits.   A focused examination was performed of the following areas: Left hand, left arm  Relevant exam findings are noted in the Assessment and Plan.   Assessment & Plan   HISTORY OF SQUAMOUS CELL CARCINOMA OF THE SKIN OF LEFT DORSAL HAND AND LEFT TEMPLE - No evidence of recurrence today - Recommend regular full body skin exams - Recommend daily broad spectrum sunscreen SPF 30+ to sun-exposed areas, reapply every 2 hours as needed.  - Call if any new or changing lesions are noted between office visits - Recommend continue mupirocin and a bandage daily to the graft area on the left dorsal hand          Return in about 3 weeks (around 11/11/2023) for Wound check and 3 months for TBSE.  I, Joanie Coddington, CMA, am acting as scribe for Gwenith Daily, MD .   Documentation: I have reviewed the above documentation for accuracy and completeness, and I agree with the above.  Gwenith Daily, MD

## 2023-10-26 ENCOUNTER — Other Ambulatory Visit: Payer: Self-pay | Admitting: Cardiovascular Disease

## 2023-10-27 ENCOUNTER — Other Ambulatory Visit: Payer: Self-pay | Admitting: Dermatology

## 2023-10-27 DIAGNOSIS — C4492 Squamous cell carcinoma of skin, unspecified: Secondary | ICD-10-CM

## 2023-11-11 ENCOUNTER — Ambulatory Visit: Payer: PPO | Admitting: Dermatology

## 2023-11-11 ENCOUNTER — Encounter: Payer: Self-pay | Admitting: Dermatology

## 2023-11-11 VITALS — BP 132/92

## 2023-11-11 DIAGNOSIS — L570 Actinic keratosis: Secondary | ICD-10-CM

## 2023-11-11 DIAGNOSIS — W908XXA Exposure to other nonionizing radiation, initial encounter: Secondary | ICD-10-CM | POA: Diagnosis not present

## 2023-11-11 DIAGNOSIS — L905 Scar conditions and fibrosis of skin: Secondary | ICD-10-CM

## 2023-11-11 NOTE — Patient Instructions (Signed)
 Important Information  Due to recent changes in healthcare laws, you may see results of your pathology and/or laboratory studies on MyChart before the doctors have had a chance to review them. We understand that in some cases there may be results that are confusing or concerning to you. Please understand that not all results are received at the same time and often the doctors may need to interpret multiple results in order to provide you with the best plan of care or course of treatment. Therefore, we ask that you please give [place] 2 business days to thoroughly review all your results before contacting the office for clarification. Should we see a critical lab result, you will be contacted sooner.   If You Need Anything After Your Visit  If you have any questions or concerns for your doctor, please call our main line at [phone] If no one answers, please leave a voicemail as directed and we will return your call as soon as possible. Messages left after 4 pm will be answered the following business day.   You may also send [place] a message via MyChart. We typically respond to MyChart messages within 1-2 business days.  For prescription refills, please ask your pharmacy to contact our office. Our fax number is [phone].  If you have an urgent issue when the clinic is closed that cannot wait until the next business day, you can page your doctor at the number below.    Please note that while we do our best to be available for urgent issues outside of office hours, we are not available 24/7.   If you have an urgent issue and are unable to reach [place], you may choose to seek medical care at your doctor's office, retail clinic, urgent care center, or emergency room.  If you have a medical emergency, please immediately call 911 or go to the emergency department. In the event of inclement weather, please call our main line at [phone] for an update on the status of any delays or  closures.  Dermatology Medication Tips: Please keep the boxes that topical medications come in in order to help keep track of the instructions about where and how to use these. Pharmacies typically print the medication instructions only on the boxes and not directly on the medication tubes.   If your medication is too expensive, please contact our office at [phone] or send [place] a message through MyChart.   We are unable to tell what your co-pay for medications will be in advance as this is different depending on your insurance coverage. However, we may be able to find a substitute medication at lower cost or fill out paperwork to get insurance to cover a needed medication.   If a prior authorization is required to get your medication covered by your insurance company, please allow [place] 1-2 business days to complete this process.  Drug prices often vary depending on where the prescription is filled and some pharmacies may offer cheaper prices.  The website www.goodrx.com contains coupons for medications through different pharmacies. The prices here do not account for what the cost may be with help from insurance (it may be cheaper with your insurance), but the website can give you the price if you did not use any insurance.  - You can print the associated coupon and take it with your prescription to the pharmacy.  - You may also stop by our office during regular business hours and pick up a GoodRx coupon card.  - If  you need your prescription sent electronically to a different pharmacy, notify our office through [facility] or by phone at [phone]    Skin Education :   I counseled the patient regarding the following: Sun screen (SPF 30 or greater) should be applied during peak UV exposure (between 10am and 2pm) and reapplied after exercise or swimming.  The ABCDEs of melanoma were reviewed with the patient, and the importance of monthly self-examination of moles was emphasized.  Should any moles change in shape or color, or itch, bleed or burn, pt will contact our office for evaluation sooner then their interval appointment.  Plan: Sunscreen Recommendations I recommended a broad spectrum sunscreen with a SPF of 30 or higher. I explained that SPF 30 sunscreens block approximately 97 percent of the sun's harmful rays. Sunscreens should be applied at least 15 minutes prior to expected sun exposure and then every 2 hours after that as long as sun exposure continues. If swimming or exercising sunscreen should be reapplied every 45 minutes to an hour after getting wet or sweating. One ounce, or the equivalent of a shot glass full of sunscreen, is adequate to protect the skin not covered by a bathing suit. I also recommended a lip balm with a sunscreen as well. Sun protective clothing can be used in lieu of sunscreen but must be worn the entire time you are exposed to the sun's rays.

## 2023-11-11 NOTE — Progress Notes (Signed)
New Patient Visit   Subjective  [name] [name] is a 62 y.o. male who presents for the following: follow up from Mohs surgery   The patient presents for follow up from Mohs surgery for a SCC on the left dorsal hand, treated on 10/14/23, repaired with FTSG. The patient has been bandaging the wound as directed. The endorse the following concerns: no questions or concerns at this time.  He is also s/p Mohs for a SCCIS on his left temple treated on 10/07/2023  The following portions of the chart were reviewed this encounter and updated as appropriate: medications, allergies, medical history  Review of Systems:  No other skin or systemic complaints except as noted in HPI or Assessment and Plan.  Objective  Well appearing patient in no apparent distress; mood and affect are within normal limits.  A full examination was performed including scalp, head, face and left hand. All findings within normal limits unless otherwise noted below.  Healing wounds with mild erythema  Relevant physical exam findings are noted in the Assessment and Plan.   Assessment & Plan    Healing s/p Mohs for [facility], treated on 10/14/23, repaired with FTSG - Reassured that wound is healing well - No evidence of infection - No swelling, induration, purulence, dehiscence, or tenderness out of proportion to the clinical exam, see photo above - Discussed that scars take up to 12 months to mature from the date of surgery - Recommend SPF 30+ to scar daily to prevent purple color from UV exposure during scar maturation process - Discussed that erythema and raised appearance of scar will fade over the next 4-6 months - OK to start scar massage at 4-6 weeks post-op - Can consider silicone based products for scar healing starting at 6 weeks post-op - Ok to continue ointment daily to wound under a bandage for another few days.    Healing s/p Mohs for SCC on left temple, treated on 10/07/23, repaired with linear repair -  Reassured that wound is healing well - No evidence of infection - No swelling, induration, purulence, dehiscence, or tenderness out of proportion to the clinical exam, see photo above - Discussed that scars take up to 12 months to mature from the date of surgery - Recommend SPF 30+ to scar daily to prevent purple color from UV exposure during scar maturation process - Discussed that erythema and raised appearance of scar will fade over the next 4-6 months - OK to start scar massage at 4-6 weeks post-op - Can consider silicone based products for scar healing starting at 6 weeks post-op - Ok to continue ointment daily to wound under a bandage for another few days.   ACTINIC KERATOSIS- nose, scalp s/p topical 5FU, resolved  Actinic keratoses are precancerous spots that appear secondary to cumulative UV radiation exposure/sun exposure over time. They are chronic with expected duration over 1 year. A portion of actinic keratoses will progress to squamous cell carcinoma of the skin. It is not possible to reliably predict which spots will progress to skin cancer and so treatment is recommended to prevent development of skin cancer.  Recommend daily broad spectrum sunscreen SPF 30+ to sun-exposed areas, reapply every 2 hours as needed.  Recommend staying in the shade or wearing long sleeves, sun glasses (UVA+UVB protection) and wide brim hats (4-inch brim around the entire circumference of the hat). Call for new or changing lesions.  Return in about 3 months (around 02/09/2024) for tbsc.  Dominga Ferry, Surg  Tech III, am acting as scribe for Gwenith Daily, MD.   Documentation: I have reviewed the above documentation for accuracy and completeness, and I agree with the above.  Gwenith Daily, MD

## 2023-11-16 ENCOUNTER — Ambulatory Visit (INDEPENDENT_AMBULATORY_CARE_PROVIDER_SITE_OTHER): Payer: PPO | Admitting: Family Medicine

## 2023-11-16 ENCOUNTER — Encounter: Payer: Self-pay | Admitting: Family Medicine

## 2023-11-16 VITALS — BP 122/80 | HR 63 | Temp 98.2°F | Resp 18 | Ht 70.0 in | Wt 260.2 lb

## 2023-11-16 DIAGNOSIS — C44309 Unspecified malignant neoplasm of skin of other parts of face: Secondary | ICD-10-CM

## 2023-11-16 DIAGNOSIS — I4891 Unspecified atrial fibrillation: Secondary | ICD-10-CM

## 2023-11-16 DIAGNOSIS — M79602 Pain in left arm: Secondary | ICD-10-CM | POA: Diagnosis not present

## 2023-11-16 NOTE — Patient Instructions (Signed)
It was a pleasure meeting you today. Thank you for allowing me to take part in your health care.  Our goals for today as we discussed include:  Continue Biofreeze as needed  Will touch base in a couple of days.    This is a list of the screening recommended for you and due dates:  Health Maintenance  Topic Date Due   Complete foot exam   Never done   Eye exam for diabetics  Never done   Zoster (Shingles) Vaccine (1 of 2) Never done   Colon Cancer Screening  11/29/2017   COVID-19 Vaccine (2 - Pfizer risk series) 12/28/2020   Medicare Annual Wellness Visit  06/10/2022   Flu Shot  07/02/2023   Hemoglobin A1C  01/24/2024   DTaP/Tdap/Td vaccine (6 - Td or Tdap) 07/04/2027   Hepatitis C Screening  Completed   HIV Screening  Completed   HPV Vaccine  Aged Out    Follow up as needed  If you have any questions or concerns, please do not hesitate to call the office at [phone].  I look forward to our next visit and until then take care and stay safe.  Regards,   Dana Allan, MD   [facility]

## 2023-11-16 NOTE — Progress Notes (Signed)
SUBJECTIVE:   Chief Complaint  Patient presents with   Shoulder Pain    Left shoulder hurts worse at night    HPI Presents to clinic for acute visit  Discussed the use of AI scribe software for clinical note transcription with the patient, who gave verbal consent to proceed.  History of Present Illness The patient, with a history of organ transplantation and recent skin cancer diagnosis, presents with chronic shoulder pain. The patient expresses gratitude for a recent dermatology referral, which led to the diagnosis and successful Mohs surgery for two skin cancer lesions. The patient is scheduled for follow-up every three months due to high-risk status.  The primary concern is a persistent shoulder pain, which has been ongoing for several years and has recently worsened. The pain is described as intermittent, increasing with physical activity and subsiding with rest. The patient reports relief with Biofreeze application. Previous attempts at physical therapy were unsuccessful, and the patient discontinued sessions due to lack of improvement and subsequent COVID-19 infection. The patient has a history of knee osteoarthritis, managed with gel injections, and inquires about similar treatment options for the shoulder.  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 Despite this, the patient's breathing is reported to be improving. The patient is on multiple medications related to the organ transplant and is cautious about adding new medications due to potential kidney impact. The patient expresses a desire to discontinue blood thinners due to excessive bleeding from minor injuries and is considering a Watchman procedure.  The patient is proactive about his health, researching his conditions and treatments, and values thorough and honest communication from healthcare providers. The patient's spouse is actively involved in the patient's care and  accompanies the patient to appointments.    PERTINENT PMH / PSH: As above  OBJECTIVE:  BP 122/80   Pulse 63   Temp 98.2 F (36.8 C)   Resp 18   Ht 5\' 10"  (1.778 m)   Wt 260 lb 4 oz (118 kg)   SpO2 97%   BMI 37.34 kg/m    Physical Exam Vitals reviewed.  Constitutional:      General: He is not in acute distress.    Appearance: Normal appearance. He is normal weight. He is not ill-appearing, toxic-appearing or diaphoretic.  Eyes:     General:        Right eye: No discharge.        Left eye: No discharge.  Cardiovascular:     Rate and Rhythm: Normal rate.  Pulmonary:     Effort: Pulmonary effort is normal.  Musculoskeletal:        General: Normal range of motion.     Right shoulder: Normal. No tenderness, bony tenderness or crepitus. Normal range of motion. Normal strength. Normal pulse.     Left shoulder: No tenderness, bony tenderness or crepitus. Normal range of motion. Normal strength. Normal pulse.     Right upper arm: Normal.     Left upper arm: Tenderness present. No bony tenderness.       Arms:     Cervical back: Normal range of motion.  Skin:    General: Skin is warm and dry.  Neurological:     Mental Status: He is alert and oriented to person, place, and time. Mental status is at baseline.  Psychiatric:        Mood and Affect: Mood normal.        Behavior: Behavior normal.  Thought Content: Thought content normal.        Judgment: Judgment normal.        11/16/2023    1:55 PM 03/30/2023    2:21 PM  Depression screen PHQ 2/9  Decreased Interest 0 0  Down, Depressed, Hopeless 0 0  PHQ - 2 Score 0 0  Altered sleeping 0   Tired, decreased energy 0   Change in appetite 0   Feeling bad or failure about yourself  0   Trouble concentrating 0   Moving slowly or fidgety/restless 0   Suicidal thoughts 0   PHQ-9 Score 0   Difficult doing work/chores Not difficult at all       11/16/2023    1:55 PM  GAD 7 : Generalized Anxiety Score  Nervous,  Anxious, on Edge 0  Control/stop worrying 0  Worry too much - different things 0  Trouble relaxing 0  Restless 0  Easily annoyed or irritable 0  Afraid - awful might happen 0  Total GAD 7 Score 0  Anxiety Difficulty Not difficult at all    ASSESSMENT/PLAN:  Arm pain, anterior, left Assessment & Plan: Likely tendonitis or bursitis based on physical exam and patient's description of pain. Pain increases with repetitive work and is relieved with Biofreeze. -Continue Biofreeze as needed for pain. -Consider use of Voltaren gel twice daily, with caution due to kidney issues. -Consider use of Tylenol for arthritic pain. -Consider use of Tumeric for anti-inflammatory properties. -Refer to sports medicine physician for further evaluation and possible ultrasound. -May be candidate for shock therapy -Spoke with Dr Edgardo Roys at [facility] and agreed to see patient in consultation.  Orders: -     Ambulatory referral to Sports Medicine  Skin cancer of forehead Assessment & Plan: Recent diagnosis and treatment with Mohs surgery by dermatologist. Patient is considered high risk and will be seen every three months by dermatologist. -Continue follow-up with dermatologist as recommended.   Atrial fibrillation with rapid ventricular response (HCC) Assessment & Plan: Patient on blood thinners and considering Watchman procedure to come off blood thinners due to bleeding issues. -Coordinate with cardiologist regarding timing and management of blood thinners around Watchman procedure.      General Health Maintenance -Obtain shingles vaccine at pharmacy. -Continue regular follow-up with nephrologist and cardiologist. -Annual visit to be scheduled.    PDMP reviewed  Return if symptoms worsen or fail to improve, for PCP.  Dana Allan, MD

## 2023-11-19 DIAGNOSIS — M79602 Pain in left arm: Secondary | ICD-10-CM | POA: Insufficient documentation

## 2023-11-19 NOTE — Assessment & Plan Note (Addendum)
Likely tendonitis or bursitis based on physical exam and patient's description of pain. Pain increases with repetitive work and is relieved with Biofreeze. -Continue Biofreeze as needed for pain. -Consider use of Voltaren gel twice daily, with caution due to kidney issues. -Consider use of Tylenol for arthritic pain. -Consider use of Tumeric for anti-inflammatory properties. -Refer to sports medicine physician for further evaluation and possible ultrasound. -May be candidate for shock therapy -Spoke with Dr Edgardo Roys at [facility] and agreed to see patient in consultation.

## 2023-11-20 ENCOUNTER — Encounter: Payer: Self-pay | Admitting: Family Medicine

## 2023-11-20 DIAGNOSIS — C44309 Unspecified malignant neoplasm of skin of other parts of face: Secondary | ICD-10-CM | POA: Insufficient documentation

## 2023-11-20 NOTE — Assessment & Plan Note (Signed)
Patient on blood thinners and considering Watchman procedure to come off blood thinners due to bleeding issues. -Coordinate with cardiologist regarding timing and management of blood thinners around Watchman procedure.

## 2023-11-20 NOTE — Assessment & Plan Note (Signed)
Recent diagnosis and treatment with Mohs surgery by dermatologist. Patient is considered high risk and will be seen every three months by dermatologist. -Continue follow-up with dermatologist as recommended.

## 2023-12-08 ENCOUNTER — Encounter: Payer: Self-pay | Admitting: Family Medicine

## 2023-12-08 ENCOUNTER — Other Ambulatory Visit: Payer: Self-pay | Admitting: Family Medicine

## 2023-12-08 DIAGNOSIS — M75102 Unspecified rotator cuff tear or rupture of left shoulder, not specified as traumatic: Secondary | ICD-10-CM

## 2023-12-10 LAB — HEMOGLOBIN A1C: Hemoglobin A1C: 6.4

## 2023-12-15 ENCOUNTER — Ambulatory Visit
Admission: RE | Admit: 2023-12-15 | Discharge: 2023-12-15 | Disposition: A | Discharge status: Home / Self Care | Payer: PPO | Source: Ambulatory Visit | Attending: Family Medicine

## 2023-12-15 DIAGNOSIS — M75102 Unspecified rotator cuff tear or rupture of left shoulder, not specified as traumatic: Secondary | ICD-10-CM

## 2024-01-07 ENCOUNTER — Telehealth (HOSPITAL_COMMUNITY): Payer: Self-pay | Admitting: Cardiology

## 2024-01-11 ENCOUNTER — Telehealth: Payer: Self-pay | Admitting: Cardiovascular Disease

## 2024-01-11 ENCOUNTER — Other Ambulatory Visit: Payer: Self-pay | Admitting: Orthopaedic Surgery

## 2024-01-11 DIAGNOSIS — G8929 Other chronic pain: Secondary | ICD-10-CM

## 2024-01-11 NOTE — Telephone Encounter (Signed)
   Pre-operative Risk Assessment    Patient Name: [name] [name]  DOB: Feb 04, 1961 MRN: [id number]   Date of last office visit: 07-01-2023 Date of next office visit: 02-18-2024   Request for Surgical Clearance    Procedure:   left referse total shoulder arthroplasty  Date of Surgery:  Clearance TBD                                Surgeon:  Dr. Grafton Lawrence Surgeon's Group or Practice Name:  [facility]  Phone number:  [phone] Mark Sil Fax number:  [phone]   Type of Clearance Requested:   - Medical  - Pharmacy:  Hold Rivaroxaban  (Xarelto ) No mention of Xarelto  on clearance request   Type of Anesthesia:  General  with interscalene block   Additional requests/questions:    [name]   01/11/2024, 2:29 PM

## 2024-01-12 ENCOUNTER — Encounter: Payer: Self-pay | Admitting: Dermatology

## 2024-01-12 ENCOUNTER — Ambulatory Visit: Payer: PPO | Admitting: Dermatology

## 2024-01-12 ENCOUNTER — Telehealth: Payer: Self-pay

## 2024-01-12 VITALS — BP 132/94 | HR 97

## 2024-01-12 DIAGNOSIS — Z940 Kidney transplant status: Secondary | ICD-10-CM

## 2024-01-12 DIAGNOSIS — L814 Other melanin hyperpigmentation: Secondary | ICD-10-CM | POA: Diagnosis not present

## 2024-01-12 DIAGNOSIS — L905 Scar conditions and fibrosis of skin: Secondary | ICD-10-CM

## 2024-01-12 DIAGNOSIS — W908XXA Exposure to other nonionizing radiation, initial encounter: Secondary | ICD-10-CM | POA: Diagnosis not present

## 2024-01-12 DIAGNOSIS — L578 Other skin changes due to chronic exposure to nonionizing radiation: Secondary | ICD-10-CM | POA: Diagnosis not present

## 2024-01-12 DIAGNOSIS — Z808 Family history of malignant neoplasm of other organs or systems: Secondary | ICD-10-CM

## 2024-01-12 DIAGNOSIS — D849 Immunodeficiency, unspecified: Secondary | ICD-10-CM

## 2024-01-12 DIAGNOSIS — Z1283 Encounter for screening for malignant neoplasm of skin: Secondary | ICD-10-CM

## 2024-01-12 DIAGNOSIS — D1801 Hemangioma of skin and subcutaneous tissue: Secondary | ICD-10-CM

## 2024-01-12 DIAGNOSIS — L570 Actinic keratosis: Secondary | ICD-10-CM

## 2024-01-12 DIAGNOSIS — L821 Other seborrheic keratosis: Secondary | ICD-10-CM

## 2024-01-12 DIAGNOSIS — D229 Melanocytic nevi, unspecified: Secondary | ICD-10-CM

## 2024-01-12 DIAGNOSIS — C4492 Squamous cell carcinoma of skin, unspecified: Secondary | ICD-10-CM

## 2024-01-12 DIAGNOSIS — D8489 Other immunodeficiencies: Secondary | ICD-10-CM

## 2024-01-12 DIAGNOSIS — Z86007 Personal history of in-situ neoplasm of skin: Secondary | ICD-10-CM

## 2024-01-12 DIAGNOSIS — Z85828 Personal history of other malignant neoplasm of skin: Secondary | ICD-10-CM

## 2024-01-12 NOTE — Patient Instructions (Signed)
Important Information  Due to recent changes in healthcare laws, you may see results of your pathology and/or laboratory studies on MyChart before the doctors have had a chance to review them. We understand that in some cases there may be results that are confusing or concerning to you. Please understand that not all results are received at the same time and often the doctors may need to interpret multiple results in order to provide you with the best plan of care or course of treatment. Therefore, we ask that you please give [place] 2 business days to thoroughly review all your results before contacting the office for clarification. Should we see a critical lab result, you will be contacted sooner.   If You Need Anything After Your Visit  If you have any questions or concerns for your doctor, please call our main line at [phone] If no one answers, please leave a voicemail as directed and we will return your call as soon as possible. Messages left after 4 pm will be answered the following business day.   You may also send [place] a message via MyChart. We typically respond to MyChart messages within 1-2 business days.  For prescription refills, please ask your pharmacy to contact our office. Our fax number is [phone].  If you have an urgent issue when the clinic is closed that cannot wait until the next business day, you can page your doctor at the number below.    Please note that while we do our best to be available for urgent issues outside of office hours, we are not available 24/7.   If you have an urgent issue and are unable to reach [place], you may choose to seek medical care at your doctor's office, retail clinic, urgent care center, or emergency room.  If you have a medical emergency, please immediately call 911 or go to the emergency department. In the event of inclement weather, please call our main line at [phone] for an update on the status of any delays or  closures.  Dermatology Medication Tips: Please keep the boxes that topical medications come in in order to help keep track of the instructions about where and how to use these. Pharmacies typically print the medication instructions only on the boxes and not directly on the medication tubes.   If your medication is too expensive, please contact our office at [phone] or send [place] a message through MyChart.   We are unable to tell what your co-pay for medications will be in advance as this is different depending on your insurance coverage. However, we may be able to find a substitute medication at lower cost or fill out paperwork to get insurance to cover a needed medication.   If a prior authorization is required to get your medication covered by your insurance company, please allow [place] 1-2 business days to complete this process.  Drug prices often vary depending on where the prescription is filled and some pharmacies may offer cheaper prices.  The website www.goodrx.com contains coupons for medications through different pharmacies. The prices here do not account for what the cost may be with help from insurance (it may be cheaper with your insurance), but the website can give you the price if you did not use any insurance.  - You can print the associated coupon and take it with your prescription to the pharmacy.  - You may also stop by our office during regular business hours and pick up a GoodRx coupon card.  - If  you need your prescription sent electronically to a different pharmacy, notify our office through [facility] or by phone at [phone]    Skin Education :   I counseled the patient regarding the following: Sun screen (SPF 30 or greater) should be applied during peak UV exposure (between 10am and 2pm) and reapplied after exercise or swimming.  The ABCDEs of melanoma were reviewed with the patient, and the importance of monthly self-examination of moles was emphasized.  Should any moles change in shape or color, or itch, bleed or burn, pt will contact our office for evaluation sooner then their interval appointment.  Plan: Sunscreen Recommendations I recommended a broad spectrum sunscreen with a SPF of 30 or higher. I explained that SPF 30 sunscreens block approximately 97 percent of the sun's harmful rays. Sunscreens should be applied at least 15 minutes prior to expected sun exposure and then every 2 hours after that as long as sun exposure continues. If swimming or exercising sunscreen should be reapplied every 45 minutes to an hour after getting wet or sweating. One ounce, or the equivalent of a shot glass full of sunscreen, is adequate to protect the skin not covered by a bathing suit. I also recommended a lip balm with a sunscreen as well. Sun protective clothing can be used in lieu of sunscreen but must be worn the entire time you are exposed to the sun's rays. For areas treated with Liquid Nitrogen:  Keep clean with soap and water.  Apply Vaseline or Aquaphor twice daily.

## 2024-01-12 NOTE — Telephone Encounter (Signed)
Called and spoke to pt and told him we received a clearance form, and we need to make him an appointment to be seen. He has an appointment with cardiology on March 20,2025, so I  scheduled pt with Dr. Clent Ridges on March 21,2025.

## 2024-01-12 NOTE — Telephone Encounter (Signed)
Good morning,   Patient's chart was reviewed for preoperative cardiac evaluation. Since he is a patient followed by [facility], we request that you comment on cardiac risk for upcoming procedure. If you would like an APP to perform a telephone assessment for clearance, we can do that as well.    Please route your response to p cv div preop.  Thank you, Levi Aland, NP-C 01/12/2024, 8:17 AM

## 2024-01-12 NOTE — Progress Notes (Signed)
Follow-Up Visit   Subjective  [name] [name] is a 63 y.o. male who presents for the following: Skin Cancer Screening and Full Body Skin Exam. Hx of scc. Family hx MM. He has a history of a liver transplant in   The patient presents for Total-Body Skin Exam (TBSE) for skin cancer screening and mole check. The patient has spots, moles and lesions to be evaluated, some may be new or changing.   The following portions of the chart were reviewed this encounter and updated as appropriate: medications, allergies, medical history  Review of Systems:  No other skin or systemic complaints except as noted in HPI or Assessment and Plan.  Objective  Well appearing patient in no apparent distress; mood and affect are within normal limits.  A full examination was performed including scalp, head, eyes, ears, nose, lips, neck, chest, axillae, abdomen, back, buttocks, bilateral upper extremities, bilateral lower extremities, hands, feet, fingers, toes, fingernails, and toenails. All findings within normal limits unless otherwise noted below.   Relevant physical exam findings are noted in the Assessment and Plan.     Assessment & Plan   SKIN CANCER SCREENING PERFORMED TODAY.  ACTINIC DAMAGE - Chronic condition, secondary to cumulative UV/sun exposure - diffuse scaly erythematous macules with underlying dyspigmentation - Recommend daily broad spectrum sunscreen SPF 30+ to sun-exposed areas, reapply every 2 hours as needed.  - Staying in the shade or wearing long sleeves, sun glasses (UVA+UVB protection) and wide brim hats (4-inch brim around the entire circumference of the hat) are also recommended for sun protection.  - Call for new or changing lesions.  LENTIGINES, SEBORRHEIC KERATOSES, HEMANGIOMAS - Benign normal skin lesions - Benign-appearing - Call for any changes  MELANOCYTIC NEVI - Tan-brown and/or pink-flesh-colored symmetric macules and papules - Benign appearing on exam today -  Observation - Call clinic for new or changing moles - Recommend daily use of broad spectrum spf 30+ sunscreen to sun-exposed areas.   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, treated on 10/07/23, repaired with linear repair - Reassured that wound is healing well - No evidence of infection - No swelling, induration, purulence, dehiscence, or tenderness out of proportion to the clinical exam, see photo above - Discussed that scars take up to 12 months to mature from the date of surgery  History of Kidney Transplant- High Risk of Skin Cancer- 01/13/2019- Immunosuppressed 2/2 to Anti Rejection Meds - Discussed that risk of skin cancer, especially of SCC, due to anti rejection medications (tacrolimus, prednisone, mycophenolate) - Discussed that patient should be seen every 6-9 months  ACTINIC KERATOSIS Exam: Erythematous thin papules/macules with gritty scale at the bilateral forearms and dorsal hands  Actinic keratoses are precancerous spots that appear secondary to cumulative UV radiation exposure/sun exposure over time. They are chronic with expected duration over 1 year. A portion of actinic keratoses will progress to squamous cell carcinoma of the skin. It is not possible to reliably predict which spots will progress to skin cancer and so treatment is recommended to prevent development of skin cancer.  Recommend daily broad spectrum sunscreen SPF 30+ to sun-exposed areas, reapply  every 2 hours as needed.  Recommend staying in the shade or wearing long sleeves, sun glasses (UVA+UVB protection) and wide brim hats (4-inch brim around the entire  circumference of the hat). Call for new or changing lesions.  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 Related Medications oxycodone (OXY-IR) 5 MG capsule Take 1 capsule (5 mg total) by mouth every 6 (six) hours as needed for up to 8 doses. mupirocin ointment (BACTROBAN) 2 % APPLY TOPICALLY TWICE DAILY AK (ACTINIC KERATOSIS) (3) Left Elbow - Posterior, Left Hand - Posterior, Right Hand - Posterior Destruction of lesion - Left Elbow - Posterior, Left Hand - Posterior, Right Hand - Posterior Complexity: simple   Destruction method: cryotherapy   Informed consent: discussed and consent obtained   Timeout:  patient name, date of birth, surgical site, and procedure verified Lesion destroyed using liquid nitrogen: Yes   Region frozen until ice ball extended beyond lesion: Yes   Cryotherapy cycles:  2 Outcome: patient tolerated procedure well with no complications   Post-procedure details: wound care instructions given   Return in about 3 months (around 04/10/2024) for TBSC.  IBernette Redbird, Surg Tech III, am acting as scribe for Gwenith Daily, MD.   We spent 45 min reviewing records, taking the patient history, providing face to face care with the patient, sending prescriptions.   Documentation: I have reviewed the above documentation for accuracy and completeness, and I agree with the above.  Gwenith Daily, MD

## 2024-01-14 NOTE — Telephone Encounter (Signed)
Patient with diagnosis of PAF on Xarelto for anticoagulation.    Procedure:  left referse total shoulder arthroplasty  Date of procedure: TBD   CHA2DS2-VASc Score = 6   This indicates a 9.7% annual risk of stroke. The patient's score is based upon: CHF History: 1 HTN History: 1 Diabetes History: 1 Stroke History: 2 Vascular Disease History: 0 Age Score: 1 Gender Score: 0     CrCl 57 mL/min Platelet count 148 K   typically hold Xarelto for 3 days prior to this type of procedures, Please resume Eliquis as soon as safely possible post procedure, pt is at elevated risk off of anticoagulation based on history of afib with TIA.   **This guidance is not considered finalized until pre-operative APP has relayed final recommendations.**

## 2024-01-18 NOTE — Telephone Encounter (Signed)
   Name: [name] [name]  DOB: 07/20/1961  MRN: [id number]   Primary Cardiologist: Charlton Haws, MD  Chart reviewed as part of pre-operative protocol coverage. [name] was last seen on 08/26/2023 by Dr. Gasper Lloyd.  At that time, patient was doing well with improvement in dyspnea, fatigue and exertional capacity.  Per Dr. Gasper Lloyd "low risk to proceed."  Therefore, based on ACC/AHA guidelines, the patient would be an acceptable risk for the planned procedure without further cardiovascular testing.   Per Pharm D, patient may hold Xarelto for 3 days prior to procedure.  Please resume Xarelto as soon as safely possible postprocedure, patient is at elevated risk off of anticoagulation based on history of A-fib with TIA.  I will route this recommendation to the requesting party via Epic fax function and remove from pre-op pool. Please call with questions.  Carlos Levering, NP 01/18/2024, 12:15 PM

## 2024-01-19 ENCOUNTER — Ambulatory Visit
Admission: RE | Admit: 2024-01-19 | Discharge: 2024-01-19 | Disposition: A | Discharge status: Home / Self Care | Payer: PPO | Source: Ambulatory Visit | Attending: Orthopaedic Surgery | Admitting: Orthopaedic Surgery

## 2024-01-19 DIAGNOSIS — G8929 Other chronic pain: Secondary | ICD-10-CM

## 2024-01-25 ENCOUNTER — Ambulatory Visit (INDEPENDENT_AMBULATORY_CARE_PROVIDER_SITE_OTHER): Payer: PPO | Admitting: Family Medicine

## 2024-01-25 ENCOUNTER — Encounter: Payer: Self-pay | Admitting: Family Medicine

## 2024-01-25 VITALS — BP 110/70 | HR 57 | Temp 98.0°F | Resp 18 | Ht 70.0 in | Wt 260.2 lb

## 2024-01-25 DIAGNOSIS — Z940 Kidney transplant status: Secondary | ICD-10-CM | POA: Diagnosis not present

## 2024-01-25 DIAGNOSIS — G8929 Other chronic pain: Secondary | ICD-10-CM

## 2024-01-25 DIAGNOSIS — I5032 Chronic diastolic (congestive) heart failure: Secondary | ICD-10-CM

## 2024-01-25 DIAGNOSIS — M25512 Pain in left shoulder: Secondary | ICD-10-CM | POA: Diagnosis not present

## 2024-01-25 NOTE — Patient Instructions (Addendum)
 It was a pleasure meeting you today. Thank you for allowing me to take part in your health care.  Our goals for today as we discussed include:  [facility] with [facility] for cardiology clearance for upcoming shoulder surgery  If you need anything please let me know.   Recommend Pneumonia 20 vaccine. Recommend RSV vaccine.   Both are 1 time vaccinations.  Can check with your Nephrologist as well   Recent A1c 6.4.  Good diabetes control Continue to work on diet.  Can recheck in 3 months  This is a list of the screening recommended for you and due dates:  Health Maintenance  Topic Date Due   Complete foot exam   Never done   Eye exam for diabetics  Never done   Zoster (Shingles) Vaccine (1 of 2) Never done   Colon Cancer Screening  11/29/2017   COVID-19 Vaccine (2 - Pfizer risk series) 12/28/2020   Pneumonia Vaccine (3 of 3 - PPSV23 or PCV20) 09/19/2021   Medicare Annual Wellness Visit  06/10/2022   Hemoglobin A1C  01/24/2024   Flu Shot  02/29/2024*   DTaP/Tdap/Td vaccine (6 - Td or Tdap) 07/04/2027   Hepatitis C Screening  Completed   HIV Screening  Completed   HPV Vaccine  Aged Out  *Topic was postponed. The date shown is not the original due date.      If you have any questions or concerns, please do not hesitate to call the office at [phone].  I look forward to our next visit and until then take care and stay safe.  Regards,   Dana Allan, MD   [facility]

## 2024-01-25 NOTE — Progress Notes (Signed)
 SUBJECTIVE:   Chief Complaint  Patient presents with   Medical Clearance   HPI Presents to clinic for surgical clearance  Discussed the use of AI scribe software for clinical note transcription with the patient, who gave verbal consent to proceed.  History of Present Illness [name] [name] is a 63 year old male with shoulder issues who presents for pre-surgical clearance. He was referred to Orthopedic surgery for further management of his shoulder condition.  He has a history of shoulder issues, including a tear and a large bone spur, confirmed by MRI. Non-surgical treatments have been ineffective. He has tried shock therapy, which provided temporary relief, and physical therapy, which he found too painful to continue. His therapy sessions were interrupted by COVID-19.  He has a history of heart failure and requires cardiac clearance before surgery.  He is under the care of a nephrologist who has cleared him for surgery. 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 He is cautious about new medications due to his kidney condition and often consults his nephrologist before starting any new treatment.  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 This condition may impact respiratory function and should be considered in the context of surgical planning.  No recent fever. He has not eaten today due to concerns about needing to use the restroom frequently after eating, as he no longer has a gallbladder.   PERTINENT PMH / PSH: As above  OBJECTIVE:  BP 110/70   Pulse (!) 57   Temp 98 F (36.7 C)   Resp 18   Ht 5\' 10"  (1.778 m)   Wt 260 lb 4 oz (118 kg)   SpO2 97%   BMI 37.34 kg/m    Physical Exam Vitals reviewed.  Constitutional:      General: He is not in acute distress.    Appearance: Normal appearance. He is obese. He is not ill-appearing, toxic-appearing or diaphoretic.  Eyes:     General:         Right eye: No discharge.        Left eye: No discharge.  Cardiovascular:     Rate and Rhythm: Normal rate and regular rhythm.     Heart sounds: Normal heart sounds.  Pulmonary:     Effort: Pulmonary effort is normal.     Breath sounds: Normal breath sounds.  Abdominal:     General: Bowel sounds are normal.  Musculoskeletal:        General: Normal range of motion.     Cervical back: Normal range of motion.  Skin:    General: Skin is warm and dry.  Neurological:     Mental Status: He is alert and oriented to person, place, and time. Mental status is at baseline.  Psychiatric:        Mood and Affect: Mood normal.        Behavior: Behavior normal.        Thought Content: Thought content normal.        Judgment: Judgment normal.           01/25/2024    3:09 PM 11/16/2023    1:55 PM 03/30/2023    2:21 PM  Depression screen PHQ 2/9  Decreased Interest 0 0 0  Down, Depressed, Hopeless 0 0 0  PHQ - 2 Score 0 0 0  Altered sleeping 0 0   Tired, decreased energy 0 0  Change in appetite 0 0   Feeling bad or failure about yourself  0 0   Trouble concentrating 0 0   Moving slowly or fidgety/restless 0 0   Suicidal thoughts 0 0   PHQ-9 Score 0 0   Difficult doing work/chores Not difficult at all Not difficult at all       01/25/2024    3:09 PM 11/16/2023    1:55 PM  GAD 7 : Generalized Anxiety Score  Nervous, Anxious, on Edge 0 0  Control/stop worrying 0 0  Worry too much - different things 0 0  Trouble relaxing 0 0  Restless 0 0  Easily annoyed or irritable 0 0  Afraid - awful might happen 0 0  Total GAD 7 Score 0 0  Anxiety Difficulty Not difficult at all Not difficult at all    ASSESSMENT/PLAN:  Chronic left shoulder pain Assessment & Plan: MRI revealed a tear and large bone spur. Patient has been undergoing shock therapy with some relief. Scheduled for reverse total arthroscopy. -Cardiology clearance required prior to surgery due to high risk  status.   Status post living-donor kidney transplantation Assessment & Plan: Patient is under the care of a nephrologist who has cleared him for surgery. -Continue current nephrology medications.   Chronic diastolic CHF (congestive heart failure) (HCC) Assessment & Plan: Patient has been seen by a cardiologist and is being referred to a heart specialist for further evaluation. -Cardiology clearance required prior to shoulder surgery. -Continue current heart failure medications.    General Health Maintenance -Consider RSV and Shingles vaccines post-surgery, pending clearance from transplant team. -Schedule six-month follow-up appointment. -Notify provider about cardiology referral outcome.   PDMP reviewed  Return in about 6 months (around 07/24/2024) for PCP, DM.  Dana Allan, MD

## 2024-01-29 ENCOUNTER — Other Ambulatory Visit: Payer: Self-pay

## 2024-01-29 ENCOUNTER — Emergency Department: Payer: PPO

## 2024-01-29 ENCOUNTER — Emergency Department
Admission: EM | Admit: 2024-01-29 | Discharge: 2024-01-29 | Disposition: A | Discharge status: Home / Self Care | Payer: PPO | Attending: Emergency Medicine | Admitting: Emergency Medicine

## 2024-01-29 DIAGNOSIS — I4891 Unspecified atrial fibrillation: Secondary | ICD-10-CM | POA: Diagnosis not present

## 2024-01-29 DIAGNOSIS — Z79899 Other long term (current) drug therapy: Secondary | ICD-10-CM | POA: Insufficient documentation

## 2024-01-29 DIAGNOSIS — I120 Hypertensive chronic kidney disease with stage 5 chronic kidney disease or end stage renal disease: Secondary | ICD-10-CM | POA: Insufficient documentation

## 2024-01-29 DIAGNOSIS — Z7901 Long term (current) use of anticoagulants: Secondary | ICD-10-CM | POA: Insufficient documentation

## 2024-01-29 DIAGNOSIS — N186 End stage renal disease: Secondary | ICD-10-CM | POA: Diagnosis not present

## 2024-01-29 DIAGNOSIS — Z940 Kidney transplant status: Secondary | ICD-10-CM | POA: Insufficient documentation

## 2024-01-29 DIAGNOSIS — R002 Palpitations: Secondary | ICD-10-CM | POA: Diagnosis present

## 2024-01-29 LAB — BASIC METABOLIC PANEL
Anion gap: 11 (ref 5–15)
BUN: 24 mg/dL — ABNORMAL HIGH (ref 8–23)
CO2: 21 mmol/L — ABNORMAL LOW (ref 22–32)
Calcium: 9.3 mg/dL (ref 8.9–10.3)
Chloride: 104 mmol/L (ref 98–111)
Creatinine, Ser: 1.78 mg/dL — ABNORMAL HIGH (ref 0.61–1.24)
GFR, Estimated: 42 mL/min — ABNORMAL LOW (ref >60)
Glucose, Bld: 126 mg/dL — ABNORMAL HIGH (ref 70–99)
Potassium: 4.1 mmol/L (ref 3.5–5.1)
Sodium: 136 mmol/L (ref 135–145)

## 2024-01-29 LAB — CBC
HCT: 45.4 % (ref 39.0–52.0)
Hemoglobin: 14.8 g/dL (ref 13.0–17.0)
MCH: 31.1 pg (ref 26.0–34.0)
MCHC: 32.6 g/dL (ref 30.0–36.0)
MCV: 95.4 fL (ref 80.0–100.0)
Platelets: 147 10*3/uL — ABNORMAL LOW (ref 150–400)
RBC: 4.76 MIL/uL (ref 4.22–5.81)
RDW: 13.1 % (ref 11.5–15.5)
WBC: 7.8 10*3/uL (ref 4.0–10.5)
nRBC: 0 % (ref 0.0–0.2)

## 2024-01-29 LAB — TROPONIN I (HIGH SENSITIVITY): Troponin I (High Sensitivity): 22 ng/L — ABNORMAL HIGH (ref <18)

## 2024-01-29 MED ORDER — METOPROLOL TARTRATE 5 MG/5ML IV SOLN
10.0000 mg | Freq: Once | INTRAVENOUS | Status: AC
  Administered 2024-01-29: 10 mg via INTRAVENOUS
  Filled 2024-01-29: qty 10

## 2024-01-29 NOTE — Discharge Instructions (Signed)
 As discussed, if you have another episode of rapid atrial fibrillation, you should check your blood pressure and if it is normal, you may take an extra dose of the metoprolol.  Otherwise coming to the hospital.  Call your cardiologist for follow-up.

## 2024-01-29 NOTE — ED Triage Notes (Signed)
 Pt reports history of Afib with ablation back in October 2023. Pt reports he was lying on bed when he felt palpitations, pt reports he checks his pulse and felt skipping a beat. Pt also reports history of kidney transplant back in 2020. Pt reports pain to left shoulder. Pt talks in complete sentences no respiratory distress noted

## 2024-01-29 NOTE — ED Provider Notes (Signed)
 [facility] Provider Note    Event Date/Time   First MD Initiated Contact with Patient 01/29/24 2202     (approximate)   History   Palpitations   HPI  [name] [name] is a 63 y.o. male with history of ESRD status post kidney transplant, hypertension, and atrial fibrillation who presents with palpitations, acute onset 2 hours ago, and identical to prior episodes of rapid atrial fibrillation.  The patient takes metoprolol and is on Xarelto.  He states he has been compliant with both.  He denies any chest pain or lightheadedness.  He has no significant shortness of breath.  The patient reports that he previously has always needed to be cardioverted when he went into rapid A-fib, although he has recently had an ablation done.  I reviewed the past medical records.  The patient's most recent outpatient encounter was on 2/24 with primary care for follow-up.  The patient has no recent hospitalizations.   Physical Exam   Triage Vital Signs: ED Triage Vitals  Encounter Vitals Group     BP 01/29/24 2159 (!) 131/94     Systolic BP Percentile --      Diastolic BP Percentile --      Pulse Rate 01/29/24 2159 (!) 149     Resp 01/29/24 2159 16     Temp 01/29/24 2159 98 F (36.7 C)     Temp Source 01/29/24 2159 Oral     SpO2 01/29/24 2159 96 %     Weight 01/29/24 2200 258 lb (117 kg)     Height 01/29/24 2200 5\' 10"  (1.778 m)     Head Circumference --      Peak Flow --      Pain Score 01/29/24 2200 0     Pain Loc --      Pain Education --      Exclude from Growth Chart --     Most recent vital signs: Vitals:   01/29/24 2241 01/29/24 2242  BP:    Pulse: 74 69  Resp: 18 18  Temp:    SpO2: 96% 97%     General: Alert, well-appearing, no distress.  CV:  Good peripheral perfusion.  Tachycardic, irregular rhythm. Resp:  Normal effort.  Abd:  No distention.  Other:  No peripheral edema.   ED Results / Procedures / Treatments   Labs (all labs ordered  are listed, but only abnormal results are displayed) Labs Reviewed  BASIC METABOLIC PANEL - Abnormal; Notable for the following components:      Result Value   CO2 21 (*)    Glucose, Bld 126 (*)    BUN 24 (*)    Creatinine, Ser 1.78 (*)    GFR, Estimated 42 (*)    All other components within normal limits  CBC - Abnormal; Notable for the following components:   Platelets 147 (*)    All other components within normal limits  TROPONIN I (HIGH SENSITIVITY) - Abnormal; Notable for the following components:   Troponin I (High Sensitivity) 22 (*)    All other components within normal limits     EKG  ED ECG REPORT I, Dionne Bucy, the attending physician, personally viewed and interpreted this ECG.  Date: 01/29/2024 EKG Time: 2158 Rate: 148 Rhythm: Atrial fibrillation with RVR QRS Axis: normal Intervals: normal ST/T Wave abnormalities: None specific ST abnormalities Narrative Interpretation: Rapid A-fib with no evidence of acute ischemia   ED ECG REPORT I, Dionne Bucy, the attending physician, personally  viewed and interpreted this ECG.  Date: 01/29/2024 EKG Time: 2234 Rate: 75 Rhythm: normal sinus rhythm QRS Axis: normal Intervals: normal ST/T Wave abnormalities: normal Narrative Interpretation: no evidence of acute ischemia   RADIOLOGY  Chest x-ray: I independently viewed and interpreted the images; there is atelectasis at the right base with no focal consolidation or edema  PROCEDURES:  Critical Care performed: Yes, see critical care procedure note(s)  .Critical Care  Performed by: Dionne Bucy, MD Authorized by: Dionne Bucy, MD   Critical care provider statement:    Critical care time (minutes):  30   Critical care time was exclusive of:  Separately billable procedures and treating other patients   Critical care was necessary to treat or prevent imminent or life-threatening deterioration of the following conditions:  Cardiac  failure   Critical care was time spent personally by me on the following activities:  Development of treatment plan with patient or surrogate, discussions with consultants, evaluation of patient's response to treatment, examination of patient, ordering and review of laboratory studies, ordering and review of radiographic studies, ordering and performing treatments and interventions, pulse oximetry, re-evaluation of patient's condition, review of old charts and obtaining history from patient or surrogate    MEDICATIONS ORDERED IN ED: Medications  metoprolol tartrate (LOPRESSOR) injection 10 mg (10 mg Intravenous Given 01/29/24 2229)     IMPRESSION / MDM / ASSESSMENT AND PLAN / ED COURSE  I reviewed the triage vital signs and the nursing notes.  63 year old male with PMH as noted above presents with atrial fibrillation with RVR with an acute onset 2 hours ago.  The patient has required cardioversion for previous similar episodes.  He is on anticoagulation and states he has been consistent about taking it.  Differential diagnosis includes, but is not limited to, atrial fibrillation with RVR, other tachydysrhythmia.  Patient's presentation is most consistent with acute presentation with potential threat to life or bodily function.  The patient is on the cardiac monitor to evaluate for evidence of arrhythmia and/or significant heart rate changes.  Although IV AV nodal blockers have not worked previously, based on discussion with the patient I think it would be reasonable to try a dose of IV metoprolol and then proceed to cardioversion if this does not work.  I consulted and discussed the case with Dr. Lalla Brothers from cardiology who agrees with this plan.  ----------------------------------------- 10:48 PM on 01/29/2024 -----------------------------------------  The patient converted to sinus rhythm after the IV metoprolol, while we were setting up for cardioversion.  We are awaiting labs.  I  anticipate discharge home.  ----------------------------------------- 11:13 PM on 01/29/2024 -----------------------------------------  BMP is unremarkable and shows a stable creatinine.  CBC is normal.  Troponin is 22 which appears to be the baseline for the patient.  He remains asymptomatic and in sinus rhythm.  He is stable for discharge home.  Return precautions provided, and he expresses understanding.   FINAL CLINICAL IMPRESSION(S) / ED DIAGNOSES   Final diagnoses:  Atrial fibrillation with rapid ventricular response (HCC)     Rx / DC Orders   ED Discharge Orders     None        Note:  This document was prepared using Dragon voice recognition software and may include unintentional dictation errors.    Dionne Bucy, MD 01/29/24 [phone]

## 2024-01-31 ENCOUNTER — Encounter: Payer: Self-pay | Admitting: Family Medicine

## 2024-01-31 NOTE — Assessment & Plan Note (Signed)
 MRI revealed a tear and large bone spur. Patient has been undergoing shock therapy with some relief. Scheduled for reverse total arthroscopy. -Cardiology clearance required prior to surgery due to high risk status.

## 2024-01-31 NOTE — Assessment & Plan Note (Signed)
 Patient has been seen by a cardiologist and is being referred to a heart specialist for further evaluation. -Cardiology clearance required prior to shoulder surgery. -Continue current heart failure medications.

## 2024-01-31 NOTE — Assessment & Plan Note (Signed)
 Patient is under the care of a nephrologist who has cleared him for surgery. -Continue current nephrology medications.

## 2024-02-10 ENCOUNTER — Ambulatory Visit: Payer: PPO | Admitting: Dermatology

## 2024-02-10 ENCOUNTER — Ambulatory Visit: Payer: PPO | Admitting: Family Medicine

## 2024-02-16 ENCOUNTER — Encounter (HOSPITAL_COMMUNITY): Payer: PPO | Admitting: Cardiology

## 2024-02-16 NOTE — Progress Notes (Signed)
 [facility] NOTE  Referring Physician: Dana Allan, MD  Primary Care: Dana Allan, MD Primary Cardiologist: Dr. Eden Emms  CC: High output heart failure  HPI: [name] [name] is a 63 y.o. 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  His cardiac history dates back to at least 2023 when he was diagnosed with atrial fibrillation and had an AF ablation.  His history of ESRD dates back to at least June 2017.  In 2020 he had a renal transplant and currently follows at [facility] for [facility].  Since that time he has been followed by Dr. Eden Emms.  Due to persistent shortness of breath I had a performed a right heart catheterization on him which was consistent with high-output heart failure.  During the cath we inflated a blood pressure cuff over the fistula leading to improvement in outputs.  Since that time he has had his AV fistula banded by Dr. Randie Heinz on 03/31/2023.   Interval hx:  - From a functional standpoint he has been doing very well. His only concern today is his ligated AVF and whether it will go away or get 'reabsorbed'.  - He is planning to undergo complete left shoulder replacement; however, requires pre-op clearance.   Activity level/exercise tolerance:  NYHA II, significant improvement Orthopnea:  Sleeps on 1 pillows Paroxysmal noctural dyspnea:  No Chest pain/pressure:  No Orthostatic lightheadedness:  No Palpitations:  No Lower extremity edema:  No Presyncope/syncope:  No Cough:  No  Current Outpatient Medications  Medication Sig Dispense Refill   atorvastatin (LIPITOR) 40 MG tablet Take 1 tablet (40 mg total) by mouth daily. 90 tablet 3   diphenhydramine-acetaminophen (TYLENOL PM) 25-500 MG TABS tablet Take 2 tablets by mouth at bedtime.     ENVARSUS XR 1 MG TB24 Take 5  mg by mouth in the morning.     furosemide (LASIX) 40 MG tablet Take 20 mg by mouth daily. Prescribed 01/30/23 Dr Margretta Sidle (nephrology)     LISINOPRIL PO Take 1 tablet by mouth daily.     losartan (COZAAR) 25 MG tablet Take 25 mg by mouth daily.     magnesium oxide (MAG-OX) 400 MG tablet Take 400 mg by mouth 2 (two) times daily. Lunch and supper     Menthol (BIOFREEZE) 10.5 % AERO Apply 1 spray topically daily as needed (shoulder pain.).     metoprolol succinate (TOPROL-XL) 25 MG 24 hr tablet Take 1 tablet (25 mg total) by mouth 2 (two) times daily. 60 tablet 3   mycophenolate (MYFORTIC) 180 MG EC tablet Take 540 mg by mouth 2 (two) times daily.     omeprazole (PRILOSEC) 20 MG capsule Take 20 mg by mouth daily as needed (acid reflux).     predniSONE (DELTASONE) 5 MG tablet Take 5 mg by mouth in the morning.     sulfamethoxazole-trimethoprim (BACTRIM) 400-80 MG tablet Take 1 tablet by mouth every Monday, Wednesday, and Friday. In the morning     XARELTO 20 MG TABS tablet Take 1 tablet (20 mg total) by mouth daily with supper. 90 tablet 3   Current Facility-Administered Medications  Medication Dose Route Frequency Provider Last Rate Last Admin   sodium chloride flush (NS) 0.9 % injection 3 mL  3 mL Intravenous Q12H Wendall Stade, MD       PHYSICAL EXAM: Vitals:   02/18/24 1104  BP: 104/70  Pulse: (!) 56  SpO2: 97%   GENERAL: NAD Lungs- CTA CARDIAC:  JVP: 6 cm          Normal rate with regular rhythm. No murmur.  Pulses 2+. No edema.  ABDOMEN: Soft, non-tender, non-distended.  EXTREMITIES: Warm and well perfused.  NEUROLOGIC: No obvious FND   DATA REVIEW  ECG: 07/01/23: NSR  As per my personal interpretation  ECHO: 02/26/23: LVEF 60-65% with normal RV function. RVSp .  As per my personal interpretation 09/14/23: LVEF 55-60%.   CATH: HEMODYNAMICS: RA:                  6 mmHg (mean) RV:                  50/10 mmHg PA:                  54/14 mmHg (35 mean) PCWP:             15-20 with V waves (difficulty completing occluding for PCWP)                                       Thermodilution CO/CI 12.7 L/min, 5.7 L/min/m2 PA sat: 73-74%            NIBP: 147/70                                      TPG                 15  mmHg                                            PVR                 1.2 Wood Units  PAPi                6.7     Hemodynamics after inflating cuff to 120-130 systolic PA: 42/10 (33) Thermodilution: 9.68 L/min, 4.18 L/min/m2   IMPRESSION: Normal pre and post capillary filling pressures Moderately elevated PA mean with normal PVR likely secondary to high output.  Elevated cardiac output / cardiac index consistent with high output heart failure secondary to AVF.  After inflation of manual cuff to 120-130 systolic for 3 minutes there was a reduction in PA systolic and a reduction in thermodilution cardiac output by 3 L/min.   ASSESSMENT & PLAN:  High output heart failure - Initially referred for RHC by Dr. Eden Emms. RHC as above with output of 12.7L/min by thermodilution. Improved by 30% after inflating cuff.  - Reports significant improvement in dyspnea now after ligation and excision of fistula..  - Plan on repeat TTE to re-assess LV/RV function. Doing very well from a functional standpoint now. - AVF banding on 03/31/23. As per op report, fistula was very large and banded around a 6mm balloon leading to only mild distal flow. - Presented with large pseudoaneurysm of the proximal fistula that is now s/p complete excision on 06/23/23.  - Right UE pseudoaneurysm stable; no flow on auscultation.   2. Atrial fibrillation - Followed by Dr. Lalla Brothers.  - Has remained in atrial fibrillation  since 09/23/23. - He had another episode of atrial fibrillation; unsure if he is having atrial fibrillation again. Will rx 2 week ziopatch.    3. ESRD s/p renal transplant - followed by Dr. Jean Rosenthal at [facility] on 01/13/19  4. Should replacement.   - Low risk to proceed with shoulder replacement.  - Will provide surgical risk stratification letter today.    Yarel Rushlow [facility] Mechanical Circulatory Support

## 2024-02-18 ENCOUNTER — Ambulatory Visit (HOSPITAL_COMMUNITY)
Admission: RE | Admit: 2024-02-18 | Discharge: 2024-02-18 | Disposition: A | Discharge status: Home / Self Care | Payer: PPO | Source: Ambulatory Visit | Attending: Cardiology | Admitting: Cardiology

## 2024-02-18 ENCOUNTER — Other Ambulatory Visit (HOSPITAL_COMMUNITY): Payer: Self-pay | Admitting: Cardiology

## 2024-02-18 ENCOUNTER — Inpatient Hospital Stay (HOSPITAL_COMMUNITY)
Admission: RE | Admit: 2024-02-18 | Discharge: 2024-02-18 | Disposition: A | Discharge status: Home / Self Care | Source: Ambulatory Visit | Attending: Cardiology | Admitting: Cardiology

## 2024-02-18 ENCOUNTER — Telehealth: Payer: Self-pay

## 2024-02-18 VITALS — BP 104/70 | HR 56 | Wt 258.8 lb

## 2024-02-18 DIAGNOSIS — R0601 Orthopnea: Secondary | ICD-10-CM | POA: Insufficient documentation

## 2024-02-18 DIAGNOSIS — I5030 Unspecified diastolic (congestive) heart failure: Secondary | ICD-10-CM

## 2024-02-18 DIAGNOSIS — I480 Paroxysmal atrial fibrillation: Secondary | ICD-10-CM

## 2024-02-18 DIAGNOSIS — I4891 Unspecified atrial fibrillation: Secondary | ICD-10-CM | POA: Diagnosis not present

## 2024-02-18 DIAGNOSIS — Z940 Kidney transplant status: Secondary | ICD-10-CM | POA: Insufficient documentation

## 2024-02-18 DIAGNOSIS — I5083 High output heart failure: Secondary | ICD-10-CM | POA: Diagnosis not present

## 2024-02-18 DIAGNOSIS — Z01818 Encounter for other preprocedural examination: Secondary | ICD-10-CM | POA: Diagnosis not present

## 2024-02-18 NOTE — Patient Instructions (Signed)
 There has been no changes to your medications.  Your provider has recommended that  you wear a Zio Patch for 14 days.  This monitor will record your heart rhythm for our review.  IF you have any symptoms while wearing the monitor please press the button.  If you have any issues with the patch or you notice a red or orange light on it please call the company at [phone].  Once you remove the patch please mail it back to the company as soon as possible so we can get the results.   Your physician recommends that you schedule a follow-up appointment in: as needed.  If you have any questions or concerns before your next appointment please send [place] a message through [place] or call our office at [phone].    TO LEAVE A MESSAGE FOR THE NURSE SELECT OPTION 2, PLEASE LEAVE A MESSAGE INCLUDING: YOUR NAME DATE OF BIRTH CALL BACK NUMBER REASON FOR CALL**this is important as we prioritize the call backs  YOU WILL RECEIVE A CALL BACK THE SAME DAY AS LONG AS YOU CALL BEFORE 4:00 PM  At the [facility], you and your health needs are our priority. As part of our continuing mission to provide you with exceptional heart care, we have created designated Provider Care Teams. These Care Teams include your primary Cardiologist (physician) and Advanced Practice Providers (APPs- Physician Assistants and Nurse Practitioners) who all work together to provide you with the care you need, when you need it.   You may see any of the following providers on your designated Care Team at your next follow up: Dr Arvilla Meres Dr Marca Ancona Dr. Dorthula Nettles Dr. Clearnce Hasten Amy Filbert Schilder, NP Robbie Lis, [place] [facility] [place], [place] Brynda Peon, NP [place] Lee, NP Clarisa Kindred, NP Karle Plumber, PharmD Enos Fling, PharmD   Please be sure to bring in all your medications bottles to every appointment.    Thank you for choosing [facility]

## 2024-02-18 NOTE — Telephone Encounter (Signed)
 Copied from CRM [id number]. Topic: General - Other >> Feb 18, 2024 12:21 PM Kathryne Eriksson wrote: Reason for CRM: Medical Clearance >> Feb 18, 2024 12:25 PM Kathryne Eriksson wrote: Alfonso Ramus - Dr. Everardo Pacific Office "[facility]" [phone] Fax [phone]   Called on behalf of patient, states they're needing a medical clearance, optimized from a medical stand point.

## 2024-02-18 NOTE — Telephone Encounter (Signed)
 Left message to return call to our office.

## 2024-02-19 ENCOUNTER — Ambulatory Visit: Payer: PPO | Admitting: Family Medicine

## 2024-02-19 NOTE — Telephone Encounter (Signed)
 Left message to return call to our office.

## 2024-02-19 NOTE — Telephone Encounter (Signed)
 [name] [name] returned office phone call. CMA with patient at time of call.

## 2024-02-24 NOTE — Telephone Encounter (Signed)
 Left message to return call to our office.

## 2024-02-24 NOTE — Telephone Encounter (Signed)
 Spoke to Horace and she stated that he needs to be cleared by his PCP even though cardiology has clear him. I did advise that he has canceled 2 times, and she stated she would call him to get him to call and schedule the surgical clearance.

## 2024-02-24 NOTE — Telephone Encounter (Signed)
 Called pt and advised her that form has been filled ou and faxed back.

## 2024-02-24 NOTE — Telephone Encounter (Signed)
 Completed Surgical clearance has been faxed over

## 2024-02-24 NOTE — Telephone Encounter (Signed)
 Copied from CRM [id number]. Topic: General - Other >> Feb 24, 2024  3:46 PM Rodman Pickle T wrote: Reason for CRM: patient wife is returning a call from the dr Clent Ridges nurse she is just needing the paperwork signed off on for the patient to have surgery on his shoulder she would like a call back patient was seen in the office feb 2025

## 2024-02-29 ENCOUNTER — Other Ambulatory Visit: Payer: Self-pay | Admitting: Cardiovascular Disease

## 2024-03-09 NOTE — Progress Notes (Signed)
 Anesthesia Review:  PCP: Dana Allan LOV 01/25/24  Cardiologist : Doree Albee LOV 02/18/2024  [place]- kidney transplant- [facility]   PPM/ ICD: Device Orders: Rep Notified:  Chest x-ray : 01/29/24- 1 view  EKG : 02/18/24  Echo : 09/14/23  Stress test: Cardiac Cath :  02/24/23  Ablation - 09/25/22  Monitor- 02/18/2024   Activity level:  Sleep Study/ CPAP : Fasting Blood Sugar :      / Checks Blood Sugar -- times a day:    Blood Thinner/ Instructions /Last Dose: ASA / Instructions/ Last Dose :    2020- kidney transplant  Hemiplegia

## 2024-03-09 NOTE — Patient Instructions (Addendum)
 SURGICAL WAITING ROOM VISITATION  Patients having surgery or a procedure may have no more than 2 support people in the waiting area - these visitors may rotate.    Children under the age of 107 must have an adult with them who is not the patient.  Due to an increase in RSV and influenza rates and associated hospitalizations, children ages 79 and under may not visit patients in [facility] hospitals.  Visitors with respiratory illnesses are discouraged from visiting and should remain at home.  If the patient needs to stay at the hospital during part of their recovery, the visitor guidelines for inpatient rooms apply. Pre-op nurse will coordinate an appropriate time for 1 support person to accompany patient in pre-op.  This support person may not rotate.    Please refer to the [facility] website for the visitor guidelines for Inpatients (after your surgery is over and you are in a regular room).       Your procedure is scheduled on:  03/23/24    Report to [facility] Main Entrance    Report to admitting at  0600 AM   Call this number if you have problems the morning of surgery [phone]   Do not eat food :After Midnight.   After Midnight you may have the following liquids until _ __ 0530___ AM  DAY OF SURGERY  Water Non-Citrus Juices (without pulp, NO RED-Apple, White grape, White cranberry) Black Coffee (NO MILK/CREAM OR CREAMERS, sugar ok)  Clear Tea (NO MILK/CREAM OR CREAMERS, sugar ok) regular and decaf                             Plain Jell-O (NO RED)                                           Fruit ices (not with fruit pulp, NO RED)                                     Popsicles (NO RED)                                                               Sports drinks like Gatorade (NO RED)                   The day of surgery:  Drink ONE (1) Pre-Surgery Clear Ensure or G2 at  0530AM  ( have completed by ) the morning of surgery. Drink in one sitting. Do not sip.   This drink was given to you during your hospital  pre-op appointment visit. Nothing else to drink after completing the  Pre-Surgery Clear Ensure or G2.          If you have questions, please contact your surgeon's office.       Oral Hygiene is also important to reduce your risk of infection.  Remember - BRUSH YOUR TEETH THE MORNING OF SURGERY WITH YOUR REGULAR TOOTHPASTE  DENTURES WILL BE REMOVED PRIOR TO SURGERY PLEASE DO NOT APPLY "Poly grip" OR ADHESIVES!!!   Do NOT smoke after Midnight   Stop all vitamins and herbal supplements 7 days before surgery.   Take these medicines the morning of surgery with A SIP OF WATER:    toprol, prednisone   DO NOT TAKE ANY ORAL DIABETIC MEDICATIONS DAY OF YOUR SURGERY  Bring CPAP mask and tubing day of surgery.                              You may not have any metal on your body including hair pins, jewelry, and body piercing             Do not wear make-up, lotions, powders, perfumes/cologne, or deodorant  Do not wear nail polish including gel and S&S, artificial/acrylic nails, or any other type of covering on natural nails including finger and toenails. If you have artificial nails, gel coating, etc. that needs to be removed by a nail salon please have this removed prior to surgery or surgery may need to be canceled/ delayed if the surgeon/ anesthesia feels like they are unable to be safely monitored.   Do not shave  48 hours prior to surgery.               Men may shave face and neck.   Do not bring valuables to the hospital. [facility] IS NOT             RESPONSIBLE   FOR VALUABLES.   Contacts, glasses, dentures or bridgework may not be worn into surgery.   Bring small overnight bag day of surgery.   DO NOT BRING YOUR HOME MEDICATIONS TO THE HOSPITAL. PHARMACY WILL DISPENSE MEDICATIONS LISTED ON YOUR MEDICATION LIST TO YOU DURING YOUR ADMISSION IN THE HOSPITAL!    Patients discharged on the day  of surgery will not be allowed to drive home.  Someone NEEDS to stay with you for the first 24 hours after anesthesia.   Special Instructions: Bring a copy of your healthcare power of attorney and living will documents the day of surgery if you haven't scanned them before.              Please read over the following fact sheets you were given: IF YOU HAVE QUESTIONS ABOUT YOUR PRE-OP INSTRUCTIONS PLEASE CALL [phone]   If you received a COVID test during your pre-op visit  it is requested that you wear a mask when out in public, stay away from anyone that may not be feeling well and notify your surgeon if you develop symptoms. If you test positive for Covid or have been in contact with anyone that has tested positive in the last 10 days please notify you surgeon.      Pre-operative 5 CHG Bath Instructions   You can play a key role in reducing the risk of infection after surgery. Your skin needs to be as free of germs as possible. You can reduce the number of germs on your skin by washing with CHG (chlorhexidine gluconate) soap before surgery. CHG is an antiseptic soap that kills germs and continues to kill germs even after washing.   DO NOT use if you have an allergy to chlorhexidine/CHG or antibacterial soaps. If your skin becomes reddened or irritated, stop using the CHG and notify one of  our RNs at [phone].   Please shower with the CHG soap starting 4 days before surgery using the following schedule:     Please keep in mind the following:  DO NOT shave, including legs and underarms, starting the day of your first shower.   You may shave your face at any point before/day of surgery.  Place clean sheets on your bed the day you start using CHG soap. Use a clean washcloth (not used since being washed) for each shower. DO NOT sleep with pets once you start using the CHG.   CHG Shower Instructions:  If you choose to wash your hair and private area, wash first with your normal  shampoo/soap.  After you use shampoo/soap, rinse your hair and body thoroughly to remove shampoo/soap residue.  Turn the water OFF and apply about 3 tablespoons (45 ml) of CHG soap to a CLEAN washcloth.  Apply CHG soap ONLY FROM YOUR NECK DOWN TO YOUR TOES (washing for 3-5 minutes)  DO NOT use CHG soap on face, private areas, open wounds, or sores.  Pay special attention to the area where your surgery is being performed.  If you are having back surgery, having someone wash your back for you may be helpful. Wait 2 minutes after CHG soap is applied, then you may rinse off the CHG soap.  Pat dry with a clean towel  Put on clean clothes/pajamas   If you choose to wear lotion, please use ONLY the CHG-compatible lotions on the back of this paper.     Additional instructions for the day of surgery: DO NOT APPLY any lotions, deodorants, cologne, or perfumes.   Put on clean/comfortable clothes.  Brush your teeth.  Ask your nurse before applying any prescription medications to the skin.      CHG Compatible Lotions   Aveeno Moisturizing lotion  Cetaphil Moisturizing Cream  Cetaphil Moisturizing Lotion  Clairol Herbal Essence Moisturizing Lotion, Dry Skin  Clairol Herbal Essence Moisturizing Lotion, Extra Dry Skin  Clairol Herbal Essence Moisturizing Lotion, Normal Skin  Curel Age Defying Therapeutic Moisturizing Lotion with Alpha Hydroxy  Curel Extreme Care Body Lotion  Curel Soothing Hands Moisturizing Hand Lotion  Curel Therapeutic Moisturizing Cream, Fragrance-Free  Curel Therapeutic Moisturizing Lotion, Fragrance-Free  Curel Therapeutic Moisturizing Lotion, Original Formula  Eucerin Daily Replenishing Lotion  Eucerin Dry Skin Therapy Plus Alpha Hydroxy Crme  Eucerin Dry Skin Therapy Plus Alpha Hydroxy Lotion  Eucerin Original Crme  Eucerin Original Lotion  Eucerin Plus Crme Eucerin Plus Lotion  Eucerin TriLipid Replenishing Lotion  Keri Anti-Bacterial Hand Lotion  Keri Deep  Conditioning Original Lotion Dry Skin Formula Softly Scented  Keri Deep Conditioning Original Lotion, Fragrance Free Sensitive Skin Formula  Keri Lotion Fast Absorbing Fragrance Free Sensitive Skin Formula  Keri Lotion Fast Absorbing Softly Scented Dry Skin Formula  Keri Original Lotion  Keri Skin Renewal Lotion Keri Silky Smooth Lotion  Keri Silky Smooth Sensitive Skin Lotion  Nivea Body Creamy Conditioning Oil  Nivea Body Extra Enriched Lotion  Nivea Body Original Lotion  Nivea Body Sheer Moisturizing Lotion Nivea Crme  Nivea Skin Firming Lotion  NutraDerm 30 Skin Lotion  NutraDerm Skin Lotion  NutraDerm Therapeutic Skin Cream  NutraDerm Therapeutic Skin Lotion  ProShield Protective Hand Cream  Provon moisturizing lotion   [facility]- Preparing for Total Shoulder Arthroplasty    Before surgery, you can play an important role. Because skin is not sterile, your skin needs to be as free of germs as possible. You can reduce the  number of germs on your skin by using the following products. Benzoyl Peroxide Gel Reduces the number of germs present on the skin Applied twice a day to shoulder area starting two days before surgery    ==================================================================  Please follow these instructions carefully:  BENZOYL PEROXIDE 5% GEL  Please do not use if you have an allergy to benzoyl peroxide.   If your skin becomes reddened/irritated stop using the benzoyl peroxide.  Starting two days before surgery, apply as follows: Apply benzoyl peroxide in the morning and at night. Apply after taking a shower. If you are not taking a shower clean entire shoulder front, back, and side along with the armpit with a clean wet washcloth.  Place a quarter-sized dollop on your shoulder and rub in thoroughly, making sure to cover the front, back, and side of your shoulder, along with the armpit.   2 days before ____ AM   ____ PM              1 day before ____ AM    ____ PM                         Do this twice a day for two days.  (Last application is the night before surgery, AFTER using the CHG soap as described below).  Do NOT apply benzoyl peroxide gel on the day of surgery.

## 2024-03-10 ENCOUNTER — Encounter (HOSPITAL_COMMUNITY)
Admission: RE | Admit: 2024-03-10 | Discharge: 2024-03-10 | Disposition: A | Discharge status: Home / Self Care | Source: Ambulatory Visit | Attending: Orthopaedic Surgery | Admitting: Orthopaedic Surgery

## 2024-03-10 ENCOUNTER — Other Ambulatory Visit: Payer: Self-pay

## 2024-03-10 ENCOUNTER — Encounter (HOSPITAL_COMMUNITY): Payer: Self-pay

## 2024-03-10 VITALS — BP 138/98 | HR 54 | Temp 98.3°F | Resp 16 | Ht 70.0 in | Wt 257.9 lb

## 2024-03-10 DIAGNOSIS — Z01812 Encounter for preprocedural laboratory examination: Secondary | ICD-10-CM | POA: Insufficient documentation

## 2024-03-10 DIAGNOSIS — I4891 Unspecified atrial fibrillation: Secondary | ICD-10-CM | POA: Diagnosis not present

## 2024-03-10 DIAGNOSIS — Z940 Kidney transplant status: Secondary | ICD-10-CM | POA: Diagnosis not present

## 2024-03-10 DIAGNOSIS — N189 Chronic kidney disease, unspecified: Secondary | ICD-10-CM | POA: Insufficient documentation

## 2024-03-10 DIAGNOSIS — I509 Heart failure, unspecified: Secondary | ICD-10-CM | POA: Insufficient documentation

## 2024-03-10 DIAGNOSIS — Z01818 Encounter for other preprocedural examination: Secondary | ICD-10-CM

## 2024-03-10 DIAGNOSIS — D849 Immunodeficiency, unspecified: Secondary | ICD-10-CM | POA: Insufficient documentation

## 2024-03-10 HISTORY — DX: Prediabetes: R73.03

## 2024-03-10 LAB — BASIC METABOLIC PANEL WITH GFR
Anion gap: 9 (ref 5–15)
BUN: 16 mg/dL (ref 8–23)
CO2: 26 mmol/L (ref 22–32)
Calcium: 9.5 mg/dL (ref 8.9–10.3)
Chloride: 102 mmol/L (ref 98–111)
Creatinine, Ser: 1.65 mg/dL — ABNORMAL HIGH (ref 0.61–1.24)
GFR, Estimated: 46 mL/min — ABNORMAL LOW (ref >60)
Glucose, Bld: 117 mg/dL — ABNORMAL HIGH (ref 70–99)
Potassium: 4.1 mmol/L (ref 3.5–5.1)
Sodium: 137 mmol/L (ref 135–145)

## 2024-03-10 LAB — SURGICAL PCR SCREEN
MRSA, PCR: NEGATIVE
Staphylococcus aureus: NEGATIVE

## 2024-03-10 LAB — CBC
HCT: 46.2 % (ref 39.0–52.0)
Hemoglobin: 14.4 g/dL (ref 13.0–17.0)
MCH: 30.4 pg (ref 26.0–34.0)
MCHC: 31.2 g/dL (ref 30.0–36.0)
MCV: 97.7 fL (ref 80.0–100.0)
Platelets: 149 10*3/uL — ABNORMAL LOW (ref 150–400)
RBC: 4.73 MIL/uL (ref 4.22–5.81)
RDW: 13.3 % (ref 11.5–15.5)
WBC: 5.7 10*3/uL (ref 4.0–10.5)
nRBC: 0 % (ref 0.0–0.2)

## 2024-03-11 NOTE — Anesthesia Preprocedure Evaluation (Signed)
 Anesthesia Evaluation    Airway        Dental   Pulmonary           Cardiovascular hypertension,      Neuro/Psych    GI/Hepatic   Endo/Other    Renal/GU      Musculoskeletal   Abdominal   Peds  Hematology   Anesthesia Other Findings   Reproductive/Obstetrics                              Anesthesia Physical Anesthesia Plan  ASA:   Anesthesia Plan:    Post-op Pain Management:    Induction:   PONV Risk Score and Plan:   Airway Management Planned:   Additional Equipment:   Intra-op Plan:   Post-operative Plan:   Informed Consent:   Plan Discussed with:   Anesthesia Plan Comments: (PAT note by Antionette Poles, PA-C: 63 year old male follows with cardiology for history of A-fib s/p ablation 8/23, right hemidiaphragmatic paralysis, CKD s/p renal transplant 2020, 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  Echo 09/2023 showed EF 55 to 60%, normal RV systolic function, normal PASP, no significant valvular abnormalities.  Event monitor 01/2024 showed underlying rhythm to be sinus, 23 SVT runs occurred fastest interval lasting 7 beats with a max rate of 188, longest lasting 14.3 seconds with an average rate of 138, rare supraventricular and ventricular ectopy.  Seen 02/18/2024 by Dr. Gasper Lloyd for preop evaluation.  Note states, "Mr. Mccabe is considered to be low risk for surgery from a cardiac point of view."  Follows with transplant nephrology at [facility] for history of LUDKTx in 01/2019.  Last seen 12/09/2018 and noted to have stable allograft function with baseline creatinine 1.9-2.1.  He is maintained on immunosuppression with Envarsus 5 mg, Myfortic 540/540, prednisone 5 mg daily.  Patient reports last dose Xarelto 03/19/2024.  Preop labs reviewed, creatinine 1.6 consistent with history of CKD, otherwise unremarkable.  EKG 02/18/2024: Sinus bradycardia.  Rate  56.  Event monitor 01/2024: Patient had a min HR of 35 bpm, max HR of 188 bpm, and avg HR of 57 bpm. Predominant underlying rhythm was Sinus Rhythm. 23 Supraventricular Tachycardia runs occurred, the run with the fastest interval lasting 7 beats with a max rate of 188 bpm, the  longest lasting 14.3 secs with an avg rate of 138 bpm. Isolated SVEs were rare (<1.0%), SVE Couplets were rare (<1.0%), and SVE Triplets were rare (<1.0%). Isolated VEs were rare (<1.0%), VE Couplets were rare (<1.0%), and no VE Triplets were present.   TTE 09/14/2023 1. Left ventricular ejection fraction, by estimation, is 55 to 60%. The  left ventricle has normal function. The left ventricle has no regional  wall motion abnormalities. There is mild asymmetric left ventricular  hypertrophy of the septal segment. Left  ventricular diastolic parameters were normal.   2. Right ventricular systolic function is normal. The right ventricular  size is normal. There is normal pulmonary artery systolic pressure. The  estimated right ventricular systolic pressure is 28.4 mmHg.   3. The mitral valve is abnormal. Trivial mitral valve regurgitation.   4. The aortic valve is tricuspid. Aortic valve regurgitation is trivial.   5. Aortic dilatation noted. There is borderline dilatation of the aortic  root, measuring 39 mm. There is mild dilatation of the ascending aorta,  measuring 40 mm.   6. The inferior vena cava is normal in size with greater than 50%  respiratory variability, suggesting right atrial pressure of 3 mmHg.   Comparison(s): Prior images reviewed side by side. Aortic dimensions are  measured smaller than prior.   Nuclear stress 02/06/2020 (Care Everywhere): 1.  MYOCARDIAL PERFUSION: No evidence of inducible ischemia or scar/hibernating myocardium.  2.  LEFT VENTRICULAR EJECTION FRACTION: Normal. However, the heart is dilated.  3.  REGIONAL WALL MOTION: Septal dyskinesis, otherwise motion within normal limits.   )          Anesthesia Quick Evaluation

## 2024-03-11 NOTE — Progress Notes (Signed)
 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  Echo 09/2023 showed EF 55 to 60%, normal RV systolic function, normal PASP, no significant valvular abnormalities.  Event monitor 01/2024 showed underlying rhythm to be sinus, 23 SVT runs occurred fastest interval lasting 7 beats with a max rate of 188, longest lasting 14.3 seconds with an average rate of 138, rare supraventricular and ventricular ectopy.  Seen 02/18/2024 by Dr. Gasper Lloyd for preop evaluation.  Note states, "[name] is considered to be low risk for surgery from a cardiac point of view."  Follows with transplant nephrology at [facility] for history of LUDKTx in 01/2019.  Last seen 12/09/2018 and noted to have stable allograft function with baseline creatinine 1.9-2.1.  He is maintained on immunosuppression with Envarsus 5 mg, Myfortic 540/540, prednisone 5 mg daily.  Patient reports last dose Xarelto 03/19/2024.  Preop labs reviewed, creatinine 1.6 consistent with history of CKD, otherwise unremarkable.  EKG 02/18/2024: Sinus bradycardia.  Rate 56.  Event monitor 01/2024: Patient had a min HR of 35 bpm, max HR of 188 bpm, and avg HR of 57 bpm. Predominant underlying rhythm was Sinus Rhythm. 23 Supraventricular Tachycardia runs occurred, the run with the fastest interval lasting 7 beats with a max rate of 188 bpm, the  longest lasting 14.3 secs with an avg rate of 138 bpm. Isolated SVEs were rare (<1.0%), SVE Couplets were rare (<1.0%), and SVE Triplets were rare (<1.0%). Isolated VEs were rare (<1.0%), VE Couplets were rare (<1.0%), and no VE Triplets were present.   TTE 09/14/2023 1. Left ventricular ejection fraction, by estimation, is 55 to 60%. The  left ventricle has normal function. The left ventricle has no regional  wall motion abnormalities.  There is mild asymmetric left ventricular  hypertrophy of the septal segment. Left  ventricular diastolic parameters were normal.   2. Right ventricular systolic function is normal. The right ventricular  size is normal. There is normal pulmonary artery systolic pressure. The  estimated right ventricular systolic pressure is 28.4 mmHg.   3. The mitral valve is abnormal. Trivial mitral valve regurgitation.   4. The aortic valve is tricuspid. Aortic valve regurgitation is trivial.   5. Aortic dilatation noted. There is borderline dilatation of the aortic  root, measuring 39 mm. There is mild dilatation of the ascending aorta,  measuring 40 mm.   6. The inferior vena cava is normal in size with greater than 50%  respiratory variability, suggesting right atrial pressure of 3 mmHg.   Comparison(s): Prior images reviewed side by side. Aortic dimensions are  measured smaller than prior.   Nuclear stress 02/06/2020 (Care Everywhere): 1.  MYOCARDIAL PERFUSION: No evidence of inducible ischemia or scar/hibernating myocardium.  2.  LEFT VENTRICULAR EJECTION FRACTION: Normal. However, the heart is dilated.  3.  REGIONAL WALL MOTION: Septal dyskinesis, otherwise motion within normal limits.     Zannie Cove [facility] Short Stay Center/Anesthesiology Phone [phone] 03/11/2024 12:54 PM

## 2024-03-14 ENCOUNTER — Telehealth: Payer: Self-pay

## 2024-03-14 NOTE — Telephone Encounter (Signed)
 Pt needs a TOC appt and for A1C compliance

## 2024-03-14 NOTE — Telephone Encounter (Signed)
 Noted.

## 2024-03-14 NOTE — Telephone Encounter (Signed)
 Error

## 2024-03-14 NOTE — Telephone Encounter (Signed)
 I left a voicemail for patient asking him to please call us  to schedule a TOC appointment, as Dr. Valli Gaw will be leaving our practice at the end of June.  E2C2 - when patient returns our call, please schedule a transfer of care appointment for him with one of our providers who are accepting new patients and include in the appointment note, "A1c may be needed".

## 2024-03-15 ENCOUNTER — Other Ambulatory Visit: Payer: Self-pay

## 2024-03-15 ENCOUNTER — Emergency Department
Admission: EM | Admit: 2024-03-15 | Discharge: 2024-03-15 | Disposition: A | Discharge status: Home / Self Care | Attending: Emergency Medicine | Admitting: Emergency Medicine

## 2024-03-15 ENCOUNTER — Emergency Department

## 2024-03-15 DIAGNOSIS — R000 Tachycardia, unspecified: Secondary | ICD-10-CM | POA: Diagnosis not present

## 2024-03-15 DIAGNOSIS — Z7901 Long term (current) use of anticoagulants: Secondary | ICD-10-CM | POA: Diagnosis not present

## 2024-03-15 DIAGNOSIS — R002 Palpitations: Secondary | ICD-10-CM | POA: Diagnosis present

## 2024-03-15 DIAGNOSIS — Z940 Kidney transplant status: Secondary | ICD-10-CM | POA: Insufficient documentation

## 2024-03-15 DIAGNOSIS — R001 Bradycardia, unspecified: Secondary | ICD-10-CM | POA: Insufficient documentation

## 2024-03-15 DIAGNOSIS — I120 Hypertensive chronic kidney disease with stage 5 chronic kidney disease or end stage renal disease: Secondary | ICD-10-CM | POA: Insufficient documentation

## 2024-03-15 DIAGNOSIS — N186 End stage renal disease: Secondary | ICD-10-CM | POA: Diagnosis not present

## 2024-03-15 DIAGNOSIS — I4891 Unspecified atrial fibrillation: Secondary | ICD-10-CM | POA: Insufficient documentation

## 2024-03-15 LAB — CBC
HCT: 46.8 % (ref 39.0–52.0)
Hemoglobin: 14.9 g/dL (ref 13.0–17.0)
MCH: 30.5 pg (ref 26.0–34.0)
MCHC: 31.8 g/dL (ref 30.0–36.0)
MCV: 95.7 fL (ref 80.0–100.0)
Platelets: 183 10*3/uL (ref 150–400)
RBC: 4.89 MIL/uL (ref 4.22–5.81)
RDW: 13.2 % (ref 11.5–15.5)
WBC: 6.9 10*3/uL (ref 4.0–10.5)
nRBC: 0 % (ref 0.0–0.2)

## 2024-03-15 LAB — BASIC METABOLIC PANEL WITH GFR
Anion gap: 10 (ref 5–15)
BUN: 20 mg/dL (ref 8–23)
CO2: 23 mmol/L (ref 22–32)
Calcium: 9.1 mg/dL (ref 8.9–10.3)
Chloride: 103 mmol/L (ref 98–111)
Creatinine, Ser: 1.78 mg/dL — ABNORMAL HIGH (ref 0.61–1.24)
GFR, Estimated: 42 mL/min — ABNORMAL LOW (ref >60)
Glucose, Bld: 110 mg/dL — ABNORMAL HIGH (ref 70–99)
Potassium: 4.6 mmol/L (ref 3.5–5.1)
Sodium: 136 mmol/L (ref 135–145)

## 2024-03-15 LAB — TROPONIN I (HIGH SENSITIVITY): Troponin I (High Sensitivity): 23 ng/L — ABNORMAL HIGH (ref <18)

## 2024-03-15 LAB — MAGNESIUM: Magnesium: 1.9 mg/dL (ref 1.7–2.4)

## 2024-03-15 LAB — BRAIN NATRIURETIC PEPTIDE: B Natriuretic Peptide: 159.4 pg/mL — ABNORMAL HIGH (ref 0.0–100.0)

## 2024-03-15 MED ORDER — ETOMIDATE 2 MG/ML IV SOLN
12.0000 mg | Freq: Once | INTRAVENOUS | Status: AC
  Administered 2024-03-15: 12 mg via INTRAVENOUS
  Filled 2024-03-15: qty 10

## 2024-03-15 NOTE — ED Notes (Signed)
 Pt alert, resting comfortably, wife at bedside.

## 2024-03-15 NOTE — Sedation Documentation (Signed)
 Pt still sedated, eyes closed but pt responding verbally and answering questions appropriately. EDP out of room.

## 2024-03-15 NOTE — ED Notes (Signed)
 E signature pad not working. Wife signed for patient on paper consent. Paper consent placed in medical records folder.

## 2024-03-15 NOTE — ED Notes (Signed)
 Wife at bedside talking to pt. Pt is alert with eyes closed. Pt is oriented. Turned from 6L to 4L [place].

## 2024-03-15 NOTE — ED Notes (Signed)
 RT called to come for cardioversion.

## 2024-03-15 NOTE — ED Notes (Signed)
 EDP Funke was at bedside.

## 2024-03-15 NOTE — ED Triage Notes (Signed)
 Pt to ED via POV from home. Pt reports felt like he went into afib around 0100. Pt with hx of afib and is on metoprolol and blood thinner. Pt denies SOB, N/V, HA. Pt hx kidney transplant

## 2024-03-15 NOTE — Sedation Documentation (Signed)
 RT was at bedside and second RT came also, pt was given breaths with ambubag, now just on 6L [place].

## 2024-03-15 NOTE — ED Notes (Signed)
 Pt alert, eyes open.

## 2024-03-15 NOTE — ED Notes (Signed)
 EDP at bedside

## 2024-03-15 NOTE — Discharge Instructions (Signed)
 Return to the ER if you develop worsening symptoms or any other concerns.  Please call your cardiologist to make a follow-up appointment.

## 2024-03-15 NOTE — ED Provider Notes (Addendum)
 [facility] Provider Note    Event Date/Time   First MD Initiated Contact with Patient 03/15/24 [phone]     (approximate)   History   Palpitations   HPI  [name] [name] is a 63 y.o. male   ESRD status post kidney transplant, hypertension, and atrial fibrillation who presents with palpitations.  Patient does report being compliant with his Xarelto.  He denies ever missing a dose over the past year.  He does report taking metoprolol and his symptoms started at 1 AM and he took an additional dose of metoprolol and felt that it was just going to go away but unfortunately it has not which is what made him present to the ER.  Patient reports multiple cardioversions in the past and having ablations done previously.  He reports having IV AV nodal blockers previously that did not work     Physical Exam   Triage Vital Signs: ED Triage Vitals  Encounter Vitals Group     BP 03/15/24 0756 124/87     Systolic BP Percentile --      Diastolic BP Percentile --      Pulse Rate 03/15/24 0756 (!) 56     Resp 03/15/24 0756 16     Temp 03/15/24 0756 98.2 F (36.8 C)     Temp Source 03/15/24 0756 Oral     SpO2 03/15/24 0756 100 %     Weight --      Height --      Head Circumference --      Peak Flow --      Pain Score 03/15/24 0759 0     Pain Loc --      Pain Education --      Exclude from Growth Chart --     Most recent vital signs: Vitals:   03/15/24 0756  BP: 124/87  Pulse: (!) 56  Resp: 16  Temp: 98.2 F (36.8 C)  SpO2: 100%     General: Awake, no distress.  CV:  Good peripheral perfusion. Irregular tachy  Resp:  Normal effort.  Abd:  No distention. Soft and non tender  Other:  No swelling    ED Results / Procedures / Treatments   Labs (all labs ordered are listed, but only abnormal results are displayed) Labs Reviewed  BASIC METABOLIC PANEL WITH GFR  CBC  TROPONIN I (HIGH SENSITIVITY)     EKG  My interpretation of EKG:  atrial  fibrillation with a rate of 142 without any ST elevation or T wave inversions, intervals   Sinus bradycardia rate of 55 without any ST elevation or T wave inversions, normal intervals.  RADIOLOGY I have reviewed the xray personally and interpreted similar left lung chronic elevation   PROCEDURES:  Critical Care performed: Yes, see critical care procedure note(s)  .1-3 Lead EKG Interpretation  Performed by: Concha Se, MD Authorized by: Concha Se, MD     Interpretation: abnormal     ECG rate:  150   ECG rate assessment: tachycardic     Rhythm: atrial fibrillation     Ectopy: none     Conduction: normal   .Cardioversion  Date/Time: 03/15/2024 9:10 AM  Performed by: Concha Se, MD Authorized by: Concha Se, MD   Consent:    Consent obtained:  Written and verbal   Consent given by:  Patient   Risks discussed:  Cutaneous burn, death, pain and induced arrhythmia   Alternatives discussed:  No treatment  and rate-control medication Pre-procedure details:    Cardioversion basis:  Emergent   Rhythm:  Atrial fibrillation Patient sedated: Yes. Refer to sedation procedure documentation for details of sedation.  Attempt one:    Cardioversion mode:  Synchronous   Waveform:  Biphasic   Shock (Joules):  200   Shock outcome:  Conversion to normal sinus rhythm Post-procedure details:    Patient status:  Awake   Patient tolerance of procedure:  Tolerated well, no immediate complications .Sedation  Date/Time: 03/15/2024 9:10 AM  Performed by: Concha Se, MD Authorized by: Concha Se, MD   Consent:    Consent obtained:  Written and verbal   Consent given by:  Patient   Risks discussed:  Allergic reaction, dysrhythmia, inadequate sedation, nausea, vomiting, respiratory compromise necessitating ventilatory assistance and intubation, prolonged hypoxia resulting in organ damage and prolonged sedation necessitating reversal Universal protocol:    Immediately prior to  procedure, a time out was called: yes     Patient identity confirmed:  Arm band Indications:    Procedure performed:  Cardioversion Pre-sedation assessment:    Time since last food or drink:  12   ASA classification: class 2 - patient with mild systemic disease     Mouth opening:  3 or more finger widths   Mallampati score:  II - soft palate, uvula, fauces visible   Neck mobility: normal     Pre-sedation assessments completed and reviewed: pre-procedure mental status not reviewed and pre-procedure respiratory function not reviewed   A pre-sedation assessment was completed prior to the start of the procedure Procedure details (see MAR for exact dosages):    Sedation:  Etomidate   Intended level of sedation: deep   Intra-procedure events: hypercapnia and hypoxia     Intra-procedure management:  Airway repositioning and BVM ventilation   Total Provider sedation time (minutes):  15 Post-procedure details:   A post-sedation assessment was completed following the completion of the procedure.   Recovery: Patient returned to pre-procedure baseline     Patient is stable for discharge or admission: yes     Procedure completion:  Tolerated well, no immediate complications .Critical Care  Performed by: Concha Se, MD Authorized by: Concha Se, MD   Critical care provider statement:    Critical care time (minutes):  30   Critical care was necessary to treat or prevent imminent or life-threatening deterioration of the following conditions:  Cardiac failure   Critical care was time spent personally by me on the following activities:  Development of treatment plan with patient or surrogate, discussions with consultants, evaluation of patient's response to treatment, examination of patient, ordering and review of laboratory studies, ordering and review of radiographic studies, ordering and performing treatments and interventions, pulse oximetry, re-evaluation of patient's condition and review of  old charts    MEDICATIONS ORDERED IN ED: Medications  etomidate (AMIDATE) injection 12 mg (12 mg Intravenous Given 03/15/24 0900)     IMPRESSION / MDM / ASSESSMENT AND PLAN / ED COURSE  I reviewed the triage vital signs and the nursing notes.   Patient's presentation is most consistent with acute presentation with potential threat to life or bodily function.   Patient comes in with A-fib with RVR we discussed different methods including rate control versus cardioversion given on blood thinner.  Patient had cardioversions in the past and preferred to cardiovert.  Discussed case with Dr. Okey Dupre who is in agreement with cardioversion.  Will check labs, Electra abnormalities, AKI.  Slightly  elevated at 23 similar to his prior troponins.  His BMP shows stable creatinine at 1.78 with a normal potassium of 4.6.  His CBC is reassuring  Patient was cardioverted did-after cardioversion patient did have decreased respiratory rates with saturations going down to the upper 80s.  And so jaw thrust was done with bag-valve-mask for less than 1 minute with saturations improving  9:39 AM x-ray reads are not crossing over but I reviewed the portable x-ray without evidence of pneumothorax.  Has chronic elevation of the right hemidiaphragm with some atelectasis that looks similar to her prior x-ray.  Patient does not want to wait for the read to come back he reports feeling as baseline self and feels comfortable with discharge home without a radiology read.  9:53 AM reevaluated patient he reports feeling his baseline self he feels comfortable discharge home he does not want a repeat troponin given he is had resolution of symptoms and has had this A-fib with RVR multiple times I think this is reassuring given its elevation is most likely would just be from demand.  Mag was 1.9 we discussed a 2.0 goal.  Offered IV magnesium but patient preferred to go home and states that he has oral magnesium and he can take at  home  The patient is on the cardiac monitor to evaluate for evidence of arrhythmia and/or significant heart rate changes.      FINAL CLINICAL IMPRESSION(S) / ED DIAGNOSES   Final diagnoses:  Atrial fibrillation with rapid ventricular response (HCC)     Rx / DC Orders   ED Discharge Orders     None        Note:  This document was prepared using Dragon voice recognition software and may include unintentional dictation errors.   Lubertha Rush, MD 03/15/24 [phone]    Lubertha Rush, MD 03/15/24 [phone]

## 2024-03-23 ENCOUNTER — Ambulatory Visit (HOSPITAL_COMMUNITY): Admit: 2024-03-23 | Admitting: Orthopaedic Surgery

## 2024-03-23 ENCOUNTER — Encounter (HOSPITAL_COMMUNITY): Payer: Self-pay | Admitting: Physician Assistant

## 2024-03-23 SURGERY — ARTHROPLASTY, SHOULDER, TOTAL, REVERSE
Anesthesia: General | Site: Shoulder | Laterality: Left

## 2024-03-28 ENCOUNTER — Other Ambulatory Visit: Payer: Self-pay | Admitting: Cardiovascular Disease

## 2024-03-31 ENCOUNTER — Telehealth: Payer: Self-pay | Admitting: Cardiovascular Disease

## 2024-03-31 MED ORDER — METOPROLOL SUCCINATE ER 25 MG PO TB24: 25.0000 mg | ORAL_TABLET | Freq: Two times a day (BID) | ORAL | 2 refills | Status: DC

## 2024-03-31 NOTE — Telephone Encounter (Signed)
*  STAT* If patient is at the pharmacy, call can be transferred to refill team.   1. Which medications need to be refilled? (please list name of each medication and dose if known) metoprolol  succinate (TOPROL -XL) 25 MG 24 hr tablet    2. Would you like to learn more about the convenience, safety, & potential cost savings by using the [facility]? No    3. Are you open to using the [facility] (Type [facility].  ). No   4. Which pharmacy/location (including street and city if local pharmacy) is medication to be sent to? [facility] - [address], [facility] [address] Floor    5. Do they need a 30 day or 90 day supply? 30 day

## 2024-03-31 NOTE — Telephone Encounter (Signed)
 Pt's medication was sent to pt's pharmacy as requested. Confirmation received.

## 2024-04-11 NOTE — Progress Notes (Unsigned)
  Electrophysiology Office Note:   Date:  04/12/2024  ID:  JATHAN KOTARSKI, DOB Jun 23, 1961, MRN [id number]  Primary Cardiologist: Janelle Mediate, MD Electrophysiologist: Boyce Byes, MD      History of Present Illness:   [name] [name] is a 63 y.o. male with h/o Obesity, chronic diastolic CHF, h/o TIA, and persistent AF s/p prior ablation seen today for routine electrophysiology followup.   Pt seen in ED 03/15/2024 with AF RVR. Underwent DCC.   Since last being seen in our clinic the patient reports doing well overall. Needs shoulder surgery, but was delayed in setting of his recurrent AF. Otherwise, when not having AF he denies chest pain, palpitations, dyspnea, PND, orthopnea, nausea, vomiting, dizziness, syncope, edema, weight gain, or early satiety.   Review of systems complete and found to be negative unless listed in HPI.   EP Information / Studies Reviewed:    EKG is ordered today. Personal review as below.  EKG Interpretation Date/Time:  Tuesday Apr 12 2024 09:21:03 EDT Ventricular Rate:  52 PR Interval:  138 QRS Duration:  84 QT Interval:  454 QTC Calculation: 422 R Axis:   55  Text Interpretation: Sinus bradycardia When compared with ECG of 15-Mar-2024 09:07, PREVIOUS ECG IS PRESENT Confirmed by Pilar Bridge [phone]) on 04/12/2024 9:29:22 AM    Arrhythmia/Device History S/p AF ablation 08/2022   Physical Exam:   VS:  BP 108/80 (BP Location: Left Arm, Patient Position: Sitting, Cuff Size: Large)   Pulse (!) 52   Ht 5\' 10"  (1.778 m)   Wt 252 lb (114.3 kg)   BMI 36.16 kg/m    Wt Readings from Last 3 Encounters:  04/12/24 252 lb (114.3 kg)  03/10/24 257 lb 15 oz (117 kg)  02/18/24 258 lb 12.8 oz (117.4 kg)     GEN: No acute distress NECK: No JVD; No carotid bruits CARDIAC: Regular rate and rhythm, no murmurs, rubs, gallops RESPIRATORY:  Clear to auscultation without rales, wheezing or rhonchi  ABDOMEN: Soft, non-tender, non-distended EXTREMITIES:   No edema; No deformity   ASSESSMENT AND PLAN:    Atrial fibrillation EKG today shows sinus bradycardia Now with recurrence s/p Ablation 08/2022 On Xarelto  20 mg daily for CHA2DS2/VASc of at least 6 Amiodarone interacts with tacrolimus , and in discussion with pharm D would avoid.  Continue toprol  25 mg BID. May need to cut back.  Start flecainide 50 mg BID. EKG in 7-10 days.   Pt had mild non-obstructive CAD in 2019. Discussed with Dr. Marven Slimmer and will use low dose flecainide as a bridge to get him through his shoulder surgery, then bring back in to discuss re-do ablation with PFA.  Pt is also interested in discussing Watchman procedure, potentially as a concomitant procedure.   Cardiac Clearance  No concerns from a cardiac perspective.  The patient is at low risk to proceed without further work up.  Verbalizes understanding of increased stroke risk anytime he is off OAC.  If the patient has new chest pain or SOB prior to surgery, they should be revaluated. Ok to hold Eliquis  now that he is 4 weeks out from cardioversion.    Follow up with EP APP in 4 weeks  Signed, Tylene Galla, PA-C

## 2024-04-12 ENCOUNTER — Ambulatory Visit: Attending: Student | Admitting: Student

## 2024-04-12 ENCOUNTER — Ambulatory Visit: Payer: PPO | Admitting: Dermatology

## 2024-04-12 ENCOUNTER — Encounter: Payer: Self-pay | Admitting: Student

## 2024-04-12 VITALS — BP 108/80 | HR 52 | Ht 70.0 in | Wt 252.0 lb

## 2024-04-12 DIAGNOSIS — E782 Mixed hyperlipidemia: Secondary | ICD-10-CM | POA: Diagnosis not present

## 2024-04-12 DIAGNOSIS — I480 Paroxysmal atrial fibrillation: Secondary | ICD-10-CM | POA: Diagnosis not present

## 2024-04-12 DIAGNOSIS — I5032 Chronic diastolic (congestive) heart failure: Secondary | ICD-10-CM

## 2024-04-12 DIAGNOSIS — I5030 Unspecified diastolic (congestive) heart failure: Secondary | ICD-10-CM

## 2024-04-12 DIAGNOSIS — I10 Essential (primary) hypertension: Secondary | ICD-10-CM | POA: Diagnosis not present

## 2024-04-12 DIAGNOSIS — G459 Transient cerebral ischemic attack, unspecified: Secondary | ICD-10-CM

## 2024-04-12 MED ORDER — FLECAINIDE ACETATE 50 MG PO TABS: 50.0000 mg | ORAL_TABLET | Freq: Two times a day (BID) | ORAL | 3 refills | Status: AC

## 2024-04-12 NOTE — Patient Instructions (Signed)
 Medication Instructions:  Start flecainide 50 mg twice daily. *If you need a refill on your cardiac medications before your next appointment, please call your pharmacy*  Lab Work: None ordered If you have labs (blood work) drawn today and your tests are completely normal, you will receive your results only by: MyChart Message (if you have MyChart) OR A paper copy in the mail If you have any lab test that is abnormal or we need to change your treatment, we will call you to review the results.  Follow-Up: At [facility], you and your health needs are our priority.  As part of our continuing mission to provide you with exceptional heart care, our providers are all part of one team.  This team includes your primary Cardiologist (physician) and Advanced Practice Providers or APPs (Physician Assistants and Nurse Practitioners) who all work together to provide you with the care you need, when you need it.  Your next appointment:   1 month(s)  Provider:   Bambi Lever "Michaelle Adolphus, PA-C    Schedule nurse visit in 7-10 days for an EKG due to starting flecainide.  Schedule 4 month follow up with Dr Marven Slimmer to discuss watchman and ablation.

## 2024-04-14 ENCOUNTER — Encounter: Payer: Self-pay | Admitting: General Surgery

## 2024-04-15 ENCOUNTER — Encounter: Payer: Self-pay | Admitting: Pediatrics

## 2024-04-15 ENCOUNTER — Telehealth: Payer: Self-pay

## 2024-04-15 ENCOUNTER — Encounter: Payer: Self-pay | Admitting: Nephrology

## 2024-04-15 NOTE — Telephone Encounter (Signed)
   Pre-operative Risk Assessment    Patient Name: [name] [name]  DOB: 1961-08-14 MRN: [id number]   Date of last office visit: 04/12/24 with Michaelle Adolphus Date of next office visit: 05/23/24 with Michaelle Adolphus   Request for Surgical Clearance    Procedure:  Left Reverse Total Shoulder Arthroplasty  Date of Surgery:  Clearance TBD                                 Surgeon:  Dr. Grafton Lawrence Surgeon's Group or Practice Name:  [facility] Phone number:  [phone] ext. [phone] Mark Sil Fax number:  [phone]   Type of Clearance Requested:   - Medical  - Pharmacy:  Hold Rivaroxaban  (Xarelto ) not indicated   Type of Anesthesia:  General  with interscalene block   Additional requests/questions:    Signed, Sudie Ely   04/15/2024, 4:29 PM

## 2024-04-18 NOTE — Progress Notes (Signed)
 Given Afib- BNP ordered to [place] for CHF

## 2024-04-21 ENCOUNTER — Ambulatory Visit: Attending: Cardiology

## 2024-04-21 VITALS — HR 50 | Ht 70.0 in | Wt 252.0 lb

## 2024-04-21 DIAGNOSIS — I480 Paroxysmal atrial fibrillation: Secondary | ICD-10-CM

## 2024-04-21 DIAGNOSIS — I5032 Chronic diastolic (congestive) heart failure: Secondary | ICD-10-CM | POA: Diagnosis not present

## 2024-04-21 DIAGNOSIS — I4891 Unspecified atrial fibrillation: Secondary | ICD-10-CM

## 2024-04-21 DIAGNOSIS — I10 Essential (primary) hypertension: Secondary | ICD-10-CM

## 2024-04-21 NOTE — Patient Instructions (Signed)
 Medication Instructions:  Your physician recommends that you continue on your current medications as directed. Please refer to the Current Medication list given to you today.  *If you need a refill on your cardiac medications before your next appointment, please call your pharmacy*   Follow-Up: At [facility], you and your health needs are our priority.  As part of our continuing mission to provide you with exceptional heart care, our providers are all part of one team.  This team includes your primary Cardiologist (physician) and Advanced Practice Providers or APPs (Physician Assistants and Nurse Practitioners) who all work together to provide you with the care you need, when you need it.  Your next appointment:   Monday, June 23rd @ 8:40am   Provider:   Bambi [name] "Jonelle Neri" [place], [place]    We recommend signing up for the patient portal called "MyChart".  Sign up information is provided on this After Visit Summary.  MyChart is used to connect with patients for Virtual Visits (Telemedicine).  Patients are able to view lab/test results, encounter notes, upcoming appointments, etc.  Non-urgent messages can be sent to your provider as well.   To learn more about what you can do with MyChart, go to [email or web address].

## 2024-04-21 NOTE — Progress Notes (Unsigned)
   Nurse Visit   Date of Encounter: 04/21/2024 ID: [name] [name], DOB 23-Oct-1961, MRN [id number]  PCP:  Valli Gaw, MD   [facility] Providers Cardiologist:  Janelle Mediate, MD Electrophysiologist:  Boyce Byes, MD {    Visit Details   VS:  Pulse (!) 50   Ht 5\' 10"  (1.778 m)   Wt 252 lb (114.3 kg)   BMI 36.16 kg/m  , BMI Body mass index is 36.16 kg/m.  Wt Readings from Last 3 Encounters:  04/21/24 252 lb (114.3 kg)  04/12/24 252 lb (114.3 kg)  03/10/24 257 lb 15 oz (117 kg)     Reason for visit: EKG  Performed today: EKG and Provider consulted:Dr. Marven Slimmer Changes (medications, testing, etc.) : no changes at this time Length of Visit: 10 minutes    Medications Adjustments/Labs and Tests Ordered: Orders Placed This Encounter  Procedures   EKG 12-Lead   No orders of the defined types were placed in this encounter.    Signed, Ab Leaming L, RN  04/21/2024 11:40 AM

## 2024-04-22 NOTE — Telephone Encounter (Signed)
   Patient Name: [name] [name]  DOB: 1961/04/25 MRN: [id number]  Primary Cardiologist: Janelle Mediate, MD  Clinical pharmacists have reviewed the patient's past medical history, labs, and current medications as part of preoperative protocol coverage. The following recommendations have been made:  Per office protocol, patient can hold Xarelto  for 2 days prior to procedure   I will route this recommendation to the requesting party via Epic fax function and remove from pre-op  pool.  Please call with questions.  Francene Ing, Retha Cast, NP 04/22/2024, 5:10 PM

## 2024-04-22 NOTE — Telephone Encounter (Signed)
 Patient with diagnosis of afib on Xarelto  for anticoagulation.    Procedure: Left Reverse Total Shoulder Arthroplasty  Date of procedure: TBD   CHA2DS2-VASc Score = 6   This indicates a 9.7% annual risk of stroke. The patient's score is based upon: CHF History: 1 HTN History: 1 Diabetes History: 1 Stroke History: 2 Vascular Disease History: 0 Age Score: 1 Gender Score: 0      CrCl 53 ml/min Platelet count 158  Patient has not had an Afib/aflutter ablation within the last 3 months or DCCV within the last 30 days  Per office protocol, patient can hold Xarelto  for 2 days prior to procedure.    **This guidance is not considered finalized until pre-operative APP has relayed final recommendations.**

## 2024-05-03 ENCOUNTER — Ambulatory Visit: Admitting: Dermatology

## 2024-05-03 ENCOUNTER — Encounter: Payer: Self-pay | Admitting: Dermatology

## 2024-05-03 VITALS — BP 125/87 | HR 71

## 2024-05-03 DIAGNOSIS — L821 Other seborrheic keratosis: Secondary | ICD-10-CM | POA: Diagnosis not present

## 2024-05-03 DIAGNOSIS — Z1283 Encounter for screening for malignant neoplasm of skin: Secondary | ICD-10-CM

## 2024-05-03 DIAGNOSIS — D229 Melanocytic nevi, unspecified: Secondary | ICD-10-CM

## 2024-05-03 DIAGNOSIS — Z85828 Personal history of other malignant neoplasm of skin: Secondary | ICD-10-CM

## 2024-05-03 DIAGNOSIS — L814 Other melanin hyperpigmentation: Secondary | ICD-10-CM

## 2024-05-03 DIAGNOSIS — D1801 Hemangioma of skin and subcutaneous tissue: Secondary | ICD-10-CM

## 2024-05-03 DIAGNOSIS — W908XXA Exposure to other nonionizing radiation, initial encounter: Secondary | ICD-10-CM | POA: Diagnosis not present

## 2024-05-03 DIAGNOSIS — L570 Actinic keratosis: Secondary | ICD-10-CM | POA: Diagnosis not present

## 2024-05-03 DIAGNOSIS — L578 Other skin changes due to chronic exposure to nonionizing radiation: Secondary | ICD-10-CM | POA: Diagnosis not present

## 2024-05-03 DIAGNOSIS — D849 Immunodeficiency, unspecified: Secondary | ICD-10-CM

## 2024-05-03 DIAGNOSIS — Z940 Kidney transplant status: Secondary | ICD-10-CM

## 2024-05-03 DIAGNOSIS — C4492 Squamous cell carcinoma of skin, unspecified: Secondary | ICD-10-CM

## 2024-05-03 DIAGNOSIS — L905 Scar conditions and fibrosis of skin: Secondary | ICD-10-CM

## 2024-05-03 NOTE — Patient Instructions (Addendum)
 Cryotherapy Aftercare  Wash gently with soap and water  everyday.   Apply Vaseline and Band-Aid daily until healed.   Skin Education : We counseled the patient regarding the following: Sun screen (SPF 30 or greater) should be applied during peak UV exposure (between 10am and 2pm) and reapplied after exercise or swimming.  The ABCDEs of melanoma were reviewed with the patient, and the importance of monthly self-examination of moles was emphasized. Should any moles change in shape or color, or itch, bleed or burn, pt will contact our office for evaluation sooner then their interval appointment.  Plan: Sunscreen Recommendations We recommended a broad spectrum sunscreen with a SPF of 30 or higher. SPF 30 sunscreens block approximately 97 percent of the sun's harmful rays. Sunscreens should be applied at least 15 minutes prior to expected sun exposure and then every 2 hours after that as long as sun exposure continues. If swimming or exercising sunscreen should be reapplied every 45 minutes to an hour after getting wet or sweating. One ounce, or the equivalent of a shot glass full of sunscreen, is adequate to protect the skin not covered by a bathing suit. WE also recommended a lip balm with a sunscreen as well. Sun protective clothing can be used in lieu of sunscreen but must be worn the entire time you are exposed to the sun's rays.   Important Information   Due to recent changes in healthcare laws, you may see results of your pathology and/or laboratory studies on MyChart before the doctors have had a chance to review them. We understand that in some cases there may be results that are confusing or concerning to you. Please understand that not all results are received at the same time and often the doctors may need to interpret multiple results in order to provide you with the best plan of care or course of treatment. Therefore, we ask that you please give us  2 business days to thoroughly review all  your results before contacting the office for clarification. Should we see a critical lab result, you will be contacted sooner.     If You Need Anything After Your Visit   If you have any questions or concerns for your doctor, please call our main line at [phone]. If no one answers, please leave a voicemail as directed and we will return your call as soon as possible. Messages left after 4 pm will be answered the following business day.    You may also send us  a message via MyChart. We typically respond to MyChart messages within 1-2 business days.  For prescription refills, please ask your pharmacy to contact our office. Our fax number is [phone].  If you have an urgent issue when the clinic is closed that cannot wait until the next business day, you can page your doctor at the number below.     Please note that while we do our best to be available for urgent issues outside of office hours, we are not available 24/7.    If you have an urgent issue and are unable to reach us , you may choose to seek medical care at your doctor's office, retail clinic, urgent care center, or emergency room.   If you have a medical emergency, please immediately call 911 or go to the emergency department. In the event of inclement weather, please call our main line at [phone] for an update on the status of any delays or closures.  Dermatology Medication Tips: Please keep the boxes that topical  medications come in in order to help keep track of the instructions about where and how to use these. Pharmacies typically print the medication instructions only on the boxes and not directly on the medication tubes.   If your medication is too expensive, please contact our office at [phone] or send us  a message through MyChart.    We are unable to tell what your co-pay for medications will be in advance as this is different depending on your insurance coverage. However, we may be able to find a  substitute medication at lower cost or fill out paperwork to get insurance to cover a needed medication.    If a prior authorization is required to get your medication covered by your insurance company, please allow us  1-2 business days to complete this process.   Drug prices often vary depending on where the prescription is filled and some pharmacies may offer cheaper prices.   The website www.goodrx.com contains coupons for medications through different pharmacies. The prices here do not account for what the cost may be with help from insurance (it may be cheaper with your insurance), but the website can give you the price if you did not use any insurance.  - You can print the associated coupon and take it with your prescription to the pharmacy.  - You may also stop by our office during regular business hours and pick up a GoodRx coupon card.  - If you need your prescription sent electronically to a different pharmacy, notify our office through [facility] or by phone at [phone]

## 2024-05-03 NOTE — Progress Notes (Signed)
 Follow-Up Visit   Subjective  [name] [name] is a 63 y.o. male who presents for the following: Skin Cancer Screening and Full Body Skin Exam  The patient presents for Total-Body Skin Exam (TBSE) for skin cancer screening and mole check. The patient has spots, moles and lesions to be evaluated, some may be new or changing. Patient accompanied by wife, both are extremely pleasant. Recently recommended by other physician to be taking 500 mg BID of niacinamide to prevent NMSC.   The following portions of the chart were reviewed this encounter and updated as appropriate: medications, allergies, medical history  Review of Systems:  No other skin or systemic complaints except as noted in HPI or Assessment and Plan.  Objective  Well appearing patient in no apparent distress; mood and affect are within normal limits.  A full examination was performed including scalp, head, eyes, ears, nose, lips, neck, chest, axillae, abdomen, back, buttocks, bilateral upper extremities, bilateral lower extremities, hands, feet, fingers, toes, fingernails, and toenails. All findings within normal limits unless otherwise noted below.   Relevant physical exam findings are noted in the Assessment and Plan.  Chest - Medial (Center) x1, scalp x 2 (3) Erythematous thin papules/macules with gritty scale.   Assessment & Plan   SKIN CANCER SCREENING PERFORMED TODAY.  ACTINIC DAMAGE - Chronic condition, secondary to cumulative UV/sun exposure - diffuse scaly erythematous macules with underlying dyspigmentation - Recommend daily broad spectrum sunscreen SPF 30+ to sun-exposed areas, reapply every 2 hours as needed.  - Staying in the shade or wearing long sleeves, sun glasses (UVA+UVB protection) and wide brim hats (4-inch brim around the entire circumference of the hat) are also recommended for sun protection.  - Call for new or changing lesions.  MELANOCYTIC NEVI - Tan-brown and/or pink-flesh-colored symmetric  macules and papules - Benign appearing on exam today - Observation - Call clinic for new or changing moles - Recommend daily use of broad spectrum spf 30+ sunscreen to sun-exposed areas.   LENTIGINES Exam: scattered tan macules Due to sun exposure Treatment Plan: Benign-appearing, observe. Recommend daily broad spectrum sunscreen SPF 30+ to sun-exposed areas, reapply every 2 hours as needed.  Call for any changes   HEMANGIOMA Exam: red papule(s) Discussed benign nature. Recommend observation. Call for changes.   SEBORRHEIC KERATOSIS - Stuck-on, waxy, tan-brown papules and/or plaques  - Benign-appearing - Discussed benign etiology and prognosis. 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  - Call if any new or changing lesions are noted between office visits  History of Kidney Transplant- High Risk of Skin Cancer- 01/13/2019- Immunosuppressed 2/2 to Anti Rejection Meds - Discussed that risk of skin cancer, especially of SCC, due to anti rejection medications (tacrolimus , prednisone , mycophenolate ) - Discussed that patient should be seen every 6-9 months - Agree to niacinamide 500 mg BID   ACTINIC KERATOSIS Exam: Erythematous thin papules/macules with gritty scale  Actinic keratoses are precancerous spots that appear secondary to cumulative UV radiation exposure/sun exposure over time. They are chronic with expected duration over 1 year. A portion of actinic keratoses will progress to squamous cell carcinoma of the skin. It is not possible to reliably predict which spots will progress to skin cancer and so treatment  is recommended to prevent development of skin cancer.  Recommend daily broad spectrum sunscreen  SPF 30+ to sun-exposed areas, reapply every 2 hours as needed.  Recommend staying in the shade or wearing long sleeves, sun glasses (UVA+UVB protection) and wide brim hats (4-inch brim around the entire circumference of the hat). Call for new or changing lesions.  Treatment Plan:  Prior to procedure, discussed risks of blister formation, small wound, skin dyspigmentation, or rare scar following cryotherapy. Recommend Vaseline ointment to treated areas while healing.  Destruction Procedure Note Destruction method: cryotherapy   Informed consent: discussed and consent obtained   Lesion destroyed using liquid nitrogen: Yes   Outcome: patient tolerated procedure well with no complications   Post-procedure details: wound care instructions given   Locations: scalp, chest # of Lesions Treated: 3  AK (ACTINIC KERATOSIS) (3) Chest - Medial (Center) x1, scalp x 2 (3) Destruction of lesion - Chest - Medial (Center) x1, scalp x 2 (3) Complexity: simple   Destruction method: cryotherapy   Informed consent: discussed and consent obtained   Timeout:  patient name, date of birth, surgical site, and procedure verified Outcome: patient tolerated procedure well with no complications   Post-procedure details: wound care instructions given   Return in about 4 months (around 09/02/2024).  I, [name] [name], CMA, am acting as scribe for [name] Finlay, MD.   Documentation: I have reviewed the above documentation for accuracy and completeness, and I agree with the above.  [name] Finlay, MD

## 2024-05-04 ENCOUNTER — Other Ambulatory Visit: Payer: Self-pay | Admitting: Physician Assistant

## 2024-05-04 DIAGNOSIS — I4819 Other persistent atrial fibrillation: Secondary | ICD-10-CM

## 2024-05-04 NOTE — Telephone Encounter (Signed)
 Prescription refill request for Xarelto  received.  Indication:afib Last office visit:5/25 Weight:114.3  kg Age:63 Scr:1.75  5/25 CrCl:68.04  ml/min  Prescription refilled

## 2024-05-10 NOTE — Patient Instructions (Signed)
 SURGICAL WAITING ROOM VISITATION  Patients having surgery or a procedure may have no more than 2 support people in the waiting area - these visitors may rotate.    Children under the age of 58 must have an adult with them who is not the patient.  Visitors with respiratory illnesses are discouraged from visiting and should remain at home.  If the patient needs to stay at the hospital during part of their recovery, the visitor guidelines for inpatient rooms apply. Pre-op  nurse will coordinate an appropriate time for 1 support person to accompany patient in pre-op .  This support person may not rotate.    Please refer to the [facility] website for the visitor guidelines for Inpatients (after your surgery is over and you are in a regular room).    Your procedure is scheduled on: 05/25/24   Report to [facility] Main Entrance    Report to admitting at 6:00 AM   Call this number if you have problems the morning of surgery [phone]   Do not eat food or drink liquids :After Midnight.          If you have questions, please contact your surgeon's office.   FOLLOW BOWEL PREP AND ANY ADDITIONAL PRE OP INSTRUCTIONS YOU RECEIVED FROM YOUR SURGEON'S OFFICE!!!     Oral Hygiene is also important to reduce your risk of infection.                                    Remember - BRUSH YOUR TEETH THE MORNING OF SURGERY WITH YOUR REGULAR TOOTHPASTE  DENTURES WILL BE REMOVED PRIOR TO SURGERY PLEASE DO NOT APPLY Poly grip OR ADHESIVES!!!   Stop all vitamins and herbal supplements 7 days before surgery.   Take these medicines the morning of surgery with A SIP OF WATER : Atorvastatin , Flecainide , Metoprolol , Omeprazole, Prednisone    How to Manage Your Diabetes Before and After Surgery  Why is it important to control my blood sugar before and after surgery? Improving blood sugar levels before and after surgery helps healing and can limit problems. A way of improving blood sugar control is  eating a healthy diet by:  Eating less sugar and carbohydrates  Increasing activity/exercise  Talking with your doctor about reaching your blood sugar goals High blood sugars (greater than 180 mg/dL) can raise your risk of infections and slow your recovery, so you will need to focus on controlling your diabetes during the weeks before surgery. Make sure that the doctor who takes care of your diabetes knows about your planned surgery including the date and location.  How do I manage my blood sugar before surgery? Check your blood sugar at least 4 times a day, starting 2 days before surgery, to make sure that the level is not too high or low. Check your blood sugar the morning of your surgery when you wake up and every 2 hours until you get to the Short Stay unit. If your blood sugar is less than 70 mg/dL, you will need to treat for low blood sugar: Do not take insulin. Treat a low blood sugar (less than 70 mg/dL) with  cup of clear juice (cranberry or apple), 4 glucose tablets, OR glucose gel. Recheck blood sugar in 15 minutes after treatment (to make sure it is greater than 70 mg/dL). If your blood sugar is not greater than 70 mg/dL on recheck, call [phone] for further instructions. Report  your blood sugar to the short stay nurse when you get to Short Stay.  If you are admitted to the hospital after surgery: Your blood sugar will be checked by the staff and you will probably be given insulin after surgery (instead of oral diabetes medicines) to make sure you have good blood sugar levels. The goal for blood sugar control after surgery is 80-180 mg/dL.  Reviewed and Endorsed by [facility] Patient Education Committee, August 2015             You may not have any metal on your body including jewelry, and body piercing             Do not wear lotions, powders, cologne, or deodorant              Men may shave face and neck.   Do not bring valuables to the hospital. [facility] IS NOT              RESPONSIBLE   FOR VALUABLES.   Contacts, glasses, dentures or bridgework may not be worn into surgery.  DO NOT BRING YOUR HOME MEDICATIONS TO THE HOSPITAL. PHARMACY WILL DISPENSE MEDICATIONS LISTED ON YOUR MEDICATION LIST TO YOU DURING YOUR ADMISSION IN THE HOSPITAL!    Patients discharged on the day of surgery will not be allowed to drive home.  Someone NEEDS to stay with you for the first 24 hours after anesthesia.   Special Instructions: Bring a copy of your healthcare power of attorney and living will documents the day of surgery if you haven't scanned them before.              Please read over the following fact sheets you were given: IF YOU HAVE QUESTIONS ABOUT YOUR PRE-OP  INSTRUCTIONS PLEASE CALL [phone]- Kayleen Party    If you received a COVID test during your pre-op  visit  it is requested that you wear a mask when out in public, stay away from anyone that may not be feeling well and notify your surgeon if you develop symptoms. If you test positive for Covid or have been in contact with anyone that has tested positive in the last 10 days please notify you surgeon.      Pre-operative 5 CHG Bath Instructions   You can play a key role in reducing the risk of infection after surgery. Your skin needs to be as free of germs as possible. You can reduce the number of germs on your skin by washing with CHG (chlorhexidine  gluconate) soap before surgery. CHG is an antiseptic soap that kills germs and continues to kill germs even after washing.   DO NOT use if you have an allergy to chlorhexidine /CHG or antibacterial soaps. If your skin becomes reddened or irritated, stop using the CHG and notify one of our RNs at [phone].   Please shower with the CHG soap starting 4 days before surgery using the following schedule:     Please keep in mind the following:  DO NOT shave, including legs and underarms, starting the day of your first shower.   You may shave your face at any point  before/day of surgery.  Place clean sheets on your bed the day you start using CHG soap. Use a clean washcloth (not used since being washed) for each shower. DO NOT sleep with pets once you start using the CHG.   CHG Shower Instructions:  If you choose to wash your hair and private area, wash first with your normal shampoo/soap.  After you use shampoo/soap, rinse your hair and body thoroughly to remove shampoo/soap residue.  Turn the water  OFF and apply about 3 tablespoons (45 ml) of CHG soap to a CLEAN washcloth.  Apply CHG soap ONLY FROM YOUR NECK DOWN TO YOUR TOES (washing for 3-5 minutes)  DO NOT use CHG soap on face, private areas, open wounds, or sores.  Pay special attention to the area where your surgery is being performed.  If you are having back surgery, having someone wash your back for you may be helpful. Wait 2 minutes after CHG soap is applied, then you may rinse off the CHG soap.  Pat dry with a clean towel  Put on clean clothes/pajamas   If you choose to wear lotion, please use ONLY the CHG-compatible lotions on the back of this paper.     Additional instructions for the day of surgery: DO NOT APPLY any lotions, deodorants, cologne, or perfumes.   Put on clean/comfortable clothes.  Brush your teeth.  Ask your nurse before applying any prescription medications to the skin.      CHG Compatible Lotions   Aveeno Moisturizing lotion  Cetaphil Moisturizing Cream  Cetaphil Moisturizing Lotion  Clairol Herbal Essence Moisturizing Lotion, Dry Skin  Clairol Herbal Essence Moisturizing Lotion, Extra Dry Skin  Clairol Herbal Essence Moisturizing Lotion, Normal Skin  Curel Age Defying Therapeutic Moisturizing Lotion with Alpha Hydroxy  Curel Extreme Care Body Lotion  Curel Soothing Hands Moisturizing Hand Lotion  Curel Therapeutic Moisturizing Cream, Fragrance-Free  Curel Therapeutic Moisturizing Lotion, Fragrance-Free  Curel Therapeutic Moisturizing Lotion, Original  Formula  Eucerin Daily Replenishing Lotion  Eucerin Dry Skin Therapy Plus Alpha Hydroxy Crme  Eucerin Dry Skin Therapy Plus Alpha Hydroxy Lotion  Eucerin Original Crme  Eucerin Original Lotion  Eucerin Plus Crme Eucerin Plus Lotion  Eucerin TriLipid Replenishing Lotion  Keri Anti-Bacterial Hand Lotion  Keri Deep Conditioning Original Lotion Dry Skin Formula Softly Scented  Keri Deep Conditioning Original Lotion, Fragrance Free Sensitive Skin Formula  Keri Lotion Fast Absorbing Fragrance Free Sensitive Skin Formula  Keri Lotion Fast Absorbing Softly Scented Dry Skin Formula  Keri Original Lotion  Keri Skin Renewal Lotion Keri Silky Smooth Lotion  Keri Silky Smooth Sensitive Skin Lotion  Nivea Body Creamy Conditioning Oil  Nivea Body Extra Enriched Lotion  Nivea Body Original Lotion  Nivea Body Sheer Moisturizing Lotion Nivea Crme  Nivea Skin Firming Lotion  NutraDerm 30 Skin Lotion  NutraDerm Skin Lotion  NutraDerm Therapeutic Skin Cream  NutraDerm Therapeutic Skin Lotion  ProShield Protective Hand Cream  Provon moisturizing lotion   Incentive Spirometer  An incentive spirometer is a tool that can help keep your lungs clear and active. This tool measures how well you are filling your lungs with each breath. Taking long deep breaths may help reverse or decrease the chance of developing breathing (pulmonary) problems (especially infection) following: A long period of time when you are unable to move or be active. BEFORE THE PROCEDURE  If the spirometer includes an indicator to show your best effort, your nurse or respiratory therapist will set it to a desired goal. If possible, sit up straight or lean slightly forward. Try not to slouch. Hold the incentive spirometer in an upright position. INSTRUCTIONS FOR USE  Sit on the edge of your bed if possible, or sit up as far as you can in bed or on a chair. Hold the incentive spirometer in an upright position. Breathe out  normally. Place the mouthpiece in your mouth and  seal your lips tightly around it. Breathe in slowly and as deeply as possible, raising the piston or the ball toward the top of the column. Hold your breath for 3-5 seconds or for as long as possible. Allow the piston or ball to fall to the bottom of the column. Remove the mouthpiece from your mouth and breathe out normally. Rest for a few seconds and repeat Steps 1 through 7 at least 10 times every 1-2 hours when you are awake. Take your time and take a few normal breaths between deep breaths. The spirometer may include an indicator to show your best effort. Use the indicator as a goal to work toward during each repetition. After each set of 10 deep breaths, practice coughing to be sure your lungs are clear. If you have an incision (the cut made at the time of surgery), support your incision when coughing by placing a pillow or rolled up towels firmly against it. Once you are able to get out of bed, walk around indoors and cough well. You may stop using the incentive spirometer when instructed by your caregiver.  RISKS AND COMPLICATIONS Take your time so you do not get dizzy or light-headed. If you are in pain, you may need to take or ask for pain medication before doing incentive spirometry. It is harder to take a deep breath if you are having pain. AFTER USE Rest and breathe slowly and easily. It can be helpful to keep track of a log of your progress. Your caregiver can provide you with a simple table to help with this. If you are using the spirometer at home, follow these instructions: SEEK MEDICAL CARE IF:  You are having difficultly using the spirometer. You have trouble using the spirometer as often as instructed. Your pain medication is not giving enough relief while using the spirometer. You develop fever of 100.5 F (38.1 C) or higher. SEEK IMMEDIATE MEDICAL CARE IF:  You cough up bloody sputum that had not been present before. You  develop fever of 102 F (38.9 C) or greater. You develop worsening pain at or near the incision site. MAKE SURE YOU:  Understand these instructions. Will watch your condition. Will get help right away if you are not doing well or get worse. Document Released: 03/30/2007 Document Revised: 02/09/2012 Document Reviewed: 05/31/2007 ExitCare Patient Information 2014 [employer].   ________________________________________________________________________  [facility]- Preparing for Total Shoulder Arthroplasty    Before surgery, you can play an important role. Because skin is not sterile, your skin needs to be as free of germs as possible. You can reduce the number of germs on your skin by using the following products. Benzoyl Peroxide Gel Reduces the number of germs present on the skin Applied twice a day to shoulder area starting two days before surgery    ==================================================================  Please follow these instructions carefully:  BENZOYL PEROXIDE 5% GEL  Please do not use if you have an allergy to benzoyl peroxide.   If your skin becomes reddened/irritated stop using the benzoyl peroxide.  Starting two days before surgery, apply as follows: Apply benzoyl peroxide in the morning and at night. Apply after taking a shower. If you are not taking a shower clean entire shoulder front, back, and side along with the armpit with a clean wet washcloth.  Place a quarter-sized dollop on your shoulder and rub in thoroughly, making sure to cover the front, back, and side of your shoulder, along with the armpit.   2 days before  ____ AM   ____ PM              1 day before ____ AM   ____ PM                         Do this twice a day for two days.  (Last application is the night before surgery, AFTER using the CHG soap as described below).  Do NOT apply benzoyl peroxide gel on the day of surgery.

## 2024-05-10 NOTE — Progress Notes (Signed)
 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 Okay to do BP and blood work but patient prefers LUE  COVID Vaccine Completed: yes  Date of COVID positive in last 90 days: no  PCP - Valli Gaw, MD Cardiologist - Janelle Mediate, MD Electrophysiologist- Jacolyn Matar, MD (has not seen in years) Nephrology- Alejos Amsterdam, MD  Clearances in media tab dated 05/02/24  Cardiac clearance by Pilar Bridge, PA 04/12/24 in Epic  Chest x-ray - 03/15/24 Epic EKG - 04/12/24 Epic brady at 50 Stress Test - 02/06/20 [facility] ECHO - 09/14/23 Epic Heart monitor- 02/18/24-3/31-25 Epic Cardiac Cath - 02/24/23 Epic Pacemaker/ICD device last checked: n/a Spinal Cord Stimulator: n/a  Bowel Prep - no  Sleep Study - n/a CPAP -   Fasting Blood Sugar - pre DM, no checks at home Checks Blood Sugar _____ times a day  Last dose of GLP1 agonist-  N/A GLP1 instructions:  Hold 7 days before surgery    Last dose of SGLT-2 inhibitors-  N/A SGLT-2 instructions:  Hold 3 days before surgery    Blood Thinner Instructions: Xarelto , hold 2 days per cards Aspirin  Instructions: Last Dose:  05/22/24 1800  Activity level: Can go up a flight of stairs and perform activities of daily living without stopping and without symptoms of chest pain. SOB patient reports nothing new, has hx of hemidiaphragm  Anesthesia review: CHF, HTN, a fib, hemidiaphragm, DM2, CKD, TIA, post living-donor kidney transplant   Patient denies shortness of breath, fever, cough and chest pain at PAT appointment  Patient verbalized understanding of instructions that were given to them at the PAT appointment. Patient was also instructed that they will need to review over the PAT instructions again at home before surgery.

## 2024-05-12 ENCOUNTER — Encounter (HOSPITAL_COMMUNITY)
Admission: RE | Admit: 2024-05-12 | Discharge: 2024-05-12 | Disposition: A | Discharge status: Home / Self Care | Source: Ambulatory Visit | Attending: Orthopaedic Surgery | Admitting: Orthopaedic Surgery

## 2024-05-12 ENCOUNTER — Other Ambulatory Visit: Payer: Self-pay

## 2024-05-12 ENCOUNTER — Encounter (HOSPITAL_COMMUNITY): Payer: Self-pay

## 2024-05-12 VITALS — BP 146/100 | HR 50 | Temp 98.6°F | Resp 12 | Ht 70.0 in | Wt 255.0 lb

## 2024-05-12 DIAGNOSIS — Z01812 Encounter for preprocedural laboratory examination: Secondary | ICD-10-CM | POA: Diagnosis not present

## 2024-05-12 DIAGNOSIS — Z01818 Encounter for other preprocedural examination: Secondary | ICD-10-CM

## 2024-05-12 DIAGNOSIS — E1169 Type 2 diabetes mellitus with other specified complication: Secondary | ICD-10-CM | POA: Insufficient documentation

## 2024-05-12 LAB — CBC
HCT: 44.2 % (ref 39.0–52.0)
Hemoglobin: 14.2 g/dL (ref 13.0–17.0)
MCH: 31.2 pg (ref 26.0–34.0)
MCHC: 32.1 g/dL (ref 30.0–36.0)
MCV: 97.1 fL (ref 80.0–100.0)
Platelets: 137 10*3/uL — ABNORMAL LOW (ref 150–400)
RBC: 4.55 MIL/uL (ref 4.22–5.81)
RDW: 13.3 % (ref 11.5–15.5)
WBC: 5.6 10*3/uL (ref 4.0–10.5)
nRBC: 0 % (ref 0.0–0.2)

## 2024-05-12 LAB — BASIC METABOLIC PANEL WITH GFR
Anion gap: 13 (ref 5–15)
BUN: 20 mg/dL (ref 8–23)
CO2: 24 mmol/L (ref 22–32)
Calcium: 9.6 mg/dL (ref 8.9–10.3)
Chloride: 102 mmol/L (ref 98–111)
Creatinine, Ser: 1.59 mg/dL — ABNORMAL HIGH (ref 0.61–1.24)
GFR, Estimated: 48 mL/min — ABNORMAL LOW (ref >60)
Glucose, Bld: 124 mg/dL — ABNORMAL HIGH (ref 70–99)
Potassium: 4 mmol/L (ref 3.5–5.1)
Sodium: 139 mmol/L (ref 135–145)

## 2024-05-12 LAB — SURGICAL PCR SCREEN
MRSA, PCR: NEGATIVE
Staphylococcus aureus: NEGATIVE

## 2024-05-13 LAB — HEMOGLOBIN A1C
Hgb A1c MFr Bld: 6.1 % — ABNORMAL HIGH (ref 4.8–5.6)
Mean Plasma Glucose: 128 mg/dL

## 2024-05-17 NOTE — Progress Notes (Addendum)
 Anesthesia Chart Review   Case: [id number] Date/Time: 05/25/24 0815   Procedure: ARTHROPLASTY, SHOULDER, TOTAL, REVERSE (Left: Shoulder)   Anesthesia type: General   Diagnosis: Primary osteoarthritis of left shoulder [M19.012]   Pre-op  diagnosis: osteoarthritis of left shoulder   Location: [facility]OM 09 / [facility]   Surgeons: Cristy Bonner DASEN, MD       DISCUSSION:63 y.o. never smoker with h/o HTN, right hemidiaphragmatic paralysis, atrial fibrillation, chronic diastolic heart failure, mild non-obstructive CAD, AAA, s/p lap band surger, s/p kidney transplant 2020, CKD Stage III, left shoulder OA scheduled for above procedure 05/25/2024 with Dr. Bonner Cristy.   Pt seen in ED 03/15/2024 with AF RVR, underwent DCC.  Last seen by cardiology 04/12/2024. Per OV note, No concerns from a cardiac perspective.  The patient is at low risk to proceed without further work up.  Verbalizes understanding of increased stroke risk anytime he is off OAC.  If the patient has new chest pain or SOB prior to surgery, they should be revaluated. Ok to hold Eliquis  now that he is 4 weeks out from cardioversion.  Follow up with cardiology 05/23/2024. EKG NSR. Plans to discuss repeat ablation after shoulder surgery.    Last seen by nephrology 04/13/2024. Per OV note pt remains on envarsus  5 mg (target 5-6); Myfortic  540/540; pred 5 daily.  Stable at this visit, no changes made, 4 month follow up recommended.   VS: BP (!) 146/100   Pulse (!) 50   Temp 37 C (Oral)   Resp 12   Ht 5' 10 (1.778 m)   Wt 115.7 kg   SpO2 96%   BMI 36.59 kg/m   PROVIDERS: Hope Merle, MD is PCP   Primary Cardiologist: Maude Emmer, MD  LABS: Labs reviewed: Acceptable for surgery. (all labs ordered are listed, but only abnormal results are displayed)  Labs Reviewed  HEMOGLOBIN A1C - Abnormal; Notable for the following components:      Result Value   Hgb A1c MFr Bld 6.1 (*)    All other components within normal limits  BASIC METABOLIC  PANEL WITH GFR - Abnormal; Notable for the following components:   Glucose, Bld 124 (*)    Creatinine, Ser 1.59 (*)    GFR, Estimated 48 (*)    All other components within normal limits  CBC - Abnormal; Notable for the following components:   Platelets 137 (*)    All other components within normal limits  SURGICAL PCR SCREEN     IMAGES:   EKG:   CV: Echo 09/14/2023 1. Left ventricular ejection fraction, by estimation, is 55 to 60%. The  left ventricle has normal function. The left ventricle has no regional  wall motion abnormalities. There is mild asymmetric left ventricular  hypertrophy of the septal segment. Left  ventricular diastolic parameters were normal.   2. Right ventricular systolic function is normal. The right ventricular  size is normal. There is normal pulmonary artery systolic pressure. The  estimated right ventricular systolic pressure is 28.4 mmHg.   3. The mitral valve is abnormal. Trivial mitral valve regurgitation.   4. The aortic valve is tricuspid. Aortic valve regurgitation is trivial.   5. Aortic dilatation noted. There is borderline dilatation of the aortic  root, measuring 39 mm. There is mild dilatation of the ascending aorta,  measuring 40 mm.   6. The inferior vena cava is normal in size with greater than 50%  respiratory variability, suggesting right atrial pressure of 3 mmHg.   Past Medical  History:  Diagnosis Date   Abnormal stress test 05/07/2018   Actinic keratoses 05/01/2020   Arthritis    Ascending aorta dilatation (HCC)    46 mm ascending thoracic aorta aneurysm by 02/26/23 TTE   Atrial fibrillation (HCC) 11/28/2016   Cataract    bilateral- right removed, left present   Cervical radiculopathy 03/01/2020   Cherry angioma 05/01/2020   Chest pain syndrome 01/26/2020   Chronic diastolic CHF (congestive heart failure) (HCC)    Complication of anesthesia    problem with bladder not waking up after surgery-for right nephrectomy    Dermatofibroma 05/01/2020   Dry skin 05/01/2020   Dyspnea    Dysrhythmia    Elevated hemidiaphragm 02/16/2023   right   Encounter for adequacy testing for dialysis (HCC) 09/18/2014   Enlarged kidney    right   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   Folliculitis 05/01/2020   GERD without esophagitis 12/05/2020   Gout    HLD (hyperlipidemia) 11/28/2016   Hypertension    Hypokalemia 11/28/2016   Hypomagnesemia 01/30/2020   Immunosuppressive management encounter following kidney transplant 01/25/2019   Lapband APS July 2015 06/20/2014   Left shoulder pain 06/14/2020   Mixed hyperlipidemia 11/13/2014   Morbid obesity BMI 42 01/04/2014   Multiple benign nevi 05/01/2020   Pre-diabetes    S/P repair of ventral hernia 06/21/2014   Sebaceous hyperplasia 05/01/2020   Seborrheic keratoses 05/01/2020   Serum total bilirubin elevated 01/30/2020   Squamous cell carcinoma of skin    Status post gastric banding 06/20/2014   Status post laparoscopic cholecystectomy August 2016 07/13/2015   Status post living-donor kidney transplantation 01/17/2019   Sun-damaged skin 05/01/2020   TIA (transient ischemic attack) 07/14/2017   Vertigo     Past Surgical History:  Procedure Laterality Date   ATRIAL FIBRILLATION ABLATION N/A 09/25/2022   Procedure: ATRIAL FIBRILLATION ABLATION;  Surgeon: Cindie Ole DASEN, MD;  [facility]: [facility];  Service: Cardiovascular;  Laterality: N/A;   AV FISTULA PLACEMENT     IT CLOSED UP AND DOES NOT WORK   AV FISTULA PLACEMENT Right 10/27/2014   Procedure: ARTERIOVENOUS (AV) FISTULA CREATION;  Surgeon: Redell LITTIE Door, MD;  Location: [facility] OR;  Service: Vascular;  Laterality: Right;   AV FISTULA PLACEMENT Right 08/22/2015   Procedure: BRACHIOCEPHALIC ARTERIOVENOUS (AV) FISTULA CREATION;  Surgeon: Redell LITTIE Door, MD;  Location: [facility];  Service: Vascular;  Laterality: Right;   CATARACTS REMOVED Right    CHOLECYSTECTOMY N/A 07/13/2015   Procedure: LAPAROSCOPIC  CHOLECYSTECTOMY WITH INTRAOPERATIVE CHOLANGIOGRAM;  Surgeon: Donnice Lunger, MD;  [facility];  Service: General;  Laterality: N/A;   EYE SURGERY     left cataract surgery-lens implant   HERNIA REPAIR     INSERTION OF MESH N/A 06/20/2014   Procedure: INSERTION OF MESH;  Surgeon: Donnice KATHEE Lunger, MD;  Location[facility];  Service: General;  Laterality: N/A;   KNEE SURGERY  2001   LAPAROSCOPIC GASTRIC BANDING N/A 06/20/2014   Procedure: LAPAROSCOPIC GASTRIC BANDING and VENTRAL HERNIA REPAIR WITH MESH;  Surgeon: Donnice KATHEE Lunger, MD;  [facility];  Service: General;  Laterality: N/A;   PATCH ANGIOPLASTY Right 06/23/2023   Procedure: PATCH ANGIOPLASTY USING 1CM X 6CM XENOSURE BIOLOGIC PATCH;  Surgeon: Eliza Lonni RAMAN, MD;  Location: [facility] OR;  Service: Vascular;  Laterality: Right;   REVISON OF ARTERIOVENOUS FISTULA Right 03/31/2023   Procedure: RIGHT ARM ARTERIOVENOUS FISTULA BANDING;  Surgeon: Sheree Penne Lonni, MD;  Location: [facility] OR;  Service:  Vascular;  Laterality: Right;   REVISON OF ARTERIOVENOUS FISTULA Right 06/23/2023   Procedure: EXCISION OF PSEUDOANEURYSM RIGHT ARTERIOVENOUS FISTULA;  Surgeon: Eliza Lonni RAMAN, MD;  Location: [facility] OR;  Service: Vascular;  Laterality: Right;   RIGHT HEART CATH N/A 02/24/2023   Procedure: RIGHT HEART CATH;  Surgeon: Gardenia Led, DO;  [facility];  Service: Cardiovascular;  Laterality: N/A;   TEE WITHOUT CARDIOVERSION N/A 09/25/2022   Procedure: TRANSESOPHAGEAL ECHOCARDIOGRAM (TEE);  Surgeon: Cindie Ole DASEN, MD;  Location: [facility] INVASIVE CV LAB;  Service: Cardiovascular;  Laterality: N/A;   TOTAL NEPHRECTOMY Right     MEDICATIONS:  atorvastatin  (LIPITOR) 40 MG tablet   diphenhydramine -acetaminophen  (TYLENOL  PM) 25-500 MG TABS tablet   ENVARSUS  XR 1 MG TB24   flecainide  (TAMBOCOR ) 50 MG tablet   furosemide  (LASIX ) 40 MG tablet   losartan (COZAAR) 25 MG tablet   magnesium  oxide (MAG-OX) 400 MG tablet    metoprolol  succinate (TOPROL -XL) 25 MG 24 hr tablet   mycophenolate  (MYFORTIC ) 180 MG EC tablet   niacinamide 500 MG tablet   omeprazole (PRILOSEC) 20 MG capsule   predniSONE  (DELTASONE ) 5 MG tablet   sulfamethoxazole -trimethoprim  (BACTRIM ) 400-80 MG tablet   XARELTO  20 MG TABS tablet    sodium chloride  flush (NS) 0.9 % injection 3 mL    [facility], PA-C [facility]Surgical Testing [phone]

## 2024-05-19 NOTE — H&P (Signed)
 PREOPERATIVE H&P  Chief Complaint: osteoarthritis of left shoulder  HPI: [name] [name] is a 63 y.o. male who is scheduled for Procedure(s): ARTHROPLASTY, SHOULDER, TOTAL, REVERSE.   Patient has a past medical history significant for  HTN, right hemidiaphragmatic paralysis, atrial fibrillation, chronic diastolic heart failure, mild non-obstructive CAD, AAA, s/p lap band surger, s/p kidney transplant 2020, CKD Stage II .   He is a 63 year old who has had many years of left shoulder pain. He has  had worsening symptoms over time. He has tried and failed non-operative measures including injections and physical therapy at this point for more than six weeks.    Symptoms are rated as moderate to severe, and have been worsening.  This is significantly impairing activities of daily living.    Please see clinic note for further details on this patient's care.    He has elected for surgical management.   Past Medical History:  Diagnosis Date   Abnormal stress test 05/07/2018   Actinic keratoses 05/01/2020   Arthritis    Ascending aorta dilatation (HCC)    46 mm ascending thoracic aorta aneurysm by 02/26/23 TTE   Atrial fibrillation (HCC) 11/28/2016   Cataract    bilateral- right removed, left present   Cervical radiculopathy 03/01/2020   Cherry angioma 05/01/2020   Chest pain syndrome 01/26/2020   Chronic diastolic CHF (congestive heart failure) (HCC)    Complication of anesthesia    problem with bladder not waking up after surgery-for right nephrectomy   Dermatofibroma 05/01/2020   Dry skin 05/01/2020   Dyspnea    Dysrhythmia    Elevated hemidiaphragm 02/16/2023   right   Encounter for adequacy testing for dialysis (HCC) 09/18/2014   Enlarged kidney    right   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   Folliculitis 05/01/2020   GERD without esophagitis 12/05/2020   Gout    HLD (hyperlipidemia) 11/28/2016   Hypertension    Hypokalemia 11/28/2016   Hypomagnesemia 01/30/2020    Immunosuppressive management encounter following kidney transplant 01/25/2019   Lapband APS July 2015 06/20/2014   Left shoulder pain 06/14/2020   Mixed hyperlipidemia 11/13/2014   Morbid obesity BMI 42 01/04/2014   Multiple benign nevi 05/01/2020   Pre-diabetes    S/P repair of ventral hernia 06/21/2014   Sebaceous hyperplasia 05/01/2020   Seborrheic keratoses 05/01/2020   Serum total bilirubin elevated 01/30/2020   Squamous cell carcinoma of skin    Status post gastric banding 06/20/2014   Status post laparoscopic cholecystectomy August 2016 07/13/2015   Status post living-donor kidney transplantation 01/17/2019   Sun-damaged skin 05/01/2020   TIA (transient ischemic attack) 07/14/2017   Vertigo    Past Surgical History:  Procedure Laterality Date   ATRIAL FIBRILLATION ABLATION N/A 09/25/2022   Procedure: ATRIAL FIBRILLATION ABLATION;  Surgeon: Boyce Byes, MD;  [facility];  Service: Cardiovascular;  Laterality: N/A;   AV FISTULA PLACEMENT     IT CLOSED UP AND DOES NOT WORK   AV FISTULA PLACEMENT Right 10/27/2014   Procedure: ARTERIOVENOUS (AV) FISTULA CREATION;  Surgeon: Arvil Lauber, MD;  Location: [facility] OR;  Service: Vascular;  Laterality: Right;   AV FISTULA PLACEMENT Right 08/22/2015   Procedure: BRACHIOCEPHALIC ARTERIOVENOUS (AV) FISTULA CREATION;  Surgeon: Arvil Lauber, MD;  Location[facility];  Service: Vascular;  Laterality: Right;   CATARACTS REMOVED Right    CHOLECYSTECTOMY N/A 07/13/2015   Procedure: LAPAROSCOPIC CHOLECYSTECTOMY WITH INTRAOPERATIVE CHOLANGIOGRAM;  Surgeon: Jacolyn Matar, MD;  [facility];  Service: General;  Laterality: N/A;   EYE SURGERY     left cataract surgery-lens implant   HERNIA REPAIR     INSERTION OF MESH N/A 06/20/2014   Procedure: INSERTION OF MESH;  Surgeon: Azucena Bollard, MD;  [facility];  Service: General;  Laterality: N/A;   KNEE SURGERY  2001   LAPAROSCOPIC GASTRIC BANDING N/A 06/20/2014    Procedure: LAPAROSCOPIC GASTRIC BANDING and VENTRAL HERNIA REPAIR WITH MESH;  Surgeon: Azucena Bollard, MD;  [facility];  Service: General;  Laterality: N/A;   PATCH ANGIOPLASTY Right 06/23/2023   Procedure: PATCH ANGIOPLASTY USING 1CM X 6CM XENOSURE BIOLOGIC PATCH;  Surgeon: Dannis Dy, MD;  Location: [facility] OR;  Service: Vascular;  Laterality: Right;   REVISON OF ARTERIOVENOUS FISTULA Right 03/31/2023   Procedure: RIGHT ARM ARTERIOVENOUS FISTULA BANDING;  Surgeon: Adine Hoof, MD;  Location: [facility] OR;  Service: Vascular;  Laterality: Right;   REVISON OF ARTERIOVENOUS FISTULA Right 06/23/2023   Procedure: EXCISION OF PSEUDOANEURYSM RIGHT ARTERIOVENOUS FISTULA;  Surgeon: Dannis Dy, MD;  Location: [facility] OR;  Service: Vascular;  Laterality: Right;   RIGHT HEART CATH N/A 02/24/2023   Procedure: RIGHT HEART CATH;  Surgeon: Alwin Baars, DO;  [facility];  Service: Cardiovascular;  Laterality: N/A;   TEE WITHOUT CARDIOVERSION N/A 09/25/2022   Procedure: TRANSESOPHAGEAL ECHOCARDIOGRAM (TEE);  Surgeon: Boyce Byes, MD;  Location: [facility] INVASIVE CV LAB;  Service: Cardiovascular;  Laterality: N/A;   TOTAL NEPHRECTOMY Right    Social History   Socioeconomic History   Marital status: Married    Spouse name: Not on file   Number of children: 12   Years of education: 2   Highest education level: Not on file  Occupational History   Occupation: Retired  Tobacco Use   Smoking status: Never    Passive exposure: Past   Smokeless tobacco: Never  Vaping Use   Vaping status: Never Used  Substance and Sexual Activity   Alcohol use: No    Alcohol/week: 0.0 standard drinks of alcohol   Drug use: No   Sexual activity: Not on file  Other Topics Concern   Not on file  Social History Narrative   Lives at home with his wife.   Right-handed.   No caffeine use.   Social Drivers of Corporate investment banker Strain: Not on file  Food Insecurity:  Low Risk  (04/13/2024)   Received from [facility]   Hunger Vital Sign    Within the past 12 months, you worried that your food would run out before you got money to buy more: Never true    Within the past 12 months, the food you bought just didn't last and you didn't have money to get more. : Never true  Transportation Needs: No Transportation Needs (04/13/2024)   Received from [facility]    In the past 12 months, has lack of reliable transportation kept you from medical appointments, meetings, work or from getting things needed for daily living? : No  Physical Activity: Not on file  Stress: Not on file  Social Connections: Not on file   Family History  Problem Relation Age of Onset   Hyperlipidemia Mother    Hypertension Mother    Heart disease Mother        before age 23   Dementia Mother    Early death Father    Cancer Father  bone   Melanoma Father    Arthritis Sister    Cancer Sister        breast   Drug abuse Sister    Depression Sister    Alcohol abuse Sister    Cancer Sister        breast   CAD Other    Colon cancer Neg Hx    Rectal cancer Neg Hx    Stomach cancer Neg Hx    No Known Allergies Prior to Admission medications   Medication Sig Start Date End Date Taking? Authorizing Provider  atorvastatin  (LIPITOR) 40 MG tablet Take 1 tablet (40 mg total) by mouth daily. 07/13/23  Yes Loyde Rule, MD  diphenhydramine -acetaminophen  (TYLENOL  PM) 25-500 MG TABS tablet Take 2 tablets by mouth at bedtime.   Yes [provider]  ENVARSUS  XR 1 MG TB24 Take 5 mg by mouth in the morning. 10/31/21  Yes [provider]  flecainide  (TAMBOCOR ) 50 MG tablet Take 1 tablet (50 mg total) by mouth 2 (two) times daily. 04/12/24  Yes Tylene Galla, PA-C  furosemide  (LASIX ) 40 MG tablet Take 20 mg by mouth daily. Prescribed 01/30/23 Dr Jonetta Nest (nephrology) 02/02/23  Yes Nishan, Peter C, MD  losartan (COZAAR) 25 MG tablet Take 25 mg by  mouth daily.   Yes [provider]  magnesium  oxide (MAG-OX) 400 MG tablet Take 400 mg by mouth 2 (two) times daily. Lunch and supper 10/14/21  Yes [provider]  metoprolol  succinate (TOPROL -XL) 25 MG 24 hr tablet Take 1 tablet (25 mg total) by mouth 2 (two) times daily. 03/31/24  Yes Loyde Rule, MD  mycophenolate  (MYFORTIC ) 180 MG EC tablet Take 540 mg by mouth 2 (two) times daily. 11/12/21  Yes [provider]  niacinamide 500 MG tablet Take 500 mg by mouth 2 (two) times daily with a meal. 04/13/24  Yes [provider]  omeprazole (PRILOSEC) 20 MG capsule Take 20 mg by mouth daily as needed (Heartburn).   Yes [provider]  predniSONE  (DELTASONE ) 5 MG tablet Take 5 mg by mouth in the morning. 10/09/21  Yes [provider]  sulfamethoxazole -trimethoprim  (BACTRIM ) 400-80 MG tablet Take 1 tablet by mouth every Monday, Wednesday, and Friday. In the morning   Yes [provider]  XARELTO  20 MG TABS tablet Take 1 tablet (20 mg total) by mouth daily with supper. 05/04/24  Yes Dunn, Elvia Hammans, PA-C    ROS: All other systems have been reviewed and were otherwise negative with the exception of those mentioned in the HPI and as above.  Physical Exam: General: Alert, no acute distress Cardiovascular: No pedal edema Respiratory: No cyanosis, no use of accessory musculature GI: No organomegaly, abdomen is soft and non-tender Skin: No lesions in the area of chief complaint Neurologic: Sensation intact distally Psychiatric: Patient is competent for consent with normal mood and affect Lymphatic: No axillary or cervical lymphadenopathy  MUSCULOSKELETAL:  The range of motion of the left shoulder is to 150 degrees, passively to 170 degrees. External rotation to 45 degrees. Cuff strength is weak throughout testing.   Imaging: MRI today demonstrates a large tear of the supraspinatus with 3 cm of retraction and a high grade partial thickness tear  of the infraspinatus. There are early degenerative changes in the glenohumeral joint as well.   BMI: Estimated body mass index is 36.59 kg/m as calculated from the following:   Height as of 05/12/24: 5' 10 (1.778 m).   Weight as of  05/12/24: 115.7 kg.  Lab Results  Component Value Date   ALBUMIN 3.5 12/01/2016   Diabetes:   Patient has a diagnosis of diabetes,  Lab Results  Component Value Date   HGBA1C 6.1 (H) 05/12/2024   Smoking Status:   reports that he has never smoked. He has been exposed to tobacco smoke. He has never used smokeless tobacco.     Assessment: osteoarthritis of left shoulder  Plan: Plan for Procedure(s): ARTHROPLASTY, SHOULDER, TOTAL, REVERSE  The risks benefits and alternatives were discussed with the patient including but not limited to the risks of nonoperative treatment, versus surgical intervention including infection, bleeding, nerve injury,  blood clots, cardiopulmonary complications, morbidity, mortality, among others, and they were willing to proceed.   We additionally specifically discussed risks of axillary nerve injury, infection, periprosthetic fracture, continued pain and longevity of implants prior to beginning procedure.    Patient will be closely monitored in PACU for medical stabilization and pain control. If found stable in PACU, patient may be discharged home with outpatient follow-up. If any concerns regarding patient's stabilization patient will be admitted for observation after surgery. The patient is planning to be discharged home with outpatient PT.   The patient acknowledged the explanation, agreed to proceed with the plan and consent was signed.   Operative Plan: Left reverse total shoulder arthroplasty Discharge Medications: standard (no nsaids) DVT Prophylaxis: resume xarelto  Physical Therapy: outpatient PT Special Discharge needs: Sling. IceMan   Adine Ahmadi, PA-C  05/19/2024 12:42 PM

## 2024-05-22 NOTE — Progress Notes (Unsigned)
  Electrophysiology Office Note:   Date:  05/22/2024  ID:  AKIM WATKINSON, DOB 10/05/1961, MRN [id number]  Primary Cardiologist: Maude Emmer, MD Electrophysiologist: OLE ONEIDA HOLTS, MD  {Click to update primary MD,subspecialty MD or APP then REFRESH:1}    History of Present Illness:   [name] [name] is a 63 y.o. male with h/o Obesity, chronic diastolic CHF, h/o TIA, and persistent AF s/p prior ablatio  seen today for routine electrophysiology followup.   Since last being seen in our clinic the patient reports doing ***.  he denies chest pain, palpitations, dyspnea, PND, orthopnea, nausea, vomiting, dizziness, syncope, edema, weight gain, or early satiety.   Review of systems complete and found to be negative unless listed in HPI.   EP Information / Studies Reviewed:    EKG is ordered today. Personal review as below.       Arrhythmia/Device History S/p AF ablation 08/2022   Physical Exam:   VS:  There were no vitals taken for this visit.   Wt Readings from Last 3 Encounters:  05/12/24 255 lb (115.7 kg)  04/21/24 252 lb (114.3 kg)  04/12/24 252 lb (114.3 kg)     GEN: No acute distress NECK: No JVD; No carotid bruits CARDIAC: {EPRHYTHM:28826}, no murmurs, rubs, gallops RESPIRATORY:  Clear to auscultation without rales, wheezing or rhonchi  ABDOMEN: Soft, non-tender, non-distended EXTREMITIES:  {EDEMA LEVEL:28147::No} edema; No deformity   ASSESSMENT AND PLAN:    Atrial fibrillation EKG today shows sinus bradycardia Now with recurrence s/p Ablation 08/2022 On Xarelto  20 mg daily for CHA2DS2/VASc of at least 6 Amiodarone interacts with tacrolimus , and in discussion with pharm D would avoid.  Continue toprol  25 mg BID. May need to cut back.  Start flecainide  50 mg BID. EKG in 7-10 days.    Pt had mild non-obstructive CAD in 2019. Discussed with Dr. HOLTS and will use low dose flecainide  as a bridge to get him through his shoulder surgery, then bring back in to  discuss re-do ablation with PFA.  Pt is also interested in discussing Watchman procedure, potentially as a concomitant procedure.   {Click here to Review PMH, Prob List, Meds, Allergies, SHx, FHx  :1}   Follow up with {EPMDS:28135::EP Team} {EPFOLLOW UP:28173}  Signed, Ozell Prentice Passey, PA-C

## 2024-05-23 ENCOUNTER — Ambulatory Visit: Attending: Student | Admitting: Student

## 2024-05-23 ENCOUNTER — Encounter: Payer: Self-pay | Admitting: Student

## 2024-05-23 VITALS — BP 110/76 | HR 50 | Ht 70.0 in | Wt 255.5 lb

## 2024-05-23 DIAGNOSIS — I5032 Chronic diastolic (congestive) heart failure: Secondary | ICD-10-CM

## 2024-05-23 DIAGNOSIS — I480 Paroxysmal atrial fibrillation: Secondary | ICD-10-CM | POA: Diagnosis not present

## 2024-05-23 DIAGNOSIS — I10 Essential (primary) hypertension: Secondary | ICD-10-CM | POA: Diagnosis not present

## 2024-05-23 NOTE — Anesthesia Preprocedure Evaluation (Signed)
 Anesthesia Evaluation  Patient identified by MRN, date of birth, ID band Patient awake    Reviewed: Allergy & Precautions, NPO status , Patient's Chart, lab work & pertinent test results  History of Anesthesia Complications (+) history of anesthetic complications  Airway Mallampati: I  TM Distance: >3 FB Neck ROM: Full    Dental no notable dental hx. (+) Edentulous Upper, Edentulous Lower   Pulmonary shortness of breath   Pulmonary exam normal breath sounds clear to auscultation       Cardiovascular hypertension, + Peripheral Vascular Disease (AAA) and +CHF  Normal cardiovascular exam+ dysrhythmias (on Xarelto ) Atrial Fibrillation  Rhythm:Regular Rate:Normal  09/2023 Echo 1. Left ventricular ejection fraction, by estimation, is 55 to 60%. The  left ventricle has normal function. The left ventricle has no regional  wall motion abnormalities. There is mild asymmetric left ventricular  hypertrophy of the septal segment. Left  ventricular diastolic parameters were normal.   2. Right ventricular systolic function is normal. The right ventricular  size is normal. There is normal pulmonary artery systolic pressure. The  estimated right ventricular systolic pressure is 28.4 mmHg.   3. The mitral valve is abnormal. Trivial mitral valve regurgitation.   4. The aortic valve is tricuspid. Aortic valve regurgitation is trivial.   5. Aortic dilatation noted. There is borderline dilatation of the aortic  root, measuring 39 mm. There is mild dilatation of the ascending aorta,  measuring 40 mm.   6. The inferior vena cava is normal in size with greater than 50%  respiratory variability, suggesting right atrial pressure of 3 mmHg.      Neuro/Psych    GI/Hepatic ,GERD  ,,  Endo/Other  diabetes    Renal/GU Renal InsufficiencyRenal diseaseLab Results      Component                Value               Date                           K                         4.0                 05/12/2024                   CREATININE               1.59 (H)            05/12/2024                GFRNONAA                 48 (L)              05/12/2024                  GLUCOSE                  124 (H)             05/12/2024           S/P renal transplant 2020     Musculoskeletal  (+) Arthritis ,    Abdominal   Peds  Hematology Lab Results      Component  Value               Date                      WBC                      5.6                 05/12/2024                HGB                      14.2                05/12/2024                HCT                      44.2                05/12/2024                MCV                      97.1                05/12/2024                PLT                      137 (L)             05/12/2024              Anesthesia Other Findings right hemidiaphragmatic paralysis  Reproductive/Obstetrics                             Anesthesia Physical Anesthesia Plan  ASA: 3  Anesthesia Plan: Regional and General   Post-op Pain Management: Regional block*   Induction:   PONV Risk Score and Plan: 3 and Treatment may vary due to age or medical condition, Midazolam  and Ondansetron   Airway Management Planned: Oral ETT  Additional Equipment: None  Intra-op Plan:   Post-operative Plan: Extubation in OR and Possible Post-op intubation/ventilation  Informed Consent: I have reviewed the patients History and Physical, chart, labs and discussed the procedure including the risks, benefits and alternatives for the proposed anesthesia with the patient or authorized representative who has indicated his/her understanding and acceptance.     Dental advisory given  Plan Discussed with: CRNA and Surgeon  Anesthesia Plan Comments: (See PAT note 05/12/24  GA w L ISB only after discussion low dose. Pt agrees and wishes to proceed)       Anesthesia Quick Evaluation

## 2024-05-23 NOTE — Patient Instructions (Signed)
 Medication Instructions:  Your physician recommends that you continue on your current medications as directed. Please refer to the Current Medication list given to you today.  *If you need a refill on your cardiac medications before your next appointment, please call your pharmacy*  Lab Work: None ordered If you have labs (blood work) drawn today and your tests are completely normal, you will receive your results only by: MyChart Message (if you have MyChart) OR A paper copy in the mail If you have any lab test that is abnormal or we need to change your treatment, we will call you to review the results.  Follow-Up: At [facility], you and your health needs are our priority.  As part of our continuing mission to provide you with exceptional heart care, our providers are all part of one team.  This team includes your primary Cardiologist (physician) and Advanced Practice Providers or APPs (Physician Assistants and Nurse Practitioners) who all work together to provide you with the care you need, when you need it.  Your next appointment:   3 month(s)  Provider:   Ole Holts, MD to discuss ablation and [facility]

## 2024-05-24 NOTE — Discharge Instructions (Signed)
 Bonner Hair MD, MPH Aleck Stalling, PA-C [facility] [address], [address] [phone] (tel)   [phone] (fax)   POST-OPERATIVE INSTRUCTIONS - TOTAL SHOULDER REPLACEMENT    WOUND CARE You may leave the operative dressing in place until your follow-up appointment. KEEP THE INCISIONS CLEAN AND DRY. There may be a small amount of fluid/bleeding leaking at the surgical site. This is normal after surgery.  If it fills with liquid or blood please call us  immediately to change it for you. Use the provided ice machine or Ice packs as often as possible for the first 3-4 days, then as needed for pain relief.   Keep a layer of cloth or a shirt between your skin and the cooling unit to prevent frost bite as it can get very cold.  SHOWERING: - You may shower on Post-Op Day #2.  - The dressing is water  resistant but do not scrub it as it may start to peel up.   - You may remove the sling for showering - Gently pat the area dry.  - Do not soak the shoulder in water .  - Do not go swimming in the pool or ocean until your incision has completely healed (about 4-6 weeks after surgery) - KEEP THE INCISIONS CLEAN AND DRY.  EXERCISES Wear the sling at all times  You may remove the sling for showering, but keep the arm across the chest or in a secondary sling.    Accidental/Purposeful External Rotation and shoulder flexion (reaching behind you) is to be avoided at all costs for the first month. It is ok to come out of your sling if your are sitting and have assistance for eating.   Do not lift anything heavier than 1 pound until we discuss it further in clinic.  It is normal for your fingers/hand to become more swollen after surgery and discolored from bruising.   This will resolve over the first few weeks usually after surgery. Please continue to ambulate and do not stay sitting or lying for too long.  Perform foot and wrist pumps to assist in circulation.  PHYSICAL  THERAPY - You will begin physical therapy soon after surgery (unless otherwise specified) - Please call to set up an appointment, if you do not already have one  - Let our office if there are any issues with scheduling your therapy  - You have a physical therapy appointment scheduled at [facility] (across the hall from our office) on 6/27   REGIONAL ANESTHESIA (NERVE BLOCKS) The anesthesia team may have performed a nerve block for you this is a great tool used to minimize pain.   The block may start wearing off overnight (between 8-24 hours postop) When the block wears off, your pain may go from nearly zero to the pain you would have had postop without the block. This is an abrupt transition but nothing dangerous is happening.   This can be a challenging period but utilize your as needed pain medications to try and manage this period. We suggest you use the pain medication the first night prior to going to bed, to ease this transition.  You may take an extra dose of narcotic when this happens if needed   POST-OP MEDICATIONS- Multimodal approach to pain control In general your pain will be controlled with a combination of substances.  Prescriptions unless otherwise discussed are electronically sent to your pharmacy.  This is a carefully made plan we use to minimize narcotic use.  Acetaminophen  - Non-narcotic pain medicine taken on a scheduled basis  Oxycodone  - This is a strong narcotic, to be used only on an "as needed" basis for SEVERE pain. Zofran  -  take as needed for nausea  You may resume your Xarelto  24 hours after surgery   FOLLOW-UP If you develop a Fever (>101.5), Redness or Drainage from the surgical incision site, please call our office to arrange for an evaluation. Please call the office to schedule a follow-up appointment for a wound check, 7-10 days post-operatively.  IF YOU HAVE ANY QUESTIONS, PLEASE FEEL FREE TO CALL OUR OFFICE.  HELPFUL INFORMATION  Your arm will  be in a sling following surgery. You will be in this sling for the next 4 weeks.   You may be more comfortable sleeping in a semi-seated position the first few nights following surgery.  Keep a pillow propped under the elbow and forearm for comfort.  If you have a recliner type of chair it might be beneficial.  If not that is fine too, but it would be helpful to sleep propped up with pillows behind your operated shoulder as well under your elbow and forearm.  This will reduce pulling on the suture lines.  When dressing, put your operative arm in the sleeve first.  When getting undressed, take your operative arm out last.  Loose fitting, button-down shirts are recommended.  In most states it is against the law to drive while your arm is in a sling. And certainly against the law to drive while taking narcotics.  You may return to work/school in the next couple of days when you feel up to it. Desk work and typing in the sling is fine.  We suggest you use the pain medication the first night prior to going to bed, in order to ease any pain when the anesthesia wears off. You should avoid taking pain medications on an empty stomach as it will make you nauseous.  You should wean off your narcotic medicines as soon as you are able.     Most patients will be off narcotics before their first postop appointment.   Do not drink alcoholic beverages or take illicit drugs when taking pain medications.  Pain medication may make you constipated.  Below are a few solutions to try in this order: Decrease the amount of pain medication if you aren't having pain. Drink lots of decaffeinated fluids. Drink prune juice and/or each dried prunes  If the first 3 don't work start with additional solutions Take Colace - an over-the-counter stool softener Take Senokot - an over-the-counter laxative Take Miralax - a stronger over-the-counter laxative   Dental Antibiotics:  We require dental prophylaxis for 2 years  after a shoulder replacement  Contact your surgeon for an antibiotic prescription, prior to your dental procedure.   For more information including helpful videos and documents visit our website:   [email or web address]

## 2024-05-25 ENCOUNTER — Ambulatory Visit (HOSPITAL_COMMUNITY)

## 2024-05-25 ENCOUNTER — Ambulatory Visit (HOSPITAL_COMMUNITY): Payer: Self-pay | Admitting: Physician Assistant

## 2024-05-25 ENCOUNTER — Ambulatory Visit (HOSPITAL_COMMUNITY)
Admission: RE | Admit: 2024-05-25 | Discharge: 2024-05-25 | Disposition: A | Discharge status: Home / Self Care | Attending: Orthopaedic Surgery | Admitting: Orthopaedic Surgery

## 2024-05-25 ENCOUNTER — Encounter (HOSPITAL_COMMUNITY): Payer: Self-pay | Admitting: Orthopaedic Surgery

## 2024-05-25 ENCOUNTER — Encounter (HOSPITAL_COMMUNITY)
Admission: RE | Disposition: A | Discharge status: Home / Self Care | Payer: Self-pay | Source: Home / Self Care | Attending: Orthopaedic Surgery

## 2024-05-25 ENCOUNTER — Other Ambulatory Visit: Payer: Self-pay

## 2024-05-25 ENCOUNTER — Ambulatory Visit (HOSPITAL_BASED_OUTPATIENT_CLINIC_OR_DEPARTMENT_OTHER): Payer: Self-pay | Admitting: Anesthesiology

## 2024-05-25 DIAGNOSIS — I5032 Chronic diastolic (congestive) heart failure: Secondary | ICD-10-CM

## 2024-05-25 DIAGNOSIS — M75102 Unspecified rotator cuff tear or rupture of left shoulder, not specified as traumatic: Secondary | ICD-10-CM | POA: Insufficient documentation

## 2024-05-25 DIAGNOSIS — I2510 Atherosclerotic heart disease of native coronary artery without angina pectoris: Secondary | ICD-10-CM | POA: Insufficient documentation

## 2024-05-25 DIAGNOSIS — N182 Chronic kidney disease, stage 2 (mild): Secondary | ICD-10-CM | POA: Diagnosis not present

## 2024-05-25 DIAGNOSIS — M19012 Primary osteoarthritis, left shoulder: Secondary | ICD-10-CM

## 2024-05-25 DIAGNOSIS — Z940 Kidney transplant status: Secondary | ICD-10-CM | POA: Insufficient documentation

## 2024-05-25 DIAGNOSIS — Z7901 Long term (current) use of anticoagulants: Secondary | ICD-10-CM | POA: Insufficient documentation

## 2024-05-25 DIAGNOSIS — I130 Hypertensive heart and chronic kidney disease with heart failure and stage 1 through stage 4 chronic kidney disease, or unspecified chronic kidney disease: Secondary | ICD-10-CM | POA: Diagnosis not present

## 2024-05-25 DIAGNOSIS — E1122 Type 2 diabetes mellitus with diabetic chronic kidney disease: Secondary | ICD-10-CM | POA: Diagnosis not present

## 2024-05-25 DIAGNOSIS — K219 Gastro-esophageal reflux disease without esophagitis: Secondary | ICD-10-CM | POA: Diagnosis not present

## 2024-05-25 DIAGNOSIS — Z8679 Personal history of other diseases of the circulatory system: Secondary | ICD-10-CM | POA: Diagnosis not present

## 2024-05-25 DIAGNOSIS — I132 Hypertensive heart and chronic kidney disease with heart failure and with stage 5 chronic kidney disease, or end stage renal disease: Secondary | ICD-10-CM | POA: Diagnosis not present

## 2024-05-25 DIAGNOSIS — E1169 Type 2 diabetes mellitus with other specified complication: Secondary | ICD-10-CM

## 2024-05-25 DIAGNOSIS — Z01818 Encounter for other preprocedural examination: Secondary | ICD-10-CM

## 2024-05-25 DIAGNOSIS — Z8249 Family history of ischemic heart disease and other diseases of the circulatory system: Secondary | ICD-10-CM | POA: Diagnosis not present

## 2024-05-25 DIAGNOSIS — Z8261 Family history of arthritis: Secondary | ICD-10-CM | POA: Insufficient documentation

## 2024-05-25 DIAGNOSIS — I4891 Unspecified atrial fibrillation: Secondary | ICD-10-CM | POA: Diagnosis not present

## 2024-05-25 DIAGNOSIS — N1832 Chronic kidney disease, stage 3b: Secondary | ICD-10-CM | POA: Diagnosis not present

## 2024-05-25 DIAGNOSIS — Z9884 Bariatric surgery status: Secondary | ICD-10-CM | POA: Insufficient documentation

## 2024-05-25 DIAGNOSIS — E1151 Type 2 diabetes mellitus with diabetic peripheral angiopathy without gangrene: Secondary | ICD-10-CM | POA: Insufficient documentation

## 2024-05-25 HISTORY — PX: REVERSE SHOULDER ARTHROPLASTY: SHX5054

## 2024-05-25 LAB — GLUCOSE, CAPILLARY
Glucose-Capillary: 107 mg/dL — ABNORMAL HIGH (ref 70–99)
Glucose-Capillary: 129 mg/dL — ABNORMAL HIGH (ref 70–99)

## 2024-05-25 SURGERY — ARTHROPLASTY, SHOULDER, TOTAL, REVERSE
Anesthesia: Regional | Site: Shoulder | Laterality: Left

## 2024-05-25 MED ORDER — PHENYLEPHRINE 80 MCG/ML (10ML) SYRINGE FOR IV PUSH (FOR BLOOD PRESSURE SUPPORT)
PREFILLED_SYRINGE | INTRAVENOUS | Status: AC
  Filled 2024-05-25: qty 10

## 2024-05-25 MED ORDER — DEXAMETHASONE SODIUM PHOSPHATE 10 MG/ML IJ SOLN
INTRAMUSCULAR | Status: DC | PRN
Start: 2024-05-25 — End: 2024-05-25
  Administered 2024-05-25: 5 mg via INTRAVENOUS

## 2024-05-25 MED ORDER — PROPOFOL 10 MG/ML IV BOLUS
INTRAVENOUS | Status: AC
  Filled 2024-05-25: qty 20

## 2024-05-25 MED ORDER — PROPOFOL 1000 MG/100ML IV EMUL
INTRAVENOUS | Status: AC
  Filled 2024-05-25: qty 100

## 2024-05-25 MED ORDER — EPHEDRINE SULFATE-NACL 50-0.9 MG/10ML-% IV SOSY
PREFILLED_SYRINGE | INTRAVENOUS | Status: DC | PRN
  Administered 2024-05-25 (×2): 5 mg via INTRAVENOUS

## 2024-05-25 MED ORDER — LACTATED RINGERS IV SOLN: INTRAVENOUS | Status: DC

## 2024-05-25 MED ORDER — BUPIVACAINE LIPOSOME 1.3 % IJ SUSP
INTRAMUSCULAR | Status: DC | PRN
  Administered 2024-05-25: 10 mL via PERINEURAL

## 2024-05-25 MED ORDER — ROCURONIUM BROMIDE 10 MG/ML (PF) SYRINGE
PREFILLED_SYRINGE | INTRAVENOUS | Status: AC
  Filled 2024-05-25: qty 10

## 2024-05-25 MED ORDER — SODIUM CHLORIDE 0.9 % IR SOLN
Status: DC | PRN
  Administered 2024-05-25: 1000 mL

## 2024-05-25 MED ORDER — PROPOFOL 10 MG/ML IV BOLUS
INTRAVENOUS | Status: DC | PRN
  Administered 2024-05-25: 150 mg via INTRAVENOUS
  Administered 2024-05-25: 125 ug/kg/min via INTRAVENOUS

## 2024-05-25 MED ORDER — 0.9 % SODIUM CHLORIDE (POUR BTL) OPTIME
TOPICAL | Status: DC | PRN
  Administered 2024-05-25: 1000 mL

## 2024-05-25 MED ORDER — MIDAZOLAM HCL 2 MG/2ML IJ SOLN
INTRAMUSCULAR | Status: AC
Start: 2024-05-25 — End: 2024-05-25
  Filled 2024-05-25: qty 2

## 2024-05-25 MED ORDER — LIDOCAINE HCL (PF) 2 % IJ SOLN
INTRAMUSCULAR | Status: AC
  Filled 2024-05-25: qty 5

## 2024-05-25 MED ORDER — CHLORHEXIDINE GLUCONATE 0.12 % MT SOLN
15.0000 mL | Freq: Once | OROMUCOSAL | Status: AC
  Administered 2024-05-25: 15 mL via OROMUCOSAL

## 2024-05-25 MED ORDER — ACETAMINOPHEN 500 MG PO TABS
1000.0000 mg | ORAL_TABLET | Freq: Once | ORAL | Status: AC
  Administered 2024-05-25: 1000 mg via ORAL
  Filled 2024-05-25: qty 2

## 2024-05-25 MED ORDER — ACETAMINOPHEN 500 MG PO TABS: 1000.0000 mg | ORAL_TABLET | Freq: Three times a day (TID) | ORAL | 0 refills | Status: AC

## 2024-05-25 MED ORDER — HYDROMORPHONE HCL 1 MG/ML IJ SOLN: 0.2500 mg | INTRAMUSCULAR | Status: DC | PRN

## 2024-05-25 MED ORDER — EPHEDRINE 5 MG/ML INJ
INTRAVENOUS | Status: AC
Start: 2024-05-25 — End: 2024-05-25
  Filled 2024-05-25: qty 5

## 2024-05-25 MED ORDER — OXYCODONE HCL 5 MG PO TABS: 5.0000 mg | ORAL_TABLET | Freq: Once | ORAL | Status: DC | PRN

## 2024-05-25 MED ORDER — ROCURONIUM BROMIDE 10 MG/ML (PF) SYRINGE
PREFILLED_SYRINGE | INTRAVENOUS | Status: DC | PRN
  Administered 2024-05-25: 70 mg via INTRAVENOUS

## 2024-05-25 MED ORDER — OXYCODONE HCL 5 MG PO TABS: ORAL_TABLET | ORAL | 0 refills | Status: AC

## 2024-05-25 MED ORDER — ORAL CARE MOUTH RINSE: 15.0000 mL | Freq: Once | OROMUCOSAL | Status: AC

## 2024-05-25 MED ORDER — ORAL CARE MOUTH RINSE
15.0000 mL | Freq: Once | OROMUCOSAL | Status: AC
Start: 2024-05-25 — End: 2024-05-25

## 2024-05-25 MED ORDER — VANCOMYCIN HCL 1 G IV SOLR
INTRAVENOUS | Status: DC | PRN
  Administered 2024-05-25: 1000 mg via TOPICAL

## 2024-05-25 MED ORDER — VANCOMYCIN HCL 1000 MG IV SOLR
INTRAVENOUS | Status: AC
  Filled 2024-05-25: qty 40

## 2024-05-25 MED ORDER — OXYCODONE HCL 5 MG/5ML PO SOLN: 5.0000 mg | Freq: Once | ORAL | Status: DC | PRN

## 2024-05-25 MED ORDER — ONDANSETRON HCL 4 MG/2ML IJ SOLN
INTRAMUSCULAR | Status: DC | PRN
  Administered 2024-05-25: 4 mg via INTRAVENOUS

## 2024-05-25 MED ORDER — FENTANYL CITRATE (PF) 100 MCG/2ML IJ SOLN
INTRAMUSCULAR | Status: DC | PRN
  Administered 2024-05-25: 25 ug via INTRAVENOUS
  Administered 2024-05-25: 50 ug via INTRAVENOUS
  Administered 2024-05-25: 25 ug via INTRAVENOUS

## 2024-05-25 MED ORDER — ONDANSETRON HCL 4 MG/2ML IJ SOLN
INTRAMUSCULAR | Status: AC
  Filled 2024-05-25: qty 2

## 2024-05-25 MED ORDER — ACETAMINOPHEN 10 MG/ML IV SOLN: 1000.0000 mg | Freq: Once | INTRAVENOUS | Status: DC | PRN

## 2024-05-25 MED ORDER — GABAPENTIN 100 MG PO CAPS: 100.0000 mg | ORAL_CAPSULE | Freq: Three times a day (TID) | ORAL | 0 refills | Status: AC

## 2024-05-25 MED ORDER — SUGAMMADEX SODIUM 200 MG/2ML IV SOLN
INTRAVENOUS | Status: DC | PRN
  Administered 2024-05-25: 200 mg via INTRAVENOUS

## 2024-05-25 MED ORDER — ONDANSETRON HCL 4 MG/2ML IJ SOLN: 4.0000 mg | Freq: Once | INTRAMUSCULAR | Status: DC | PRN

## 2024-05-25 MED ORDER — AMISULPRIDE (ANTIEMETIC) 5 MG/2ML IV SOLN: 10.0000 mg | Freq: Once | INTRAVENOUS | Status: DC | PRN

## 2024-05-25 MED ORDER — ONDANSETRON HCL 4 MG PO TABS: 4.0000 mg | ORAL_TABLET | Freq: Three times a day (TID) | ORAL | 0 refills | Status: AC | PRN

## 2024-05-25 MED ORDER — TRANEXAMIC ACID-NACL 1000-0.7 MG/100ML-% IV SOLN
1000.0000 mg | INTRAVENOUS | Status: AC
Start: 2024-05-25 — End: 2024-05-25
  Administered 2024-05-25: 1000 mg via INTRAVENOUS
  Filled 2024-05-25: qty 100

## 2024-05-25 MED ORDER — CEFAZOLIN SODIUM-DEXTROSE 2-4 GM/100ML-% IV SOLN
2.0000 g | INTRAVENOUS | Status: AC
  Administered 2024-05-25: 2 g via INTRAVENOUS
  Filled 2024-05-25: qty 100

## 2024-05-25 MED ORDER — MIDAZOLAM HCL 2 MG/2ML IJ SOLN
1.0000 mg | Freq: Once | INTRAMUSCULAR | Status: AC
  Administered 2024-05-25: 1 mg via INTRAVENOUS

## 2024-05-25 MED ORDER — FENTANYL CITRATE PF 50 MCG/ML IJ SOSY: 50.0000 ug | PREFILLED_SYRINGE | Freq: Once | INTRAMUSCULAR | Status: DC

## 2024-05-25 MED ORDER — PHENYLEPHRINE 80 MCG/ML (10ML) SYRINGE FOR IV PUSH (FOR BLOOD PRESSURE SUPPORT)
PREFILLED_SYRINGE | INTRAVENOUS | Status: DC | PRN
  Administered 2024-05-25: 160 ug via INTRAVENOUS

## 2024-05-25 MED ORDER — KETAMINE HCL 50 MG/5ML IJ SOSY
PREFILLED_SYRINGE | INTRAMUSCULAR | Status: AC
Start: 2024-05-25 — End: 2024-05-25
  Filled 2024-05-25: qty 5

## 2024-05-25 MED ORDER — BUPIVACAINE HCL (PF) 0.25 % IJ SOLN
INTRAMUSCULAR | Status: DC | PRN
  Administered 2024-05-25: 10 mL via PERINEURAL

## 2024-05-25 MED ORDER — LIDOCAINE HCL (PF) 2 % IJ SOLN
INTRAMUSCULAR | Status: DC | PRN
Start: 2024-05-25 — End: 2024-05-25
  Administered 2024-05-25: 100 mg via INTRADERMAL

## 2024-05-25 MED ORDER — FENTANYL CITRATE (PF) 100 MCG/2ML IJ SOLN
INTRAMUSCULAR | Status: AC
  Filled 2024-05-25: qty 2

## 2024-05-25 MED ORDER — PHENYLEPHRINE HCL-NACL 20-0.9 MG/250ML-% IV SOLN
INTRAVENOUS | Status: DC | PRN
  Administered 2024-05-25: 35 ug/min via INTRAVENOUS

## 2024-05-25 MED ORDER — DEXAMETHASONE SODIUM PHOSPHATE 10 MG/ML IJ SOLN
INTRAMUSCULAR | Status: AC
  Filled 2024-05-25: qty 1

## 2024-05-25 MED ORDER — KETAMINE HCL 50 MG/5ML IJ SOSY
PREFILLED_SYRINGE | INTRAMUSCULAR | Status: DC | PRN
  Administered 2024-05-25: 15 mg via INTRAVENOUS

## 2024-05-25 SURGICAL SUPPLY — 55 items
BAG COUNTER SPONGE SURGICOUNT (BAG) ×1 IMPLANT
BASEPLATE SHOULDER FW 15D 29 (Joint) IMPLANT
BIT DRILL 3.2 PERIPHERAL SCREW (BIT) IMPLANT
BLADE SAW SGTL 73X25 THK (BLADE) ×1 IMPLANT
CHLORAPREP W/TINT 26 (MISCELLANEOUS) ×2 IMPLANT
CLSR STERI-STRIP ANTIMIC 1/2X4 (GAUZE/BANDAGES/DRESSINGS) ×1 IMPLANT
COOLER ICEMAN CLASSIC (MISCELLANEOUS) ×1 IMPLANT
COVER BACK TABLE 60X90IN (DRAPES) ×1 IMPLANT
COVER SURGICAL LIGHT HANDLE (MISCELLANEOUS) ×1 IMPLANT
CUP HUM INSERT SHLD 3/4 39 +0 (Cup) IMPLANT
DRAPE C-ARM 42X120 X-RAY (DRAPES) IMPLANT
DRAPE INCISE IOBAN 66X45 STRL (DRAPES) ×1 IMPLANT
DRAPE POUCH INSTRU U-SHP 10X18 (DRAPES) ×1 IMPLANT
DRAPE SHEET LG 3/4 BI-LAMINATE (DRAPES) ×2 IMPLANT
DRAPE SURG ORHT 6 SPLT 77X108 (DRAPES) ×2 IMPLANT
DRSG AQUACEL AG ADV 3.5X 6 (GAUZE/BANDAGES/DRESSINGS) ×1 IMPLANT
ELECT BLADE TIP CTD 4 INCH (ELECTRODE) ×1 IMPLANT
ELECT PENCIL ROCKER SW 15FT (MISCELLANEOUS) ×1 IMPLANT
ELECT REM PT RETURN 15FT ADLT (MISCELLANEOUS) ×1 IMPLANT
FACESHIELD WRAPAROUND (MASK) ×3 IMPLANT
FACESHIELD WRAPAROUND OR TEAM (MASK) ×3 IMPLANT
GLENOSPHERE STD 39 (Joint) IMPLANT
GLOVE BIO SURGEON STRL SZ 6.5 (GLOVE) ×2 IMPLANT
GLOVE BIOGEL PI IND STRL 6.5 (GLOVE) ×1 IMPLANT
GLOVE BIOGEL PI IND STRL 8 (GLOVE) ×1 IMPLANT
GLOVE ECLIPSE 8.0 STRL XLNG CF (GLOVE) ×2 IMPLANT
GOWN STRL REUS W/ TWL LRG LVL3 (GOWN DISPOSABLE) ×2 IMPLANT
GUIDEWIRE GLENOID 2.5X220 (WIRE) IMPLANT
KIT BASIN OR (CUSTOM PROCEDURE TRAY) ×1 IMPLANT
KIT STABILIZATION SHOULDER (MISCELLANEOUS) ×1 IMPLANT
KIT TURNOVER KIT A (KITS) ×1 IMPLANT
MANIFOLD NEPTUNE II (INSTRUMENTS) ×1 IMPLANT
NS IRRIG 1000ML POUR BTL (IV SOLUTION) ×1 IMPLANT
PACK SHOULDER (CUSTOM PROCEDURE TRAY) ×1 IMPLANT
PAD COLD SHLDR WRAP-ON (PAD) ×1 IMPLANT
PIN GUIDE 3X75 SHOULDER (PIN) IMPLANT
RESTRAINT HEAD UNIVERSAL NS (MISCELLANEOUS) ×1 IMPLANT
SCREW 5.5X26 (Screw) IMPLANT
SCREW BONE 6.5X40 SM (Screw) IMPLANT
SCREW PERIPHERAL 30 (Screw) IMPLANT
SCREW PERIPHERAL 5.0X34 (Screw) IMPLANT
SET HNDPC FAN SPRY TIP SCT (DISPOSABLE) ×1 IMPLANT
SLING ULTRA II L (ORTHOPEDIC SUPPLIES) IMPLANT
SPONGE T-LAP 4X18 ~~LOC~~+RFID (SPONGE) IMPLANT
STEM HUMERAL STD SHORT SZ3 (Joint) IMPLANT
SUCTION TUBE FRAZIER 12FR DISP (SUCTIONS) IMPLANT
SUT ETHIBOND 2 V 37 (SUTURE) ×1 IMPLANT
SUT ETHIBOND NAB CT1 #1 30IN (SUTURE) ×1 IMPLANT
SUT MNCRL AB 4-0 PS2 18 (SUTURE) ×1 IMPLANT
SUT VIC AB 0 CT1 36 (SUTURE) IMPLANT
SUT VIC AB 3-0 SH 27X BRD (SUTURE) ×1 IMPLANT
SUTURE FIBERWR #5 38 CONV NDL (SUTURE) ×2 IMPLANT
TOWEL OR 17X26 10 PK STRL BLUE (TOWEL DISPOSABLE) ×1 IMPLANT
TUBE SUCTION HIGH CAP CLEAR NV (SUCTIONS) ×1 IMPLANT
WATER STERILE IRR 1000ML POUR (IV SOLUTION) ×2 IMPLANT

## 2024-05-25 NOTE — Op Note (Signed)
 Orthopaedic Surgery Operative Note (CSN: [id number])  [name] [name]  12-01-1961 Date of Surgery: 05/25/2024   Diagnoses:  Irreparable cuff tear, left shoulder  Procedure: Left reverse  Total Shoulder Arthroplasty   Operative Finding Successful completion of planned procedure. Good bone quality, tug test normal.  Irreparable cuff tear.  Post-operative plan: The patient will be NWB in sling.  The patient will be will be discharged from PACU if continues to be stable as was plan prior to surgery.  DVT prophylaxis Aspirin  81 mg twice daily for 6 weeks.  Pain control with PRN pain medication preferring oral medicines.  Follow up plan will be scheduled in approximately 7 days for incision check and XR.  Physical therapy to start immediately.  Implants: Tornier 3 stem, 0 retentive poly, 39 standard sphere, full wedge 29 baseplate, 40 center screw  Post-Op Diagnosis: Same Surgeons:Primary: Cristy Bonner DASEN, MD Assistants:Caroline McBane, PA-C Location: [facility] ROOM 09 Anesthesia: General with Exparel  Interscalene Antibiotics: Ancef  2g preop, Vancomycin 1000mg  locally Tourniquet time: None Estimated Blood Loss: 100 Complications: None Specimens: None Implants: Implant Name Type Inv. Item Serial No. Manufacturer Lot No. LRB No. Used Action  BASEPLATE SHOULDER FW 15D 29 - [id number] Joint BASEPLATE SHOULDER FW 15D 29 [id number] [employer]  Left 1 Implanted  GLENOSPHERE STD 39 - [id number] Joint GLENOSPHERE STD 39 [id number] [employer]  Left 1 Implanted  SCREW BONE 6.5X40 SM - [id number] Screw SCREW BONE 6.5X40 SM  [employer]  Left 1 Implanted  SCREW 5.5X26 - [id number] Screw SCREW 5.5X26  [employer]  Left 2 Implanted  SCREW PERIPHERAL 5.0X34 - [id number] Screw SCREW PERIPHERAL 5.0X34  [employer]  Left 1 Implanted  SCREW PERIPHERAL 30 - [id number] Screw SCREW PERIPHERAL 30  [employer]  Left 1 Implanted  STEM HUMERAL STD SHORT SZ3 - [id number] Joint STEM HUMERAL STD SHORT SZ3 [id number]  [employer]  Left 1 Implanted  CUP HUM INSERT SHLD 3/4 39 +0 - [id number] Cup CUP HUM INSERT SHLD 3/4 39 +0 [id number] [employer]  Left 1 Implanted    Indications for Surgery:   [name] [name] is a 63 y.o. male with irreparable cuff tear.  Benefits and risks of operative and nonoperative management were discussed prior to surgery with patient/guardian(s) and informed consent form was completed.  Infection and need for further surgery were discussed as was prosthetic stability and cuff issues.  We additionally specifically discussed risks of axillary nerve injury, infection, periprosthetic fracture, continued pain and longevity of implants prior to beginning procedure.      Procedure:   The patient was identified in the preoperative holding area where the surgical site was marked. Block placed by anesthesia with exparel .  The patient was taken to the OR where a procedural timeout was called and the above noted anesthesia was induced.  The patient was positioned beachchair on allen table with spider arm positioner.  Preoperative antibiotics were dosed.  The patient's left shoulder was prepped and draped in the usual sterile fashion.  A second preoperative timeout was called.      Standard deltopectoral approach was performed with a #10 blade. We dissected down to the subcutaneous tissues and the cephalic vein was taken laterally with the deltoid. Clavipectoral fascia was incised in line with the incision. Deep retractors were placed. The long of the biceps tendon was identified and there was significant tenosynovitis present.  Tenodesis was performed to the pectoralis tendon with #2 Ethibond. The remaining biceps was followed up into the rotator interval  where it was released.   The subscapularis was taken down in a full thickness layer with capsule along the humeral neck extending inferiorly around the humeral head. We continued releasing the capsule directly off of the osteophytes inferiorly all  the way around the corner. This allowed us  to dislocate the humeral head.   The rotator cuff was carefully examined and noted to be irreperably torn.  The decision was confirmed that a reverse total shoulder was indicated for this patient.  There were osteophytes along the inferior humeral neck. The osteophytes were removed with an osteotome and a rongeur.  Osteophytes were removed with a rongeur and an osteotome and the anatomic neck was well visualized.     A humeral cutting guide was used extra medullary with a pin to help control version. The version was set at 20 of retroversion. Humeral osteotomy was performed with an oscillating saw. The head fragment was passed off the back table.  A cut protector plate was placed.  The subscapularis was again identified and immediately we took care to palpate the axillary nerve anteriorly and verify its position with gentle palpation as well as the tug test.  We then released the SGHL with bovie cautery prior to placing a curved mayo at the junction of the anterior glenoid well above the axillary nerve and bluntly dissecting the subscapularis from the capsule.  We then carefully protected the axillary nerve as we gently released the inferior capsule to fully mobilize the subscapularis.  An anterior deltoid retractor was then placed as well as a small Hohmann retractor superiorly.   The glenoid was relatively intact in the setting of an irreparable cuff tear  The remaining labrum was removed circumferentially taking great care not to disrupt the posterior capsule.   At this point we felt based on blueprint templating that a full wedge augment was necessary.  We began by using a full wedge guide to place our center pin as was templated.  We had good position of this pin and we proceeded with our starter center drill.  This allowed for us  to use the 15 degree full wedge reamer obtaining circumferential witness marks and good bone preparation for ingrowth.  At  this point we proceeded with our center drill and had an intact vault.  We then drilled our center screw to a length of 40 mm.    We selected a 6.5 mm x 40 mm screw and the full wedge baseplate which was placed in the same orientation as our reaming.  We double checked that we had good apposition of the base plate to bone and then proceeded to place 3 locking screws and one nonlocking screw as is typical.   Next a 39 mm glenosphere was selected and impacted onto the baseplate. The center screw was tightened.  We turned attention back to the humeral side. The cut protector was removed.  We used the perform humeral sizing block to select the appropriate size which for this patient was a 3.  We then placed our center pin and reamed over it concentrically obtaining appropriate inset.  We then used our lateralizing chisel to prepare the lateral aspect of the humerus.  At that point we selected the appropriate implant trialing a 3.  Using this trial implant we trialed multiple polyethylene sizes settling on a 0 which provided good stability and range of motion without excess soft tissue tension. The offset was dialed in to match the normal anatomy. The shoulder was trialed.  There  was good ROM in all planes and the shoulder was stable with no inferior translation.  The real humeral implants were opened after again confirming sizes.  The trial was removed. #5 Fiberwire x4 sutures passed through the humeral neck for subscap repair. The humeral component was press-fit obtaining a secure fit. The joint was reduced and thoroughly irrigated with pulsatile lavage. Subscap was repaired back with #5 Fiberwire sutures through bone tunnels. Hemostasis was obtained. The deltopectoral interval was reapproximated with #1 Ethibond. The subcutaneous tissues were closed with 2-0 Vicryl and the skin was closed with running monocryl.    The wounds were cleaned and dried and an Aquacel dressing was placed. The drapes taken down. The  arm was placed into sling with abduction pillow. Patient was awakened, extubated, and transferred to the recovery room in stable condition. There were no intraoperative complications. The sponge, needle, and attention counts were  correct at the end of the case.     Aleck Stalling, PA-C, present and scrubbed throughout the case, critical for completion in a timely fashion, and for retraction, instrumentation, closure.

## 2024-05-25 NOTE — Interval H&P Note (Signed)
 All questions answered, patient wants to proceed with procedure. ? ?

## 2024-05-25 NOTE — Anesthesia Postprocedure Evaluation (Signed)
 Anesthesia Post Note  Patient: [name] [name]  Procedure(s) Performed: ARTHROPLASTY, SHOULDER, TOTAL, REVERSE (Left: Shoulder)     Patient location during evaluation: PACU Anesthesia Type: Regional and General Level of consciousness: awake and alert Pain management: pain level controlled Vital Signs Assessment: post-procedure vital signs reviewed and stable Respiratory status: spontaneous breathing, nonlabored ventilation, respiratory function stable and patient connected to nasal cannula oxygen Cardiovascular status: blood pressure returned to baseline and stable Postop Assessment: no apparent nausea or vomiting Anesthetic complications: no  No notable events documented.  Last Vitals:  Vitals:   05/25/24 1026 05/25/24 1030  BP: (!) 123/95 (!) 123/95  Pulse: (!) 55 (!) 57  Resp: 20   Temp: 36.7 C   SpO2: 94% 96%    Last Pain:  Vitals:   05/25/24 1026  TempSrc: Oral  PainSc: 0-No pain                 Garnette DELENA Gab

## 2024-05-25 NOTE — Transfer of Care (Signed)
 Immediate Anesthesia Transfer of Care Note  Patient: [name] [name]  Procedure(s) Performed: ARTHROPLASTY, SHOULDER, TOTAL, REVERSE (Left: Shoulder)  Patient Location: PACU  Anesthesia Type:General and Regional  Level of Consciousness: awake, alert , and oriented  Airway & Oxygen Therapy: Patient Spontanous Breathing and Patient connected to face mask oxygen  Post-op Assessment: Report given to RN and Post -op Vital signs reviewed and stable  Post vital signs: Reviewed and stable  Last Vitals:  Vitals Value Taken Time  BP 173/89 05/25/24 09:37  Temp    Pulse 64 05/25/24 09:39  Resp 20 05/25/24 09:39  SpO2 100 % 05/25/24 09:39  Vitals shown include unfiled device data.  Last Pain:  Vitals:   05/25/24 0713  TempSrc:   PainSc: 0-No pain         Complications: No notable events documented.

## 2024-05-25 NOTE — Anesthesia Procedure Notes (Signed)
 Anesthesia Regional Block: Interscalene brachial plexus block   Pre-Anesthetic Checklist: , timeout performed,  Correct Patient, Correct Site, Correct Laterality,  Correct Procedure, Correct Position, site marked,  Risks and benefits discussed,  Surgical consent,  Pre-op  evaluation,  At surgeon's request and post-op pain management  Laterality: Upper and Left  Prep: Maximum Sterile Barrier Precautions used, chloraprep       Needles:  Injection technique: Single-shot  Needle Type: Echogenic Needle     Needle Length: 5cm  Needle Gauge: 21     Additional Needles:   Procedures:,,,, ultrasound used (permanent image in chart),,    Narrative:  Start time: 05/25/2024 7:48 AM End time: 05/25/2024 7:53 AM Injection made incrementally with aspirations every 5 mL.  Performed by: Personally  Anesthesiologist: Jefm Garnette LABOR, MD  Additional Notes: Block assessed prior to procedure. Patient tolerated procedure well.

## 2024-05-25 NOTE — Anesthesia Procedure Notes (Signed)
 Procedure Name: Intubation Date/Time: 05/25/2024 8:25 AM  Performed by: Zulema Leita PARAS, CRNAPre-anesthesia Checklist: Patient identified, Emergency Drugs available, Suction available and Patient being monitored Patient Re-evaluated:Patient Re-evaluated prior to induction Oxygen Delivery Method: Circle system utilized Preoxygenation: Pre-oxygenation with 100% oxygen Induction Type: IV induction Ventilation: Mask ventilation without difficulty Laryngoscope Size: Mac and 4 Grade View: Grade I Tube type: Oral Tube size: 7.5 mm Number of attempts: 1 Airway Equipment and Method: Stylet Placement Confirmation: ETT inserted through vocal cords under direct vision, positive ETCO2 and breath sounds checked- equal and bilateral Secured at: 21 cm Tube secured with: Tape Dental Injury: Teeth and Oropharynx as per pre-operative assessment

## 2024-05-26 ENCOUNTER — Encounter (HOSPITAL_COMMUNITY): Payer: Self-pay | Admitting: Orthopaedic Surgery

## 2024-06-24 ENCOUNTER — Other Ambulatory Visit: Payer: Self-pay | Admitting: Cardiovascular Disease

## 2024-08-24 ENCOUNTER — Other Ambulatory Visit: Payer: Self-pay | Admitting: Cardiovascular Disease

## 2024-08-25 NOTE — Progress Notes (Unsigned)
  Electrophysiology Office Follow up Visit Note:    Date:  08/26/2024   ID:  [name] [name], DOB 28-May-1961, MRN [id number]  PCP:  Patient, No Pcp Per  [facility] Cardiologist:  Maude Emmer, MD  [facility] Electrophysiologist:  OLE ONEIDA HOLTS, MD    Interval History:     [name] [name] is a 63 y.o. male who presents for a follow up visit.   The patient last saw Jodie May 23, 2024.  He has a history of obesity, chronic diastolic heart failure, TIA and atrial fibrillation with a prior catheter ablation.  His catheter ablation was in October 2023.  He was on flecainide  at the last appointment.  He had had recurrence of atrial fibrillation.  He presents to discuss redo catheter ablation.  He is doing quite well on flecainide .  He has not had any sustained recurrence of arrhythmia.  He is tolerating the medication without any target effects.  He continues to take Xarelto  for stroke prophylaxis.      Past medical, surgical, social and family history were reviewed.  ROS:   Please see the history of present illness.    All other systems reviewed and are negative.  EKGs/Labs/Other Studies Reviewed:    The following studies were reviewed today:          Physical Exam:    VS:  BP 118/86   Pulse (!) 54   Ht 5' 10 (1.778 m)   Wt 254 lb (115.2 kg)   SpO2 97%   BMI 36.45 kg/m     Wt Readings from Last 3 Encounters:  08/26/24 254 lb (115.2 kg)  05/25/24 255 lb 8 oz (115.9 kg)  05/23/24 255 lb 8 oz (115.9 kg)     GEN: no distress CARD: RRR, No MRG RESP: No IWOB. CTAB.      ASSESSMENT:    1. Persistent atrial fibrillation (HCC)    PLAN:    In order of problems listed above:  #Persistent atrial fibrillation #High risk med monitoring-flecainide  Prior catheter ablation September 25, 2022 during which the pulmonary veins were isolated. I discussed treatment options for his recurrent atrial fibrillation including conservative therapy, antiarrhythmic  drugs and catheter ablation.  Our antiarrhythmic drug options are somewhat limited because of drug interactions.  Given he is doing well and tolerating the flecainide , he would like to continue taking that medication which I think is very reasonable.  We will plan to see him back in 6 months.  He is to call us  if his symptoms change or his A-fib comes were frequent.  HAS-BLED score 5 Hypertension Yes  Abnormal renal and liver function (Dialysis, transplant, Cr >2.26 mg/dL /Cirrhosis or Bilirubin >2x Normal or AST/ALT/AP >3x Normal) Yes  Stroke Yes  Bleeding No  Labile INR (Unstable/high INR) No  Elderly (>65) Yes  Drugs or alcohol (>= 8 drinks/week, anti-plt or NSAID) Yes   CHA2DS2-VASc Score = 6  The patient's score is based upon: CHF History: 1 HTN History: 1 Diabetes History: 1 Stroke History: 2 Vascular Disease History: 0 Age Score: 1 Gender Score: 0  Follow-up 6 months with APP  Signed, OLE HOLTS, MD, [facility], [facility] 08/26/2024 9:48 AM    Electrophysiology [facility]

## 2024-08-26 ENCOUNTER — Encounter: Payer: Self-pay | Admitting: Cardiology

## 2024-08-26 ENCOUNTER — Ambulatory Visit: Attending: Cardiology | Admitting: Cardiology

## 2024-08-26 VITALS — BP 118/86 | HR 54 | Ht 70.0 in | Wt 254.0 lb

## 2024-08-26 DIAGNOSIS — I4819 Other persistent atrial fibrillation: Secondary | ICD-10-CM

## 2024-08-26 NOTE — Patient Instructions (Signed)

## 2024-09-05 ENCOUNTER — Ambulatory Visit: Admitting: Dermatology

## 2024-09-05 ENCOUNTER — Encounter: Payer: Self-pay | Admitting: Dermatology

## 2024-09-05 VITALS — BP 134/82 | HR 60

## 2024-09-05 DIAGNOSIS — L821 Other seborrheic keratosis: Secondary | ICD-10-CM | POA: Diagnosis not present

## 2024-09-05 DIAGNOSIS — Z940 Kidney transplant status: Secondary | ICD-10-CM

## 2024-09-05 DIAGNOSIS — Z1283 Encounter for screening for malignant neoplasm of skin: Secondary | ICD-10-CM | POA: Diagnosis not present

## 2024-09-05 DIAGNOSIS — D1801 Hemangioma of skin and subcutaneous tissue: Secondary | ICD-10-CM

## 2024-09-05 DIAGNOSIS — L814 Other melanin hyperpigmentation: Secondary | ICD-10-CM

## 2024-09-05 DIAGNOSIS — Z85828 Personal history of other malignant neoplasm of skin: Secondary | ICD-10-CM

## 2024-09-05 DIAGNOSIS — L905 Scar conditions and fibrosis of skin: Secondary | ICD-10-CM

## 2024-09-05 DIAGNOSIS — Z872 Personal history of diseases of the skin and subcutaneous tissue: Secondary | ICD-10-CM

## 2024-09-05 DIAGNOSIS — L570 Actinic keratosis: Secondary | ICD-10-CM

## 2024-09-05 DIAGNOSIS — L578 Other skin changes due to chronic exposure to nonionizing radiation: Secondary | ICD-10-CM | POA: Diagnosis not present

## 2024-09-05 DIAGNOSIS — D229 Melanocytic nevi, unspecified: Secondary | ICD-10-CM

## 2024-09-05 DIAGNOSIS — D849 Immunodeficiency, unspecified: Secondary | ICD-10-CM

## 2024-09-05 DIAGNOSIS — W908XXA Exposure to other nonionizing radiation, initial encounter: Secondary | ICD-10-CM | POA: Diagnosis not present

## 2024-09-05 NOTE — Patient Instructions (Signed)

## 2024-09-05 NOTE — Progress Notes (Signed)
 Follow-Up Visit   Subjective  [name] [name] is a 63 y.o. male who presents for the following: Skin Cancer Screening and Full Body Skin Exam  The patient presents for Total-Body Skin Exam (TBSE) for skin cancer screening and mole check. The patient has spots, moles and lesions to be evaluated, some may be new or changing.  Accompanied by his wife.  The following portions of the chart were reviewed this encounter and updated as appropriate: medications, allergies, medical history  Review of Systems:  No other skin or systemic complaints except as noted in HPI or Assessment and Plan.  Objective  Well appearing patient in no apparent distress; mood and affect are within normal limits.  A full examination was performed including scalp, head, eyes, ears, nose, lips, neck, chest, axillae, abdomen, back, buttocks, bilateral upper extremities, bilateral lower extremities, hands, feet, fingers, toes, fingernails, and toenails. All findings within normal limits unless otherwise noted below.   Relevant physical exam findings are noted in the Assessment and Plan.  Mid Parietal Scalp Erythematous thin papules/macules with gritty scale.   Assessment & Plan   SKIN CANCER SCREENING PERFORMED TODAY.  ACTINIC DAMAGE - Chronic condition, secondary to cumulative UV/sun exposure - diffuse scaly erythematous macules with underlying dyspigmentation - Recommend daily broad spectrum sunscreen SPF 30+ to sun-exposed areas, reapply every 2 hours as needed.  - Staying in the shade or wearing long sleeves, sun glasses (UVA+UVB protection) and wide brim hats (4-inch brim around the entire circumference of the hat) are also recommended for sun protection.  - Call for new or changing lesions.   MELANOCYTIC NEVI - Tan-brown and/or pink-flesh-colored symmetric macules and papules - Benign appearing on exam today - Observation - Call clinic for new or changing moles - Recommend daily use of broad  spectrum spf 30+ sunscreen to sun-exposed areas.   LENTIGINES Exam: scattered tan macules Due to sun exposure Treatment Plan: Benign-appearing, observe. Recommend daily broad spectrum sunscreen SPF 30+ to sun-exposed areas, reapply every 2 hours as needed.  Call for any changes   HEMANGIOMA Exam: red papule(s) Discussed benign nature. Recommend observation. Call for changes.   SEBORRHEIC KERATOSIS - Stuck-on, waxy, tan-brown papules and/or plaques  - Benign-appearing - Discussed benign etiology and prognosis. - Observe - Call for any changes   HISTORY OF PRECANCEROUS ACTINIC KERATOSIS - site(s) of PreCancerous Actinic Keratosis clear today. 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  - Call if any new or changing lesions are noted between office visits  History of Kidney Transplant- High Risk of Skin Cancer- 01/13/2019- Immunosuppressed 2/2 to Anti Rejection Meds - Discussed that risk of skin cancer, especially of SCC, due to anti rejection medications (tacrolimus , prednisone , mycophenolate ) - Discussed that patient should be seen every 6-9 months - Agree to niacinamide 500 mg BID   AK (ACTINIC KERATOSIS) Mid Parietal  Scalp Destruction of lesion - Mid Parietal Scalp Complexity: simple   Destruction method:  cryotherapy   Informed consent: discussed and consent obtained   Timeout:  patient name, date of birth, surgical site, and procedure verified Outcome: patient tolerated procedure well with no complications   Post-procedure details: wound care instructions given    HISTORY OF KIDNEY TRANSPLANT   IMMUNOSUPPRESSION   SCAR   ACTINIC SKIN DAMAGE   SEBORRHEIC KERATOSES   CHERRY ANGIOMA   LENTIGINES   MULTIPLE BENIGN NEVI   Return in about 3 months (around 12/06/2024) for TBSE.  I, Darice Smock, CMA, am acting as scribe for RUFUS CHRISTELLA HOLY, MD.   Documentation: I have reviewed the above documentation for accuracy and completeness, and I agree with the above.  RUFUS CHRISTELLA HOLY, MD

## 2024-09-13 ENCOUNTER — Ambulatory Visit: Admitting: Nurse Practitioner

## 2024-09-13 ENCOUNTER — Encounter: Payer: Self-pay | Admitting: Nurse Practitioner

## 2024-09-13 VITALS — BP 138/88 | HR 52 | Temp 97.7°F | Ht 70.0 in | Wt 251.2 lb

## 2024-09-13 DIAGNOSIS — D849 Immunodeficiency, unspecified: Secondary | ICD-10-CM

## 2024-09-13 DIAGNOSIS — Z23 Encounter for immunization: Secondary | ICD-10-CM | POA: Diagnosis not present

## 2024-09-13 DIAGNOSIS — Z940 Kidney transplant status: Secondary | ICD-10-CM | POA: Diagnosis not present

## 2024-09-13 DIAGNOSIS — I150 Renovascular hypertension: Secondary | ICD-10-CM | POA: Diagnosis not present

## 2024-09-13 DIAGNOSIS — E1169 Type 2 diabetes mellitus with other specified complication: Secondary | ICD-10-CM

## 2024-09-13 DIAGNOSIS — I480 Paroxysmal atrial fibrillation: Secondary | ICD-10-CM

## 2024-09-13 DIAGNOSIS — E782 Mixed hyperlipidemia: Secondary | ICD-10-CM

## 2024-09-13 NOTE — Assessment & Plan Note (Signed)
 His paroxysmal atrial fibrillation is managed with flecainide  and Xarelto . Flecainide  effectively prevents atrial fibrillation episodes. Discussed the potential long-term use of flecainide  as an alternative to another ablation. Continue flecainide  and Xarelto  as prescribed. Follow up with Cardiology/EP as scheduled.

## 2024-09-13 NOTE — Progress Notes (Addendum)
 Leron Glance, NP-C Phone: [phone]  [name] [name] is a 63 y.o. male who presents today for transfer of care.   Discussed the use of AI scribe software for clinical note transcription with the patient, who gave verbal consent to proceed.  History of Present Illness   [name] [name] is a 63 year old male who presents for transfer of care.  He has a history of a kidney transplant in February 2020 from a living donor in [place]. He is under the care of multiple specialists including cardiology, nephrology, and dermatology, with regular follow-ups. He is not experiencing any new problems or concerns.  He has pre-diabetes with an A1c level of 6.3, monitored every three months by his nephrologist. He manages his condition through diet and exercise without diabetes medications. No symptoms of excessive thirst, excessive urination, or hypoglycemia.  His current medications include metoprolol , losartan, Lasix  (half a pill daily), Lipitor, flecainide , and Xarelto . Xarelto  is for atrial fibrillation, and flecainide  was initiated to prevent recurrence post-shoulder surgery. He takes Tylenol  PM nightly for sleep.  He underwent an ablation procedure by Dr. Cindie, resulting in reduced right lung function. Initially, he had difficulty bending over, which has improved over time. He is uncertain about full recovery of lung function.  His blood pressure is monitored by his cardiologist and nephrologist, with home readings around 120/70 mmHg. No chest pain, shortness of breath, dizziness, or edema.      Social History   Tobacco Use  Smoking Status Never   Passive exposure: Past  Smokeless Tobacco Never    Current Outpatient Medications on File Prior to Visit  Medication Sig Dispense Refill   atorvastatin  (LIPITOR) 40 MG tablet Take 1 tablet (40 mg total) by mouth daily. 90 tablet 3   diphenhydramine -acetaminophen  (TYLENOL  PM) 25-500 MG TABS tablet Take 2 tablets by mouth at bedtime.      ENVARSUS  XR 1 MG TB24 Take 5 mg by mouth in the morning.     flecainide  (TAMBOCOR ) 50 MG tablet Take 1 tablet (50 mg total) by mouth 2 (two) times daily. 180 tablet 3   furosemide  (LASIX ) 40 MG tablet Take 20 mg by mouth daily. Prescribed 01/30/23 Dr Issac (nephrology)     losartan (COZAAR) 25 MG tablet Take 25 mg by mouth daily.     magnesium  oxide (MAG-OX) 400 MG tablet Take 400 mg by mouth 2 (two) times daily. Lunch and supper     metoprolol  succinate (TOPROL -XL) 25 MG 24 hr tablet Take 1 tablet (25 mg total) by mouth 2 (two) times daily. 60 tablet 6   mycophenolate  (MYFORTIC ) 180 MG EC tablet Take 540 mg by mouth 2 (two) times daily.     niacinamide 500 MG tablet Take 500 mg by mouth 2 (two) times daily with a meal.     omeprazole (PRILOSEC) 20 MG capsule Take 20 mg by mouth daily as needed (Heartburn).     ondansetron  (ZOFRAN ) 4 MG tablet Take 1 tablet (4 mg total) by mouth every 8 (eight) hours as needed for nausea or vomiting. 20 tablet 0   predniSONE  (DELTASONE ) 5 MG tablet Take 5 mg by mouth in the morning.     sulfamethoxazole -trimethoprim  (BACTRIM ) 400-80 MG tablet Take 1 tablet by mouth every Monday, Wednesday, and Friday. In the morning     XARELTO  20 MG TABS tablet Take 1 tablet (20 mg total) by mouth daily with supper. 90 tablet 3   Current Facility-Administered Medications on File Prior to Visit  Medication Dose  Route Frequency Provider Last Rate Last Admin   sodium chloride  flush (NS) 0.9 % injection 3 mL  3 mL Intravenous Q12H Delford Maude BROCKS, MD         ROS see history of present illness  Objective  Physical Exam Vitals:   09/13/24 1059  BP: 138/88  Pulse: (!) 52  Temp: 97.7 F (36.5 C)  SpO2: 97%    BP Readings from Last 3 Encounters:  09/13/24 138/88  09/05/24 134/82  08/26/24 118/86   Wt Readings from Last 3 Encounters:  09/13/24 251 lb 3.2 oz (113.9 kg)  08/26/24 254 lb (115.2 kg)  05/25/24 255 lb 8 oz (115.9 kg)    Physical Exam Constitutional:       General: He is not in acute distress.    Appearance: Normal appearance.  HENT:     Head: Normocephalic.  Cardiovascular:     Rate and Rhythm: Normal rate and regular rhythm.     Heart sounds: Normal heart sounds.  Pulmonary:     Effort: Pulmonary effort is normal.     Breath sounds: Normal breath sounds.  Skin:    General: Skin is warm and dry.  Neurological:     General: No focal deficit present.     Mental Status: He is alert.  Psychiatric:        Mood and Affect: Mood normal.        Behavior: Behavior normal.      Assessment/Plan: Please see individual problem list.  Paroxysmal A-fib (HCC) Assessment & Plan: His paroxysmal atrial fibrillation is managed with flecainide  and Xarelto . Flecainide  effectively prevents atrial fibrillation episodes. Discussed the potential long-term use of flecainide  as an alternative to another ablation. Continue flecainide  and Xarelto  as prescribed. Follow up with Cardiology/EP as scheduled.    Status post living-donor kidney transplantation Assessment & Plan: He is status post kidney transplant from February 2020 and remains on immunosuppression. He attends regular nephrology follow-ups every three months. Administer a high-dose flu shot today and continue regular nephrology follow-ups.   Renovascular hypertension Assessment & Plan: His blood pressure is well-controlled at home, typically around 120/70 mmHg. Current medications include metoprolol , losartan, and Lasix . Continue current medications and monitor blood pressure regularly at home. Follow up with Cardiology as scheduled.    Type 2 diabetes mellitus with other specified complication, without long-term current use of insulin (HCC) Assessment & Plan: Last A1c of 6.3%, managed with diet and exercise. A1c remains stable with lifestyle modifications. Continue monitoring A1c every three months with nephrology and emphasize diet and exercise to maintain or reduce A1c.   Mixed  hyperlipidemia Assessment & Plan: His mixed hyperlipidemia is managed with Lipitor. Continue Lipitor as prescribed.    Immunosuppression Assessment & Plan: Continue current medication regimen. Follow up with Nephrology as scheduled.    Need for influenza vaccination -     Flu vaccine HIGH DOSE PF(Fluzone Trivalent)      Return in about 6 months (around 03/14/2025) for Follow up.   Leron Glance, NP-C [facility]

## 2024-09-13 NOTE — Assessment & Plan Note (Signed)
 Continue current medication regimen. Follow up with Nephrology as scheduled.

## 2024-09-13 NOTE — Assessment & Plan Note (Signed)
 His blood pressure is well-controlled at home, typically around 120/70 mmHg. Current medications include metoprolol , losartan, and Lasix . Continue current medications and monitor blood pressure regularly at home. Follow up with Cardiology as scheduled.

## 2024-09-13 NOTE — Assessment & Plan Note (Signed)
 Last A1c of 6.3%, managed with diet and exercise. A1c remains stable with lifestyle modifications. Continue monitoring A1c every three months with nephrology and emphasize diet and exercise to maintain or reduce A1c.

## 2024-09-13 NOTE — Assessment & Plan Note (Signed)
 He is status post kidney transplant from February 2020 and remains on immunosuppression. He attends regular nephrology follow-ups every three months. Administer a high-dose flu shot today and continue regular nephrology follow-ups.

## 2024-09-13 NOTE — Assessment & Plan Note (Signed)
 His mixed hyperlipidemia is managed with Lipitor. Continue Lipitor as prescribed.

## 2024-12-07 ENCOUNTER — Ambulatory Visit: Admitting: Dermatology

## 2024-12-07 ENCOUNTER — Encounter: Payer: Self-pay | Admitting: Dermatology

## 2024-12-07 DIAGNOSIS — D229 Melanocytic nevi, unspecified: Secondary | ICD-10-CM

## 2024-12-07 DIAGNOSIS — D1801 Hemangioma of skin and subcutaneous tissue: Secondary | ICD-10-CM | POA: Diagnosis not present

## 2024-12-07 DIAGNOSIS — W908XXA Exposure to other nonionizing radiation, initial encounter: Secondary | ICD-10-CM | POA: Diagnosis not present

## 2024-12-07 DIAGNOSIS — L578 Other skin changes due to chronic exposure to nonionizing radiation: Secondary | ICD-10-CM | POA: Diagnosis not present

## 2024-12-07 DIAGNOSIS — Z85828 Personal history of other malignant neoplasm of skin: Secondary | ICD-10-CM

## 2024-12-07 DIAGNOSIS — Z940 Kidney transplant status: Secondary | ICD-10-CM

## 2024-12-07 DIAGNOSIS — L570 Actinic keratosis: Secondary | ICD-10-CM

## 2024-12-07 DIAGNOSIS — L821 Other seborrheic keratosis: Secondary | ICD-10-CM

## 2024-12-07 DIAGNOSIS — L814 Other melanin hyperpigmentation: Secondary | ICD-10-CM | POA: Diagnosis not present

## 2024-12-07 DIAGNOSIS — Z1283 Encounter for screening for malignant neoplasm of skin: Secondary | ICD-10-CM | POA: Diagnosis not present

## 2024-12-07 DIAGNOSIS — Z86007 Personal history of in-situ neoplasm of skin: Secondary | ICD-10-CM

## 2024-12-07 NOTE — Progress Notes (Signed)
 "  Total Body Skin Exam (TBSE) Visit  History of Present Illness [name] [name] is a 64 year old male who presents for follow up   He has no specific concerns about his skin at this time and mentions that his overall health is stable. He takes precautions to protect his skin by wearing long sleeves, gloves, and a hat when outdoors. He is on a vitamin B3 supplement to help reduce the risk of skin cancer, which has been approved by his nephrologist.  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 Before the procedure, his lung function was normal, but post-procedure, he experienced difficulty breathing when lying down. Extensive testing, including x-rays and breathing exercises, were performed after the procedure. His diaphragm is now positioned higher, affecting his breathing capacity. Despite these challenges, he remains active but notes a reduction in his ability to perform certain activities. He is currently on medication for his heart condition, which he finds effective.  He has undergone numerous surgeries, including a shoulder replacement in June of the previous year, which he describes as the most painful surgery he has experienced. He has had approximately thirty surgeries in total, all due to medical necessity.  Patient presents today for follow up visit for TBSE. Patient was last evaluated on 09/05/2024 . Patient denies medication changes. Patient reports he  does not have spots, moles and lesions of concern to be evaluated. Patient reports throughout his lifetime he  has had moderate sun exposure. Currently, patient reports if he  has excessive sun exposure, he  does apply sunscreen and/or wears protective coverings.   Father has history of melanoma. Patient has history of squamous cell carcinoma.  Patient is accompanied by his wife.    The following portions of the chart were reviewed  this encounter and updated as appropriate: medications, allergies, medical history  Review of Systems:  No other skin or systemic complaints except as noted in HPI or Assessment and Plan.  Objective  Well appearing patient in no apparent distress; mood and affect are within normal limits.  A full examination was performed including scalp, head, eyes, ears, nose, lips, neck, chest, axillae, abdomen, back, buttocks, bilateral upper extremities, bilateral lower extremities, hands, feet, fingers, toes, fingernails, and toenails. All findings within normal limits unless otherwise noted below.   Relevant physical exam findings are noted in the Assessment and Plan.  Left Lower Leg - Posterior Erythematous thin papules/macules with gritty scale.   Assessment & Plan   LENTIGINES, SEBORRHEIC KERATOSES, HEMANGIOMAS - Benign normal skin lesions - Benign-appearing - Call for any changes  MELANOCYTIC NEVI - Tan-brown and/or pink-flesh-colored symmetric macules and papules - Benign appearing on exam today - Observation - Call clinic for new or changing moles - Recommend daily use of broad spectrum spf 30+ sunscreen to sun-exposed areas.   ACTINIC DAMAGE - Chronic condition, secondary to cumulative UV/sun exposure - diffuse scaly erythematous macules with underlying dyspigmentation - Recommend daily broad spectrum sunscreen SPF 30+ to sun-exposed areas, reapply every 2 hours as needed.  - Staying in the shade or wearing long sleeves, sun glasses (UVA+UVB protection) and wide brim hats (4-inch brim around the entire circumference of the hat) are also recommended for sun protection.  - Call for new or changing lesions.  HISTORY OF SQUAMOUS CELL CARCINOMA OF THE SKIN- left temple and left dorsal hand.  - No evidence of recurrence today - No  lymphadenopathy - Recommend regular full body skin exams - Recommend daily broad spectrum sunscreen SPF 30+ to sun-exposed areas, reapply every 2 hours as  needed.  - Call if any new or changing lesions are noted between office visits  History of Kidney Transplant- High Risk of Skin Cancer- 01/13/2019- Immunosuppressed 2/2 to Anti Rejection Meds - Discussed that risk of skin cancer, especially of SCC, due to anti rejection medications (tacrolimus , prednisone , mycophenolate ) - Discussed that patient should be seen every 6-9 months - Agree to niacinamide 500 mg BID  SKIN CANCER SCREENING PERFORMED TODAY. AK (ACTINIC KERATOSIS) Left Lower Leg - Posterior - Destruction of lesion - Left Lower Leg - Posterior Complexity: simple   Destruction method: cryotherapy   Informed consent: discussed and consent obtained   Timeout:  patient name, date of birth, surgical site, and procedure verified Lesion destroyed using liquid nitrogen: Yes   Region frozen until ice ball extended beyond lesion: Yes   Outcome: patient tolerated procedure well with no complications   Post-procedure details: wound care instructions given    Return in about 3 months (around 03/07/2025) for FBSE.  LILLETTE Rollene Gobble, RN, am acting as scribe for RUFUS CHRISTELLA HOLY, MD .   Documentation: I have reviewed the above documentation for accuracy and completeness, and I agree with the above.  RUFUS CHRISTELLA HOLY, MD   "

## 2024-12-07 NOTE — Patient Instructions (Addendum)

## 2025-03-13 ENCOUNTER — Ambulatory Visit: Admitting: Dermatology
